# Patient Record
Sex: Female | Born: 1970 | Race: Black or African American | Hispanic: No | Marital: Married | Smoking: Never smoker
Health system: Southern US, Community
[De-identification: ages and names within clinical notes are randomized; demographics above are authoritative.]

## PROBLEM LIST (undated history)

## (undated) DIAGNOSIS — I10 Essential (primary) hypertension: Secondary | ICD-10-CM

## (undated) DIAGNOSIS — T884XXA Failed or difficult intubation, initial encounter: Secondary | ICD-10-CM

## (undated) DIAGNOSIS — F319 Bipolar disorder, unspecified: Secondary | ICD-10-CM

## (undated) DIAGNOSIS — G971 Other reaction to spinal and lumbar puncture: Secondary | ICD-10-CM

## (undated) DIAGNOSIS — F209 Schizophrenia, unspecified: Secondary | ICD-10-CM

## (undated) DIAGNOSIS — R112 Nausea with vomiting, unspecified: Secondary | ICD-10-CM

## (undated) DIAGNOSIS — Z9889 Other specified postprocedural states: Secondary | ICD-10-CM

## (undated) HISTORY — PX: HAND SURGERY: SHX662

## (undated) HISTORY — PX: ANKLE ARTHROSCOPY: SUR85

---

## 1997-11-27 ENCOUNTER — Emergency Department (HOSPITAL_COMMUNITY): Admission: EM | Admit: 1997-11-27 | Discharge: 1997-11-27 | Payer: Self-pay | Admitting: Emergency Medicine

## 1997-12-19 ENCOUNTER — Emergency Department (HOSPITAL_COMMUNITY): Admission: EM | Admit: 1997-12-19 | Discharge: 1997-12-19 | Payer: Self-pay

## 1998-10-11 ENCOUNTER — Emergency Department (HOSPITAL_COMMUNITY): Admission: EM | Admit: 1998-10-11 | Discharge: 1998-10-11 | Payer: Self-pay | Admitting: Emergency Medicine

## 1998-10-25 ENCOUNTER — Emergency Department (HOSPITAL_COMMUNITY): Admission: EM | Admit: 1998-10-25 | Discharge: 1998-10-25 | Payer: Self-pay | Admitting: Emergency Medicine

## 1998-11-09 ENCOUNTER — Encounter: Payer: Self-pay | Admitting: Orthopedic Surgery

## 1998-11-09 ENCOUNTER — Ambulatory Visit (HOSPITAL_COMMUNITY): Admission: RE | Admit: 1998-11-09 | Discharge: 1998-11-09 | Payer: Self-pay | Admitting: Orthopedic Surgery

## 1998-11-18 ENCOUNTER — Other Ambulatory Visit: Admission: RE | Admit: 1998-11-18 | Discharge: 1998-11-18 | Payer: Self-pay | Admitting: Obstetrics and Gynecology

## 1999-01-14 ENCOUNTER — Encounter: Admission: RE | Admit: 1999-01-14 | Discharge: 1999-02-06 | Payer: Self-pay | Admitting: Anesthesiology

## 1999-07-02 ENCOUNTER — Ambulatory Visit (HOSPITAL_COMMUNITY): Admission: RE | Admit: 1999-07-02 | Discharge: 1999-07-02 | Payer: Self-pay | Admitting: Physician Assistant

## 1999-07-02 ENCOUNTER — Encounter: Payer: Self-pay | Admitting: Obstetrics and Gynecology

## 1999-08-04 ENCOUNTER — Inpatient Hospital Stay (HOSPITAL_COMMUNITY): Admission: EM | Admit: 1999-08-04 | Discharge: 1999-08-04 | Payer: Self-pay | Admitting: Obstetrics and Gynecology

## 1999-08-26 ENCOUNTER — Inpatient Hospital Stay (HOSPITAL_COMMUNITY): Admission: EM | Admit: 1999-08-26 | Discharge: 1999-08-31 | Payer: Self-pay | Admitting: Psychiatry

## 1999-09-30 ENCOUNTER — Other Ambulatory Visit: Admission: RE | Admit: 1999-09-30 | Discharge: 1999-10-09 | Payer: Self-pay | Admitting: Psychiatry

## 1999-10-19 ENCOUNTER — Observation Stay (HOSPITAL_COMMUNITY): Admission: RE | Admit: 1999-10-19 | Discharge: 1999-10-20 | Payer: Self-pay | Admitting: Obstetrics and Gynecology

## 2001-02-21 ENCOUNTER — Inpatient Hospital Stay (HOSPITAL_COMMUNITY): Admission: EM | Admit: 2001-02-21 | Discharge: 2001-02-24 | Payer: Self-pay | Admitting: Psychiatry

## 2001-03-08 ENCOUNTER — Encounter (INDEPENDENT_AMBULATORY_CARE_PROVIDER_SITE_OTHER): Payer: Self-pay | Admitting: Specialist

## 2001-03-08 ENCOUNTER — Observation Stay (HOSPITAL_COMMUNITY): Admission: RE | Admit: 2001-03-08 | Discharge: 2001-03-09 | Payer: Self-pay | Admitting: Obstetrics and Gynecology

## 2002-01-30 ENCOUNTER — Encounter: Admission: RE | Admit: 2002-01-30 | Discharge: 2002-01-30 | Payer: Self-pay | Admitting: *Deleted

## 2002-04-20 ENCOUNTER — Encounter: Payer: Self-pay | Admitting: Emergency Medicine

## 2002-04-20 ENCOUNTER — Emergency Department (HOSPITAL_COMMUNITY): Admission: EM | Admit: 2002-04-20 | Discharge: 2002-04-20 | Payer: Self-pay | Admitting: Emergency Medicine

## 2003-07-30 ENCOUNTER — Emergency Department (HOSPITAL_COMMUNITY): Admission: EM | Admit: 2003-07-30 | Discharge: 2003-07-30 | Payer: Self-pay | Admitting: Emergency Medicine

## 2008-08-02 ENCOUNTER — Ambulatory Visit: Payer: Self-pay | Admitting: Family Medicine

## 2008-08-02 ENCOUNTER — Inpatient Hospital Stay (HOSPITAL_COMMUNITY): Admission: EM | Admit: 2008-08-02 | Discharge: 2008-08-03 | Payer: Self-pay | Admitting: Emergency Medicine

## 2010-05-10 LAB — COMPREHENSIVE METABOLIC PANEL
ALT: 30 U/L (ref 0–35)
Alkaline Phosphatase: 27 U/L — ABNORMAL LOW (ref 39–117)
BUN: 12 mg/dL (ref 6–23)
CO2: 22 mEq/L (ref 19–32)
Chloride: 104 mEq/L (ref 96–112)
GFR calc non Af Amer: >60 mL/min (ref >60)
Glucose, Bld: 148 mg/dL — ABNORMAL HIGH (ref 70–99)
Potassium: 3.3 mEq/L — ABNORMAL LOW (ref 3.5–5.1)
Sodium: 136 mEq/L (ref 135–145)
Total Bilirubin: 1 mg/dL (ref 0.3–1.2)
Total Protein: 7.5 g/dL (ref 6.0–8.3)

## 2010-05-10 LAB — DIFFERENTIAL
Basophils Absolute: 0 10*3/uL (ref 0.0–0.1)
Basophils Relative: 0 % (ref 0–1)
Eosinophils Absolute: 0 10*3/uL (ref 0.0–0.7)
Monocytes Relative: 8 % (ref 3–12)
Neutro Abs: 4.3 10*3/uL (ref 1.7–7.7)
Neutrophils Relative %: 59 % (ref 43–77)

## 2010-05-10 LAB — URINALYSIS, ROUTINE W REFLEX MICROSCOPIC
Bilirubin Urine: NEGATIVE
Nitrite: NEGATIVE
Protein, ur: 30 mg/dL — AB
Urobilinogen, UA: 0.2 mg/dL (ref 0.0–1.0)

## 2010-05-10 LAB — URINE MICROSCOPIC-ADD ON

## 2010-05-10 LAB — POCT CARDIAC MARKERS
CKMB, poc: 1.2 ng/mL (ref 1.0–8.0)
Myoglobin, poc: 71.3 ng/mL (ref 12–200)
Troponin i, poc: <0.05 ng/mL (ref 0.00–0.09)

## 2010-05-10 LAB — CBC
HCT: 39.3 % (ref 36.0–46.0)
Hemoglobin: 13.8 g/dL (ref 12.0–15.0)
Platelets: 261 10*3/uL (ref 150–400)
RBC: 4.45 MIL/uL (ref 3.87–5.11)
RDW: 13.2 % (ref 11.5–15.5)
WBC: 7.2 10*3/uL (ref 4.0–10.5)
WBC: 7.6 10*3/uL (ref 4.0–10.5)

## 2010-05-10 LAB — BASIC METABOLIC PANEL
BUN: 10 mg/dL (ref 6–23)
Calcium: 9.2 mg/dL (ref 8.4–10.5)
GFR calc non Af Amer: >60 mL/min (ref >60)
Glucose, Bld: 107 mg/dL — ABNORMAL HIGH (ref 70–99)

## 2010-05-10 LAB — PREGNANCY, URINE: Preg Test, Ur: NEGATIVE

## 2010-06-16 NOTE — Consult Note (Signed)
NAMESHARNIECE, GIBBON              ACCOUNT NO.:  [id number]   MEDICAL RECORD NO.:  [id number]          PATIENT TYPE:  INP   LOCATION:  3034                         FACILITY:  [facility]   PHYSICIAN:  Pramod P. Pearlean Brownie, MD    DATE OF BIRTH:  Jun 27, 1970   DATE OF CONSULTATION:  08/02/2008  DATE OF DISCHARGE:                                 CONSULTATION   REFERRING PHYSICIAN:  Orlene Och, MD   CLINICAL HISTORY:  A 40 year old lady who developed sudden onset of leg  swelling, weak, and numb at 12:30 early this morning.  She tried to see  if the feeling would go away, would eventually she fell asleep.  She  woke later this morning with weakness and tremulousness more on the  right than the left with some perioral numbness as well as pain in the  back of her neck.  The symptoms have persisted since then.  She denies  any slurred speech, dysarthria, diplopia, headache, or vision problems.  She has no prior history of stroke, TIA, or seizures.  She has no  significant vascular risk factors.   She does have a past history of depression with suicidal attempts in  2003 and also history of ovarian fibroids and cyst.   PAST SURGICAL HISTORY:  Ovarian cyst.   REVIEW OF SYSTEMS:  Negative for recent fever, cough, chest pain,  diarrhea, or other illness.   FAMILY HISTORY:  Noncontributory.   PHYSICAL EXAMINATION:  GENERAL:  Anxious young obese African American  lady currently not in distress.  VITAL SIGNS:  Pulse rate 78 per minute and regular, respiratory rate 16  per minute, and distal pulses well felt.  HEAD:  Nontraumatic.  NECK:  Supple without bruit.  ENT:  Unremarkable.  CARDIAC:  Regular heart sounds.  No murmur or gallop.  LUNGS:  Clear to auscultation.  NEUROLOGIC:  She is pleasant, awake, alert, cooperative.  She appears  anxious.  She follows commands well.  Speech is normal and is not  pressured.  There is no aphasia or slurred speech.  Eye movements are  full range without  nystagmus.  Face is  symmetric.  Tongue is midline.  Motor system exam reveal no upper extremity drift; however, her effort  is extremely variable and there is mild subjective weakness on the  right, but this is not very consistent.  Fine finger movements are  symmetric.  She has mild finger-to-nose and knee-to-heel coordination  impairment on the right, but I doubt her effort.  She has mild action  tremor, which is very fine of both upper extremities, right more than  left as well as to a lesser extent the lower extremities.  Her gait was  not tested.   DATA REVIEW:  CT scan of the head done today reveals no acute  abnormalities.  CT angiogram of cranial neck reveal no evidence of  dissection or large vessel stenosis.   IMPRESSION:  A 40 year old lady with subjective weakness and tremors  more on the right than the left.  Of unclear etiology, I doubt this  presents a stroke and we will  check MRI scan to brain and cervical spine  to rule out  any brain lesions or compressive spinal cord lesion.  If this is ruled  out, the patient will be discharged after that.  I suspect she has  underlying psychosocial issues, which may be playing a role.  I  discussed with the patient and the boyfriend and answered questions.  The patient has been admitted to the medical service for the above  workup.           ______________________________  Sunny Schlein. Pearlean Brownie, MD     PPS/MEDQ  D:  08/02/2008  T:  08/03/2008  Job:  [id number]

## 2010-06-16 NOTE — H&P (Signed)
NAMEACASIA, SKILTON NO.:  [id number]   MEDICAL RECORD NO.:  [id number]          PATIENT TYPE:  INP   LOCATION:  3034                         FACILITY:  [facility]   PHYSICIAN:  Nestor Ramp, MD        DATE OF BIRTH:  Oct 23, 1970   DATE OF ADMISSION:  08/02/2008  DATE OF DISCHARGE:                              HISTORY & PHYSICAL   PRIMARY CARE PHYSICIAN:  Unassigned, the patient sees Dr. Charolette Forward  in [place], [place].   NEUROLOGIST:  Dr. Darlyne Russian in [place].   CHIEF COMPLAINT:  Right-sided weakness.   HISTORY OF PRESENT ILLNESS:  This is a 40 year old female presenting  with right-sided weakness.  She awoke from her sleep at 12:45 a.m. this  morning and could not turn over in bed because she felt weak in her arms  and legs.  She could move her arms at that time, but both arms felt  heavy.  She went back to sleep at that time but awoke to her alarm at  6:30 a.m. and could not get up out of bed because she could not move her  right arm and leg.  She also noted pins and needles feeling in her  right arm and leg.  She was able to move her left extremities, though  there were subjectively weak at that time.  She also reports periorbital  tingling, and head and neck trembling with neck pain at that time.  She  denies dysarthria.  She feels better now but still endorses some  weakness.  She reports no previous similar episodes.   REVIEW OF SYSTEMS:  Per HPI and negative for nausea, vomiting, diarrhea,  chest pain, shortness of breath, headache, fever, chills, dysuria,  urinary frequency, abdominal pain.  The patient is menstruating at this  time.   ALLERGIES:  No known drug allergies.   MEDICATIONS:  1. Triamterene and hydrochlorothiazide (75/50) 1 tablet p.o. daily.  2. Potassium chloride 10 mEq p.o. t.i.d.  3. Prozac 40 mg p.o. daily.  4. Temazepam 30 mg p.o. at bedtime.  5. Toprol-XL 100 mg p.o. daily.   PAST MEDICAL  HISTORY:  1. Concussion with seizure in 2004.  No subsequent seizures.  2. Headache, post concussive.  Her headaches occur approximately every      4 months.  3. Hypertension.  4. Depression.   FAMILY HISTORY:  Mother is alive with hypertension, diabetes and asthma.  Father is alive with diabetes, prostate cancer and coronary artery  disease with his first MI at age 58.  Siblings are alive and well.  There are no children.   SOCIAL HISTORY:  The patient lives alone in [place], [place],  is a Consulting civil engineer at [employer].  She uses alcohol  occasionally but denies any history of tobacco or illicit drug use.   PHYSICAL EXAMINATION:  VITAL SIGNS:  Temperature is 99.0, heart rate is  101-112, respirations 18-20, blood pressure 122-149/76-95, oxygen  saturation 97-98% on room air.  GENERAL:  She is alert and oriented and in no acute distress.  HEENT:  Extraocular muscles are intact and pupils are equally round and  reactive to light with moist mucous membranes.  CARDIO:  Regular rate and rhythm with no murmurs, rubs or gallops.  PULMONARY:  Clear to auscultation bilaterally with normal work of  breathing.  ABDOMEN:  She is obese but it is soft, nontender with normoactive bowel  sounds.  EXTREMITIES:  Nontender without edema.  NEUROLOGIC:  Cranial nerves II-XII are intact.  A 5/5 strength in all  four extremities decreased, but equal deep tendon reflexes in all four  extremities.   LABORATORY DATA AND RADIOLOGY:  CT of the head shows slight asymmetry in  size of cavernous and supraclinoid right internal artery without  evidence for focal stenosis or dissection, no acute intracranial  abnormality.  CTA of the neck shows a motion artifact at proximal right  internal carotid artery, focal stenosis may be present but it is  considered unlikely.  Correlation with carotid Doppler study maybe of  use for further evaluation.  Alternatively, conventional arteriogram   could be performed.  Artifactual filling defects in the left vertebral  artery and no vertebral lesions are identified.  Normal appearance of  the left carotid artery.   Sodium 136, potassium 3.3, chloride 104, bicarb 22, BUN 12, creatinine  0.84, glucose 148, calcium 9.2.  White blood count 7.2, hemoglobin 13.8,  hematocrit 39.3, platelets 264.  Total bilirubin 1.0, alkaline  phosphatase 27, AST 26, ALT 30, protein 7.5, albumin 4.1.  Point-of-care  cardiac enzymes are negative x1.  Urine pregnancy test is negative.  Urinalysis is significant for a specific gravity greater than 1.046,  ketones of 15, large blood and 30 protein, it is otherwise within normal  limits.   ASSESSMENT AND PLAN:  This is a 40 year old female presenting with right-  sided weakness.  1. Right-sided weakness.  No focal deficits on exam.  Normal CT of the      head and left carotid, right carotid with motion artifact.  MRI/MRA      is pending at this time.  Consider TIA versus localized seizure.      Neuro has been consulted and the plan will be per their      recommendations.  2. Hypokalemia.  Likely secondary to hydrochlorothiazide, as she is on      a very large dose at home.  She is on triamterene and potassium      supplement at home.  Replete potassium now and as necessary.  Check      daily electrolytes.  3. Depression.  Continue home dose of Prozac and Restoril.  4. Hypertension.  Blood pressure is elevated, continue home dose of      hydrochlorothiazide and triamterene as well as Toprol-XL.  5. Fluids, electrolytes, nutrition/gastrointestinal.  Heart healthy,      low-sodium diet.  Hep-Lock the IV fluids.  Urinalysis is consistent      with dehydration (ketones and high specific gravity), but the      patient appears well hydrated on clinical exam.  She did receive IV      fluids in the emergency department.  6. Hematuria likely spurious as the patient is menstruating.  7. Prophylaxis.  Heparin  8.  Disposition.  Pending workup of weakness.      Romero Belling, MD  Electronically Signed      Nestor Ramp, MD  Electronically Signed    MO/MEDQ  D:  08/03/2008  T:  08/03/2008  Job:  [id number]

## 2010-06-16 NOTE — Discharge Summary (Signed)
[name], Jenna Hill NO.:  [id number]   MEDICAL RECORD NO.:  [id number]          PATIENT TYPE:  INP   LOCATION:  3034                         FACILITY:  [facility]   PHYSICIAN:  Nestor Ramp, MD        DATE OF BIRTH:  1970-08-31   DATE OF ADMISSION:  08/02/2008  DATE OF DISCHARGE:  08/03/2008                               DISCHARGE SUMMARY   DISCHARGE DIAGNOSES:  1. Right-sided weakness.  2. Hypokalemia.  3. Depression.  4. Hypertension.   DISCHARGE MEDICATIONS:  1. Maxzide (75/50) 1 tablet p.o. daily.  2. Potassium chloride 10 mEq p.o. t.i.d.  3. Prozac 40 mg p.o. daily.  4. Restoril 30 mg p.o. at bedtime.  5. Toprol-XL 100 mg p.o. daily.   Note, there are no changes to the patient's medication list during this  hospitalization.   CONSULTS:  Neurology.   PROCEDURES:  A CT angiogram of the head and neck showed no acute  intracranial abnormality, but there was some motion artifact of this  study.   An MRA of the head and neck with and without contrast; and brain, with  and without contrast showed a motion degraded exam, but no acute infarct  and no abnormal intracranial enhancing lesions.  It did demonstrate flow  within portions of the internal carotid arteries and vertebral arteries.   SIGNIFICANT LABORATORIES:  Point of care cardiac enzymes are negative.  Urine pregnancy test is negative.  Urinalysis is significant for a  specific gravity greater than 1.046, ketones of 15, large blood and 30  protein.  Notably, the patient is menstruating at this time, which may  explain the blood in the urine.  She has no abdominal symptoms.   On day of discharge, the patient's CBC is within normal limits with a  white blood count of 7.6, a hemoglobin of 13.2, and a platelet count of  261.  Her basic metabolic panel likewise is within normal limits with  exception of a glucose of 107.  Her creatinine is normal at 0.86.   BRIEF HOSPITAL COURSE:  This is a 40 year old  female who is admitted  with right-sided weakness less than a day in duration.  She was  evaluated by the neurology team with studies as above.  At this time,  there is no clear cause for her symptoms.  They are attributed to just  transient symptoms that are possibly nonorganic and we are advised by  neuro to discharge the patient to home with no further workup.   1. Hypokalemia.  The patient was repleted with potassium for a initial      value of 3.3, and is normokalemic to 3.5 on discharge.  She is to      resume her home potassium supplementation and her triamterene.  2. Depression.  We continued the home dose of Prozac and Restoril.  3. Hypertension.  It was normal to moderately elevated during this      admission and the patient is continued on her home dose of      hydrochlorothiazide and triamterene with Toprol-XL.   DISCHARGE CONDITION:  The patient is discharged to home in stable  medical condition.  She is advised to follow up with her primary  neurologist, Dr. Everardo Beals in [place], [place] and her  primary care physician Dr. Manson Passey, [place].  The follow up should be within a week.      Romero Belling, MD  Electronically Signed      Nestor Ramp, MD  Electronically Signed    MO/MEDQ  D:  08/03/2008  T:  08/04/2008  Job:  [id number]

## 2010-06-19 NOTE — Discharge Summary (Signed)
[facility]  Patient:    Jenna Hill                       MRN: [id number] Adm. Date:  [id number] Disc. Date: [id number] Attending:  Marlyn Corporal Fabmy                           Discharge Summary  ADMISSION DIAGNOSES: Axis I:    1. Major depression, recurrent with suicidal ideas.            2. Post-traumatic stress disorder with some psychotic symptoms. Axis II:   Deferred. Axis III:  Hypertension. Axis IV:   Severe, social environment. Axis V:    Global assessment of functioning of 35 on admission and highest in            the past year being 70.  DISCHARGE DIAGNOSES: Axis I:    1. Major depression, recurrent with suicidal preoccupation.            2. Post-traumatic stress disorder            3. Psychotic disorder, not otherwise specified. Axis II:   Deferred. Axis III:  Hypertension. Axis IV:   Severe, to her social environment. Axis V:    Global assessment of functioning of 35 on admission and 60            on discharge.  BRIEF HISTORY:  This was the second admission for this 40 year old separated black female who has been feeling depressed for two weeks.  The patient came under psychiatric care at age 89 when she was hospitalized for three weeks. It was in [place].  Cannot remember what medication was prescribed at the time.  Precipitating stress, feeling bad, hospitalization with an argument she was having with her mother, following which she had attempted suicide by overdosing.  She has been stable emotionally since then until two weeks ago when she felt depressed.  Present depression precipitated by her mother leaving after a brief visit . The patient became melancholy and her depression escalated.  On the day prior to admission she had been seriously contemplating suicide.  At the time of her admission, she denied any psychotic symptoms.  She states she is afraid to drive or to talk to anybody.  She did become mildly paranoid in  that she would live by herself but started feeling the presence of others in the house, including her brother who died in an accident in 20.  PERTINENT PHYSICAL AND LABORATORY DATA:  The patients family physician is Dr. Heather Roberts.  Patient is hypertensive and her blood pressure on admission was 134/84.  She was maintained on Maxzide 1 daily throughout her hospitalization and her vital signs were acceptable and within normal range throughout.  Laboratory data included a urine drug screen which was positive for benzodiazepines.  Urinalysis was unremarkable.  Urine was turbid but was negative for leukocyte esterase.  Otherwise, the patient had been cleared physically by Dr. Orson Aloe prior to having been referred for psychiatric care.  HOSPITAL COURSE:  The patient was placed on a combination of Prozac 20 mg once daily.  Zyprexa 10 mg h.s. and lorazepam 1 mg 1/2 to 1 t.i.d. p.r.n.  In response the medication she did better, but continued to be emotionally withdrawn and avoid groups because she felt uncomfortable.  She talked about seeing shadows.  The patient tolerated her medication well and with some  benefit.  CONDITION ON DISCHARGE:  The patient slept well the night before discharge She was less paranoid but was still anxious.  She did not participate in groups because she claimed there were too many people in the groups.  However, she was not homicidal or suicidal and since she was not at risk, I felt she could be discharged and followed as an outpatient at partial hospitalization.  MEDICATION AND FOLLOW-UP:  The patient is discharge with prescriptions for Prozac 20 mg daily.  Zyprexa 10 mg h.s. and lorazepam 1 mg 1/2 to 1 t.i.d. She was advised not to drink any alcoholic beverages.  She was advised to call if her depression escalated or if she becomes psychotic. DD:  09/30/99 TD:  09/30/99 Job: [id number]

## 2010-06-19 NOTE — Op Note (Signed)
[facility]  Patient:    Jenna Hill, Jenna Hill Visit Number: [id number] MRN: [id number]          Service Type: DSU Location: [id number] Attending Physician:  Soledad Gerlach Dictated by:   Guy Sandifer Arleta Creek, M.D. Proc. Date: 03/08/01 Admit Date:  03/08/2001 Discharge Date: 03/09/2001                             Operative Report  PREOPERATIVE DIAGNOSES:       1. Ovarian cyst.                               2. Pelvic pain.                               3. Abnormal uterine bleeding.  POSTOPERATIVE DIAGNOSES:      1. Endometrial polyp.                               2. Pelvic adhesions.                               3. Uterine leiomyomata.  PROCEDURE:                    Laparoscopy with lysis of adhesions and ablation of uterine leiomyomata, hysteroscopy, dilatation and curettage with removal of endometrial polyp.  SURGEON:                      Guy Sandifer. Arleta Creek, M.D.  ANESTHESIA:                   General with endotracheal intubation.  ESTIMATED BLOOD LOSS:         Less than 50 cc.  INPUT AND OUTPUT:             Sorbitol distending media 20 cc deficit.  INDICATIONS AND CONSENT:      This patient is a 40 year old married black female with a persistent right ovarian cyst and abnormal uterine bleeding and pelvic pain as detailed in the history and physical.  Laparoscopy and hysteroscopy is discussed with the patient.  Potential risks and complications have been discussed including, but not limited to, infection, uterine perforation, bowel, bladder, ureteral damage, bleeding requiring transfusion of blood products with possible transfusion reaction, HIV and hepatitis acquisition, DVT, PE, pneumonia, hysterectomy, and laparotomy.  All questions have been answered and consent is signed on the chart.  FINDINGS:                     The endometrial cavity contains a 1-2 cm polypoid structure arising from the left posterior endometrial canal.   The right fallopian tube ostia is normal.  The left fallopian tube ostia is identified, but contains some small ciliary structures.  The remainder of the cavity is without abnormal vessels or structures.  Abdominally the upper abdomen is normal.  The appendix is normal.  The uterus is mildly enlarged. There is a 5 mm subserosal leiomyoma on the right anterior uterine fundus and a 1 cm subserosal fibroid on the left posterior lower uterine segments.  The right ovary is normal in appearance.  There is a prominent venous plexus below the ovary at the point where it joins the mesosalpinx.  The ovary itself is without apparent lesion.  The right fallopian tube fimbria are delicate and regular.  The left ovary is normal.  The left fimbria are somewhat blunted. There is no obvious evidence of endometriosis in the posterior cul-de-sac, pelvic side walls, or anterior cul-de-sac.  PROCEDURE:                    Patient is taken to the operating room, placed in the dorsal supine position where general anesthesia is induced via endotracheal intubation.  She was then placed in the dorsal lithotomy position where she is prepped abdominally and vaginally, bladder straight catheterized, and she is draped in a sterile fashion.  Bivalve speculum was placed in the vagina and anterior cervical lip is injected with 1% Xylocaine and grasped with a single tooth tenaculum.  A paracervical block is then placed with 1% Xylocaine at the 2, 4, 5, 7, 8, and 10 oclock positions with 1% Xylocaine. Cervix is gently progressively dilated to a 21 Pratt dilator.  Diagnostic hysteroscope is placed in the cervical canal and advanced under direct visualization using Sorbitol distending media.  The above findings are noted. The hysteroscope is withdrawn.  Randall Stone forceps are then placed in the endometrial cavity and the polyp is easily withdrawn.  Sharp curettage is carried out.  Reinspection with the hysteroscope reveals  that the polyp has been removed in its entirety and no other abnormal structures are again noted. The single tooth tenaculum is replaced with the Hulka tenaculum and attention is turned to the abdomen.  A small infraumbilical incision is made and the 10-11 disposable trocar sleeve was placed on the first attempt without difficulty.  Placement is verified with the laparoscope and no damage to the surrounding structures is noted.  Pneumoperitoneum is induced and a small suprapubic incision is made and a 5 mm nondisposable trocar sleeve is placed under direct visualization without difficulty.  The above findings are noted. It should also be noted there was a band of adhesion from the epiploicae of the bowel wrapping around the left utero-ovarian ligament to the posterior cul-de-sac.  This came down bluntly without difficulty.  Good hemostasis was noted.  The subserosal leiomyoma are cauterized with bipolar cautery.  Some filmy changes along the left uterosacral ligament which are not clearly consistent with endometriosis are superficially cauterized.  This is well away from the course of the left ureter.  The suprapubic trocar sleeve is then removed.  Pneumoperitoneum is reduced and no bleeding is noted from any site. Pneumoperitoneum is completely reduced.  A 2-0 Vicryl suture is used to close the subcutaneous layers of the umbilical incision with a single stitch.  Great care is taken not to pick up any underlying structures.  The incisions are injected with 0.5% plain Marcaine.  The skin incisions are then closed with Dermabond.  All counts are correct.  Hulka tenaculum is removed and no bleeding is noted.  The patient is taken to the recovery room in stable condition. Dictated by:   Guy Sandifer Arleta Creek, M.D. Attending Physician:  Soledad Gerlach DD:  03/08/01 TD:  03/09/01 Job: [id number]

## 2010-06-19 NOTE — H&P (Signed)
[facility]  Patient:    Jenna Hill, Jenna Hill Visit Number: [id number] MRN: [id number]          Service Type: Attending:  Guy Sandifer. Arleta Creek, M.D. Dictated by:   Guy Sandifer Arleta Creek, M.D. Adm. Date:  03/08/01                           History and Physical  CHIEF COMPLAINT:              Right ovarian cyst and abnormal uterine bleeding.  HISTORY OF PRESENT ILLNESS:   This patient is a 40 year old married black female, G4, P0, with a history of pelvic pain and pelvic abdominal adhesions and a questionable history of endometriosis, who underwent ultrasound on December 05, 2000, for pelvic pain.  At that time the uterus measured 7.5 x 3.85 x 4.74 cm with one intramural fibroid noted measuring 1.7 cm in largest dimension.  She was also found to have a 3.2-cm cystic structure consistent with possible endometrioma versus hemorrhagic cyst.  The cyst remained present on two successive ultrasounds, the last one being on March 01, 2001, where it again was noted.  She continues to have some pelvic pain.  She reportedly had endometriosis on laparoscopies in the past.  On laparoscopy in September 2001, she was noted to have pelvic adhesions. Finally, she has reportedly failed to have withdrawal bleeding from Provera in the recent months.  Thyroid studies and prolactin level were normal in October of 2001.  Pregnancy tests have been repeatedly negative.  She is without vasomotor symptoms.  She is being admitted for hysteroscopy, D&C and laparoscopy with possible ovarian cystectomy and possible unilateral salpingo-oophorectomy.  PAST MEDICAL HISTORY: 1. Depression with recent admission to the [facility].  Her    psychiatrist, Dr. Jamas Lav, fells that she is suitable for surgery    from her standpoint and is capable of giving informed consent. 2. Endometriosis as above. 3. History of hypertension. 4. Blood transfusion in 1995.  PAST SURGICAL  HISTORY:        Laparoscopy x3.  OBSTETRIC HISTORY:            Pregnancy termination x2.  Spontaneous miscarriage x2.  FAMILY HISTORY:               Multiple births in maternal grandmother and brothers, diabetes in father, stomach cancer in maternal grandmother, heart disease in father.  SOCIAL HISTORY:               The patient denies tobacco, alcohol or drug abuse.  MEDICATIONS: 1. Maxzide 75/50. 2. Seroquel 200 mg. 3. Ambien 10 mg. 4. Paxil CR 12.5 mg. 5. Risperdal 3 mg.  ALLERGIES:                    No known drug allergies.  REVIEW OF SYSTEMS:            Negative except as above.  PHYSICAL EXAMINATION:  VITAL SIGNS:                  Height 5 feet 4 inches, blood pressure 124/90.  HEENT:                        Without thyromegaly.  LUNGS:                        Clear to auscultation.  HEART:                        Regular rate and rhythm.  BACK:                         Without CVA tenderness.  BREASTS:                      With fibrocystic change without dominant mass, retraction or discharge.  ABDOMEN:                      Soft, nontender, without masses.  PELVIC EXAM:                  Vulva, vagina and cervix without lesion.  Uterus is normal size.  Right adnexa tender without palpable masses, left adnexa nontender without masses.  EXTREMITIES:                  Grossly within normal limits.  NEUROLOGICAL EXAM:            Grossly within normal limits.  ASSESSMENT: 1. Abnormal uterine bleeding. 2. Pelvic pain. 3. Right ovarian cyst.  PLAN:                         Laparoscopy, possible ovarian cystectomy, possible unilateral salpingo-oophorectomy, hysteroscopy, D&C. Dictated by:   Guy Sandifer Arleta Creek, M.D. Attending:  Guy Sandifer Arleta Creek, M.D. DD:  03/08/01 TD:  03/08/01 Job: [id number]

## 2010-06-19 NOTE — Op Note (Signed)
[facility]  Patient:    Jenna Hill, Jenna Hill                      MRN: [id number] Proc. Date: 10/19/99 Adm. Date:  [id number] Attending:  Cordelia Pen Ii                           Operative Report  PREOPERATIVE DIAGNOSIS:       Pelvic pain.  POSTOPERATIVE DIAGNOSIS:      Pelvic/abdominal adhesions.  PROCEDURE:                    Laparoscopy with lysis of adhesions and                               chromopertubation of fallopian tubes.  SURGEON:                      Guy Sandifer. Arleta Creek, M.D.  ANESTHESIA:                   General with endotracheal intubation.  ESTIMATED BLOOD LOSS:         Drops.  INDICATIONS/CONSENT:          This patient is a 40 year old black female with pelvic pain.  Details are dictated in the history and physical.  A laparoscopy with chromopertubation is discussed with the patient.  The possible risks and complications have been discussed including, but not limited to, infection, bowel, bladder, or ureteral damage, bleeding requiring transfusion of blood products with possible transfusion reaction, HIV, and hepatitis acquisition, DVT, and PE, and pneumonia.  All questions are answered and consent is signed and on the chart.  FINDINGS:                     There are two omental adhesions to the anterior abdominal wall above the level of the umbilicus, below the level of the liver. This forms a closed loop.  There is an adhesion from the left vesicouterine fold around the fallopian tube and utero-ovarian ligament.  The ovaries have a smooth capsule.  The fallopian tubes appear normal bilaterally.  The anterior and posterior cul-de-sacs are normal.  DESCRIPTION OF PROCEDURE:     The patient is taken to the operating room and placed in the dorsal supine position, where general anesthesia is induced via endotracheal intubation.  She was then placed in the dorsal lithotomy position where she is prepped abdominally and  vaginally.  A Foley catheter is used to drain the bladder.  The cervix is grasped with a single-tooth tenaculum.  An acorn tenaculum is then placed in the cervix for chromopertubation.  She is draped in a sterile fashion.  A small infraumbilical incision is made, and a 1011 disposable trocar sleeve is placed, and after elevating the anterior abdominal wall with towel clips on either side of the umbilicus.  Proper placement is verified and no damage to the surrounding structures is noted. Towel clips are removed.  The pneumoperitoneum is induced.  A small suprapubic incision is made and a 5 mm nondisposable trocar sleeve is placed under direct visualization without difficulty.  The above findings are noted.  The adhesions in the pelvis are first cauterized with bipolar cautery close to the insertion, and then taken down sharply.  Then the omental adhesions to the  anterior abdominal wall are again cauterized with bipolar cautery at their insertion onto the anterior abdominal wall and taken down sharply.  Good hemostasis is maintained.  During this process, there is a crack that develops in the umbilical disposable trocar sleeve.  The isufflator is then moved to the suprapubic trocar sleeve, and umbilical trocar sleeve is removed.  A new 1011 disposable trocar sleeve is placed bluntly without cocking the sleeve. Placement is again verified and then insufflation is resume through the umbilical trocar sleeve.  Irrigation is carried out.  Chromopertubation of the fallopian tubes is then done.  The right fallopian tube has prompt fill and spill.  There is no apparent fill on the left side.  Excess fluid is removed. The inferior trocar sleeve is removed.  The pneumoperitoneum is reduced and no bleeding is noted from any site.  The pneumoperitoneum is then completely reduced and the umbilical trocar sleeve is removed.  The umbilical incision is first closed with a #0 Vicryl suture in the deeper  underlying layers, with care being taken not to pick up any underlying structure.  The skin incisions are closed with subcuticular #3-0 Vicryl suture.  The incisions are injected with 0.5% plain Marcaine.  Dressings are applied.  The single-tooth and acorn tenaculums are removed, and no bleeding is noted.  All counts are correct.  The patient is awakened and taken to the recovery room in stable condition. DD:  10/19/99 TD:  10/20/99 Job: [id number]

## 2010-06-19 NOTE — Discharge Summary (Signed)
[facility]  Patient:    Jenna Hill, Jenna Hill Visit Number: [id number] MRN: [id number]          Service Type: DSU Location: [id number] Attending Physician:  Soledad Gerlach Dictated by:   Guy Sandifer Arleta Creek, M.D. Admit Date:  03/08/2001 Discharge Date: 03/09/2001                             Discharge Summary  ADMISSION DIAGNOSES:          1. Ovarian cyst.                               2. Pelvic pain.                               3. Abnormal uterine bleeding.  DISCHARGE DIAGNOSES:          1. Endometrial polyp.                               2. Pelvic adhesions.                               3. Uterine leiomyomata.  PROCEDURES:                   On March 08, 2001, laparoscopy with lysis of adhesions, ablation of uterine leiomyomata, hysterectomy, and dilatation and curettage with removal of endometrial polyp.  REASON FOR ADMISSION:         This patient is a 40 year old, married, black female, G4, P0, with pelvic pain and ovarian cyst detailed in the history and physical.  She was admitted for the above procedure.  HOSPITAL COURSE:              She underwent the above procedure without complication.  She was kept in an observation bed secondary to not having a ride home or anyone to help in the first hours after surgery.  On the evening of surgery, she had good pain control and tolerating a regular diet.  The vital signs were stable and she was afebrile.  On the day of discharge, she again was afebrile with stable vital signs, tolerating a regular diet, and ambulating well with good pain control.  CONDITION ON DISCHARGE:       Good.  DIET:                         Regular as tolerated.  ACTIVITY:                     No lifting.  No vaginal entry.  FOLLOW-UP:                    In the office in two weeks.  DISCHARGE MEDICATIONS:        1. Vicodin, #10, one to two p.o. q.6h. p.r.n.                               2. Ibuprofen 800 mg  p.o. q.8h. p.r.n. Dictated by:   Guy Sandifer Arleta Creek, M.D. Attending Physician:  Cordelia Pen  Ii DD:  03/09/01 TD:  03/10/01 Job: [id number]

## 2012-03-18 ENCOUNTER — Emergency Department (HOSPITAL_COMMUNITY): Payer: Self-pay

## 2012-03-18 ENCOUNTER — Encounter (HOSPITAL_COMMUNITY): Payer: Self-pay | Admitting: Emergency Medicine

## 2012-03-18 ENCOUNTER — Emergency Department (HOSPITAL_COMMUNITY)
Admission: EM | Admit: 2012-03-18 | Discharge: 2012-03-18 | Disposition: A | Discharge status: Home / Self Care | Payer: Self-pay | Attending: Emergency Medicine | Admitting: Emergency Medicine

## 2012-03-18 DIAGNOSIS — Y9389 Activity, other specified: Secondary | ICD-10-CM | POA: Insufficient documentation

## 2012-03-18 DIAGNOSIS — Y9289 Other specified places as the place of occurrence of the external cause: Secondary | ICD-10-CM | POA: Insufficient documentation

## 2012-03-18 DIAGNOSIS — W19XXXA Unspecified fall, initial encounter: Secondary | ICD-10-CM

## 2012-03-18 DIAGNOSIS — Z7982 Long term (current) use of aspirin: Secondary | ICD-10-CM | POA: Insufficient documentation

## 2012-03-18 DIAGNOSIS — I10 Essential (primary) hypertension: Secondary | ICD-10-CM | POA: Insufficient documentation

## 2012-03-18 DIAGNOSIS — S79929A Unspecified injury of unspecified thigh, initial encounter: Secondary | ICD-10-CM | POA: Insufficient documentation

## 2012-03-18 DIAGNOSIS — W010XXA Fall on same level from slipping, tripping and stumbling without subsequent striking against object, initial encounter: Secondary | ICD-10-CM | POA: Insufficient documentation

## 2012-03-18 DIAGNOSIS — S0990XA Unspecified injury of head, initial encounter: Secondary | ICD-10-CM

## 2012-03-18 DIAGNOSIS — Z79899 Other long term (current) drug therapy: Secondary | ICD-10-CM | POA: Insufficient documentation

## 2012-03-18 DIAGNOSIS — W1809XA Striking against other object with subsequent fall, initial encounter: Secondary | ICD-10-CM | POA: Insufficient documentation

## 2012-03-18 DIAGNOSIS — M25551 Pain in right hip: Secondary | ICD-10-CM

## 2012-03-18 DIAGNOSIS — S79919A Unspecified injury of unspecified hip, initial encounter: Secondary | ICD-10-CM | POA: Insufficient documentation

## 2012-03-18 HISTORY — DX: Essential (primary) hypertension: I10

## 2012-03-18 MED ORDER — NAPROXEN 500 MG PO TABS: 500.0000 mg | ORAL_TABLET | Freq: Two times a day (BID) | ORAL | Status: DC

## 2012-03-18 MED ORDER — CYCLOBENZAPRINE HCL 10 MG PO TABS: 10.0000 mg | ORAL_TABLET | Freq: Two times a day (BID) | ORAL | Status: DC | PRN

## 2012-03-18 NOTE — ED Provider Notes (Signed)
History     CSN: [id number]  Arrival date & time 03/18/12  1744   First MD Initiated Contact with Patient 03/18/12 1812      Chief Complaint  Patient presents with  . .right sided pain, hit head\     (Consider location/radiation/quality/duration/timing/severity/associated sxs/prior treatment) HPI.... accidental fall last night on ice. Complains of pain in right hip and right inguinal area along with frontal forehead.  No loss of consciousness, neuro deficits, neck pain. Patient is ambulatory. Severity is mild to moderate. Palpation makes pain worse  Past Medical History  Diagnosis Date  . Hypertension     History reviewed. No pertinent past surgical history.  History reviewed. No pertinent family history.  History  Substance Use Topics  . Smoking status: Never Smoker   . Smokeless tobacco: Not on file  . Alcohol Use: Yes    OB History   Grav Para Term Preterm Abortions TAB SAB Ect Mult Living                  Review of Systems  All other systems reviewed and are negative.    Allergies  Codeine; Other; and Latex  Home Medications   Current Outpatient Rx  Name  Route  Sig  Dispense  Refill  . aspirin EC 325 MG tablet   Oral   Take 650 mg by mouth once.         . metoprolol succinate (TOPROL-XL) 100 MG 24 hr tablet   Oral   Take 100 mg by mouth every morning. Take with or immediately following a meal.         . potassium chloride (K-DUR) 10 MEQ tablet   Oral   Take 10 mEq by mouth 3 (three) times daily.         Marland Kitchen triamterene-hydrochlorothiazide (MAXZIDE) 75-50 MG per tablet   Oral   Take 1 tablet by mouth every morning.         . cyclobenzaprine (FLEXERIL) 10 MG tablet   Oral   Take 1 tablet (10 mg total) by mouth 2 (two) times daily as needed for muscle spasms.   20 tablet   0   . naproxen (NAPROSYN) 500 MG tablet   Oral   Take 1 tablet (500 mg total) by mouth 2 (two) times daily.   30 tablet   0     BP 144/99  Pulse 90   Temp(Src) 98.3 F (36.8 C) (Oral)  Resp 20  Ht 5\' 3"  (1.6 m)  Wt 180 lb (81.647 kg)  BMI 31.89 kg/m2  SpO2 100%  LMP 03/11/2012  Physical Exam  Nursing note and vitals reviewed. Constitutional: She is oriented to person, place, and time. She appears well-developed and well-nourished.  HENT:  Head: Normocephalic.  1 x 1 cm abrasion mid forehead  Eyes: Conjunctivae and EOM are normal. Pupils are equal, round, and reactive to light.  Neck: Normal range of motion. Neck supple.  Cardiovascular: Normal rate, regular rhythm and normal heart sounds.   Pulmonary/Chest: Effort normal and breath sounds normal.  Abdominal: Soft. Bowel sounds are normal.  Musculoskeletal: Normal range of motion.  Minimal tenderness right posterior pelvis and right inguinal area  Neurological: She is alert and oriented to person, place, and time.  Skin: Skin is warm and dry.  Psychiatric: She has a normal mood and affect.    ED Course  Procedures (including critical care time)  Labs Reviewed - No data to display Dg Pelvis 1-2 Views  03/18/2012  *  RADIOLOGY REPORT*  Clinical Data: Fall, pain  PELVIS - 1-2 VIEW  Comparison:  None.  Findings:  There is no evidence of pelvic fracture or diastasis. No other pelvic bone lesions are seen.  IMPRESSION: Negative.   Original Report Authenticated By: Davonna Belling, M.D.    Ct Head Wo Contrast  03/18/2012  *RADIOLOGY REPORT*  Clinical Data: The patient slipped on ice and hit forehead.  No loss of consciousness.  CT HEAD WITHOUT CONTRAST  Technique:  Contiguous axial images were obtained from the base of the skull through the vertex without contrast.  Comparison: MRI brain 08/02/2008.  Findings: There is no evidence for acute infarction, intracranial hemorrhage, mass lesion, hydrocephalus, or extra-axial fluid. There is no atrophy or white matter disease.  There is no significant scalp hematoma but there does appear to be a small subcutaneous 3 mm radiopaque density over the  right forehead in a paramedian location (image 17 series 2.  Correlate clinically.  It is unclear if this corresponds to the recent trauma or is preexisting. No susceptibility abnormality was seen on prior MR from 2010.  Calvarium is intact.  The sinuses and mastoids are clear. 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  IMPRESSION: No skull fracture or intracranial hemorrhage.  Tiny radiopaque density subcutaneously within the right forehead. It is unclear if this is preexisting or represents an acute foreign body.   Original Report Authenticated By: Davonna Belling, M.D.      1. Fall   2. Minor head injury   3. Right hip pain       MDM  Plain films of pelvis and CT head negative for fracture.  There is a possible radiopaque density in the right forehead.  Uncertain clinical correlation with fall last night.  This was discussed with the patient. She understands that we would not retrieve this tonight.        Donnetta Hutching, MD 03/18/12 [phone]

## 2012-03-18 NOTE — ED Notes (Signed)
Patient slipped on ice yesterday- hit forehead (negative LOC), right arm, right rib cage, right hand, and right hip.  Patient states she feels lethargic.  Patient is A/O x 4.

## 2012-03-20 ENCOUNTER — Emergency Department (HOSPITAL_COMMUNITY)
Admission: EM | Admit: 2012-03-20 | Discharge: 2012-03-20 | Disposition: A | Discharge status: Home / Self Care | Payer: Self-pay | Attending: Emergency Medicine | Admitting: Emergency Medicine

## 2012-03-20 ENCOUNTER — Emergency Department (HOSPITAL_COMMUNITY): Payer: Self-pay

## 2012-03-20 DIAGNOSIS — IMO0002 Reserved for concepts with insufficient information to code with codable children: Secondary | ICD-10-CM | POA: Insufficient documentation

## 2012-03-20 DIAGNOSIS — S0990XA Unspecified injury of head, initial encounter: Secondary | ICD-10-CM | POA: Insufficient documentation

## 2012-03-20 DIAGNOSIS — W010XXA Fall on same level from slipping, tripping and stumbling without subsequent striking against object, initial encounter: Secondary | ICD-10-CM | POA: Insufficient documentation

## 2012-03-20 DIAGNOSIS — Y929 Unspecified place or not applicable: Secondary | ICD-10-CM | POA: Insufficient documentation

## 2012-03-20 DIAGNOSIS — I10 Essential (primary) hypertension: Secondary | ICD-10-CM | POA: Insufficient documentation

## 2012-03-20 DIAGNOSIS — R209 Unspecified disturbances of skin sensation: Secondary | ICD-10-CM | POA: Insufficient documentation

## 2012-03-20 DIAGNOSIS — S060X9A Concussion with loss of consciousness of unspecified duration, initial encounter: Secondary | ICD-10-CM

## 2012-03-20 DIAGNOSIS — Z79899 Other long term (current) drug therapy: Secondary | ICD-10-CM | POA: Insufficient documentation

## 2012-03-20 DIAGNOSIS — R32 Unspecified urinary incontinence: Secondary | ICD-10-CM | POA: Insufficient documentation

## 2012-03-20 DIAGNOSIS — Y939 Activity, unspecified: Secondary | ICD-10-CM | POA: Insufficient documentation

## 2012-03-20 DIAGNOSIS — S060XAA Concussion with loss of consciousness status unknown, initial encounter: Secondary | ICD-10-CM | POA: Insufficient documentation

## 2012-03-20 LAB — CBC WITH DIFFERENTIAL/PLATELET
Basophils Absolute: 0 10*3/uL (ref 0.0–0.1)
Basophils Relative: 1 % (ref 0–1)
Eosinophils Absolute: 0.1 10*3/uL (ref 0.0–0.7)
Eosinophils Relative: 1 % (ref 0–5)
HCT: 37.8 % (ref 36.0–46.0)
Hemoglobin: 12.8 g/dL (ref 12.0–15.0)
Lymphocytes Relative: 40 % (ref 12–46)
Lymphs Abs: 2.6 10*3/uL (ref 0.7–4.0)
MCH: 29.1 pg (ref 26.0–34.0)
MCHC: 33.9 g/dL (ref 30.0–36.0)
MCV: 85.9 fL (ref 78.0–100.0)
Monocytes Absolute: 0.4 10*3/uL (ref 0.1–1.0)
Monocytes Relative: 6 % (ref 3–12)
Neutro Abs: 3.5 10*3/uL (ref 1.7–7.7)
Neutrophils Relative %: 53 % (ref 43–77)
Platelets: 376 10*3/uL (ref 150–400)
RBC: 4.4 MIL/uL (ref 3.87–5.11)
RDW: 13.1 % (ref 11.5–15.5)
WBC: 6.6 10*3/uL (ref 4.0–10.5)

## 2012-03-20 LAB — BASIC METABOLIC PANEL
BUN: 11 mg/dL (ref 6–23)
CO2: 25 mEq/L (ref 19–32)
Calcium: 8.8 mg/dL (ref 8.4–10.5)
Chloride: 102 mEq/L (ref 96–112)
Creatinine, Ser: 0.79 mg/dL (ref 0.50–1.10)
GFR calc Af Amer: >90 mL/min (ref >90)
GFR calc non Af Amer: >90 mL/min (ref >90)
Glucose, Bld: 105 mg/dL — ABNORMAL HIGH (ref 70–99)
Potassium: 3.1 mEq/L — ABNORMAL LOW (ref 3.5–5.1)
Sodium: 138 mEq/L (ref 135–145)

## 2012-03-20 MED ORDER — HYDROMORPHONE HCL PF 1 MG/ML IJ SOLN
1.0000 mg | Freq: Once | INTRAMUSCULAR | Status: AC
  Administered 2012-03-20: 1 mg via INTRAVENOUS
  Filled 2012-03-20: qty 1

## 2012-03-20 MED ORDER — DIAZEPAM 5 MG/ML IJ SOLN
5.0000 mg | Freq: Once | INTRAMUSCULAR | Status: AC
  Administered 2012-03-20: 5 mg via INTRAVENOUS
  Filled 2012-03-20: qty 2

## 2012-03-20 NOTE — ED Provider Notes (Signed)
History     CSN: [id number]  Arrival date & time 03/20/12  1439   First MD Initiated Contact with Patient 03/20/12 1556      Chief Complaint  Patient presents with  . Extremity Weakness    (Consider location/radiation/quality/duration/timing/severity/associated sxs/prior treatment) HPI Comments: 42 year old female who presents 48 hours after falling on the ice and striking her forehead on the ground. There were 2 successive falls over a very short time frame in a matter of seconds, initially she had a headache and was hurting in her pelvis. She was seen and had negative CT scanning of her head and pelvis at that time without intracranial hemorrhage or fractures. Since that time she has had 2 episodes of urinary incontinence which is very abnormal for her, she has had weakness of her right upper and right lower extremities which is mild, she is able to ambulate but notices decreased strength in those extremities. The symptoms are persistent, nothing seems to make it better or worse, not associated with difficulty breathing, no change in vision, no difficulty swallowing or speaking.  The history is provided by the patient and medical records.    Past Medical History  Diagnosis Date  . Hypertension     No past surgical history on file.  No family history on file.  History  Substance Use Topics  . Smoking status: Never Smoker   . Smokeless tobacco: Not on file  . Alcohol Use: Yes    OB History   Grav Para Term Preterm Abortions TAB SAB Ect Mult Living                  Review of Systems  All other systems reviewed and are negative.    Allergies  Codeine; Other; and Latex  Home Medications   Current Outpatient Rx  Name  Route  Sig  Dispense  Refill  . cyclobenzaprine (FLEXERIL) 10 MG tablet   Oral   Take 1 tablet (10 mg total) by mouth 2 (two) times daily as needed for muscle spasms.   20 tablet   0   . metoprolol succinate (TOPROL-XL) 100 MG 24 hr tablet    Oral   Take 100 mg by mouth every morning. Take with or immediately following a meal.         . naproxen (NAPROSYN) 500 MG tablet   Oral   Take 1 tablet (500 mg total) by mouth 2 (two) times daily.   30 tablet   0   . potassium chloride (K-DUR) 10 MEQ tablet   Oral   Take 30 mEq by mouth daily.          Marland Kitchen triamterene-hydrochlorothiazide (MAXZIDE) 75-50 MG per tablet   Oral   Take 1 tablet by mouth every morning.           BP 183/116  Pulse 88  Temp(Src) 97.7 F (36.5 C) (Oral)  Resp 24  SpO2 98%  LMP 03/11/2012  Physical Exam  Nursing note and vitals reviewed. Constitutional: She appears well-developed and well-nourished. No distress.  HENT:  Head: Normocephalic.  Mouth/Throat: Oropharynx is clear and moist. No oropharyngeal exudate.  Small abrasion to her central forehead, no masses, no swelling, no hematoma, no laceration  Eyes: Conjunctivae and EOM are normal. Pupils are equal, round, and reactive to light. Right eye exhibits no discharge. Left eye exhibits no discharge. No scleral icterus.  no facial tenderness, deformity, malocclusion or hemotympanum.  no battle's sign or racoon eyes.   Neck: Normal range  of motion. Neck supple. No JVD present. No thyromegaly present.  Cardiovascular: Normal rate, regular rhythm, normal heart sounds and intact distal pulses.  Exam reveals no gallop and no friction rub.   No murmur heard. Pulmonary/Chest: Effort normal and breath sounds normal. No respiratory distress. She has no wheezes. She has no rales.  Abdominal: Soft. Bowel sounds are normal. She exhibits no distension and no mass. There is no tenderness.  Musculoskeletal: Normal range of motion. She exhibits no edema and no tenderness.  No deformities or tenderness to any of the 4 extremities to palpation or range of motion of the joints.  Lymphadenopathy:    She has no cervical adenopathy.  Neurological: She is alert. Coordination normal.  4+ out of 5 strength in the  upper and lower right upper and right lower extremities. Left upper and lower extremities with normal strength. Decreased sensation to light touch and pinprick of the right upper and lower extremities diffusely. Neutral toes on Babinski, decreased reflexes at the bilateral knees, normal reflexes at the bilateral brachial radialis, speech is clear, extraocular movements and cranial nerves III through XII remain intact, no limb ataxia on finger-nose-finger testing  Skin: Skin is warm and dry. No rash noted. No erythema.  Psychiatric: She has a normal mood and affect. Her behavior is normal.    ED Course  Procedures (including critical care time)  Labs Reviewed  BASIC METABOLIC PANEL - Abnormal; Notable for the following:    Potassium 3.1 (*)    Glucose, Bld 105 (*)    All other components within normal limits  CBC WITH DIFFERENTIAL   Mr Cervical Spine Wo Contrast  03/20/2012  *RADIOLOGY REPORT*  Clinical Data: 42 year old female status post fall.  Right upper extremity weakness numbness and neck pain.  MRI CERVICAL SPINE WITHOUT CONTRAST  Technique:  Multiplanar and multiecho pulse sequences of the cervical spine, to include the craniocervical junction and cervicothoracic junction, were obtained according to standard protocol without intravenous contrast.  Comparison: Neck CTA 08/02/2008.  Findings: Straightening of cervical lordosis similar to the comparison. No marrow edema or evidence of acute osseous abnormality.  No abnormal cervical ligamentous signal.  Cervicomedullary junction is within normal limits.  Spinal cord signal is within normal limits at all visualized levels.  No evidence of cervical nerve root avulsion.  There is evidence of a left posterior thyroid nodule (series 6 image 22).  This was probably present in 2010 and appears similar. Other visible paraspinal soft tissues are within normal limits.  C2-C3:  Negative.  C3-C4:  Mild right uncovertebral hypertrophy.  No stenosis.  C4-C5:   Left greater than right uncovertebral hypertrophy.  Mild left C5 foraminal stenosis.  C5-C6:  Mild uncovertebral hypertrophy.  No stenosis.  C6-C7:  Mild circumferential disc bulge.  No stenosis.  C7-T1:  Mild disc bulge.  Mild facet hypertrophy.  No stenosis.  No upper thoracic spinal stenosis.  IMPRESSION: 1.  No acute injury identified.  No cervical spinal stenosis or neural impingement identified. 2.  Partially visible left thyroid nodule is grossly stable since 2010.  Consider nonemergent follow-up thyroid ultrasound.   Original Report Authenticated By: Erskine Speed, M.D.      1. Head injury   2. Concussion       MDM  The patient has focal neurologic deficits, right greater than left, has vital signs significant for hypertension which is likely related to her anxiety and pain. She has had some muscle cramping on the exam and has a history  of hypokalemia, check labs, MRI of the cervical spine, immobilized with a cervical collar.   MRI is negative for any cord injury or spinal fractures. The patient has been reassured, she was ambulatory into the emergency department, tolerating oral fluids and can be discharged safely with concussion discharge instructions.  She has severe anaphylaxis to codeine, she will not be given opiate medications to go home though she states she can take hydromorphone safely she can go home with anti-inflammatories and followup, resource list given.    Vida Roller, MD 03/20/12 2005

## 2012-03-20 NOTE — ED Notes (Signed)
Pt stated that was not feeling better fell on ice 03/17/12 hit head no she is c/o extremity shaking and h/a c/o weakness on rt side neg for code stroke

## 2012-03-20 NOTE — ED Notes (Signed)
Patient transported to MRI 

## 2012-03-20 NOTE — ED Notes (Signed)
Pt able to tolerate food.

## 2012-03-20 NOTE — ED Notes (Signed)
MD wants to know if pt can walk and eat before sending pt home.  Pt is currently eating peanut butter and crackers.

## 2012-08-04 ENCOUNTER — Encounter (HOSPITAL_COMMUNITY): Payer: Self-pay | Admitting: *Deleted

## 2012-08-04 ENCOUNTER — Emergency Department (HOSPITAL_COMMUNITY)
Admission: EM | Admit: 2012-08-04 | Discharge: 2012-08-04 | Disposition: A | Discharge status: Home / Self Care | Payer: Self-pay | Attending: Emergency Medicine | Admitting: Emergency Medicine

## 2012-08-04 DIAGNOSIS — R1310 Dysphagia, unspecified: Secondary | ICD-10-CM | POA: Insufficient documentation

## 2012-08-04 DIAGNOSIS — T7840XA Allergy, unspecified, initial encounter: Secondary | ICD-10-CM

## 2012-08-04 DIAGNOSIS — S30860A Insect bite (nonvenomous) of lower back and pelvis, initial encounter: Secondary | ICD-10-CM | POA: Insufficient documentation

## 2012-08-04 DIAGNOSIS — R0789 Other chest pain: Secondary | ICD-10-CM | POA: Insufficient documentation

## 2012-08-04 DIAGNOSIS — I10 Essential (primary) hypertension: Secondary | ICD-10-CM | POA: Insufficient documentation

## 2012-08-04 DIAGNOSIS — R000 Tachycardia, unspecified: Secondary | ICD-10-CM | POA: Insufficient documentation

## 2012-08-04 DIAGNOSIS — Z91040 Latex allergy status: Secondary | ICD-10-CM | POA: Insufficient documentation

## 2012-08-04 DIAGNOSIS — K122 Cellulitis and abscess of mouth: Secondary | ICD-10-CM | POA: Insufficient documentation

## 2012-08-04 DIAGNOSIS — Y929 Unspecified place or not applicable: Secondary | ICD-10-CM | POA: Insufficient documentation

## 2012-08-04 DIAGNOSIS — R21 Rash and other nonspecific skin eruption: Secondary | ICD-10-CM | POA: Insufficient documentation

## 2012-08-04 DIAGNOSIS — W57XXXA Bitten or stung by nonvenomous insect and other nonvenomous arthropods, initial encounter: Secondary | ICD-10-CM | POA: Insufficient documentation

## 2012-08-04 DIAGNOSIS — Y9389 Activity, other specified: Secondary | ICD-10-CM | POA: Insufficient documentation

## 2012-08-04 DIAGNOSIS — J029 Acute pharyngitis, unspecified: Secondary | ICD-10-CM | POA: Insufficient documentation

## 2012-08-04 MED ORDER — METOPROLOL TARTRATE 25 MG PO TABS
50.0000 mg | ORAL_TABLET | Freq: Once | ORAL | Status: AC
  Administered 2012-08-04: 50 mg via ORAL
  Filled 2012-08-04: qty 2

## 2012-08-04 MED ORDER — HYDROXYZINE HCL 50 MG/ML IM SOLN: 50.0000 mg | Freq: Four times a day (QID) | INTRAMUSCULAR | Status: DC | PRN

## 2012-08-04 MED ORDER — DIPHENHYDRAMINE HCL 50 MG/ML IJ SOLN
25.0000 mg | Freq: Once | INTRAMUSCULAR | Status: AC
  Administered 2012-08-04: 25 mg via INTRAVENOUS
  Filled 2012-08-04: qty 1

## 2012-08-04 MED ORDER — DEXAMETHASONE SODIUM PHOSPHATE 10 MG/ML IJ SOLN
10.0000 mg | Freq: Once | INTRAMUSCULAR | Status: AC
  Administered 2012-08-04: 10 mg via INTRAMUSCULAR
  Filled 2012-08-04: qty 1

## 2012-08-04 MED ORDER — PREDNISONE 20 MG PO TABS: ORAL_TABLET | ORAL | Status: DC

## 2012-08-04 MED ORDER — DIPHENHYDRAMINE HCL 50 MG/ML IJ SOLN
25.0000 mg | Freq: Once | INTRAMUSCULAR | Status: AC
  Administered 2012-08-04: 25 mg via INTRAMUSCULAR
  Filled 2012-08-04: qty 1

## 2012-08-04 MED ORDER — FAMOTIDINE IN NACL 20-0.9 MG/50ML-% IV SOLN
20.0000 mg | Freq: Once | INTRAVENOUS | Status: DC
  Filled 2012-08-04: qty 50

## 2012-08-04 MED ORDER — DIPHENHYDRAMINE HCL 25 MG PO TABS: 25.0000 mg | ORAL_TABLET | Freq: Four times a day (QID) | ORAL | Status: DC

## 2012-08-04 MED ORDER — FAMOTIDINE 20 MG PO TABS
20.0000 mg | ORAL_TABLET | Freq: Once | ORAL | Status: AC
  Administered 2012-08-04: 20 mg via ORAL
  Filled 2012-08-04: qty 1

## 2012-08-04 NOTE — ED Provider Notes (Signed)
History    CSN: [id number] Arrival date & time 08/04/12  1722  First MD Initiated Contact with Patient 08/04/12 1731     Chief Complaint  Patient presents with  . Insect Bite   (Consider location/radiation/quality/duration/timing/severity/associated sxs/prior Treatment) HPI Comments: 42 y/o female with a PMHx of HTN presents to the ED complaining of sore throat and difficulty swallowing after being bit by a bug yesterday. Patient states she was bit on the right side of her upper back, did not see the bug, and directly after became sore in the area and developed a pink rash on her arms and legs that was itchy, rash subsided overnight after taking benadryl. Shortly after the bite she also developed a sore throat with difficulty swallowing which did not subside with benadryl. Admits to chest tightness. `Denies new foods or any other exposures.  The history is provided by the patient.   Past Medical History  Diagnosis Date  . Hypertension    History reviewed. No pertinent past surgical history. No family history on file. History  Substance Use Topics  . Smoking status: Never Smoker   . Smokeless tobacco: Not on file  . Alcohol Use: Yes   OB History   Grav Para Term Preterm Abortions TAB SAB Ect Mult Living                 Review of Systems  HENT: Positive for sore throat and trouble swallowing.   Respiratory: Positive for chest tightness. Negative for shortness of breath, wheezing and stridor.   Skin: Positive for rash.  All other systems reviewed and are negative.    Allergies  Codeine; Other; and Latex  Home Medications   Current Outpatient Rx  Name  Route  Sig  Dispense  Refill  . metoprolol succinate (TOPROL-XL) 100 MG 24 hr tablet   Oral   Take 100 mg by mouth every morning. Take with or immediately following a meal.         . triamterene-hydrochlorothiazide (MAXZIDE) 75-50 MG per tablet   Oral   Take 1 tablet by mouth every morning.          BP 161/112   Pulse 112  Temp(Src) 98.3 F (36.8 C) (Oral)  SpO2 97% Physical Exam  Nursing note and vitals reviewed. Constitutional: She is oriented to person, place, and time. She appears well-developed and well-nourished. No distress.  HENT:  Head: Normocephalic and atraumatic.  Mouth/Throat: Uvula is midline and mucous membranes are normal. Edematous present. Posterior oropharyngeal edema present. No oropharyngeal exudate or posterior oropharyngeal erythema.  Airway patent.  Eyes: Conjunctivae are normal.  Neck: Normal range of motion. Neck supple. No tracheal deviation present.  Cardiovascular: Regular rhythm and normal heart sounds.  Tachycardia present.   Pulmonary/Chest: Effort normal and breath sounds normal. No stridor. No respiratory distress. She has no decreased breath sounds. She has no wheezes.  Musculoskeletal: Normal range of motion. She exhibits no edema.  Neurological: She is alert and oriented to person, place, and time.  Skin: Skin is warm and dry. No rash noted. She is not diaphoretic.  Psychiatric: She has a normal mood and affect. Her behavior is normal.    ED Course  Procedures (including critical care time) Labs Reviewed - No data to display No results found. 1. Allergic reaction, initial encounter   2. Uvulitis     MDM  Uvula swelling s/p bug bite yesterday. Airway patent, NAD. No rash present. IM decadron and benadryl, will re-assess. 7:10 PM Chest  heaviness improving. Still with throat pain, difficulty swallowing. Will give 50mg  IM vistaril, 20mg  pepcid. Patient discussed with Dr. Freida Busman who agrees with plan of care. 7:35 PM Vistaril on national backorder. Nurse had started IV, will give 25 mg benadryl IV. 8:10 PM Patient reports marked improvement of symptoms. Throat less sore, easier to swallow. She is able to swallow pepcid pill without difficulty. Uvula swelling decreasing. She is in NAD. Stable for discharge. Rx for prednisone, benadryl. Close return  precautions discussed. Patient states understanding of plan and is agreeable.   Trevor Mace, PA-C 08/04/12 2011

## 2012-08-04 NOTE — ED Notes (Signed)
Discharge instructions reviewed. Pt verbalized understanding.  

## 2012-08-04 NOTE — ED Notes (Signed)
Pt states she was bit by an insect on her back yesterday and began to have some cp, swelling, difficulty swallowing, and hives. Pt states today she still has some difficulty swallowing, and breathing. She states "feels like an elephant is sitting on my chest".

## 2012-08-04 NOTE — ED Notes (Addendum)
Pt c/o swelling and tightness in her throat since getting an insect bite yesterday. States had hives and SOB yesterday. Took some Benadryl without improvement. Today has worsening of throat tightness and c/o chest pain. Lungs clear. Pt transferred to POD A-8.

## 2012-08-04 NOTE — ED Notes (Signed)
The pt was stung or bitten by some type bug or insect yesterday on her anterior throat.  She had a rash yesterday and she has throat pain with difficulty swallowing.  Today the rash is gone but her throat hurts and she has difficulty swallwing

## 2012-08-06 NOTE — ED Provider Notes (Signed)
Medical screening examination/treatment/procedure(s) were performed by non-physician practitioner and as supervising physician I was immediately available for consultation/collaboration.  Jeffree Cazeau T Albertina Leise, MD 08/06/12 2038 

## 2012-10-05 ENCOUNTER — Encounter (HOSPITAL_COMMUNITY): Payer: Self-pay | Admitting: Anesthesiology

## 2012-10-05 ENCOUNTER — Emergency Department (HOSPITAL_COMMUNITY): Payer: Self-pay | Admitting: Anesthesiology

## 2012-10-05 ENCOUNTER — Emergency Department (HOSPITAL_COMMUNITY): Payer: Self-pay

## 2012-10-05 ENCOUNTER — Inpatient Hospital Stay (HOSPITAL_COMMUNITY)
Admission: EM | Admit: 2012-10-05 | Discharge: 2012-10-07 | DRG: 915 | Disposition: A | Discharge status: Home / Self Care | Payer: Self-pay | Attending: Pulmonary Disease | Admitting: Pulmonary Disease

## 2012-10-05 ENCOUNTER — Encounter (HOSPITAL_COMMUNITY): Payer: Self-pay | Admitting: Emergency Medicine

## 2012-10-05 DIAGNOSIS — R220 Localized swelling, mass and lump, head: Secondary | ICD-10-CM | POA: Diagnosis present

## 2012-10-05 DIAGNOSIS — J9600 Acute respiratory failure, unspecified whether with hypoxia or hypercapnia: Secondary | ICD-10-CM | POA: Diagnosis present

## 2012-10-05 DIAGNOSIS — T6391XA Toxic effect of contact with unspecified venomous animal, accidental (unintentional), initial encounter: Secondary | ICD-10-CM | POA: Diagnosis present

## 2012-10-05 DIAGNOSIS — T380X5A Adverse effect of glucocorticoids and synthetic analogues, initial encounter: Secondary | ICD-10-CM | POA: Diagnosis present

## 2012-10-05 DIAGNOSIS — T783XXD Angioneurotic edema, subsequent encounter: Secondary | ICD-10-CM

## 2012-10-05 DIAGNOSIS — T783XXA Angioneurotic edema, initial encounter: Secondary | ICD-10-CM | POA: Diagnosis present

## 2012-10-05 DIAGNOSIS — T782XXA Anaphylactic shock, unspecified, initial encounter: Principal | ICD-10-CM | POA: Diagnosis present

## 2012-10-05 DIAGNOSIS — I517 Cardiomegaly: Secondary | ICD-10-CM

## 2012-10-05 DIAGNOSIS — I10 Essential (primary) hypertension: Secondary | ICD-10-CM | POA: Diagnosis present

## 2012-10-05 DIAGNOSIS — T782XXD Anaphylactic shock, unspecified, subsequent encounter: Secondary | ICD-10-CM

## 2012-10-05 DIAGNOSIS — IMO0001 Reserved for inherently not codable concepts without codable children: Secondary | ICD-10-CM | POA: Diagnosis present

## 2012-10-05 DIAGNOSIS — Y92009 Unspecified place in unspecified non-institutional (private) residence as the place of occurrence of the external cause: Secondary | ICD-10-CM

## 2012-10-05 DIAGNOSIS — R7309 Other abnormal glucose: Secondary | ICD-10-CM | POA: Diagnosis present

## 2012-10-05 LAB — BLOOD GAS, ARTERIAL
Bicarbonate: 22.1 mEq/L (ref 20.0–24.0)
Drawn by: 331471
PEEP: 5 cmH2O
Patient temperature: 98.6
pH, Arterial: 7.344 — ABNORMAL LOW (ref 7.350–7.450)
pO2, Arterial: 127 mmHg — ABNORMAL HIGH (ref 80.0–100.0)

## 2012-10-05 LAB — PROTIME-INR: Prothrombin Time: 12.8 seconds (ref 11.6–15.2)

## 2012-10-05 LAB — BASIC METABOLIC PANEL
BUN: 11 mg/dL (ref 6–23)
Calcium: 8.5 mg/dL (ref 8.4–10.5)
Creatinine, Ser: 0.79 mg/dL (ref 0.50–1.10)
GFR calc Af Amer: >90 mL/min (ref >90)

## 2012-10-05 LAB — CBC WITH DIFFERENTIAL/PLATELET
Basophils Absolute: 0 10*3/uL (ref 0.0–0.1)
Basophils Relative: 0 % (ref 0–1)
Eosinophils Absolute: 0.1 10*3/uL (ref 0.0–0.7)
Eosinophils Relative: 1 % (ref 0–5)
HCT: 36.2 % (ref 36.0–46.0)
MCH: 29.2 pg (ref 26.0–34.0)
MCHC: 33.7 g/dL (ref 30.0–36.0)
MCV: 86.6 fL (ref 78.0–100.0)
Monocytes Absolute: 0.6 10*3/uL (ref 0.1–1.0)
Monocytes Relative: 7 % (ref 3–12)
RDW: 13.3 % (ref 11.5–15.5)

## 2012-10-05 LAB — HEPATIC FUNCTION PANEL
ALT: 20 U/L (ref 0–35)
Total Protein: 7 g/dL (ref 6.0–8.3)

## 2012-10-05 LAB — PRO B NATRIURETIC PEPTIDE: Pro B Natriuretic peptide (BNP): 15.2 pg/mL (ref 0–125)

## 2012-10-05 LAB — GLUCOSE, CAPILLARY: Glucose-Capillary: 116 mg/dL — ABNORMAL HIGH (ref 70–99)

## 2012-10-05 LAB — RAPID URINE DRUG SCREEN, HOSP PERFORMED: Benzodiazepines: POSITIVE — AB

## 2012-10-05 LAB — PROCALCITONIN: Procalcitonin: <0.1 ng/mL

## 2012-10-05 LAB — TROPONIN I
Troponin I: <0.3 ng/mL (ref <0.30)
Troponin I: <0.3 ng/mL (ref <0.30)
Troponin I: <0.3 ng/mL (ref <0.30)

## 2012-10-05 MED ORDER — CEFTRIAXONE SODIUM 2 G IJ SOLR
2.0000 g | INTRAMUSCULAR | Status: DC
  Administered 2012-10-05: 2 g via INTRAVENOUS
  Filled 2012-10-05 (×2): qty 2

## 2012-10-05 MED ORDER — LIDOCAINE HCL (CARDIAC) 20 MG/ML IV SOLN
INTRAVENOUS | Status: AC
  Filled 2012-10-05: qty 5

## 2012-10-05 MED ORDER — MIDAZOLAM HCL 2 MG/2ML IJ SOLN: 5.0000 mg | Freq: Once | INTRAMUSCULAR | Status: AC

## 2012-10-05 MED ORDER — SODIUM CHLORIDE 0.9 % IV SOLN: INTRAVENOUS | Status: DC

## 2012-10-05 MED ORDER — INSULIN ASPART 100 UNIT/ML ~~LOC~~ SOLN
0.0000 [IU] | SUBCUTANEOUS | Status: DC
  Administered 2012-10-05: 4 [IU] via SUBCUTANEOUS

## 2012-10-05 MED ORDER — FENTANYL BOLUS VIA INFUSION
25.0000 ug | Freq: Four times a day (QID) | INTRAVENOUS | Status: DC | PRN
  Filled 2012-10-05: qty 100

## 2012-10-05 MED ORDER — FAMOTIDINE IN NACL 20-0.9 MG/50ML-% IV SOLN
20.0000 mg | Freq: Once | INTRAVENOUS | Status: AC
  Administered 2012-10-05: 20 mg via INTRAVENOUS
  Filled 2012-10-05: qty 50

## 2012-10-05 MED ORDER — METHYLPREDNISOLONE SODIUM SUCC 125 MG IJ SOLR
60.0000 mg | Freq: Four times a day (QID) | INTRAMUSCULAR | Status: DC
  Administered 2012-10-05 – 2012-10-07 (×8): 60 mg via INTRAVENOUS
  Filled 2012-10-05 (×3): qty 0.96
  Filled 2012-10-05 (×2): qty 2
  Filled 2012-10-05 (×4): qty 0.96
  Filled 2012-10-05: qty 2
  Filled 2012-10-05: qty 0.96
  Filled 2012-10-05: qty 2
  Filled 2012-10-05 (×3): qty 0.96

## 2012-10-05 MED ORDER — ALBUTEROL SULFATE (5 MG/ML) 0.5% IN NEBU: 2.5000 mg | INHALATION_SOLUTION | RESPIRATORY_TRACT | Status: DC | PRN

## 2012-10-05 MED ORDER — SODIUM CHLORIDE 0.9 % IV SOLN
Freq: Once | INTRAVENOUS | Status: AC
  Administered 2012-10-05: 08:00:00 via INTRAVENOUS

## 2012-10-05 MED ORDER — PROPOFOL 10 MG/ML IV BOLUS
INTRAVENOUS | Status: AC
  Filled 2012-10-05: qty 2

## 2012-10-05 MED ORDER — CHLORHEXIDINE GLUCONATE 0.12 % MT SOLN
15.0000 mL | Freq: Two times a day (BID) | OROMUCOSAL | Status: DC
  Administered 2012-10-05 – 2012-10-06 (×3): 15 mL via OROMUCOSAL
  Filled 2012-10-05 (×7): qty 15

## 2012-10-05 MED ORDER — BIOTENE DRY MOUTH MT LIQD
15.0000 mL | Freq: Four times a day (QID) | OROMUCOSAL | Status: DC
  Administered 2012-10-05 – 2012-10-07 (×7): 15 mL via OROMUCOSAL

## 2012-10-05 MED ORDER — SODIUM CHLORIDE 0.9 % IV SOLN
25.0000 ug/h | INTRAVENOUS | Status: DC
  Administered 2012-10-05: 200 ug/h via INTRAVENOUS
  Administered 2012-10-05: 300 ug/h via INTRAVENOUS
  Administered 2012-10-05: 250 ug/h via INTRAVENOUS
  Administered 2012-10-05: 100 ug/h via INTRAVENOUS
  Administered 2012-10-05: 200 ug/h via INTRAVENOUS
  Administered 2012-10-05: 50 ug/h via INTRAVENOUS
  Filled 2012-10-05 (×2): qty 50

## 2012-10-05 MED ORDER — ONDANSETRON HCL 4 MG/2ML IJ SOLN
4.0000 mg | Freq: Once | INTRAMUSCULAR | Status: AC
  Administered 2012-10-05: 4 mg via INTRAVENOUS

## 2012-10-05 MED ORDER — FAMOTIDINE IN NACL 20-0.9 MG/50ML-% IV SOLN
20.0000 mg | Freq: Two times a day (BID) | INTRAVENOUS | Status: DC
  Administered 2012-10-05 – 2012-10-06 (×4): 20 mg via INTRAVENOUS
  Filled 2012-10-05 (×5): qty 50

## 2012-10-05 MED ORDER — LABETALOL HCL 5 MG/ML IV SOLN: 10.0000 mg | INTRAVENOUS | Status: DC | PRN

## 2012-10-05 MED ORDER — HEPARIN SODIUM (PORCINE) 5000 UNIT/ML IJ SOLN
5000.0000 [IU] | Freq: Three times a day (TID) | INTRAMUSCULAR | Status: DC
  Filled 2012-10-05 (×3): qty 1

## 2012-10-05 MED ORDER — SODIUM CHLORIDE 0.9 % IV BOLUS (SEPSIS)
500.0000 mL | Freq: Once | INTRAVENOUS | Status: AC
  Administered 2012-10-05: 500 mL via INTRAVENOUS

## 2012-10-05 MED ORDER — ONDANSETRON HCL 4 MG/2ML IJ SOLN
INTRAMUSCULAR | Status: AC
  Filled 2012-10-05: qty 2

## 2012-10-05 MED ORDER — DEXTROSE 5 % IV SOLN
500.0000 mg | INTRAVENOUS | Status: DC
  Administered 2012-10-05: 500 mg via INTRAVENOUS
  Filled 2012-10-05 (×2): qty 500

## 2012-10-05 MED ORDER — SUCCINYLCHOLINE CHLORIDE 20 MG/ML IJ SOLN
INTRAMUSCULAR | Status: AC
  Administered 2012-10-05: 100 mg
  Filled 2012-10-05: qty 5

## 2012-10-05 MED ORDER — PROPOFOL 10 MG/ML IV EMUL
INTRAVENOUS | Status: AC
  Filled 2012-10-05: qty 100

## 2012-10-05 MED ORDER — DEXAMETHASONE SODIUM PHOSPHATE 10 MG/ML IJ SOLN
10.0000 mg | Freq: Once | INTRAMUSCULAR | Status: AC
  Administered 2012-10-05: 10 mg via INTRAVENOUS
  Filled 2012-10-05: qty 1

## 2012-10-05 MED ORDER — PROPOFOL 10 MG/ML IV EMUL
5.0000 ug/kg/min | INTRAVENOUS | Status: DC
  Administered 2012-10-05 (×3): 50 ug/kg/min via INTRAVENOUS
  Administered 2012-10-05: 70 ug/kg/min via INTRAVENOUS
  Administered 2012-10-05: 5 ug/kg/min via INTRAVENOUS
  Administered 2012-10-06: 20 ug/kg/min via INTRAVENOUS
  Filled 2012-10-05 (×3): qty 100

## 2012-10-05 MED ORDER — HEPARIN SODIUM (PORCINE) 5000 UNIT/ML IJ SOLN
5000.0000 [IU] | Freq: Three times a day (TID) | INTRAMUSCULAR | Status: DC
  Administered 2012-10-05 – 2012-10-07 (×6): 5000 [IU] via SUBCUTANEOUS
  Filled 2012-10-05 (×9): qty 1

## 2012-10-05 MED ORDER — MIDAZOLAM HCL 2 MG/2ML IJ SOLN
INTRAMUSCULAR | Status: AC
  Administered 2012-10-05: 5 mg via INTRAVENOUS
  Filled 2012-10-05: qty 6

## 2012-10-05 MED ORDER — SODIUM CHLORIDE 0.9 % IV SOLN
INTRAVENOUS | Status: DC
  Administered 2012-10-05: 10 mL via INTRAVENOUS

## 2012-10-05 MED ORDER — ETOMIDATE 2 MG/ML IV SOLN
INTRAVENOUS | Status: AC
  Administered 2012-10-05: 20 mg
  Filled 2012-10-05: qty 20

## 2012-10-05 MED ORDER — DIPHENHYDRAMINE HCL 50 MG/ML IJ SOLN
50.0000 mg | Freq: Four times a day (QID) | INTRAMUSCULAR | Status: DC
  Administered 2012-10-05 – 2012-10-07 (×8): 50 mg via INTRAVENOUS
  Filled 2012-10-05 (×17): qty 1

## 2012-10-05 MED ORDER — DIPHENHYDRAMINE HCL 50 MG/ML IJ SOLN
25.0000 mg | Freq: Once | INTRAMUSCULAR | Status: AC
  Administered 2012-10-05: 25 mg via INTRAVENOUS
  Filled 2012-10-05: qty 1

## 2012-10-05 MED ORDER — FUROSEMIDE 10 MG/ML IJ SOLN
40.0000 mg | Freq: Once | INTRAMUSCULAR | Status: AC
  Administered 2012-10-05: 40 mg via INTRAVENOUS
  Filled 2012-10-05: qty 4

## 2012-10-05 MED ORDER — ROCURONIUM BROMIDE 50 MG/5ML IV SOLN
INTRAVENOUS | Status: AC
  Filled 2012-10-05: qty 2

## 2012-10-05 NOTE — ED Notes (Addendum)
Pt states that she was bit by a mosquito last night. Has hx of anaphylactic reaction to bee sting. Pt gave self po zyrtec last night. Woke up with sob and a feeling that her throat was swollen. Gave self epi pen last night. Pt speaking in full sentences. Pt states itching and rash have decreased. Pt still feels throat is swollen. Coarse wheezing noted in lower L lung. Pt bit on L leg

## 2012-10-05 NOTE — ED Provider Notes (Addendum)
CSN: [id number]     Arrival date & time 10/05/12  0711 History   First MD Initiated Contact with Patient 10/05/12 [phone]     Chief Complaint  Patient presents with  . Shortness of Breath  . Allergic Reaction   (Consider location/radiation/quality/duration/timing/severity/associated sxs/prior Treatment) HPI Patient presents with dyspnea and sensation of throat fullness. Symptoms began last night, approximately 10 hours prior to ED evaluation.  Since onset patient has had minimal relief with EpiPen. She denies syncope, near syncope.  She states that symptoms are progressive. There is no associated chest pain. Patient recalls being stung by at least one bee, multiple other insects within the past 24 hours. Patient has a history of allergic reactions in the past, as well as hypertension, but is otherwise healthy.   Past Medical History  Diagnosis Date  . Hypertension    History reviewed. No pertinent past surgical history. History reviewed. No pertinent family history. History  Substance Use Topics  . Smoking status: Never Smoker   . Smokeless tobacco: Not on file  . Alcohol Use: Yes   OB History   Grav Para Term Preterm Abortions TAB SAB Ect Mult Living                 Review of Systems  Constitutional:       Per HPI, otherwise negative  HENT:       Per HPI, otherwise negative  Respiratory:       Per HPI, otherwise negative  Cardiovascular:       Per HPI, otherwise negative  Gastrointestinal: Negative for nausea and vomiting.  Endocrine:       Negative aside from HPI  Genitourinary:       Neg aside from HPI   Musculoskeletal:       Per HPI, otherwise negative  Skin: Negative.   Allergic/Immunologic: Positive for environmental allergies. Negative for immunocompromised state.  Neurological: Negative for syncope and weakness.    Allergies  Bee venom; Codeine; Other; and Latex  Home Medications   Current Outpatient Rx  Name  Route  Sig  Dispense  Refill  .  EPINEPHrine (EPI-PEN) 0.3 mg/0.3 mL SOAJ injection   Intramuscular   Inject 0.3 mg into the muscle once.         . metoprolol succinate (TOPROL-XL) 100 MG 24 hr tablet   Oral   Take 100 mg by mouth every morning. Take with or immediately following a meal.         . triamterene-hydrochlorothiazide (MAXZIDE) 75-50 MG per tablet   Oral   Take 1 tablet by mouth every morning.          BP 163/107  Pulse 109  Temp(Src) 98.2 F (36.8 C) (Oral)  Resp 20  SpO2 100% Physical Exam  Nursing note and vitals reviewed. Constitutional: She is oriented to person, place, and time. She appears well-developed and well-nourished. No distress.  HENT:  Head: Normocephalic and atraumatic.  Mouth/Throat: Uvula is midline and mucous membranes are normal. Posterior oropharyngeal edema present. No posterior oropharyngeal erythema.    Eyes: Conjunctivae and EOM are normal.  Neck:  Mild tenderness to palpation throughout the anterior neck, without deformity  Cardiovascular: Regular rhythm.  Tachycardia present.   Pulmonary/Chest: Effort normal and breath sounds normal. No stridor. No respiratory distress.  Abdominal: She exhibits no distension.  Musculoskeletal: She exhibits no edema.  Neurological: She is alert and oriented to person, place, and time. No cranial nerve deficit.  Skin: Skin is warm and dry.  Psychiatric: She has a normal mood and affect.    ED Course  Procedural sedation Date/Time: 10/05/2012 9:10 AM Performed by: Gerhard Munch Authorized by: Gerhard Munch Consent: written consent obtained. Risks and benefits: risks, benefits and alternatives were discussed Consent given by: patient Patient understanding: patient states understanding of the procedure being performed Patient consent: the patient's understanding of the procedure matches consent given Procedure consent: procedure consent matches procedure scheduled Relevant documents: relevant documents present and  verified Test results: test results available and properly labeled Site marked: the operative site was marked Imaging studies: imaging studies available Required items: required blood products, implants, devices, and special equipment available Patient identity confirmed: verbally with patient and hospital-assigned identification number Time out: Immediately prior to procedure a "time out" was called to verify the correct patient, procedure, equipment, support staff and site/side marked as required. Preparation: Patient was prepped and draped in the usual sterile fashion. Local anesthesia used: no Patient sedated: yes Sedation type: moderate (conscious) sedation Sedatives: etomidate and see MAR for details (with succinylcholine) Sedation start date/time: 10/05/2012 9:10 AM Sedation end date/time: 10/05/2012 9:27 AM Vitals: Vital signs were monitored during sedation. Patient tolerance: Patient tolerated the procedure well with no immediate complications. Comments: Patient sedated to facilitate intubation. Intubation performed by the anesthesia service. Sedation well tolerated.  Subsequent, the patient required propofol for further sedation.   (including critical care time) Labs Review Labs Reviewed - No data to display Imaging Review No results found. Pulse oximetry 97% room air adequate Cardiac 105 sinus tachycardia abnormal  After the initial evaluation the patient received fluid resuscitation with Benadryl, Pepcid, Decadron.  8:27 AM  oxygen saturation is 100% on room air, but the patient states that she is feeling progressively worse.  Repeat exam demonstrates that the uvula is now protruding anteriorly, is edematous still.  Update: I discussed the patient's case with our anesthesia colleagues, ENT colleague.  Anesthesia will assist with intubation given the progressive oropharyngeal edema, concern for occlusion.    Update: Airway secured.  Patient hypertensive, but tolerating  mechanical ventilation.  Update: I discussed the case with our CCM team.  9:23 AM Now on propofol , with small spontaneous motion.  O2- 99% via mech vent HR 120's tregular - abnormal   9:46 AM Propofol , still moving,  BP good. Patient will receive additional versed. CCM at bedside to assist with disposition.   EKG has a sinus rhythm, rate 97, borderline T wave changes, otherwise unremarkable.   I discussed the patient's case with her boyfriend, who will arrive here later. MDM  No diagnosis found. Patient presents with oral pharyngeal edema presumably from recent bee sting.  Given the patient's progression of symptoms for one home EpiPen use, and her progression in spite of resuscitation here with fluids, Decadron, antihistamines, she required intubation for airway protection.  The patient tolerated the intubation well, but remained hypertensive.  She was admitted to the ICU for further E/M of her airway occlusion.   CRITICAL CARE Performed by: Gerhard Munch Total critical care time: 50 Critical care time was exclusive of separately billable procedures and treating other patients. Critical care was necessary to treat or prevent imminent or life-threatening deterioration. Critical care was time spent personally by me on the following activities: development of treatment plan with patient and/or surrogate as well as nursing, discussions with consultants, evaluation of patient's response to treatment, examination of patient, obtaining history from patient or surrogate, ordering and performing treatments and interventions, ordering and review of laboratory  studies, ordering and review of radiographic studies, pulse oximetry and re-evaluation of patient's condition.     Gerhard Munch, MD 10/05/12 1610  Gerhard Munch, MD 10/05/12 1058

## 2012-10-05 NOTE — Progress Notes (Signed)
Pt listed without insurance coverage and pcp No family present at bedside and pt intubated  ED CM unable to determined pcp or coverage at this time

## 2012-10-05 NOTE — ED Notes (Signed)
Pt o2 sat 100% on RA. Pt requests o2 by [place] per comfort

## 2012-10-05 NOTE — Progress Notes (Signed)
CARE MANAGEMENT NOTE 10/05/2012  Patient:  Jenna Hill, Jenna Hill   Account Number:  [id number]  Date Initiated:  10/05/2012  Documentation initiated by:  DAVIS,RHONDA  Subjective/Objective Assessment:   anaphylatic response to insect bite, failed op treatment with epi pen, requiring  intubation     Action/Plan:   home when stable  pcp robert lockwood,md   Anticipated DC Date:  10/08/2012   Anticipated DC Plan:  HOME/SELF CARE  In-house referral  NA      DC Planning Services  NA      [facility] Choice  NA   Choice offered to / List presented to:  NA   DME arranged  NA      DME agency  NA     HH arranged  NA      HH agency  NA   Status of service:  In process, will continue to follow Medicare Important Message given?  NA - LOS <3 / Initial given by admissions (If response is "NO", the following Medicare IM given date fields will be blank) Date Medicare IM given:   Date Additional Medicare IM given:    Discharge Disposition:    Per UR Regulation:  Reviewed for med. necessity/level of care/duration of stay  If discussed at Long Length of Stay Meetings, dates discussed:    Comments:  [id number] Stark Jock, BSN, [place] [phone] Chart Reviewed for discharge and hospital needs. Discharge needs at time of review:  None Review of patient progress due on [id number].

## 2012-10-05 NOTE — Progress Notes (Signed)
UR completed 

## 2012-10-05 NOTE — Progress Notes (Signed)
Echo Lab  2D Echocardiogram completed.  Nadie Fiumara L Ilyssa Grennan, RDCS 10/05/2012 12:54 PM

## 2012-10-05 NOTE — ED Notes (Signed)
Pt titrated up to 28mcg/kg/min, with MD approval. Pt still moving in bed. 5mg  versed ordered.

## 2012-10-05 NOTE — H&P (Signed)
PULMONARY  / CRITICAL CARE MEDICINE  Name: Jenna Hill MRN: [id number] DOB: May 31, 1970    ADMISSION DATE:  10/05/2012  REFERRING MD :  Gerhard Munch  CHIEF COMPLAINT:  Can't breath  BRIEF PATIENT DESCRIPTION:  42 yo female reports insect bite on 9/3.  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  Had progressive dyspnea and throat swelling >> no improvement after home Epi-pen.  Required intubation in ED, and PCCM asked to admit.  SIGNIFICANT EVENTS: 9/04 Admit ICU  STUDIES:  9/04 Echo >>   LINES / TUBES: ETT 9/04 >>  CULTURES: Sputum 9/04 >> Blood 9/04 >>  ANTIBIOTICS: Rocephin 9/04 >> Zithromax 9/04 >>   HISTORY OF PRESENT ILLNESS:   Hx obtained from medical record and d/w ED staff.  Pt reported bug bite last night.  She noticed itching and took zyrtec.  She woke feeling like her throat was swelling, and she could not breath.  She tried home Epi pen.  She has hx of allergies to bee stings.  She did not have improvement, and came to ED.  She was noted to have difficulty breathing.  She required intubation by anesthesia >> noted to have edema of posterior pharynx with narrow airway opening during intubation.  PAST MEDICAL HISTORY :  Past Medical History  Diagnosis Date  . Hypertension    History reviewed. No pertinent past surgical history.  Prior to Admission medications   Medication Sig Start Date End Date Taking? Authorizing Provider  EPINEPHrine (EPI-PEN) 0.3 mg/0.3 mL SOAJ injection Inject 0.3 mg into the muscle once.   Yes Historical Provider, MD  metoprolol succinate (TOPROL-XL) 100 MG 24 hr tablet Take 100 mg by mouth every morning. Take with or immediately following a meal.   Yes Historical Provider, MD  triamterene-hydrochlorothiazide (MAXZIDE) 75-50 MG per tablet Take 1 tablet by mouth every morning.   Yes Historical Provider, MD   Allergies  Allergen Reactions  . Bee Venom Anaphylaxis  . Codeine Anaphylaxis  . Other Anaphylaxis    bertrillium or bertillium     . Latex Rash    FAMILY HISTORY:  History reviewed. No pertinent family history.  SOCIAL HISTORY:  reports that she has never smoked. She does not have any smokeless tobacco history on file. She reports that  drinks alcohol. She reports that she does not use illicit drugs.  REVIEW OF SYSTEMS:  Unable to obtain  SUBJECTIVE:   VITAL SIGNS: Temp:  [98.2 F (36.8 C)] 98.2 F (36.8 C) (09/04 0715) Pulse Rate:  [96-118] 96 (09/04 0955) Resp:  [0-24] 0 (09/04 0955) BP: (160-187)/(97-166) 160/97 mmHg (09/04 0955) SpO2:  [91 %-100 %] 100 % (09/04 0955) FiO2 (%):  [100 %] 100 % (09/04 0921) HEMODYNAMICS:   VENTILATOR SETTINGS: Vent Mode:  [-] PRVC FiO2 (%):  [100 %] 100 % Vt Set:  [420 mL] 420 mL PEEP:  [5 cmH20] 5 cmH20 Plateau Pressure:  [18 cmH20] 18 cmH20  INTAKE / OUTPUT: Intake/Output   None     PHYSICAL EXAMINATION: General: No distress Neuro:  Sedated HEENT:  ETT in place, no lip/tongue swelling, no LAN, no thyromegaly Cardiovascular:  Regular, no murmur Lungs:  B/l rhonchi, no wheeze Abdomen:  Soft, approximately 2 inch copper pipe lodged in navel and 8 inch copper pipe under pt's pannus, non tender, decreased bowel sounds, surgical scars RUQ, no masses Musculoskeletal:  No edema Skin:  No rashes  LABS:  CBC Recent Labs     10/05/12  0902  WBC  8.4  HGB  12.2  HCT  36.2  PLT  350   Coag's Recent Labs     10/05/12  0902  INR  0.98   BMET Recent Labs     10/05/12  0902  NA  139  K  3.5  CL  104  CO2  26  BUN  11  CREATININE  0.79  GLUCOSE  126*   Electrolytes Recent Labs     10/05/12  0902  CALCIUM  8.5   Sepsis Markers No results found for this basename: LACTICACIDVEN, PROCALCITON, O2SATVEN,  in the last 72 hours  ABG No results found for this basename: PHART, PCO2ART, PO2ART,  in the last 72 hours  Liver Enzymes No results found for this basename: AST, ALT, ALKPHOS, BILITOT, ALBUMIN,  in the last 72 hours  Cardiac Enzymes No  results found for this basename: TROPONINI, PROBNP,  in the last 72 hours  Glucose No results found for this basename: GLUCAP,  in the last 72 hours  Imaging Dg Chest Port 1 View  10/05/2012   *RADIOLOGY REPORT*  Clinical Data: Shortness of breath  PORTABLE CHEST - 1 VIEW  Comparison: None.  Findings: An endotracheal tube is noted in satisfactory position. Cardiac shadow is stable.  Diffuse bilateral infiltrates are identified.  These are most marked on the right.  No pneumothorax or sizable effusion is seen.  IMPRESSION: Diffuse bilateral infiltrates.   Original Report Authenticated By: Alcide Clever, M.D.    ASSESSMENT / PLAN:  PULMONARY A: Acute respiratory failure 2nd to angioedema with anaphylaxis with angioedema posterior pharynx. Diffuse pulmonary infiltrates. P:   -full vent support -continue solumedrol, pepcid, benadryl -prn albuterol -f/u CXR, ABG -check UDS  CARDIOVASCULAR A: Hx of HTN. P:  -monitor on tele -check ECG, cardiac enzymes, BNP, Echo -labetalol prn for SBP > 170  RENAL A: No issues. P:   -monitor renal fx, urine outpt, electrolytes  GASTROINTESTINAL A: Nutrition. P:   -NPO -place OG tube -add tube feeds if unable to extubate soon  HEMATOLOGIC A:  No issues. P:  -f/u CBC -SQ heparin for DVT prevention  INFECTIOUS A: Diffuse pulmonary infiltrates. P:   -check blood, sputum, pneumococcal Ag -rocephin, zithromax -check procalcitonin  ENDOCRINE A: Steroid induced hyperglycemia.   P:   -SSI while on solumedrol  NEUROLOGIC A:  Sedation. P:   -continuous sedation while on vent  CC time 50 minutes.  Coralyn Helling, MD [facility] Pulmonary/Critical Care 10/05/2012, 10:10 AM Pager:  [phone] After 3pm call: [phone]

## 2012-10-05 NOTE — Progress Notes (Signed)
Informed K. Whiteheart NP of low BP readings; NS bolus 500 cc initiated.Daren Yeagle, Georga Hacking, RN

## 2012-10-05 NOTE — ED Notes (Addendum)
Found 8" copper pipe in crease of abdomen with 1" segment in umbilicus. Pt unresponsive, no longer moving extremities

## 2012-10-05 NOTE — ED Notes (Signed)
Propofol drip started. Pt moving arms, not oriented

## 2012-10-05 NOTE — ED Notes (Signed)
Pt intubated. Color change verified. Bilateral breath sounds

## 2012-10-05 NOTE — ED Notes (Signed)
Pt has medication belonging to another person in her purse.

## 2012-10-05 NOTE — ED Notes (Addendum)
Pt being intubated, 100% on o2, 122 HR, 185/166. Tube taken out then reinserted

## 2012-10-06 ENCOUNTER — Inpatient Hospital Stay (HOSPITAL_COMMUNITY): Payer: Self-pay

## 2012-10-06 DIAGNOSIS — Z5189 Encounter for other specified aftercare: Secondary | ICD-10-CM

## 2012-10-06 LAB — COMPREHENSIVE METABOLIC PANEL
AST: 18 U/L (ref 0–37)
Albumin: 3.1 g/dL — ABNORMAL LOW (ref 3.5–5.2)
Chloride: 104 mEq/L (ref 96–112)
Creatinine, Ser: 0.9 mg/dL (ref 0.50–1.10)
Potassium: 3.7 mEq/L (ref 3.5–5.1)
Total Bilirubin: 0.2 mg/dL — ABNORMAL LOW (ref 0.3–1.2)
Total Protein: 6.7 g/dL (ref 6.0–8.3)

## 2012-10-06 LAB — GLUCOSE, CAPILLARY
Glucose-Capillary: 118 mg/dL — ABNORMAL HIGH (ref 70–99)
Glucose-Capillary: 114 mg/dL — ABNORMAL HIGH (ref 70–99)
Glucose-Capillary: 148 mg/dL — ABNORMAL HIGH (ref 70–99)

## 2012-10-06 LAB — CBC
HCT: 36.5 % (ref 36.0–46.0)
MCH: 29.1 pg (ref 26.0–34.0)
MCV: 87.1 fL (ref 78.0–100.0)
Platelets: 347 10*3/uL (ref 150–400)
RBC: 4.19 MIL/uL (ref 3.87–5.11)
WBC: 15.1 10*3/uL — ABNORMAL HIGH (ref 4.0–10.5)

## 2012-10-06 MED ORDER — FLUOXETINE HCL 20 MG PO CAPS
40.0000 mg | ORAL_CAPSULE | Freq: Every day | ORAL | Status: DC
  Administered 2012-10-06 – 2012-10-07 (×2): 40 mg via ORAL
  Filled 2012-10-06 (×2): qty 2

## 2012-10-06 MED ORDER — TRAZODONE HCL 150 MG PO TABS
300.0000 mg | ORAL_TABLET | Freq: Every day | ORAL | Status: DC
  Administered 2012-10-06: 300 mg via ORAL
  Filled 2012-10-06 (×2): qty 2

## 2012-10-06 MED ORDER — DULOXETINE HCL 20 MG PO CPEP
40.0000 mg | ORAL_CAPSULE | Freq: Two times a day (BID) | ORAL | Status: DC
  Administered 2012-10-06 – 2012-10-07 (×2): 40 mg via ORAL
  Filled 2012-10-06 (×3): qty 2

## 2012-10-06 MED ORDER — INSULIN ASPART 100 UNIT/ML ~~LOC~~ SOLN
0.0000 [IU] | Freq: Three times a day (TID) | SUBCUTANEOUS | Status: DC
  Administered 2012-10-06: 3 [IU] via SUBCUTANEOUS

## 2012-10-06 MED ORDER — FUROSEMIDE 10 MG/ML IJ SOLN
40.0000 mg | Freq: Once | INTRAMUSCULAR | Status: AC
  Administered 2012-10-06: 40 mg via INTRAVENOUS
  Filled 2012-10-06: qty 4

## 2012-10-06 MED ORDER — QUETIAPINE FUMARATE 400 MG PO TABS
400.0000 mg | ORAL_TABLET | Freq: Every day | ORAL | Status: DC
  Administered 2012-10-06: 400 mg via ORAL
  Filled 2012-10-06 (×2): qty 1

## 2012-10-06 MED ORDER — POTASSIUM CHLORIDE CRYS ER 20 MEQ PO TBCR
40.0000 meq | EXTENDED_RELEASE_TABLET | Freq: Once | ORAL | Status: AC
  Administered 2012-10-06: 40 meq via ORAL
  Filled 2012-10-06: qty 2

## 2012-10-06 NOTE — Plan of Care (Signed)
Problem: Phase I Progression Outcomes Goal: Sedation Protocol initiated if indicated Outcome: Completed/Met Date Met:  10/05/12 Pt on continuous sedation of versed and fentanyl

## 2012-10-06 NOTE — Progress Notes (Signed)
PULMONARY  / CRITICAL CARE MEDICINE  Name: Jenna Hill MRN: [id number] DOB: [birth year]    ADMISSION DATE:  10/05/2012  REFERRING MD :  Gerhard Munch  CHIEF COMPLAINT:  Can't breath  BRIEF PATIENT DESCRIPTION:  42 y/o female reports insect bite on 9/3.  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  Had progressive dyspnea and throat swelling >> no improvement after home Epi-pen.  Required intubation in ED, and PCCM asked to admit.  SIGNIFICANT EVENTS: 9/04 - Admit ICU 9/05 - Extubation, no distress  STUDIES:  9/04 Echo >>EF 55-60%, nml wall motion, mild LVH, grade I diastolic dysfunction, PA Peak 27 9/04 UDS >> THC  LINES / TUBES: ETT 9/04 >>9/5  CULTURES: Sputum 9/04 >> Few GPC, Rare GNR Blood 9/04 >>  ANTIBIOTICS: Rocephin 9/04 >> 9/05 Zithromax 9/04 >> 9/05   SUBJECTIVE: RN reports no distress, weaning on 5/5  VITAL SIGNS: Temp:  [97.5 F (36.4 C)-98.6 F (37 C)] 98 F (36.7 C) (09/05 0800) Pulse Rate:  [58-97] 91 (09/05 0900) Resp:  [9-30] 13 (09/05 0900) BP: (81-164)/(49-119) 142/84 mmHg (09/05 0900) SpO2:  [95 %-100 %] 100 % (09/05 0900) FiO2 (%):  [30 %-50 %] 30 % (09/05 0900) Weight:  [180 lb (81.647 kg)-241 lb 2.9 oz (109.4 kg)] 241 lb 2.9 oz (109.4 kg) (09/05 0455)  VENTILATOR SETTINGS: Vent Mode:  [-] PRVC FiO2 (%):  [30 %-50 %] 30 % Set Rate:  [14 bmp] 14 bmp Vt Set:  [420 mL] 420 mL PEEP:  [5 cmH20] 5 cmH20 Plateau Pressure:  [12 cmH20-18 cmH20] 18 cmH20  INTAKE / OUTPUT: Intake/Output     09/04 0701 - 09/05 0700 09/05 0701 - 09/06 0700   I.V. (mL/kg) 1450.2 (13.3)    Other 20    IV Piggyback 900    Total Intake(mL/kg) 2370.2 (21.7)    Urine (mL/kg/hr) 1360    Total Output 1360     Net +1010.2          Urine Occurrence 1 x      PHYSICAL EXAMINATION: General: No distress Neuro:  Awake, alert, follows commands HEENT: no lip/tongue swelling, no LAN, no thyromegaly Cardiovascular:  Regular, no murmur Lungs:  B/l rhonchi, no  wheeze Abdomen:  Soft, non tender, decreased bowel sounds, surgical scars RUQ, no masses Musculoskeletal:  No edema Skin:  No rashes  LABS:  CBC Recent Labs     10/05/12  0902  10/06/12  0330  WBC  8.4  15.1*  HGB  12.2  12.2  HCT  36.2  36.5  PLT  350  347   Coag's Recent Labs     10/05/12  0902  INR  0.98   BMET Recent Labs     10/05/12  0902  10/06/12  0330  NA  139  137  K  3.5  3.7  CL  104  104  CO2  26  24  BUN  11  14  CREATININE  0.79  0.90  GLUCOSE  126*  138*   Electrolytes Recent Labs     10/05/12  0902  10/06/12  0330  CALCIUM  8.5  8.3*   Sepsis Markers Recent Labs     10/05/12  0902  10/06/12  0330  PROCALCITON  <0.10  <0.10    ABG Recent Labs     10/05/12  1040  PHART  7.344*  PCO2ART  41.8  PO2ART  127.0*    Liver Enzymes Recent Labs     10/05/12  0902  10/06/12  0330  AST  21  18  ALT  20  22  ALKPHOS  43  41  BILITOT  0.2*  0.2*  ALBUMIN  3.5  3.1*    Cardiac Enzymes Recent Labs     10/05/12  0902  10/05/12  1558  10/05/12  2148  TROPONINI  <0.30  <0.30  <0.30  PROBNP  15.2   --    --     Glucose Recent Labs     10/05/12  1137  10/05/12  1555  10/06/12  0127  10/06/12  0803  GLUCAP  116*  176*  118*  114*    Imaging Dg Chest Port 1 View  10/06/2012   *RADIOLOGY REPORT*  Clinical Data: Follow-up atelectasis.  PORTABLE CHEST - 1 VIEW  Comparison: Chest x-ray from yesterday.  Findings: Endotracheal tube ends 3 cm above the carina.  Gastric suction tube is new, coiled in the stomach.  Improved aeration diffusely.  No edema, effusion, pneumothorax. Mild cardiomegaly not changed.  IMPRESSION:  1.  Diffuse improvement in lung aeration. 2.  ETT and new nasogastric tube in good position.   Original Report Authenticated By: Tiburcio Pea   Dg Chest Port 1 View  10/05/2012   *RADIOLOGY REPORT*  Clinical Data: Shortness of breath  PORTABLE CHEST - 1 VIEW  Comparison: None.  Findings: An endotracheal tube is noted  in satisfactory position. Cardiac shadow is stable.  Diffuse bilateral infiltrates are identified.  These are most marked on the right.  No pneumothorax or sizable effusion is seen.  IMPRESSION: Diffuse bilateral infiltrates.   Original Report Authenticated By: Alcide Clever, M.D.    ASSESSMENT / PLAN:  PULMONARY A: Acute respiratory failure 2nd to angioedema with anaphylaxis with angioedema posterior pharynx. Diffuse pulmonary infiltrates.  UDS positive for benzos (likely from intubation) & THC. P:   -meets extubation criteria -continue solumedrol, pepcid, benadryl -prn albuterol -repeat lasix 9/6 x1   CARDIOVASCULAR A: Hx of HTN. Hypotension after intubation >> likely from effects of sedation >> resolved 9/05. P:  -monitor on tele -labetalol prn for SBP > 170  RENAL A: No issues. P:   -monitor renal fx, urine outpt, electrolytes  GASTROINTESTINAL A: Nutrition. P:   -NPO x 4 hours, then advance diet as tolerated  HEMATOLOGIC A: Leukocytosis >> likely from steroids. P:  -f/u CBC -SQ heparin for DVT prevention  INFECTIOUS A: Diffuse pulmonary infiltrates >> likely from negative pressure pulmonary edema in setting of upper airway edema rather than infection. P:   -d/c Abx 9/05 and monitor clinically  ENDOCRINE A: Steroid induced hyperglycemia.   P:   -SSI while on solumedrol  NEUROLOGIC A:  Sedation while on vent. P:   -d/c sedation protocol after extubation  CC time 35 minutes.  Canary Brim, NP-C [facility] Pgr: [phone] or [phone]  Reviewed above, examined pt, and agree with assessment/plan.  She has improved upper airway edema, and CXR appearance.  Tolerated extubation.  Clinical suspicion for infection very low >> will d/c Abx.  She reports that she keeps copper pipe in navel and under her pannus to ward off evil spirits.  Coralyn Helling, MD [facility] 10/06/2012, 10:15 AM Pager:  [phone] After 3pm call:  [phone]

## 2012-10-06 NOTE — Progress Notes (Signed)
eLink Physician-Brief Progress Note Patient Name: Jenna Hill DOB: [birth year] MRN: [id number]  Date of Service  10/06/2012   HPI/Events of Note   Patient wnts her home psych meds restarted  eICU Interventions  done   Intervention Category Minor Interventions: Routine modifications to care plan (e.g. PRN medications for pain, fever)  Brighid Koch 10/06/2012, 5:57 PM

## 2012-10-06 NOTE — Plan of Care (Signed)
Problem: Phase II Progression Outcomes Goal: Time pt extubated/weaned off vent Outcome: Completed/Met Date Met:  10/06/12 0950 am Goal: Code status re-addressed Outcome: Not Applicable Date Met:  10/06/12 Full code.  Stable. Progressing.

## 2012-10-06 NOTE — Progress Notes (Deleted)
Hypoglycemic Event  CBG: 57  Treatment: Apple juice Second Treatment: Coke cola  Symptoms: None   Follow-up CBG: Time:  0822 CBG Result:  41 Follow-up CBG: Time:  0842  CBG Result: 73  Possible Reasons for Event:   Sliding scale discontinued.  No insulin received this am.  Has not been receiving Lantus or other longer lasting insulin.  Comments/MD notified:  Dr. Arnetha Courser, Berlinda Last  Remember to initiate Hypoglycemia Order Set & complete

## 2012-10-06 NOTE — Procedures (Signed)
Extubation Procedure Note  Patient Details:   Name: Jenna Hill DOB: Aug 26, 1970 MRN: [id number]   Airway Documentation:     Evaluation  O2 sats: 100 Complications: none Patient tolerated procedure well. Bilateral Breath Sounds: Diminished Suctioning: Airway Pt able to speak  Per CCM order extubated pt.  Pt tolerated well, stable throughout the procedure. Placed on 3L nasal cannula.  Revonda Humphrey 10/06/2012, 10:09 AM

## 2012-10-06 NOTE — Plan of Care (Signed)
Problem: Phase I Progression Outcomes Goal: GIProphysixis Outcome: Completed/Met Date Met:  10/06/12 IV pepcid.

## 2012-10-07 ENCOUNTER — Inpatient Hospital Stay (HOSPITAL_COMMUNITY): Payer: MEDICAID

## 2012-10-07 LAB — BASIC METABOLIC PANEL
Calcium: 8.3 mg/dL — ABNORMAL LOW (ref 8.4–10.5)
Chloride: 105 mEq/L (ref 96–112)
Creatinine, Ser: 0.84 mg/dL (ref 0.50–1.10)
GFR calc Af Amer: >90 mL/min (ref >90)
Sodium: 139 mEq/L (ref 135–145)

## 2012-10-07 LAB — CBC
HCT: 33.7 % — ABNORMAL LOW (ref 36.0–46.0)
Platelets: 351 10*3/uL (ref 150–400)
RBC: 3.9 MIL/uL (ref 3.87–5.11)
RDW: 13.4 % (ref 11.5–15.5)
WBC: 19.8 10*3/uL — ABNORMAL HIGH (ref 4.0–10.5)

## 2012-10-07 LAB — GLUCOSE, CAPILLARY
Glucose-Capillary: 120 mg/dL — ABNORMAL HIGH (ref 70–99)
Glucose-Capillary: 106 mg/dL — ABNORMAL HIGH (ref 70–99)

## 2012-10-07 MED ORDER — PREDNISONE 10 MG PO TABS: ORAL_TABLET | ORAL | Status: DC

## 2012-10-07 MED ORDER — FAMOTIDINE 20 MG PO TABS: 20.0000 mg | ORAL_TABLET | Freq: Two times a day (BID) | ORAL | Status: DC

## 2012-10-07 MED ORDER — PREDNISONE 20 MG PO TABS
40.0000 mg | ORAL_TABLET | Freq: Once | ORAL | Status: AC
  Administered 2012-10-07: 40 mg via ORAL
  Filled 2012-10-07: qty 2

## 2012-10-07 MED ORDER — PREDNISONE 10 MG PO TABS: 10.0000 mg | ORAL_TABLET | Freq: Every day | ORAL | Status: DC

## 2012-10-07 MED ORDER — FAMOTIDINE 20 MG PO TABS
20.0000 mg | ORAL_TABLET | Freq: Once | ORAL | Status: AC
  Administered 2012-10-07: 20 mg via ORAL
  Filled 2012-10-07: qty 1

## 2012-10-07 MED ORDER — DIPHENHYDRAMINE HCL 25 MG PO CAPS: 25.0000 mg | ORAL_CAPSULE | ORAL | Status: DC | PRN

## 2012-10-07 MED ORDER — DIPHENHYDRAMINE HCL 25 MG PO CAPS
25.0000 mg | ORAL_CAPSULE | Freq: Once | ORAL | Status: AC
  Administered 2012-10-07: 25 mg via ORAL
  Filled 2012-10-07: qty 1

## 2012-10-07 NOTE — Discharge Summary (Signed)
Physician Discharge Summary     Patient ID: Jenna Hill MRN: [id number] DOB/AGE: 1970/05/13 42 y.o.  Admit date: 10/05/2012 Discharge date: 10/07/2012  Discharge Diagnoses:  Principal Problem:   Allergic angioedema Active Problems:   Acute respiratory failure   Anaphylaxis   Hypertension   Detailed Hospital Course:  42 y/o female reports insect bite on 9/3. 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 Had progressive dyspnea and throat swelling >> no improvement after home Epi-pen. Required intubation in ED, and PCCM asked to admit.   The pt found to have angioedema and hives. Prior hx of allergy to insects. Pt improved rapidly with H2 blocker, steroids, antihistamine.  Pt ready for d/c AM 10/07/12. Note elevated WBC is d/t steroids  SIGNIFICANT EVENTS:  9/04 - Admit ICU  9/05 - Extubation, no distress  STUDIES:  9/04 Echo >>EF 55-60%, nml wall motion, mild LVH, grade I diastolic dysfunction, PA Peak 27  9/04 UDS >> THC  LINES / TUBES:  ETT 9/04 >>9/5  CULTURES:  Sputum 9/04 >> Few GPC, Rare GNR  Blood 9/04 >>  ANTIBIOTICS:  Rocephin 9/04 >> 9/05  Zithromax 9/04 >> 9/05    Discharge Plan by diagnoses   Discharge Plan: Plan to f/u in 3 days at [facility] office with NP Tammy Parrett.   D/c on pepcid/steroid/benadryl    Discharge Exam: BP 130/88  Pulse 81  Temp(Src) 97.9 F (36.6 C) (Oral)  Resp 19  Ht 5\' 5"  (1.651 m)  Wt 107.5 kg (236 lb 15.9 oz)  BMI 39.44 kg/m2  SpO2 98%  Filed Vitals:   10/07/12 0200 10/07/12 0400 10/07/12 0500 10/07/12 0600  BP: 116/77 133/87  130/88  Pulse: 86 86  81  Temp:  97.9 F (36.6 C)    TempSrc:  Oral    Resp: 22 18  19   Height:      Weight:   107.5 kg (236 lb 15.9 oz)   SpO2: 97% 97%  98%    Gen: Pleasant, well-nourished, in no distress,  normal affect  ENT: No lesions,  mouth clear,  oropharynx clear, no postnasal drip Tongue now normal.  No edema  Neck: No JVD, no TMG, no carotid bruits  Lungs: No use of accessory  muscles, no dullness to percussion, clear without rales or rhonchi  Cardiovascular: RRR, heart sounds normal, no murmur or gallops, no peripheral edema  Abdomen: soft and NT, no HSM,  BS normal  Musculoskeletal: No deformities, no cyanosis or clubbing  Neuro: alert, non focal  Skin: Warm, no lesions or rashes, hives resolved    Labs at discharge Lab Results  Component Value Date   CREATININE 0.84 10/07/2012   BUN 18 10/07/2012   NA 139 10/07/2012   K 3.6 10/07/2012   CL 105 10/07/2012   CO2 26 10/07/2012   Lab Results  Component Value Date   WBC 19.8* 10/07/2012   HGB 11.5* 10/07/2012   HCT 33.7* 10/07/2012   MCV 86.4 10/07/2012   PLT 351 10/07/2012   Lab Results  Component Value Date   ALT 22 10/06/2012   AST 18 10/06/2012   ALKPHOS 41 10/06/2012   BILITOT 0.2* 10/06/2012   Lab Results  Component Value Date   INR 0.98 10/05/2012    Current radiology studies Dg Chest Port 1 View  10/06/2012   *RADIOLOGY REPORT*  Clinical Data: Follow-up atelectasis.  PORTABLE CHEST - 1 VIEW  Comparison: Chest x-ray from yesterday.  Findings: Endotracheal tube ends 3 cm above the carina.  Gastric suction tube is new, coiled in the stomach.  Improved aeration diffusely.  No edema, effusion, pneumothorax. Mild cardiomegaly not changed.  IMPRESSION:  1.  Diffuse improvement in lung aeration. 2.  ETT and new nasogastric tube in good position.   Original Report Authenticated By: Tiburcio Pea   Dg Chest Port 1 View  10/05/2012   *RADIOLOGY REPORT*  Clinical Data: Shortness of breath  PORTABLE CHEST - 1 VIEW  Comparison: None.  Findings: An endotracheal tube is noted in satisfactory position. Cardiac shadow is stable.  Diffuse bilateral infiltrates are identified.  These are most marked on the right.  No pneumothorax or sizable effusion is seen.  IMPRESSION: Diffuse bilateral infiltrates.   Original Report Authenticated By: Alcide Clever, M.D.    Disposition:  01-Home or Self Care  Discharge Orders   Future Orders  Complete By Expires   Discharge patient  As directed    Discontinue IV  As directed        Medication List         diphenhydrAMINE 25 mg capsule  Commonly known as:  BENADRYL  Take 1 capsule (25 mg total) by mouth every 4 (four) hours as needed for itching.     DULoxetine 20 MG capsule  Commonly known as:  CYMBALTA  Take 40 mg by mouth 2 (two) times daily.     EPINEPHrine 0.3 mg/0.3 mL Soaj injection  Commonly known as:  EPI-PEN  Inject 0.3 mg into the muscle once.     famotidine 20 MG tablet  Commonly known as:  PEPCID  Take 1 tablet (20 mg total) by mouth 2 (two) times daily. Till gone     FLUoxetine 40 MG capsule  Commonly known as:  PROZAC  Take 40 mg by mouth daily.     predniSONE 10 MG tablet  Commonly known as:  DELTASONE  Take 4 for three days 3 for three days 2 for three days 1 for three days and stop     QUEtiapine 400 MG tablet  Commonly known as:  SEROQUEL  Take 400 mg by mouth at bedtime.     trazodone 300 MG tablet  Commonly known as:  DESYREL  Take 300 mg by mouth at bedtime.           Follow-up Information   Follow up with PARRETT,TAMMY, NP. Schedule an appointment as soon as possible for a visit in 3 days.   Specialty:  Nurse Practitioner   Contact information:   [address] [place] [address] [phone]       Discharged Condition: good  Physician Statement:   The Patient was personally examined.  > 30 minutes of time have been dedicated to discharge assessment, planning and discharge instructions.   Signed: Dorcas Carrow Beeper  [phone]  Cell  [phone]  If no response or cell goes to voicemail, call beeper [phone]  10/07/2012, 8:17 AM

## 2012-10-09 ENCOUNTER — Telehealth: Payer: Self-pay | Admitting: Adult Health

## 2012-10-09 LAB — CULTURE, RESPIRATORY W GRAM STAIN

## 2012-10-09 NOTE — Telephone Encounter (Signed)
Please make a post hospital follow up with me in office this week

## 2012-10-09 NOTE — Telephone Encounter (Signed)
Called spoke with patient HFU scheduled with TP 9.11.14 @ 4pm Pt okay with this date and time Nothing further needed; will sign off.

## 2012-10-11 LAB — CULTURE, BLOOD (ROUTINE X 2): Culture: NO GROWTH

## 2012-10-12 ENCOUNTER — Inpatient Hospital Stay: Payer: Self-pay | Admitting: Adult Health

## 2012-10-23 ENCOUNTER — Inpatient Hospital Stay: Payer: Self-pay | Admitting: Adult Health

## 2012-11-01 ENCOUNTER — Inpatient Hospital Stay: Payer: Self-pay | Admitting: Adult Health

## 2015-04-05 ENCOUNTER — Emergency Department (HOSPITAL_COMMUNITY)
Admission: EM | Admit: 2015-04-05 | Discharge: 2015-04-05 | Disposition: A | Discharge status: Home / Self Care | Payer: Medicaid Other | Attending: Emergency Medicine | Admitting: Emergency Medicine

## 2015-04-05 ENCOUNTER — Encounter (HOSPITAL_COMMUNITY): Payer: Self-pay | Admitting: Emergency Medicine

## 2015-04-05 ENCOUNTER — Emergency Department (HOSPITAL_COMMUNITY): Admission: EM | Admit: 2015-04-05 | Discharge: 2015-04-05 | Discharge status: Absent without leave | Payer: Self-pay

## 2015-04-05 ENCOUNTER — Emergency Department (HOSPITAL_COMMUNITY): Payer: Medicaid Other

## 2015-04-05 DIAGNOSIS — S0990XA Unspecified injury of head, initial encounter: Secondary | ICD-10-CM | POA: Insufficient documentation

## 2015-04-05 DIAGNOSIS — S93402A Sprain of unspecified ligament of left ankle, initial encounter: Secondary | ICD-10-CM | POA: Insufficient documentation

## 2015-04-05 DIAGNOSIS — Z91040 Latex allergy status: Secondary | ICD-10-CM | POA: Diagnosis not present

## 2015-04-05 DIAGNOSIS — S93401A Sprain of unspecified ligament of right ankle, initial encounter: Secondary | ICD-10-CM | POA: Diagnosis not present

## 2015-04-05 DIAGNOSIS — S93601A Unspecified sprain of right foot, initial encounter: Secondary | ICD-10-CM | POA: Insufficient documentation

## 2015-04-05 DIAGNOSIS — T402X4A Poisoning by other opioids, undetermined, initial encounter: Secondary | ICD-10-CM | POA: Diagnosis not present

## 2015-04-05 DIAGNOSIS — Z7952 Long term (current) use of systemic steroids: Secondary | ICD-10-CM | POA: Insufficient documentation

## 2015-04-05 DIAGNOSIS — W19XXXA Unspecified fall, initial encounter: Secondary | ICD-10-CM

## 2015-04-05 DIAGNOSIS — Z8709 Personal history of other diseases of the respiratory system: Secondary | ICD-10-CM | POA: Diagnosis not present

## 2015-04-05 DIAGNOSIS — X509XXA Other and unspecified overexertion or strenuous movements or postures, initial encounter: Secondary | ICD-10-CM | POA: Diagnosis not present

## 2015-04-05 DIAGNOSIS — S93602A Unspecified sprain of left foot, initial encounter: Secondary | ICD-10-CM

## 2015-04-05 DIAGNOSIS — T391X4A Poisoning by 4-Aminophenol derivatives, undetermined, initial encounter: Secondary | ICD-10-CM | POA: Insufficient documentation

## 2015-04-05 DIAGNOSIS — Y999 Unspecified external cause status: Secondary | ICD-10-CM | POA: Insufficient documentation

## 2015-04-05 DIAGNOSIS — I10 Essential (primary) hypertension: Secondary | ICD-10-CM | POA: Insufficient documentation

## 2015-04-05 DIAGNOSIS — Z79899 Other long term (current) drug therapy: Secondary | ICD-10-CM | POA: Insufficient documentation

## 2015-04-05 DIAGNOSIS — S8392XA Sprain of unspecified site of left knee, initial encounter: Secondary | ICD-10-CM | POA: Insufficient documentation

## 2015-04-05 DIAGNOSIS — R52 Pain, unspecified: Secondary | ICD-10-CM

## 2015-04-05 DIAGNOSIS — S299XXA Unspecified injury of thorax, initial encounter: Secondary | ICD-10-CM | POA: Diagnosis not present

## 2015-04-05 DIAGNOSIS — S199XXA Unspecified injury of neck, initial encounter: Secondary | ICD-10-CM | POA: Diagnosis not present

## 2015-04-05 DIAGNOSIS — Y929 Unspecified place or not applicable: Secondary | ICD-10-CM | POA: Diagnosis not present

## 2015-04-05 DIAGNOSIS — Y9301 Activity, walking, marching and hiking: Secondary | ICD-10-CM | POA: Insufficient documentation

## 2015-04-05 DIAGNOSIS — S99922A Unspecified injury of left foot, initial encounter: Secondary | ICD-10-CM | POA: Diagnosis present

## 2015-04-05 MED ORDER — ACETAMINOPHEN 325 MG PO TABS
650.0000 mg | ORAL_TABLET | Freq: Once | ORAL | Status: AC
  Administered 2015-04-05: 650 mg via ORAL
  Filled 2015-04-05: qty 2

## 2015-04-05 NOTE — ED Notes (Addendum)
Pt fell around 9pm, took the codeine, two advil and 3 benadryl around 9:30, administered the epi-pen at 15 minutes later(hives).  Pt also seen responding to internal stimuli, when asked she replyed that "I'm schizophrenic.Marland KitchenMarland KitchenI take medication"

## 2015-04-05 NOTE — ED Provider Notes (Signed)
CSN: [id number]     Arrival date & time 04/05/15  0311 History  By signing my name below, I, Jenna Hill, attest that this documentation has been prepared under the direction and in the presence of Ripley Fraise, MD. Electronically Signed: Julien Nordmann, ED Scribe. 04/05/2015. 3:50 AM.     Chief Complaint  Patient presents with  . Allergic Reaction  . Fall      Patient is a 45 y.o. female presenting with allergic reaction and fall. The history is provided by the patient. No language interpreter was used.  Allergic Reaction Presenting symptoms: rash   Presenting symptoms: no difficulty swallowing   Severity:  Mild Prior allergic episodes:  Allergies to medications Context: medications   Relieved by:  Antihistamines Worsened by:  Nothing tried Fall Associated symptoms include chest pain.   HPI Comments: Jenna Hill is a 45 y.o. female who has a hx of HTN presents to the Emergency Department complaining of a constant, gradual worsening allergic reaction and fall that occurred this evening. She endorses mild neck pain, back pain, chest tightness, and headache. Pt states she was walking up some steps when she fell. She twisted both ankles and injured her left knee. She says she took some tylenol with codeine to alleviate her pain from the fall when she began to break out immediately in hives. Pt says she used her epi-pen to alleviate her symptoms with relief. She notes she was placed in respiratory failure 4 years ago for a similar allergic reaction when she did not use her epi-pen. She denies vomiting and difficulty swallowing.  Past Medical History  Diagnosis Date  . Hypertension    History reviewed. No pertinent past surgical history. No family history on file. Social History  Substance Use Topics  . Smoking status: Never Smoker   . Smokeless tobacco: Never Used  . Alcohol Use: Yes   OB History    No data available     Review of Systems  HENT: Negative for  trouble swallowing.   Cardiovascular: Positive for chest pain.  Gastrointestinal: Negative for vomiting.  Musculoskeletal: Positive for back pain and neck pain.  Skin: Positive for rash.  All other systems reviewed and are negative.     Allergies  Bee venom; Codeine; Other; and Latex  Home Medications   Prior to Admission medications   Medication Sig Start Date End Date Taking? Authorizing Provider  diphenhydrAMINE (BENADRYL) 25 mg capsule Take 1 capsule (25 mg total) by mouth every 4 (four) hours as needed for itching. 10/07/12   Elsie Stain, MD  DULoxetine (CYMBALTA) 20 MG capsule Take 40 mg by mouth 2 (two) times daily.    Historical Provider, MD  EPINEPHrine (EPI-PEN) 0.3 mg/0.3 mL SOAJ injection Inject 0.3 mg into the muscle once.    Historical Provider, MD  famotidine (PEPCID) 20 MG tablet Take 1 tablet (20 mg total) by mouth 2 (two) times daily. Till gone 10/07/12   Elsie Stain, MD  FLUoxetine (PROZAC) 40 MG capsule Take 40 mg by mouth daily.    Historical Provider, MD  predniSONE (DELTASONE) 10 MG tablet Take 4 for three days 3 for three days 2 for three days 1 for three days and stop 10/07/12   Elsie Stain, MD  QUEtiapine (SEROQUEL) 400 MG tablet Take 400 mg by mouth at bedtime.    Historical Provider, MD  trazodone (DESYREL) 300 MG tablet Take 300 mg by mouth at bedtime.    Historical Provider, MD  Triage vitals: BP 183/120 mmHg  Pulse 121  Temp(Src) 98.1 F (36.7 C)  Resp 20  Ht 5' (1.524 m)  Wt 231 lb 3 oz (104.866 kg)  BMI 45.15 kg/m2  SpO2 99% Physical Exam CONSTITUTIONAL: Well developed/well nourished HEAD: Normocephalic/atraumatic EYES: EOMI/PERRL ENMT: Mucous membranes moist, no angioedema NECK: supple no meningeal signs SPINE/BACK:entire spine nontender CV: S1/S2 noted, no murmurs/rubs/gallops noted LUNGS: Lungs are clear to auscultation bilaterally, no apparent distress ABDOMEN: soft, nontender, no rebound or guarding, bowel sounds noted  throughout abdomen GU:no cva tenderness NEURO: Pt is awake/alert/appropriate, moves all extremitiesx4.  No facial droop.   EXTREMITIES: pulses normal/equal, full ROM, no deformities noted, tenderness to right foot, right ankle, left ankle, left knee and left foot. Full ROM of both hips SKIN: warm, color normal, no rash PSYCH: flat affect  ED Course  Procedures  DIAGNOSTIC STUDIES: Oxygen Saturation is 99% on RA, normal by my interpretation.  COORDINATION OF CARE:  3:48 AM Discussed treatment plan with pt at bedside and pt agreed to plan.  6:36 AM Imaging negative Pt can ambulate No signs of allergic reaction here She is in no distress  BP 156/104 mmHg  Pulse 85  Temp(Src) 98.2 F (36.8 C) (Oral)  Resp 16  Ht 5' (1.524 m)  Wt 104.866 kg  BMI 45.15 kg/m2  SpO2 98%  LMP 03/27/2015 Stable for d/c home  Imaging Review Dg Ankle Complete Left  04/05/2015  CLINICAL DATA:  Status post fall while going up stairs, with left ankle pain. Initial encounter. EXAM: LEFT ANKLE COMPLETE - 3+ VIEW COMPARISON:  None. FINDINGS: There is no evidence of fracture or dislocation. The ankle mortise is intact; the interosseous space is within normal limits. No talar tilt or subluxation is seen. A small posterior calcaneal spur is noted. The joint spaces are preserved. Lateral soft tissue swelling is noted at the ankle. IMPRESSION: No evidence of fracture or dislocation. Electronically Signed   By: Garald Balding M.D.   On: 04/05/2015 04:52   Dg Ankle Complete Right  04/05/2015  CLINICAL DATA:  Status post fall while going up stairs, with right ankle pain. Initial encounter. EXAM: RIGHT ANKLE - COMPLETE 3+ VIEW COMPARISON:  None. FINDINGS: There is no evidence of fracture or dislocation. The ankle mortise is intact; degenerative change is noted about the interosseous space, likely reflecting remote injury and mild heterotopic bone formation. No talar tilt or subluxation is seen. The joint spaces are preserved.  Lateral soft tissue swelling is noted at the ankle. IMPRESSION: No evidence of fracture or dislocation. Electronically Signed   By: Garald Balding M.D.   On: 04/05/2015 04:49   Dg Knee Complete 4 Views Left  04/05/2015  CLINICAL DATA:  Status post fall while going up stairs, with left knee pain. Initial encounter. EXAM: LEFT KNEE - COMPLETE 4+ VIEW COMPARISON:  None. FINDINGS: There is no evidence of fracture or dislocation. The joint spaces are preserved. Mild medial marginal osteophytes are noted. A small knee joint effusion is noted. The visualized soft tissues are normal in appearance. A fabella is seen. IMPRESSION: 1. No evidence of fracture or dislocation. 2. Small knee joint effusion noted. 3. Minimal degenerative change at the medial compartment. Electronically Signed   By: Garald Balding M.D.   On: 04/05/2015 04:50   Dg Foot Complete Left  04/05/2015  CLINICAL DATA:  Status post fall while going up stairs, with left foot pain. Initial encounter. EXAM: LEFT FOOT - COMPLETE 3+ VIEW COMPARISON:  None. FINDINGS:  There is no evidence of fracture or dislocation. The joint spaces are preserved. There is no evidence of talar subluxation; the subtalar joint is unremarkable in appearance. Mild soft tissue swelling is suggested about the forefoot. IMPRESSION: No evidence of fracture or dislocation. Electronically Signed   By: Garald Balding M.D.   On: 04/05/2015 04:51   Dg Foot Complete Right  04/05/2015  CLINICAL DATA:  Status post fall 1 day ago while going up stairs, with right foot pain. Initial encounter. EXAM: RIGHT FOOT COMPLETE - 3+ VIEW COMPARISON:  None. FINDINGS: There is no evidence of fracture or dislocation. The joint spaces are preserved. There is no evidence of talar subluxation; the subtalar joint is unremarkable in appearance. Mild soft tissue swelling is suggested at the forefoot. IMPRESSION: No evidence of fracture or dislocation. Electronically Signed   By: Garald Balding M.D.   On:  04/05/2015 04:48   I have personally reviewed and evaluated these images results as part of my medical decision-making.   MDM   Final diagnoses:  Pain  Fall, initial encounter  Sprain of left foot, initial encounter  Sprain of left ankle, initial encounter  Sprain of right foot, initial encounter  Sprain of right ankle, initial encounter  Sprain of left knee, initial encounter    Nursing notes including past medical history and social history reviewed and considered in documentation xrays/imaging reviewed by myself and considered during evaluation   I personally performed the services described in this documentation, which was scribed in my presence. The recorded information has been reviewed and is accurate.      Ripley Fraise, MD 04/05/15 [phone]

## 2015-04-05 NOTE — Discharge Instructions (Signed)
Ankle Sprain °An ankle sprain is an injury to the strong, fibrous tissues (ligaments) that hold your ankle bones together.  °HOME CARE  °· Put ice on your ankle for 1-2 days or as told by your doctor. °¨ Put ice in a plastic bag. °¨ Place a towel between your skin and the bag. °¨ Leave the ice on for 15-20 minutes at a time, every 2 hours while you are awake. °· Only take medicine as told by your doctor. °· Raise (elevate) your injured ankle above the level of your heart as much as possible for 2-3 days. °· Use crutches if your doctor tells you to. Slowly put your own weight on the affected ankle. Use the crutches until you can walk without pain. °· If you have a plaster splint: °¨ Do not rest it on anything harder than a pillow for 24 hours. °¨ Do not put weight on it. °¨ Do not get it wet. °¨ Take it off to shower or bathe. °· If given, use an elastic wrap or support stocking for support. Take the wrap off if your toes lose feeling (numb), tingle, or turn cold or blue. °· If you have an air splint: °¨ Add or let out air to make it comfortable. °¨ Take it off at night and to shower and bathe. °¨ Wiggle your toes and move your ankle up and down often while you are wearing it. °GET HELP IF: °· You have rapidly increasing bruising or puffiness (swelling). °· Your toes feel very cold. °· You lose feeling in your foot. °· Your medicine does not help your pain. °GET HELP RIGHT AWAY IF:  °· Your toes lose feeling (numb) or turn blue. °· You have severe pain that is increasing. °MAKE SURE YOU:  °· Understand these instructions. °· Will watch your condition. °· Will get help right away if you are not doing well or get worse. °  °This information is not intended to replace advice given to you by your health care provider. Make sure you discuss any questions you have with your health care provider. °  °Document Released: 07/07/2007 Document Revised: 02/08/2014 Document Reviewed: 08/02/2011 °Elsevier Interactive Patient  Education ©2016 Elsevier Inc. ° °

## 2015-04-05 NOTE — ED Notes (Signed)
MD at bedside. 

## 2015-04-05 NOTE — ED Notes (Signed)
Pt transported to XRAY °

## 2015-04-05 NOTE — ED Notes (Signed)
Pt ambulated self to the bathroom w/o assistance.

## 2015-04-05 NOTE — ED Notes (Signed)
Pt reports she fell going up stairs earlier in the day on Friday.  C/o pain to bilateral ankles and L knee.  Took Tylenol with Codeine tonight for pain and reports hives all over.  Pt has known allergy to Codeine but states she didn't have anything else to take for pain.  Pt used Epi-Pen just prior to arrival and states she now feels jittery.

## 2015-04-11 ENCOUNTER — Emergency Department (HOSPITAL_COMMUNITY)
Admission: EM | Admit: 2015-04-11 | Discharge: 2015-04-11 | Discharge status: Against Medical Advice | Payer: Medicaid Other | Attending: Emergency Medicine | Admitting: Emergency Medicine

## 2015-04-11 ENCOUNTER — Encounter (HOSPITAL_COMMUNITY): Payer: Self-pay | Admitting: Emergency Medicine

## 2015-04-11 ENCOUNTER — Emergency Department (HOSPITAL_BASED_OUTPATIENT_CLINIC_OR_DEPARTMENT_OTHER): Payer: Medicaid Other

## 2015-04-11 DIAGNOSIS — I10 Essential (primary) hypertension: Secondary | ICD-10-CM | POA: Diagnosis not present

## 2015-04-11 DIAGNOSIS — Y998 Other external cause status: Secondary | ICD-10-CM | POA: Diagnosis not present

## 2015-04-11 DIAGNOSIS — M79605 Pain in left leg: Secondary | ICD-10-CM

## 2015-04-11 DIAGNOSIS — S8992XA Unspecified injury of left lower leg, initial encounter: Secondary | ICD-10-CM | POA: Diagnosis present

## 2015-04-11 DIAGNOSIS — Y9289 Other specified places as the place of occurrence of the external cause: Secondary | ICD-10-CM | POA: Diagnosis not present

## 2015-04-11 DIAGNOSIS — M25562 Pain in left knee: Secondary | ICD-10-CM

## 2015-04-11 DIAGNOSIS — Y9301 Activity, walking, marching and hiking: Secondary | ICD-10-CM | POA: Diagnosis not present

## 2015-04-11 DIAGNOSIS — M79609 Pain in unspecified limb: Secondary | ICD-10-CM

## 2015-04-11 DIAGNOSIS — W108XXA Fall (on) (from) other stairs and steps, initial encounter: Secondary | ICD-10-CM | POA: Insufficient documentation

## 2015-04-11 LAB — I-STAT TROPONIN, ED: Troponin i, poc: 0 ng/mL (ref 0.00–0.08)

## 2015-04-11 MED ORDER — KETOROLAC TROMETHAMINE 30 MG/ML IJ SOLN
30.0000 mg | Freq: Once | INTRAMUSCULAR | Status: AC
  Administered 2015-04-11: 30 mg via INTRAMUSCULAR
  Filled 2015-04-11: qty 1

## 2015-04-11 MED ORDER — EPINEPHRINE 0.3 MG/0.3ML IJ SOAJ
0.3000 mg | Freq: Once | INTRAMUSCULAR | Status: AC
  Administered 2015-04-11: 0.3 mg via INTRAMUSCULAR
  Filled 2015-04-11: qty 0.3

## 2015-04-11 NOTE — ED Notes (Addendum)
Pt c/o left knee pain. States it is "red and hot". States it has "buckled" several times.Pt to get undressed. Was here 04/05/15 for a fall. No new falls.

## 2015-04-11 NOTE — ED Provider Notes (Signed)
Patient care assumed from [facility], PA-C at shift change pending DVT ultrasound results. For full history of present illness please see note by initial provider.  In short, patient presenting with left knee pain, warmth and swelling. Venous ultrasound ordered to evaluate for DVT. Preliminary result negative and final read by radiology pending. Patient states that she did not wish to wait for the official radiology report and left the emergency department. Patient left prior to reevaluation and before I was able to discuss the importance of waiting for official read by radiology. Patient's disposition is AMA.  Lahoma Crocker Shiann Kam, PA-C 04/11/15 [place], MD 04/12/15 [phone]

## 2015-04-11 NOTE — ED Notes (Signed)
Informed vascular

## 2015-04-11 NOTE — ED Provider Notes (Signed)
CSN: [id number]     Arrival date & time 04/11/15  1038 History  By signing my name below, I, [name], attest that this documentation has been prepared under the direction and in the presence of Comer Locket, PA-C Electronically Signed: Ladene Artist, ED Scribe 04/11/2015 at 1:03 PM.   Chief Complaint  Patient presents with  . Knee Injury  . Ankle Injury   The history is provided by the patient. No language interpreter was used.   HPI Comments: Jenna Hill is a 45 y.o. female who presents to the Emergency Department complaining of gradually worsening left leg pain s/p a fall that occurred 6 days ago and again yesterday. Pt was seen in the ED on 04/05/15 following fall; negative XRs. She states that she was walking down stairs yesterday when her left leg buckled and she heard a "pop", followed by increased left knee pain. She reports pain that begins in her left thigh and radiates into her left foot. Pain is exacerbated with palpation and movement. She further reports gradually improving swelling to the left knee onset yesterday. Pt takes Neurontin daily but denies any relief with medication. Pt denies anticoagulant or antiplatelet use. She also denies SOB, cough, hemoptysis, abdominal pain. No h/o DM.  No h/o DM. No h/o DVT/PE or leg swelling.  Past Medical History  Diagnosis Date  . Hypertension    History reviewed. No pertinent past surgical history. No family history on file. Social History  Substance Use Topics  . Smoking status: Never Smoker   . Smokeless tobacco: Never Used  . Alcohol Use: Yes   OB History    No data available     Review of Systems  Respiratory: Negative for cough and shortness of breath.   Gastrointestinal: Negative for abdominal pain.  Musculoskeletal: Positive for joint swelling and arthralgias.  All other systems reviewed and are negative.  Allergies  Bee venom; Codeine; Other; and Latex  Home Medications   Prior to Admission  medications   Medication Sig Start Date End Date Taking? Authorizing Provider  diphenhydrAMINE (BENADRYL) 25 mg capsule Take 1 capsule (25 mg total) by mouth every 4 (four) hours as needed for itching. 10/07/12   Elsie Stain, MD  EPINEPHrine (EPI-PEN) 0.3 mg/0.3 mL SOAJ injection Inject 0.3 mg into the muscle daily as needed (allergic reaction).     Historical Provider, MD  ibuprofen (ADVIL,MOTRIN) 200 MG tablet Take 600 mg by mouth every 6 (six) hours as needed for moderate pain.    Historical Provider, MD   BP 140/88 mmHg  Pulse 87  Temp(Src) 97.9 F (36.6 C) (Oral)  Resp 16  Wt 231 lb (104.781 kg)  SpO2 98%  LMP 03/27/2015 Physical Exam  Constitutional: She is oriented to person, place, and time. She appears well-developed and well-nourished. No distress.  HENT:  Head: Normocephalic and atraumatic.  Eyes: Conjunctivae and EOM are normal.  Neck: Neck supple. No tracheal deviation present.  Cardiovascular: Normal rate, regular rhythm and normal heart sounds.   Pulmonary/Chest: Effort normal and breath sounds normal. No respiratory distress.  Abdominal: Soft. There is no tenderness.  Musculoskeletal: Normal range of motion.  Tenderness throughout L calf  Neurological: She is alert and oriented to person, place, and time.  Skin: Skin is warm and dry.  Psychiatric: She has a normal mood and affect. Her behavior is normal.  Nursing note and vitals reviewed.  ED Course  Procedures (including critical care time) DIAGNOSTIC STUDIES: Oxygen Saturation is 98% on RA,  normal by my interpretation.    COORDINATION OF CARE: 12:14 PM-Discussed treatment plan which includes I-stat, Toradol injection, epi-pen and US doppler with pt at bedside and pt agreed to plan.   Labs Review Labs Reviewed  I-STAT CHEM 8, ED   Imaging Review No results found. I have personally reviewed and evaluated these images and lab results as part of my medical decision-making.   EKG Interpretation None      12:50pm--patient reports she is having an allergic reaction to Toradol injection. She is sitting comfortably in the chair and sticking her tongue out. Vital signs have remained stable. She was administered epinephrine IM. Her Tongue then spontaneously returned to normal positioning Immediately and patient is talking clearly. Low suspicion for anaphylactic reaction. Patient also admits to taking Motrin earlier today Without any adverse reaction.   MDM  Jenna Hill is a 45 y.o. female who presents today for evaluation of left knee and leg pain. Patient reports subjective swelling and left leg as well as warmth and pain with movement. Patient does not comply fully with physical exam. Plan to obtain venous ultrasound to evaluate for DVT. Pulmonary ultrasound reading is negative. However will sign out to oncoming provider, Tamsen Meek. PA-C, for follow-up on definitive ultrasound reading and subsequent discharge. Final diagnoses:  Left knee pain  Left leg pain    I personally performed the services described in this documentation, which was scribed in my presence. The recorded information has been reviewed and is accurate.    Comer Locket, PA-C 04/12/15 [place], MD 04/13/15 [phone]

## 2015-04-11 NOTE — ED Notes (Signed)
Pt's tongue now back in mouth in normal position. States she has EpiPen because she is allergic to Codeine and Bee Stings. States last used her EpiPen the beginning of the month because she took Codeine.

## 2015-04-11 NOTE — Progress Notes (Signed)
Preliminary report by tech - Left lower ext. Venous duplex completed. Negative for deep and superficial vein thrombosis. Oda Cogan, BS, RDMS, RVT

## 2015-04-11 NOTE — Discharge Instructions (Signed)
There does not appear to be an emergent cause for your leg pain at this time. Your ultrasound was negative for any blood clots. You may follow-up with orthopedics or your doctor for further evaluation and management of your leg pain. Wear your brace as we discussed. Return to ED for new or worsening symptoms as we discussed.  How to Use a Knee Brace A knee brace is a device that you wear to support your knee, especially if the knee is healing after an injury or surgery. There are several types of knee braces. Some are designed to prevent an injury (prophylactic brace). These are often worn during sports. Others support an injured knee (functional brace) or keep it still while it heals (rehabilitative brace). People with severe arthritis of the knee may benefit from a brace that takes some pressure off the knee (unloader brace). Most knee braces are made from a combination of cloth and metal or plastic.  You may need to wear a knee brace to:  Relieve knee pain.  Help your knee support your weight (improve stability).  Help you walk farther (improve mobility).  Prevent injury.  Support your knee while it heals from surgery or from an injury. RISKS AND COMPLICATIONS Generally, knee braces are very safe to wear. However, problems may occur, including:  Skin irritation that may lead to infection.  Making your condition worse if you wear the brace in the wrong way. HOW TO USE A KNEE BRACE Different braces will have different instructions for use. Your health care provider will tell you or show you:  How to put on your brace.  How to adjust the brace.  When and how often to wear the brace.  How to remove the brace.  If you will need any assistive devices in addition to the brace, such as crutches or a cane. In general, your brace should:  Have the hinge of the brace line up with the bend of your knee.  Have straps, hooks, or tapes that fasten snugly around your leg.  Not feel too  tight or too loose. HOW TO CARE FOR A KNEE BRACE  Check your brace often for signs of damage, such as loose connections or attachments. Your knee brace may get damaged or wear out during normal use.  Wash the fabric parts of your brace with soap and water.  Read the insert that comes with your brace for other specific care instructions. SEEK MEDICAL CARE IF:  Your knee brace is too loose or too tight and you cannot adjust it.  Your knee brace causes skin redness, swelling, bruising, or irritation.  Your knee brace is not helping.  Your knee brace is making your knee pain worse.   This information is not intended to replace advice given to you by your health care provider. Make sure you discuss any questions you have with your health care provider.   Document Released: 04/10/2003 Document Revised: 10/09/2014 Document Reviewed: 05/13/2014 Elsevier Interactive Patient Education 2016 [place] [facility] Joint pain, which is also called arthralgia, can be caused by many things. Joint pain often goes away when you follow your health care provider's instructions for relieving pain at home. However, joint pain can also be caused by conditions that require further treatment. Common causes of joint pain include:  Bruising in the area of the joint.  Overuse of the joint.  Wear and tear on the joints that occur with aging (osteoarthritis).  Various other forms of arthritis.  A  buildup of a crystal form of uric acid in the joint (gout).  Infections of the joint (septic arthritis) or of the bone (osteomyelitis). Your health care provider may recommend medicine to help with the pain. If your joint pain continues, additional tests may be needed to diagnose your condition. HOME CARE INSTRUCTIONS Watch your condition for any changes. Follow these instructions as directed to lessen the pain that you are feeling.  Take medicines only as directed by your health care provider.  Rest the  affected area for as long as your health care provider says that you should. If directed to do so, raise the painful joint above the level of your heart while you are sitting or lying down.  Do not do things that cause or worsen pain.  If directed, apply ice to the painful area:  Put ice in a plastic bag.  Place a towel between your skin and the bag.  Leave the ice on for 20 minutes, 2-3 times per day.  Wear an elastic bandage, splint, or sling as directed by your health care provider. Loosen the elastic bandage or splint if your fingers or toes become numb and tingle, or if they turn cold and blue.  Begin exercising or stretching the affected area as directed by your health care provider. Ask your health care provider what types of exercise are safe for you.  Keep all follow-up visits as directed by your health care provider. This is important. SEEK MEDICAL CARE IF:  Your pain increases, and medicine does not help.  Your joint pain does not improve within 3 days.  You have increased bruising or swelling.  You have a fever.  You lose 10 lb (4.5 kg) or more without trying. SEEK IMMEDIATE MEDICAL CARE IF:  You are not able to move the joint.  Your fingers or toes become numb or they turn cold and blue.   This information is not intended to replace advice given to you by your health care provider. Make sure you discuss any questions you have with your health care provider.   Document Released: 01/18/2005 Document Revised: 02/08/2014 Document Reviewed: 10/30/2013 Elsevier Interactive Patient Education [employer].

## 2015-04-11 NOTE — ED Notes (Signed)
Called x 2.   Patient did not answer.

## 2015-04-11 NOTE — ED Notes (Signed)
RN went to retreive patient per Jenna Jacquet PA's request to wait for final [place] report. Patient waited approx 15 min then stated she wanted to leave. Pt. Wheeled out

## 2015-04-11 NOTE — ED Notes (Signed)
Pt had vascular tech to call me in the room,  Pt had tongue sticking out between her teeth. Gave hand gestures that she was having an allergic reaction to the Toradol injection. Reported to Loop. Epi Pen ordered and given.

## 2015-04-11 NOTE — ED Notes (Signed)
Pt c/o pain to left knee and B/L ankle ongoing since fall on the 2nd.

## 2015-04-11 NOTE — ED Notes (Signed)
Pt requested knee brace as apposed to immobilizer. PA informed.

## 2015-04-14 ENCOUNTER — Ambulatory Visit (HOSPITAL_COMMUNITY)
Admission: RE | Admit: 2015-04-14 | Discharge: 2015-04-14 | Disposition: A | Discharge status: Home / Self Care | Payer: Medicaid Other | Attending: Psychiatry | Admitting: Psychiatry

## 2015-04-14 ENCOUNTER — Encounter (HOSPITAL_COMMUNITY): Payer: Self-pay | Admitting: Emergency Medicine

## 2015-04-14 ENCOUNTER — Emergency Department (HOSPITAL_COMMUNITY)
Admission: EM | Admit: 2015-04-14 | Discharge: 2015-04-15 | Disposition: A | Discharge status: Home / Self Care | Payer: Medicaid Other | Attending: Emergency Medicine | Admitting: Emergency Medicine

## 2015-04-14 DIAGNOSIS — R443 Hallucinations, unspecified: Secondary | ICD-10-CM | POA: Insufficient documentation

## 2015-04-14 DIAGNOSIS — Z79899 Other long term (current) drug therapy: Secondary | ICD-10-CM | POA: Insufficient documentation

## 2015-04-14 DIAGNOSIS — F209 Schizophrenia, unspecified: Secondary | ICD-10-CM | POA: Diagnosis not present

## 2015-04-14 DIAGNOSIS — F99 Mental disorder, not otherwise specified: Secondary | ICD-10-CM | POA: Insufficient documentation

## 2015-04-14 DIAGNOSIS — Z008 Encounter for other general examination: Secondary | ICD-10-CM | POA: Diagnosis not present

## 2015-04-14 DIAGNOSIS — Z888 Allergy status to other drugs, medicaments and biological substances status: Secondary | ICD-10-CM | POA: Insufficient documentation

## 2015-04-14 DIAGNOSIS — E669 Obesity, unspecified: Secondary | ICD-10-CM | POA: Insufficient documentation

## 2015-04-14 DIAGNOSIS — I10 Essential (primary) hypertension: Secondary | ICD-10-CM | POA: Insufficient documentation

## 2015-04-14 DIAGNOSIS — F250 Schizoaffective disorder, bipolar type: Secondary | ICD-10-CM | POA: Diagnosis present

## 2015-04-14 DIAGNOSIS — Z91040 Latex allergy status: Secondary | ICD-10-CM | POA: Diagnosis not present

## 2015-04-14 DIAGNOSIS — F251 Schizoaffective disorder, depressive type: Secondary | ICD-10-CM | POA: Diagnosis not present

## 2015-04-14 LAB — RAPID URINE DRUG SCREEN, HOSP PERFORMED
Amphetamines: NOT DETECTED
BARBITURATES: NOT DETECTED
BENZODIAZEPINES: NOT DETECTED
COCAINE: NOT DETECTED
Opiates: NOT DETECTED
TETRAHYDROCANNABINOL: NOT DETECTED

## 2015-04-14 LAB — COMPREHENSIVE METABOLIC PANEL
ALBUMIN: 4.5 g/dL (ref 3.5–5.0)
ALK PHOS: 44 U/L (ref 38–126)
ALT: 19 U/L (ref 14–54)
AST: 21 U/L (ref 15–41)
Anion gap: 9 (ref 5–15)
BILIRUBIN TOTAL: 0.5 mg/dL (ref 0.3–1.2)
BUN: 15 mg/dL (ref 6–20)
CALCIUM: 9.7 mg/dL (ref 8.9–10.3)
CO2: 28 mmol/L (ref 22–32)
CREATININE: 1 mg/dL (ref 0.44–1.00)
Chloride: 104 mmol/L (ref 101–111)
GFR calc non Af Amer: >60 mL/min (ref >60)
GLUCOSE: 113 mg/dL — AB (ref 65–99)
Potassium: 3.4 mmol/L — ABNORMAL LOW (ref 3.5–5.1)
SODIUM: 141 mmol/L (ref 135–145)
TOTAL PROTEIN: 8.4 g/dL — AB (ref 6.5–8.1)

## 2015-04-14 LAB — CBC
HEMATOCRIT: 38.6 % (ref 36.0–46.0)
HEMOGLOBIN: 13.6 g/dL (ref 12.0–15.0)
MCH: 29.6 pg (ref 26.0–34.0)
MCHC: 35.2 g/dL (ref 30.0–36.0)
MCV: 83.9 fL (ref 78.0–100.0)
Platelets: 413 10*3/uL — ABNORMAL HIGH (ref 150–400)
RBC: 4.6 MIL/uL (ref 3.87–5.11)
RDW: 12.8 % (ref 11.5–15.5)
WBC: 7.5 10*3/uL (ref 4.0–10.5)

## 2015-04-14 LAB — ETHANOL: Alcohol, Ethyl (B): <5 mg/dL (ref <5)

## 2015-04-14 LAB — ACETAMINOPHEN LEVEL: Acetaminophen (Tylenol), Serum: <10 ug/mL — ABNORMAL LOW (ref 10–30)

## 2015-04-14 LAB — SALICYLATE LEVEL: Salicylate Lvl: <4 mg/dL (ref 2.8–30.0)

## 2015-04-14 MED ORDER — NICOTINE 21 MG/24HR TD PT24: 21.0000 mg | MEDICATED_PATCH | Freq: Every day | TRANSDERMAL | Status: DC

## 2015-04-14 MED ORDER — LORAZEPAM 1 MG PO TABS: 1.0000 mg | ORAL_TABLET | Freq: Three times a day (TID) | ORAL | Status: DC | PRN

## 2015-04-14 MED ORDER — ALUM & MAG HYDROXIDE-SIMETH 200-200-20 MG/5ML PO SUSP: 30.0000 mL | ORAL | Status: DC | PRN

## 2015-04-14 MED ORDER — ONDANSETRON HCL 4 MG PO TABS: 4.0000 mg | ORAL_TABLET | Freq: Three times a day (TID) | ORAL | Status: DC | PRN

## 2015-04-14 MED ORDER — ZOLPIDEM TARTRATE 5 MG PO TABS
5.0000 mg | ORAL_TABLET | Freq: Every evening | ORAL | Status: DC | PRN
  Administered 2015-04-14: 5 mg via ORAL
  Filled 2015-04-14: qty 1

## 2015-04-14 MED ORDER — ACETAMINOPHEN 325 MG PO TABS: 650.0000 mg | ORAL_TABLET | ORAL | Status: DC | PRN

## 2015-04-14 NOTE — BH Assessment (Addendum)
Assessment Note  Jenna Hill is an 45 y.o. female who presents voluntarily to [facility] as a walk in due to [place]. Pt discloses she has a hx of schizophrenia and bipolar d/o. Pt indicated that she was being followed by the clinic at [facility] in [place] for her psychiatric issues. Pt indicated that she moved back to [place] an undetermined amount of time ago and has not established care anywhere to continue her meds. Pt reported being off of her meds for almost 3 months. Pt indicated that "the voices are becoming more prevalent". Pt additionally disclosed that she was talking to herself more and more and the voices were speaking several different languages. Pt indicated that it has gotten to the point that she "can't take it anymore".  Pt also endorsed VH in the form of "pops of light...energy changing".  Pt denied SI/HI/drug use. Pt appeared to be responding to internal stimuli and was observed, by this Probation officer, to be talking to herself out loud in a different language.   Diagnosis: Schizophrenia  Past Medical History:  Past Medical History  Diagnosis Date  . Hypertension     No past surgical history on file.  Family History: No family history on file.  Social History:  reports that she has never smoked. She has never used smokeless tobacco. She reports that she drinks alcohol. She reports that she does not use illicit drugs.  Additional Social History:  Alcohol / Drug Use Pain Medications: none reported Prescriptions: Metroprolol 50mg ; Norvasc 10mg ; Xanax; Haldol 40mg ; Seroquel 400 mg; Ambien10mg  Over the Counter: none reported History of alcohol / drug use?: No history of alcohol / drug abuse  CIWA:   COWS:    Allergies:  Allergies  Allergen Reactions  . Bee Venom Anaphylaxis  . Codeine Anaphylaxis  . Other Anaphylaxis    bertrillium or bertillium    . Toradol [Ketorolac Tromethamine] Swelling  . Latex Rash    Home Medications:  (Not in a hospital  admission)  OB/GYN Status:  Patient's last menstrual period was 03/27/2015.  General Assessment Data Location of Assessment: [facility] Assessment Services TTS Assessment: In system Is this a Tele or Face-to-Face Assessment?: Face-to-Face Is this an Initial Assessment or a Re-assessment for this encounter?: Initial Assessment Marital status: Divorced Is patient pregnant?: No Pregnancy Status: No Living Arrangements: Alone Can pt return to current living arrangement?: Yes Admission Status: Voluntary Is patient capable of signing voluntary admission?: Yes Referral Source: Self/Family/Friend Insurance type: none  Medical Screening Exam ([place]) Medical Exam completed: Yes  Crisis Care Plan Living Arrangements: Alone Name of Psychiatrist: none Name of Therapist: none  Education Status Is patient currently in school?: No  Risk to self with the past 6 months Suicidal Ideation: No Has patient been a risk to self within the past 6 months prior to admission? : No Suicidal Intent: No Has patient had any suicidal intent within the past 6 months prior to admission? : No Is patient at risk for suicide?: No Suicidal Plan?: No Has patient had any suicidal plan within the past 6 months prior to admission? : No Access to Means: No What has been your use of drugs/alcohol within the last 12 months?: pt denies Previous Attempts/Gestures: Yes How many times?: 1 Other Self Harm Risks: 0 Intentional Self Injurious Behavior: None Family Suicide History: No Recent stressful life event(s):  (hallucinations) Persecutory voices/beliefs?: No Depression: No Depression Symptoms: Insomnia Substance abuse history and/or treatment for substance abuse?: No Suicide prevention information given  to non-admitted patients: Not applicable  Risk to Others within the past 6 months Homicidal Ideation: No Does patient have any lifetime risk of violence toward others beyond the six months prior to  admission? : No Thoughts of Harm to Others: No Current Homicidal Intent: No Current Homicidal Plan: No Access to Homicidal Means: No History of harm to others?: No Assessment of Violence: None Noted Violent Behavior Description: none noted Does patient have access to weapons?: No Criminal Charges Pending?: No Does patient have a court date: No Is patient on probation?: No  Psychosis Hallucinations: Auditory, Visual Delusions: None noted  Mental Status Report Appearance/Hygiene: Unremarkable Eye Contact: Good Motor Activity: Unremarkable Speech: Logical/coherent Level of Consciousness: Alert Mood: Anxious, Preoccupied Affect: Appropriate to circumstance Anxiety Level: Moderate Thought Processes: Coherent, Relevant Judgement: Partial Orientation: Place, Person, Time, Situation, Appropriate for developmental age Obsessive Compulsive Thoughts/Behaviors: None  Cognitive Functioning Concentration: Decreased Memory: Recent Intact, Remote Intact IQ: Average Insight: Fair Impulse Control: Fair Appetite: Good Sleep: Decreased Total Hours of Sleep: 1 Vegetative Symptoms: None  ADLScreening [facility] Assessment Services) Patient's cognitive ability adequate to safely complete daily activities?: Yes Patient able to express need for assistance with ADLs?: Yes Independently performs ADLs?: Yes (appropriate for developmental age)  Prior Inpatient Therapy Prior Inpatient Therapy: Yes Prior Therapy Dates: multiple times over the years Prior Therapy Facilty/Provider(s): [facility]; [facility] Reason for Treatment: schizophrenia; bipolar  Prior Outpatient Therapy Prior Outpatient Therapy: Yes Prior Therapy Dates: 2016 Prior Therapy Facilty/Provider(s): [facility] Reason for Treatment: schizophrenia; bipolar Does patient have an ACCT team?: No Does patient have Intensive In-House Services?  : No Does patient have [facility] services? : No Does patient have P4CC services?: No  ADL Screening (condition at  time of admission) Patient's cognitive ability adequate to safely complete daily activities?: Yes Is the patient deaf or have difficulty hearing?: No Does the patient have difficulty seeing, even when wearing glasses/contacts?: No Does the patient have difficulty concentrating, remembering, or making decisions?: No Patient able to express need for assistance with ADLs?: Yes Does the patient have difficulty dressing or bathing?: No Independently performs ADLs?: Yes (appropriate for developmental age) Does the patient have difficulty walking or climbing stairs?: No Weakness of Legs: None Weakness of Arms/Hands: None  Home Assistive Devices/Equipment Home Assistive Devices/Equipment: None  Therapy Consults (therapy consults require a physician order) PT Evaluation Needed: No OT Evalulation Needed: No SLP Evaluation Needed: No Abuse/Neglect Assessment (Assessment to be complete while patient is alone) Physical Abuse: Denies Verbal Abuse: Denies Sexual Abuse: Denies Exploitation of patient/patient's resources: Denies Self-Neglect: Denies Values / Beliefs Cultural Requests During Hospitalization: None Spiritual Requests During Hospitalization: None Consults Spiritual Care Consult Needed: No Social Work Consult Needed: No Regulatory affairs officer (For Healthcare) Does patient have an advance directive?: No Would patient like information on creating an advanced directive?: No - patient declined information    Additional Information 1:1 In Past 12 Months?: No CIRT Risk: No Elopement Risk: No Does patient have medical clearance?: No     Disposition:  Disposition Initial Assessment Completed for this Encounter: Yes Disposition of Patient: Inpatient treatment program (consulted with Dr. Sabra Heck) Type of inpatient treatment program: Adult (TTS to seek placement)  On Site Evaluation by:   Reviewed with Physician:    Rexene Edison 04/14/2015 6:15 PM

## 2015-04-14 NOTE — ED Provider Notes (Addendum)
CSN: [id number]     Arrival date & time 04/14/15  1746 History   First MD Initiated Contact with Patient 04/14/15 1845     Chief Complaint  Patient presents with  . Medical Clearance  . Hallucinations     (Consider location/radiation/quality/duration/timing/severity/associated sxs/prior Treatment) HPI .Marland KitchenMarland KitchenMarland KitchenLevel V caveat for psychiatric illness. Patient claims to be able to speak multiple languages. However she has no formal training in linguistics.  Patient states that she has recently been admitted to [facility] in [place] and [facility] for psychiatric problems.She states a history of schizophrenia and bipolar disorder, but has been on no meds for 3 months.   No obvious suicidal or homicidal ideation. Past Medical History  Diagnosis Date  . Hypertension    History reviewed. No pertinent past surgical history. No family history on file. Social History  Substance Use Topics  . Smoking status: Never Smoker   . Smokeless tobacco: Never Used  . Alcohol Use: Yes   OB History    No data available     Review of Systems  Reason unable to perform ROS: Psychiatric illness.      Allergies  Bee venom; Codeine; Other; Toradol; and Latex  Home Medications   Prior to Admission medications   Medication Sig Start Date End Date Taking? Authorizing Provider  amLODipine (NORVASC) 10 MG tablet Take 10 mg by mouth daily.   Yes Historical Provider, MD  diphenhydrAMINE (BENADRYL) 25 mg capsule Take 1 capsule (25 mg total) by mouth every 4 (four) hours as needed for itching. 10/07/12  Yes Elsie Stain, MD  EPINEPHrine (EPI-PEN) 0.3 mg/0.3 mL SOAJ injection Inject 0.3 mg into the muscle daily as needed (allergic reaction).    Yes Historical Provider, MD  haloperidol (HALDOL) 20 MG tablet Take 40 mg by mouth daily.   Yes Historical Provider, MD  ibuprofen (ADVIL,MOTRIN) 200 MG tablet Take 600 mg by mouth every 6 (six) hours as needed for moderate pain.   Yes Historical Provider, MD   metoprolol (LOPRESSOR) 50 MG tablet Take 50 mg by mouth daily.   Yes Historical Provider, MD  QUEtiapine (SEROQUEL) 400 MG tablet Take 400 mg by mouth at bedtime.   Yes Historical Provider, MD  traZODone (DESYREL) 50 MG tablet Take 50 mg by mouth at bedtime as needed for sleep.   Yes Historical Provider, MD  zolpidem (AMBIEN) 10 MG tablet Take 10 mg by mouth at bedtime as needed for sleep.   Yes Historical Provider, MD   BP 146/92 mmHg  Pulse 84  Temp(Src) 97.7 F (36.5 C) (Oral)  Resp 15  SpO2 100%  LMP 03/27/2015 Physical Exam  Constitutional: She is oriented to person, place, and time. She appears well-developed and well-nourished.  Obese, pleasant  HENT:  Head: Normocephalic and atraumatic.  Eyes: Conjunctivae and EOM are normal. Pupils are equal, round, and reactive to light.  Neck: Normal range of motion. Neck supple.  Cardiovascular: Normal rate and regular rhythm.   Pulmonary/Chest: Effort normal and breath sounds normal.  Abdominal: Soft. Bowel sounds are normal.  Musculoskeletal: Normal range of motion.  Neurological: She is alert and oriented to person, place, and time.  Skin: Skin is warm and dry.  Psychiatric:  Patient was alternating between normal speech and destroyed flight of ideas  Nursing note and vitals reviewed.   ED Course  Procedures (including critical care time) Labs Review Labs Reviewed  COMPREHENSIVE METABOLIC PANEL - Abnormal; Notable for the following:    Potassium 3.4 (*)  Glucose, Bld 113 (*)    Total Protein 8.4 (*)    All other components within normal limits  ACETAMINOPHEN LEVEL - Abnormal; Notable for the following:    Acetaminophen (Tylenol), Serum <10 (*)    All other components within normal limits  CBC - Abnormal; Notable for the following:    Platelets 413 (*)    All other components within normal limits  ETHANOL  SALICYLATE LEVEL  URINE RAPID DRUG SCREEN, HOSP PERFORMED    Imaging Review No results found. I have  personally reviewed and evaluated these images and lab results as part of my medical decision-making.   EKG Interpretation None      MDM   Final diagnoses:  Hallucinations    We'll obtain behavioral health consult.    Nat Christen, MD 04/14/15 [place], MD 04/14/15 [phone]

## 2015-04-14 NOTE — ED Notes (Signed)
Patient presents for medical clearance and AH. Patient states she doesn't understand their language. Denies SI/HI/VH.

## 2015-04-15 ENCOUNTER — Inpatient Hospital Stay
Admission: EM | Admit: 2015-04-15 | Discharge: 2015-04-24 | DRG: 885 | Disposition: A | Discharge status: Home / Self Care | Payer: Medicaid Other | Source: Intra-hospital | Attending: Psychiatry | Admitting: Psychiatry

## 2015-04-15 DIAGNOSIS — E669 Obesity, unspecified: Secondary | ICD-10-CM | POA: Diagnosis present

## 2015-04-15 DIAGNOSIS — G4733 Obstructive sleep apnea (adult) (pediatric): Secondary | ICD-10-CM

## 2015-04-15 DIAGNOSIS — I10 Essential (primary) hypertension: Secondary | ICD-10-CM | POA: Diagnosis present

## 2015-04-15 DIAGNOSIS — Z888 Allergy status to other drugs, medicaments and biological substances status: Secondary | ICD-10-CM

## 2015-04-15 DIAGNOSIS — G4700 Insomnia, unspecified: Secondary | ICD-10-CM | POA: Diagnosis present

## 2015-04-15 DIAGNOSIS — Z91040 Latex allergy status: Secondary | ICD-10-CM

## 2015-04-15 DIAGNOSIS — F250 Schizoaffective disorder, bipolar type: Secondary | ICD-10-CM | POA: Diagnosis present

## 2015-04-15 DIAGNOSIS — F251 Schizoaffective disorder, depressive type: Principal | ICD-10-CM | POA: Diagnosis present

## 2015-04-15 DIAGNOSIS — E221 Hyperprolactinemia: Secondary | ICD-10-CM | POA: Diagnosis present

## 2015-04-15 DIAGNOSIS — M25569 Pain in unspecified knee: Secondary | ICD-10-CM | POA: Diagnosis present

## 2015-04-15 DIAGNOSIS — R443 Hallucinations, unspecified: Secondary | ICD-10-CM | POA: Diagnosis not present

## 2015-04-15 DIAGNOSIS — Z886 Allergy status to analgesic agent status: Secondary | ICD-10-CM

## 2015-04-15 DIAGNOSIS — D352 Benign neoplasm of pituitary gland: Secondary | ICD-10-CM

## 2015-04-15 DIAGNOSIS — Z6841 Body Mass Index (BMI) 40.0 and over, adult: Secondary | ICD-10-CM

## 2015-04-15 DIAGNOSIS — Z9114 Patient's other noncompliance with medication regimen: Secondary | ICD-10-CM

## 2015-04-15 DIAGNOSIS — Z5181 Encounter for therapeutic drug level monitoring: Secondary | ICD-10-CM | POA: Diagnosis not present

## 2015-04-15 MED ORDER — BENZTROPINE MESYLATE 1 MG PO TABS
1.0000 mg | ORAL_TABLET | Freq: Two times a day (BID) | ORAL | Status: DC
  Administered 2015-04-15: 1 mg via ORAL
  Filled 2015-04-15: qty 1

## 2015-04-15 MED ORDER — HALOPERIDOL 5 MG PO TABS: 40.0000 mg | ORAL_TABLET | Freq: Every day | ORAL | Status: DC

## 2015-04-15 MED ORDER — TRAZODONE HCL 50 MG PO TABS: 150.0000 mg | ORAL_TABLET | Freq: Every day | ORAL | Status: DC

## 2015-04-15 MED ORDER — TRAZODONE HCL 50 MG PO TABS: 50.0000 mg | ORAL_TABLET | Freq: Every evening | ORAL | Status: DC | PRN

## 2015-04-15 MED ORDER — TRAZODONE HCL 100 MG PO TABS
100.0000 mg | ORAL_TABLET | Freq: Once | ORAL | Status: AC
  Administered 2015-04-16: 100 mg via ORAL
  Filled 2015-04-15: qty 1

## 2015-04-15 MED ORDER — HYDROXYZINE HCL 25 MG PO TABS
25.0000 mg | ORAL_TABLET | Freq: Three times a day (TID) | ORAL | Status: DC | PRN
  Administered 2015-04-15 – 2015-04-16 (×2): 25 mg via ORAL
  Filled 2015-04-15 (×2): qty 1

## 2015-04-15 MED ORDER — QUETIAPINE FUMARATE 200 MG PO TABS
200.0000 mg | ORAL_TABLET | Freq: Once | ORAL | Status: AC
  Administered 2015-04-16: 200 mg via ORAL
  Filled 2015-04-15: qty 1

## 2015-04-15 MED ORDER — MAGNESIUM HYDROXIDE 400 MG/5ML PO SUSP: 30.0000 mL | Freq: Every day | ORAL | Status: DC | PRN

## 2015-04-15 MED ORDER — IBUPROFEN 600 MG PO TABS
600.0000 mg | ORAL_TABLET | Freq: Four times a day (QID) | ORAL | Status: DC | PRN
  Administered 2015-04-16 – 2015-04-24 (×10): 600 mg via ORAL
  Filled 2015-04-15 (×10): qty 1

## 2015-04-15 MED ORDER — AMLODIPINE BESYLATE 10 MG PO TABS
10.0000 mg | ORAL_TABLET | Freq: Every day | ORAL | Status: DC
  Administered 2015-04-15: 10 mg via ORAL
  Filled 2015-04-15: qty 1

## 2015-04-15 MED ORDER — EPINEPHRINE 0.3 MG/0.3ML IJ SOAJ: 0.3000 mg | Freq: Every day | INTRAMUSCULAR | Status: DC | PRN

## 2015-04-15 MED ORDER — QUETIAPINE FUMARATE 300 MG PO TABS: 400.0000 mg | ORAL_TABLET | Freq: Every day | ORAL | Status: DC

## 2015-04-15 MED ORDER — ALUM & MAG HYDROXIDE-SIMETH 200-200-20 MG/5ML PO SUSP
30.0000 mL | ORAL | Status: DC | PRN
  Administered 2015-04-19 – 2015-04-23 (×6): 30 mL via ORAL
  Filled 2015-04-15 (×6): qty 30

## 2015-04-15 MED ORDER — METOPROLOL TARTRATE 25 MG PO TABS
50.0000 mg | ORAL_TABLET | Freq: Every day | ORAL | Status: DC
  Administered 2015-04-15: 50 mg via ORAL
  Filled 2015-04-15: qty 2

## 2015-04-15 MED ORDER — METOPROLOL TARTRATE 25 MG PO TABS
50.0000 mg | ORAL_TABLET | Freq: Every day | ORAL | Status: DC
  Administered 2015-04-15 – 2015-04-17 (×3): 50 mg via ORAL
  Filled 2015-04-15 (×3): qty 2

## 2015-04-15 MED ORDER — HALOPERIDOL 5 MG PO TABS
10.0000 mg | ORAL_TABLET | Freq: Two times a day (BID) | ORAL | Status: DC
  Administered 2015-04-15: 10 mg via ORAL
  Filled 2015-04-15: qty 2

## 2015-04-15 MED ORDER — AMLODIPINE BESYLATE 10 MG PO TABS
10.0000 mg | ORAL_TABLET | Freq: Every day | ORAL | Status: DC
  Administered 2015-04-15 – 2015-04-24 (×10): 10 mg via ORAL
  Filled 2015-04-15 (×10): qty 1

## 2015-04-15 MED ORDER — ACETAMINOPHEN 325 MG PO TABS: 650.0000 mg | ORAL_TABLET | Freq: Four times a day (QID) | ORAL | Status: DC | PRN

## 2015-04-15 NOTE — Progress Notes (Signed)
Cm referred to pt by [facility] RN CM spoke with pt who confirms uninsured [facility]enna Hill resident with no pcp.  CM discussed and provided written information for uninsured accepting pcps, discussed Jenna importance of pcp vs EDP services for f/u [place], www.needymeds.org, www.goodrx.com, discounted Hill and other [employer] such as Jenna Hill , [facility]enna [facility], affordable [place] act, [place] assistance, uninsured dental services, [place] med assist, DSS and  health department  Reviewed resources for Jenna Hill uninsured accepting pcps like [facility]enna [facility], family medicine at Jenna Hill, Jenna [facility], Jenna [facility], local urgent Hill centers, [facility], [facility]enna [facility], general medical clinics, family services of [facility] [place], [facility] urgent Hill plus others, medication resources, Jenna Hill and housing Pt voiced understanding and appreciation of resources provided   Provided [facility] contact information Pt agreed to a referral Cm completed referral Pt to be contact by [facility] clinical liason Pt states she lives in [name] but had been commuting to [place] for work and wants to stop commuting

## 2015-04-15 NOTE — Consult Note (Signed)
[facility] Psychiatry Consult   Reason for Consult:  Auditory and Visual hallucination. Referring Physician:  EDP Patient Identification: Jenna Hill MRN:  [id number] Principal Diagnosis: Schizoaffective disorder, depressive type ([place]) Diagnosis:   Patient Active Problem List   Diagnosis Date Noted  . Schizoaffective disorder, depressive type ([place]) [F25.1] 04/15/2015    Priority: High  . Acute respiratory failure ([place]) [J96.00] 10/05/2012  . Anaphylaxis [T78.2XXA] 10/05/2012  . 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 Hypertension [I10] 10/05/2012    Total Time spent with patient: 45 minutes  Subjective:   Jenna Hill is a 45 y.o. female patient admitted with  Auditory/visual halucination  HPI:  AA female, 45 years old was evaluated for auditory/visual hallucination.  Patient admits to a long standing Psychiatric hx for Schizophrenia and Bipolar disorder.  Patient has had several hospitalizations including a recent one 3 weeks ago at [facility] in [place].  Patient states she moves home between [place] and [place] but have decided to stay close to her family.  Patient reports that she is hearing voices speaking in different language she cannot understand.  Patient also sees flashes of light. She denies SI/HI but want to get back on her medications.  Patient has been accepted for admission and we will be seeking placement at any facility with available bed.   Past Psychiatric History:  Schizophrenia Undifferenciated, Bipolar disorder  Risk to Self: Is patient at risk for suicide?: No Risk to Others:   Prior Inpatient Therapy:   Prior Outpatient Therapy:    Past Medical History:  Past Medical History  Diagnosis Date  . Hypertension    History reviewed. No pertinent past surgical history. Family History: No family history on file.   Family Psychiatric  History: Denies Social History:  History  Alcohol Use  . Yes     History  Drug Use No     Social History   Social History  . Marital Status: Married    Spouse Name: N/A  . Number of Children: N/A  . Years of Education: N/A   Social History Main Topics  . Smoking status: Never Smoker   . Smokeless tobacco: Never Used  . Alcohol Use: Yes  . Drug Use: No  . Sexual Activity: Not Currently   Other Topics Concern  . None   Social History Narrative   Additional Social History:    Allergies:   Allergies  Allergen Reactions  . Bee Venom Anaphylaxis  . Codeine Anaphylaxis  . Other Anaphylaxis    bertrillium or bertillium    . Toradol [Ketorolac Tromethamine] Swelling  . Latex Rash    Labs:  Results for orders placed or performed during the hospital encounter of 04/14/15 (from the past 48 hour(s))  Urine rapid drug screen (hosp performed) (Not at [facility])     Status: None   Collection Time: 04/14/15  6:36 PM  Result Value Ref Range   Opiates NONE DETECTED NONE DETECTED   Cocaine NONE DETECTED NONE DETECTED   Benzodiazepines NONE DETECTED NONE DETECTED   Amphetamines NONE DETECTED NONE DETECTED   Tetrahydrocannabinol NONE DETECTED NONE DETECTED   Barbiturates NONE DETECTED NONE DETECTED    Comment:        DRUG SCREEN FOR MEDICAL PURPOSES ONLY.  IF CONFIRMATION IS NEEDED FOR ANY PURPOSE, NOTIFY LAB WITHIN 5 DAYS.        LOWEST DETECTABLE LIMITS FOR URINE DRUG SCREEN Drug Class       Cutoff (ng/mL) Amphetamine      1000 Barbiturate  200 Benzodiazepine   106 Tricyclics       269 Opiates          300 Cocaine          300 THC              50   Comprehensive metabolic panel     Status: Abnormal   Collection Time: 04/14/15  7:33 PM  Result Value Ref Range   Sodium 141 135 - 145 mmol/L   Potassium 3.4 (L) 3.5 - 5.1 mmol/L   Chloride 104 101 - 111 mmol/L   CO2 28 22 - 32 mmol/L   Glucose, Bld 113 (H) 65 - 99 mg/dL   BUN 15 6 - 20 mg/dL   Creatinine, Ser 1.00 0.44 - 1.00 mg/dL   Calcium 9.7 8.9 - 10.3 mg/dL   Total Protein 8.4 (H) 6.5 - 8.1 g/dL    Albumin 4.5 3.5 - 5.0 g/dL   AST 21 15 - 41 U/L   ALT 19 14 - 54 U/L   Alkaline Phosphatase 44 38 - 126 U/L   Total Bilirubin 0.5 0.3 - 1.2 mg/dL   GFR calc non Af Amer >60 >60 mL/min   GFR calc Af Amer >60 >60 mL/min    Comment: (NOTE) The eGFR has been calculated using the CKD EPI equation. This calculation has not been validated in all clinical situations. eGFR's persistently <60 mL/min signify possible Chronic Kidney Disease.    Anion gap 9 5 - 15  Ethanol (ETOH)     Status: None   Collection Time: 04/14/15  7:33 PM  Result Value Ref Range   Alcohol, Ethyl (B) <5 <5 mg/dL    Comment:        LOWEST DETECTABLE LIMIT FOR SERUM ALCOHOL IS 5 mg/dL FOR MEDICAL PURPOSES ONLY   Salicylate level     Status: None   Collection Time: 04/14/15  7:33 PM  Result Value Ref Range   Salicylate Lvl <4.8 2.8 - 30.0 mg/dL  Acetaminophen level     Status: Abnormal   Collection Time: 04/14/15  7:33 PM  Result Value Ref Range   Acetaminophen (Tylenol), Serum <10 (L) 10 - 30 ug/mL    Comment:        THERAPEUTIC CONCENTRATIONS VARY SIGNIFICANTLY. A RANGE OF 10-30 ug/mL MAY BE AN EFFECTIVE CONCENTRATION FOR MANY PATIENTS. HOWEVER, SOME ARE BEST TREATED AT CONCENTRATIONS OUTSIDE THIS RANGE. ACETAMINOPHEN CONCENTRATIONS >150 ug/mL AT 4 HOURS AFTER INGESTION AND >50 ug/mL AT 12 HOURS AFTER INGESTION ARE OFTEN ASSOCIATED WITH TOXIC REACTIONS.   CBC     Status: Abnormal   Collection Time: 04/14/15  7:33 PM  Result Value Ref Range   WBC 7.5 4.0 - 10.5 K/uL   RBC 4.60 3.87 - 5.11 MIL/uL   Hemoglobin 13.6 12.0 - 15.0 g/dL   HCT 38.6 36.0 - 46.0 %   MCV 83.9 78.0 - 100.0 fL   MCH 29.6 26.0 - 34.0 pg   MCHC 35.2 30.0 - 36.0 g/dL   RDW 12.8 11.5 - 15.5 %   Platelets 413 (H) 150 - 400 K/uL    Current Facility-Administered Medications  Medication Dose Route Frequency Provider Last Rate Last Dose  . acetaminophen (TYLENOL) tablet 650 mg  650 mg Oral Q4H PRN Nat Christen, MD      . alum & mag  hydroxide-simeth (MAALOX/MYLANTA) 200-200-20 MG/5ML suspension 30 mL  30 mL Oral PRN Nat Christen, MD      . amLODipine (NORVASC) tablet 10 mg  10 mg Oral Daily Corena Pilgrim, MD   10 mg at 04/15/15 1122  . benztropine (COGENTIN) tablet 1 mg  1 mg Oral BID Corena Pilgrim, MD   1 mg at 04/15/15 1122  . EPINEPHrine (EPI-PEN) injection 0.3 mg  0.3 mg Intramuscular Daily PRN Corena Pilgrim, MD      . haloperidol (HALDOL) tablet 10 mg  10 mg Oral BID Corena Pilgrim, MD   10 mg at 04/15/15 1122  . LORazepam (ATIVAN) tablet 1 mg  1 mg Oral Q8H PRN Nat Christen, MD      . metoprolol tartrate (LOPRESSOR) tablet 50 mg  50 mg Oral Daily Lux Meaders, MD   50 mg at 04/15/15 1122  . ondansetron (ZOFRAN) tablet 4 mg  4 mg Oral Q8H PRN Nat Christen, MD      . QUEtiapine (SEROQUEL) tablet 400 mg  400 mg Oral QHS Rockie Vawter, MD      . traZODone (DESYREL) tablet 150 mg  150 mg Oral QHS Corena Pilgrim, MD       Current Outpatient Prescriptions  Medication Sig Dispense Refill  . amLODipine (NORVASC) 10 MG tablet Take 10 mg by mouth daily.    . diphenhydrAMINE (BENADRYL) 25 mg capsule Take 1 capsule (25 mg total) by mouth every 4 (four) hours as needed for itching. 30 capsule 1  . EPINEPHrine (EPI-PEN) 0.3 mg/0.3 mL SOAJ injection Inject 0.3 mg into the muscle daily as needed (allergic reaction).     . haloperidol (HALDOL) 20 MG tablet Take 40 mg by mouth daily.    Marland Kitchen ibuprofen (ADVIL,MOTRIN) 200 MG tablet Take 600 mg by mouth every 6 (six) hours as needed for moderate pain.    . metoprolol (LOPRESSOR) 50 MG tablet Take 50 mg by mouth daily.    . QUEtiapine (SEROQUEL) 400 MG tablet Take 400 mg by mouth at bedtime.    . traZODone (DESYREL) 50 MG tablet Take 50 mg by mouth at bedtime as needed for sleep.    Marland Kitchen zolpidem (AMBIEN) 10 MG tablet Take 10 mg by mouth at bedtime as needed for sleep.      Musculoskeletal: Strength & Muscle Tone: within normal limits Gait & Station: normal Patient leans:  N/A  Psychiatric Specialty Exam: Review of Systems  Constitutional: Negative.   HENT: Negative.   Eyes: Negative.   Respiratory: Negative.   Cardiovascular: Negative.   Gastrointestinal: Negative.   Genitourinary: Negative.   Musculoskeletal: Negative.   Skin: Negative.   Neurological: Negative.   Endo/Heme/Allergies: Negative.     Blood pressure 130/83, pulse 103, temperature 97.6 F (36.4 C), temperature source Oral, resp. rate 16, last menstrual period 03/27/2015, SpO2 98 %.There is no weight on file to calculate BMI.  General Appearance: Casual and Fairly Groomed  Engineer, water::  Good  Speech:  Clear and Coherent and Normal Rate  Volume:  Normal  Mood:  Anxious  Affect:  Congruent  Thought Process:  Coherent, Goal Directed and Intact  Orientation:  Full (Time, Place, and Person)  Thought Content:  Hallucinations: Auditory Visual  Suicidal Thoughts:  No  Homicidal Thoughts:  No  Memory:  Immediate;   Good Recent;   Good Remote;   Good  Judgement:  Good  Insight:  Good  Psychomotor Activity:  Normal  Concentration:  Good  Recall:  NA  Fund of Knowledge:Good  Language: Good  Akathisia:  No  Handed:  Right  AIMS (if indicated):     Assets:  Desire for Improvement  ADL's:  Intact  Cognition: WNL  Sleep:      Treatment Plan Summary: Daily contact with patient to assess and evaluate symptoms and progress in treatment and Medication management  Disposition:  Accepted for admission and we will be seeking placement at any facility with available bed.   We have resumed all of her medications.   Delfin Gant, NP    PMHNP-BC 04/15/2015 12:29 PM Patient seen face-to-face for psychiatric evaluation, chart reviewed and case discussed with the physician extender and developed treatment plan. Reviewed the information documented and agree with the treatment plan. Corena Pilgrim, MD

## 2015-04-15 NOTE — ED Notes (Signed)
Patient has been in her room most of the day.  She is very quiet and is obviously responding to internal stimuli.  She can frequently be seen speaking very softly in a language that I cannot understand.  She states that she hears voices and cannot understand what they say because they speak to her in a foreign language.  She has been cooperative and will be transferred to [facility] later in the shift.

## 2015-04-15 NOTE — ED Notes (Signed)
Pt is alert and oriented x4. Pt endorses auditory hallucinations, severe anxiety and fear; states "These voice are speaking to me I different languages, I can't understand what they are saying and this is giving me serious anxiety and fear. Pt complained about moderate depression and mild L. knee pain. Pt however denies SI and HI. Will continue to monitor for safety via security cameras and Q 15 minute checks

## 2015-04-15 NOTE — Progress Notes (Signed)
Entered in d/c instructions  Please use the resources provided to you in emergency room by case manager to assist with doctor for follow up A referral for you has been sent to [facility] if you have not received a call in 3 days you may contact them Call Karen Andrianos at [phone] Tuesday-Friday [email or web address] These [place] uninsured resources provide possible primary care providers, resources for discounted medications, housing, dental resources, affordable care act information, plus other resources for [place]   

## 2015-04-15 NOTE — Progress Notes (Signed)
Patient with flat affect, cooperative behavior with skin check and vital signs. Dinner tray ordered and MD notified for orders. Briefly oriented to unit and dayroom at this time. Denies SI/HI at this time. Safety maintained.

## 2015-04-15 NOTE — ED Notes (Signed)
Patient transferred to [facility].  Left the unit ambulatory with [employer].  All belongings given to the driver.

## 2015-04-15 NOTE — BH Assessment (Signed)
[facility] Assessment Progress Note  Per Corena Pilgrim, MD, this pt requires psychiatric hospitalization at this time. Letitia Libra, RN, [facility] calls to report that pt has been accepted to [facility], Rm 309, by Dr Jerilee Hoh. Charmaine Downs, NP, concurs with this decision. Pt has signed Voluntary Admission and Consent for Treatment, as well as Consent to Release Information to [facility], her last outpatient provider, and to her mother, and signed forms have been faxed to [facility]. Pt's nurse, Gerrit Friends, has been notified, and agrees to send original paperwork along with pt via B[place]y Pries, and to call report to [phone].    Jalene Mullet, [place]  Triage Specialist  [phone]

## 2015-04-16 DIAGNOSIS — G4733 Obstructive sleep apnea (adult) (pediatric): Secondary | ICD-10-CM

## 2015-04-16 DIAGNOSIS — E669 Obesity, unspecified: Secondary | ICD-10-CM

## 2015-04-16 DIAGNOSIS — F251 Schizoaffective disorder, depressive type: Principal | ICD-10-CM

## 2015-04-16 MED ORDER — QUETIAPINE FUMARATE 100 MG PO TABS
100.0000 mg | ORAL_TABLET | Freq: Every day | ORAL | Status: DC
Start: 2015-04-16 — End: 2015-04-17
  Administered 2015-04-16: 100 mg via ORAL
  Filled 2015-04-16: qty 1

## 2015-04-16 MED ORDER — TRAZODONE HCL 50 MG PO TABS
ORAL_TABLET | ORAL | Status: AC
  Administered 2015-04-16: 100 mg
  Filled 2015-04-16: qty 2

## 2015-04-16 MED ORDER — PALIPERIDONE ER 3 MG PO TB24
9.0000 mg | ORAL_TABLET | Freq: Every day | ORAL | Status: DC
Start: 2015-04-16 — End: 2015-04-18
  Administered 2015-04-16 – 2015-04-17 (×2): 9 mg via ORAL
  Filled 2015-04-16 (×2): qty 3

## 2015-04-16 MED ORDER — TRAZODONE HCL 100 MG PO TABS
100.0000 mg | ORAL_TABLET | Freq: Every day | ORAL | Status: DC
Start: 2015-04-16 — End: 2015-04-21
  Administered 2015-04-16 – 2015-04-20 (×5): 100 mg via ORAL
  Filled 2015-04-16 (×5): qty 1

## 2015-04-16 NOTE — Progress Notes (Signed)
Admission Note:  45 yr female who presents voluntary commitment for AH, Depression and SI.  Pt is in no acute distress. Pt appears flat and depressed. Pt was calm and cooperative with admission process. Pt denies SI and contracts for safety. Pt denies AH/SI/HI  presently. Pt has Past medical Hx of  Depression, schizoaffective disorder and HTN. POC and unit policies explained and understanding verbalized. Consents obtained. Food and fluids offered, and fluids accepted. Pt had no additional questions or concerns. Patient was receptive to care on the unit, patient complaint with medication, interacting with peers and staff appropriately. 15 minutes checks maintained will continue to monitor.

## 2015-04-16 NOTE — BHH Group Notes (Signed)
[facility] Group Notes:  (Nursing/MHT/Case Management/Adjunct)  Date:  04/16/2015  Time:  10:29 PM  Type of Therapy:  Group Therapy  Participation Level:  Did Not Attend    Summary of Progress/Problems:  Jenna [facility] 04/16/2015, 10:29 PM

## 2015-04-16 NOTE — Plan of Care (Signed)
Problem: Ineffective individual coping Goal: STG: Patient will remain free from self harm Outcome: Progressing Jalise has not harmed herself.

## 2015-04-16 NOTE — BHH Group Notes (Signed)
[facility] LCSW Group Therapy   04/16/2015 1pm  Type of Therapy: Group Therapy   Participation Level: Did Not Attend. Patient invited to participate but declined.    [name] F. Bow Buntyn, MSW, LCSWA, LCAS   

## 2015-04-16 NOTE — Progress Notes (Signed)
Recreation Therapy Notes  INPATIENT RECREATION THERAPY ASSESSMENT  Patient Details Name: Jenna Hill MRN: [id number] DOB: March 11, 1970 Today's Date: 05-02-15  Patient Stressors: Death (Brother died last 2022-05-24)  Coping Skills:   Isolate, Avoidance, Exercise, Art/Dance, Talking, Music, Sports, Other (Comment) (Laugh, smile)  Personal Challenges: Trusting Others  Leisure Interests (2+):  Individual - Other (Comment) (Ride motorcycle, roller skate)  Awareness of Community Resources:  Yes  Community Resources:  Other (Comment) (Roller Rink, Drag strip)  Current Use: Yes  If no, Barriers?:    Patient Strengths:  "Everything about me"  Patient Identified Areas of Improvement:  Overall everything in general  Current Recreation Participation:  Play cards  Patient Goal for Hospitalization:  To establish some things in [place] and return to [place] of Residence:  [place] of Residence:  [place]   Current [place] (including self-harm):  No  Current HI:  No  Consent to Intern Participation: N/A  At this time, patient does not require one-to-one treatment with LRT. If patient's status changes, LRT will develop a Recreational Therapy Care Plan.  Leonette Monarch, LRT/CTRS 05-02-15, 5:02 PM

## 2015-04-16 NOTE — Progress Notes (Signed)
Recreation Therapy Notes  Date: 03.15.17 Time: 3:00 pm Location: Craft Room  Group Topic: Self-esteem  Goal Area(s) Addresses:  Patient will write at least one positive trait. Patient will verbalize benefit of having a healthy self-esteem.  Behavioral Response: Did not attend  Intervention: I Am  Activity: Patients were given a worksheet with the letter I on it and instructed to write as many positive traits inside the letter.  Education: LRT educated patients on ways they can increase their self-esteem.  Education Outcome: Patient did not attend group.   Clinical Observations/Feedback: Patient did not attend group.  Leonette Monarch, LRT/CTRS 04/16/2015 4:32 PM

## 2015-04-16 NOTE — BHH Group Notes (Signed)
[facility] Group Notes:  (Nursing/MHT/Case Management/Adjunct)  Date:  04/16/2015  Time:  3:11 PM  Type of Therapy:  Psychoeducational Skills  Participation Level:  Did Not Attend   Adela Lank [facility] 04/16/2015, 3:11 PM

## 2015-04-16 NOTE — Progress Notes (Signed)
Jenna Hill, Jenna Hill THIS IS PATIENT NAME IN CARDINAL

## 2015-04-16 NOTE — BHH Suicide Risk Assessment (Signed)
[facility] Admission Suicide Risk Assessment   Nursing information obtained from:    Demographic factors:    Current Mental Status:    Loss Factors:    Historical Factors:    Risk Reduction Factors:     Total Time spent with patient: 1 hour Principal Problem: <principal problem not specified> Diagnosis:   Patient Active Problem List   Diagnosis Date Noted  . OSA (obstructive sleep apnea) [G47.33] 04/16/2015  . Obesity [E66.9] 04/16/2015  . Schizoaffective disorder, depressive type ([place]) [F25.1] 04/15/2015  . Hypertension [I10] 10/05/2012   Subjective Data:   Continued Clinical Symptoms:  Alcohol Use Disorder Identification Test Final Score (AUDIT): 3 The "Alcohol Use Disorders Identification Test", Guidelines for Use in Primary Care, Second Edition.  World Pharmacologist [facility]). Score between 0-7:  no or low risk or alcohol related problems. Score between 8-15:  moderate risk of alcohol related problems. Score between 16-19:  high risk of alcohol related problems. Score 20 or above:  warrants further diagnostic evaluation for alcohol dependence and treatment.   CLINICAL FACTORS:   Schizophrenia:   Paranoid or undifferentiated type Currently Psychotic Previous Psychiatric Diagnoses and Treatments    Psychiatric Specialty Exam: ROS  Blood pressure 108/70, pulse 101, temperature 98.7 F (37.1 C), temperature source Oral, resp. rate 18, height 5' (1.524 m), weight 104.781 kg (231 lb), last menstrual period 03/27/2015, SpO2 97 %.Body mass index is 45.11 kg/(m^2).                                                        COGNITIVE FEATURES THAT CONTRIBUTE TO RISK:  None    SUICIDE RISK:   Mild:  Suicidal ideation of limited frequency, intensity, duration, and specificity.  There are no identifiable plans, no associated intent, mild dysphoria and related symptoms, good self-control (both objective and subjective assessment), few other risk factors, and  identifiable protective factors, including available and accessible social support.  PLAN OF CARE: admit to [facility]  I certify that inpatient services furnished can reasonably be expected to improve the patient's condition.   Hildred Priest, MD 04/16/2015, 11:15 AM

## 2015-04-16 NOTE — BHH Counselor (Addendum)
Adult Comprehensive Assessment  Patient ID: Jenna Hill, female   DOB: 07/05/1970, 45 y.o.   MRN: [id number]  Information Source: Information source: Patient  Current Stressors:     Living/Environment/Situation:  Living Arrangements: Alone Living conditions (as described by patient or guardian): just moved back to [facility] and is planning to move back to [place] How long has patient lived in current situation?: 3 years  Family History:  Marital status: Divorced Divorced, when?: 2004 What types of issues is patient dealing with in the relationship?: denies issues Does patient have children?: No (ex husband had children and moved back to [place])  Childhood History:  By whom was/is the patient raised?: Both parents Description of patient's relationship with caregiver when they were a child: very good relationship growing up Patient's description of current relationship with people who raised him/her: same, great relationship with both parents How were you disciplined when you got in trouble as a child/adolescent?: spankings Does patient have siblings?: Yes Number of Siblings: 8 Description of patient's current relationship with siblings: good relationship, they are supportive Did patient suffer any verbal/emotional/physical/sexual abuse as a child?: No Did patient suffer from severe childhood neglect?: No Has patient ever been sexually abused/assaulted/raped as an adolescent or adult?: No Was the patient ever a victim of a crime or a disaster?: No Witnessed domestic violence?: No Has patient been effected by domestic violence as an adult?: No  Education:  Highest grade of school patient has completed: 6 years at [employer] Currently a student?: No Learning disability?: No  Employment/Work Situation:   Employment situation: Unemployed (waiting on Disability, unemployed since 2009. parents support) Patient's job has been impacted by current illness: Yes Describe  how patient's job has been impacted: not able to work a regular job What is the longest time patient has a held a job?: 2005-2009 Where was the patient employed at that time?: [employer] Has patient ever been in the [employer]?: No Has patient ever served in combat?: No Did You Receive Any Psychiatric Treatment/Services While in Passenger transport manager?: No Are There Guns or Chiropractor in [place]?: No Are These Psychologist, educational?:  (n/a)  Financial Resources:   Museum/gallery curator resources: Support from parents / caregiver (waiting on disability) Does patient have a Programmer, applications or guardian?: No  Alcohol/Substance Abuse:   What has been your use of drugs/alcohol within the last 12 months?: patient denies If attempted suicide, did drugs/alcohol play a role in this?: No Alcohol/Substance Abuse Treatment Hx: Denies past history Has alcohol/substance abuse ever caused legal problems?: No  Social Support System:   Pensions consultant Support System: Good Describe Community Support System: parents, siblings Type of faith/religion: denies How does patient's faith help to cope with current illness?: n/a  Leisure/Recreation:   Leisure and Hobbies: ride my motorcycle and reading  Strengths/Needs:   What things does the patient do well?: family In what areas does patient struggle / problems for patient: family is my weakness as well  Discharge Plan:   Does patient have access to transportation?: Yes 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[place]) Will patient be returning to same living situation after discharge?: Yes (but plans to move to [place] in the next week) Currently receiving community mental health services: Yes (From [facility] in [place]) Does patient have financial barriers related to discharge medications?: No Patient description of barriers related to discharge medications: Patient reports clinic assists her with medications  Summary/Recommendations:   Summary and  Recommendations (to be completed by the  evaluator): Patient is a 45 year old AA female admitted with a diagnosis of Schizoaffective disorder, depressive type. Patient presented to the hopital with [place]. Patient reports primary triggers for admission was not being on her medications. Patient does not have insurance but has applied for disability and has been disabled since 2009. Patient lives in [place] for the past 3 months but has been seen in [place] where she has been living recently but cannot go back to [facility] [phone]) as they no longer take patient's with no insurance. Patient would have to go to [facility] [phone] in [place] or [facility] [phone]) in [place] depending on where she discharges to. Patient will benefit from crisis stabilization, medication evaluation, group therapy and psycho education in addition to  case management for discharge planning. At discharge, it is recommended that patient remain compliant with established discharge plan and continued treatment.   Carmell Austria T., MSW, Latanya Presser  04/16/2015  [phone]

## 2015-04-16 NOTE — BHH Group Notes (Signed)
BHH LCSW Aftercare Discharge Planning Group Note  04/16/2015 9:30 AM  Participation Quality: Did Not Attend. Patient invited to participate but declined.   Firman Petrow F. Vollie Aaron, MSW, LCSWA, LCAS   
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
## 2015-04-16 NOTE — Progress Notes (Signed)
D: Ailen has been calm, polite, and isolative this a.m. She has denied SI and HI, though she has admitted hearing voices in a foreign language. She contracts for safety. She signed a 72-hour request for discharge. MD aware. No other complaints/concerns verbalized. A: Meds given as ordered. Q15 safety checks maintained. Support/encouragement offered. R: Pt remains free from harm and continues with treatment. Will continue to monitor for needs/safety.

## 2015-04-16 NOTE — Tx Team (Signed)
Initial Interdisciplinary Treatment Plan   PATIENT STRESSORS: Financial difficulties Medication change or noncompliance   PATIENT STRENGTHS: Curator fund of knowledge Motivation for treatment/growth   PROBLEM LIST: Problem List/Patient Goals Date to be addressed Date deferred Reason deferred Estimated date of resolution  psychosis 04/15/15           Depression 04/15/15           Suicidal ideation 04/15/15                              DISCHARGE CRITERIA:  Improved stabilization in mood, thinking, and/or behavior Motivation to continue treatment in a less acute level of care Verbal commitment to aftercare and medication compliance  PRELIMINARY DISCHARGE PLAN: Outpatient therapy  PATIENT/FAMIILY INVOLVEMENT: This treatment plan has been presented to and reviewed with the patient, Jenna Hill,   The patient and family have been given the opportunity to ask questions and make suggestions.  Laurenashley Viar Abisola Shantele Reller 04/16/2015, 5:25 AM

## 2015-04-17 LAB — TSH: TSH: 1.358 u[IU]/mL (ref 0.350–4.500)

## 2015-04-17 LAB — LIPID PANEL
CHOLESTEROL: 147 mg/dL (ref 0–200)
HDL: 52 mg/dL (ref >40)
LDL CALC: 81 mg/dL (ref 0–99)
TRIGLYCERIDES: 68 mg/dL (ref <150)
Total CHOL/HDL Ratio: 2.8 RATIO
VLDL: 14 mg/dL (ref 0–40)

## 2015-04-17 LAB — HEMOGLOBIN A1C: Hgb A1c MFr Bld: 6.3 % — ABNORMAL HIGH (ref 4.0–6.0)

## 2015-04-17 MED ORDER — METOPROLOL TARTRATE 25 MG PO TABS
50.0000 mg | ORAL_TABLET | Freq: Two times a day (BID) | ORAL | Status: DC
  Administered 2015-04-17 – 2015-04-22 (×10): 50 mg via ORAL
  Administered 2015-04-22: 25 mg via ORAL
  Administered 2015-04-23 – 2015-04-24 (×3): 50 mg via ORAL
  Filled 2015-04-17 (×15): qty 2

## 2015-04-17 MED ORDER — DICLOFENAC SODIUM 1 % TD GEL
4.0000 g | Freq: Three times a day (TID) | TRANSDERMAL | Status: DC
  Administered 2015-04-17 – 2015-04-24 (×7): 4 g via TOPICAL
  Filled 2015-04-17: qty 100

## 2015-04-17 MED ORDER — CLONAZEPAM 0.5 MG PO TABS
0.5000 mg | ORAL_TABLET | Freq: Three times a day (TID) | ORAL | Status: DC
  Administered 2015-04-17 – 2015-04-24 (×22): 0.5 mg via ORAL
  Filled 2015-04-17 (×22): qty 1

## 2015-04-17 NOTE — Progress Notes (Signed)
Patient is seclusive in room.Her mood is sad & anxious.Stated that she is still hearing voices,tells " You are going to miss the social security appointment on 28th.".Stated that this voice is with her since she was 65.Denies suicidal & homicidal ideations.Minimal interactions with peers.Compliant with medications.

## 2015-04-17 NOTE — BHH Group Notes (Signed)
[facility] Group Notes:  (Nursing/MHT/Case Management/Adjunct)  Date:  04/17/2015  Time:  9:26 AM  Type of Therapy:  Community Meeting   Participation Level:  Did Not Attend  Jenna Hill 04/17/2015, 9:26 AM

## 2015-04-17 NOTE — Plan of Care (Signed)
Problem: Alteration in mood Goal: LTG-Patient reports reduction in suicidal thoughts (Patient reports reduction in suicidal thoughts and is able to verbalize a safety plan for whenever patient is feeling suicidal)  Outcome: Progressing Patient denies SI.      

## 2015-04-17 NOTE — Progress Notes (Signed)
Called by RN to pick up CPAP machine due to pt refusing to wear.

## 2015-04-17 NOTE — Progress Notes (Signed)
Patient suddenly expressed to the nurse she didn't want the CPAP machine because she was having suicidal thoughts about strangling herself with CPAP cords. Machine was subsequently taken away from her room and baby monitor turned off. Will continue to welcome.

## 2015-04-17 NOTE — Progress Notes (Signed)
[facility] MD Progress Note  04/17/2015 12:10 PM Jenna Hill  MRN:  [id number] Subjective: Patient reports feeling scared but is unable to elaborate as to why she is feeling this way. She is requesting something for anxiety. She continues to hear his voices in different languages that communicate messages to her. She denies these messages are distressing to her. Yesterday during assessment patient was clearly interacting to internal stimuli and is speaking in different languages back to the voices.  Patient has been is stating that she is able to speak multiple languages that she has never learned or studied in the past.  She was able to sleep well last night. She denies major problems with her appetite. Denies suicidality, homicidality.  Per nursing: Patient is seclusive in room.Her mood is sad & anxious.Stated that she is still hearing voices,tells " You are going to miss the social security appointment on 28th.".Stated that this voice is with her since she was 56.Denies suicidal & homicidal ideations.Minimal interactions with peers.Compliant with medications.  Principal Problem: Schizoaffective disorder, depressive type ([place]) Diagnosis:   Patient Active Problem List   Diagnosis Date Noted  . OSA (obstructive sleep apnea) [G47.33] 04/16/2015  . Obesity [E66.9] 04/16/2015  . Schizoaffective disorder, depressive type ([place]) [F25.1] 04/15/2015  . Hypertension [I10] 10/05/2012   Total Time spent with patient: 30 minutes   Past Psychiatric History: Patient reports being diagnosed with schizoaffective disorder back in 1990. Patient does report prior hospitalizations. She said the last 2 recent hospitalizations were in February at different facilities in [place]. She denies any history of self-injurious behaviors or suicidal attempts.    Past Medical History: Patient reports suffering from hypertension. She reports having a seizure in 2004 after a concussion. She does not remember where she  lost consciousness. She also had some surgeries on her right hand for assessed and on her right ankle for a fracture.   Family History: Denies any family history of mental illness, substance abuse or suicide  Social History: Patient states she lives by herself in an apartment in [place]. Does not have any income and has been out of work since 2009. She says she receives support from her parents live in [place]. 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 Patient says she has a bachelor's degree.  Patient is currently divorced. She was married for 9 years and has been divorced since 2014. She denies having any children.  Social History:  History  Alcohol Use  . 1.2 oz/week  . 2 Cans of beer per week     History  Drug Use No    Social History   Social History  . Marital Status: Married    Spouse Name: N/A  . Number of Children: N/A  . Years of Education: N/A   Social History Main Topics  . Smoking status: Never Smoker   . Smokeless tobacco: Never Used  . Alcohol Use: 1.2 oz/week    2 Cans of beer per week  . Drug Use: No  . Sexual Activity: Not Currently   Other Topics Concern  . None   Social History Narrative   Additional Social History:    Pain Medications: none reported Prescriptions: Metroprolol 50mg ; Norvasc 10mg ; Xanax; Haldol 40mg ; Seroquel 400 mg; Ambien10mg  Over the Counter: none reported History of alcohol / drug use?: No history of alcohol / drug abuse   Current Medications: Current Facility-Administered  Medications  Medication Dose Route Frequency Provider Last Rate Last Dose  . acetaminophen (TYLENOL) tablet 650 mg  650 mg Oral Q6H PRN Hildred Priest, MD      . alum & mag hydroxide-simeth (MAALOX/MYLANTA) 200-200-20 MG/5ML suspension 30 mL  30 mL Oral Q4H PRN Hildred Priest, MD      . amLODipine (NORVASC) tablet 10 mg  10 mg Oral  Daily Hildred Priest, MD   10 mg at 04/17/15 0809  . clonazePAM (KLONOPIN) tablet 0.5 mg  0.5 mg Oral TID Hildred Priest, MD      . ibuprofen (ADVIL,MOTRIN) tablet 600 mg  600 mg Oral Q6H PRN Hildred Priest, MD   600 mg at 04/16/15 2146  . magnesium hydroxide (MILK OF MAGNESIA) suspension 30 mL  30 mL Oral Daily PRN Hildred Priest, MD      . metoprolol tartrate (LOPRESSOR) tablet 50 mg  50 mg Oral BID Hildred Priest, MD      . paliperidone (INVEGA) 24 hr tablet 9 mg  9 mg Oral QHS Hildred Priest, MD   9 mg at 04/16/15 2146  . traZODone (DESYREL) tablet 100 mg  100 mg Oral QHS Hildred Priest, MD   100 mg at 04/16/15 2146    Lab Results:  Results for orders placed or performed during the hospital encounter of 04/15/15 (from the past 48 hour(s))  Lipid panel     Status: None   Collection Time: 04/17/15  7:40 AM  Result Value Ref Range   Cholesterol 147 0 - 200 mg/dL   Triglycerides 68 <150 mg/dL   HDL 52 >40 mg/dL   Total CHOL/HDL Ratio 2.8 RATIO   VLDL 14 0 - 40 mg/dL   LDL Cholesterol 81 0 - 99 mg/dL    Comment:        Total Cholesterol/HDL:CHD Risk Coronary Heart Disease Risk Table                     Men   Women  1/2 Average Risk   3.4   3.3  Average Risk       5.0   4.4  2 X Average Risk   9.6   7.1  3 X Average Risk  23.4   11.0        Use the calculated Patient Ratio above and the CHD Risk Table to determine the patient's CHD Risk.        ATP III CLASSIFICATION (LDL):  <100     mg/dL   Optimal  100-129  mg/dL   Near or Above                    Optimal  130-159  mg/dL   Borderline  160-189  mg/dL   High  >190     mg/dL   Very High   TSH     Status: None   Collection Time: 04/17/15  7:40 AM  Result Value Ref Range   TSH 1.358 0.350 - 4.500 uIU/mL    Blood Alcohol level:  Lab Results  Component Value Date   ETH <5 04/14/2015    Physical Findings: AIMS: Facial and Oral Movements Muscles  of Facial Expression: None, normal Lips and Perioral Area: None, normal Jaw: None, normal Tongue: None, normal,Extremity Movements Upper (arms, wrists, hands, fingers): None, normal Lower (legs, knees, ankles, toes): None, normal, Trunk Movements Neck, shoulders, hips: None, normal, Overall Severity Severity of abnormal movements (highest score from questions above): None, normal Incapacitation due to  abnormal movements: None, normal Patient's awareness of abnormal movements (rate only patient's report): No Awareness, Dental Status Current problems with teeth and/or dentures?: No Does patient usually wear dentures?: No  CIWA:  CIWA-Ar Total: 0 COWS:  COWS Total Score: 2  Musculoskeletal: Strength & Muscle Tone: within normal limits Gait & Station: normal Patient leans: N/A  Psychiatric Specialty Exam: Review of Systems  Constitutional: Negative.   HENT: Negative.   Eyes: Negative.   Respiratory: Negative.   Cardiovascular: Negative.   Gastrointestinal: Negative.   Genitourinary: Negative.   Musculoskeletal: Positive for joint pain.  Skin: Negative.   Neurological: Negative.   Endo/Heme/Allergies: Negative.   Psychiatric/Behavioral: Positive for hallucinations. The patient is nervous/anxious.     Blood pressure 142/87, pulse 115, temperature 98 F (36.7 C), temperature source Oral, resp. rate 18, height 5' (1.524 m), weight 104.781 kg (231 lb), last menstrual period 03/27/2015, SpO2 97 %.Body mass index is 45.11 kg/(m^2).  General Appearance: Fairly Groomed  Engineer, water::  Good  Speech:  Clear and Coherent  Volume:  Normal  Mood:  Anxious  Affect:  Constricted  Thought Process:  Tangential  Orientation:  Full (Time, Place, and Person)  Thought Content:  Hallucinations: Auditory  Suicidal Thoughts:  No  Homicidal Thoughts:  No  Memory:  Immediate;   Fair Recent;   Fair Remote;   Fair  Judgement:  Poor  Insight:  Lacking  Psychomotor Activity:  Decreased   Concentration:  Fair  Recall:  [employer] of Knowledge:Fair  Language: Fair  Akathisia:  No  Handed:    AIMS (if indicated):     Assets:  Armed forces logistics/support/administrative officer Physical Health  ADL's:  Intact  Cognition: WNL  Sleep:  Number of Hours: 8.15   Treatment Plan Summary:   Schizoaffective disorder: There is no records Homecare everywhere from her recent hospitalizations in [place]. Patient has been started on Invega 9 mg by mouth daily at bedtime. No changes will be made today  Insomnia: sleeping well. Refused CPAP last night.  Will continue trazodone 100 mg po qhs. I will d/c seroquel in order to minimize medication regimen and avoid polypharmacy.  Hypertension patient will be continued on Norvasc 10 mg a day and Lopressor.  As her heart rate and blood pressure are elevated today I will increase the Lopressor from 50 mg daily to 50 mg twice a day.   Obstructive sleep apnea: Patient suffers from obstructive sleep apnea. Refused CPAP last night  Knee pain: I will under Voltaren gel when necessary  Precautions every 15 minute checks  Hospitalization and status patient is currently voluntary. Yesterday she signed a 72 hour discharge request form. I explained to the patient that I was not planning on discharging her tomorrow despite her request for discharge. Patient was in agreement with resending the form.  Diet low sodium  Discharge disposition back home once stable  Discharge follow she will need follow-up in [place] : TSH and lipid panel within the normal limits. Pending prolactin and hemoglobin A1c.  Social worker will obtain collateral information from the patient's parents.  Hildred Priest, MD   04/17/2015, 12:10 PM

## 2015-04-17 NOTE — [facility] Group Notes (Signed)
[facility] LCSW Group Therapy   04/17/2015 11am  Type of Therapy: Group Therapy   Participation Level: Did Not Attend. Patient invited to participate but declined.    [name]. Lavon Bothwell, MSW, LCSWA, LCAS

## 2015-04-17 NOTE — Tx Team (Signed)
Interdisciplinary Treatment Plan Update (Adult)  Date:  04/17/2015 Time Reviewed:  12:11 PM  Progress in Treatment: Attending groups: No. Participating in groups:  No. Taking medication as prescribed:  Yes. Tolerating medication:  Yes. Family/Significant othe contact made:  No, will contact:  patient's mother Patient understands diagnosis:  No. Discussing patient identified problems/goals with staff:  Yes. Medical problems stabilized or resolved:  Yes. Denies suicidal/homicidal ideation: Yes. Issues/concerns per patient self-inventory:  Yes. Other:  New problem(s) identified: No, Describe:  none reported  Discharge Plan or Barriers: Patient will stabilize and discharge home with outpatient mental health follow up.  Reason for Continuation of Hospitalization: Depression Hallucinations Medication stabilization Suicidal ideation  Comments:  Estimated length of stay: up to 7 days  New goal(s):  Review of initial/current patient goals per problem list:   1.  Goal(s): Participate in aftercare plan   Met:  No  Target date: by discharge  As evidenced by: patient will participate in aftercare plan AEB aftercare provider and housing plan identified at discharge 04/17/15: Patient wants to discharge home and follow up with [facility] in [place] but may be too disorganized to make a decision at this time.   2.  Goal (s): Decrease depression   Met:  No  Target date: by discharge  As evidenced by: patient demonstrates decreased symptoms of depression and reports a Depression rating of 3 or less 04/17/15: Patient remains in room and is sad and anxious but reports no SI.   3.  Goal(s): Decrease psychosis   Met:  No  Target date: by discharge  As evidenced by: patient demonstrates decreased symptoms of psychosis 04/17/15: patient responding to internal stimuli speaking to herself. Patient reports she is still hearing voices.    Attendees: Physician:  Merlyn Albert, MD  3/16/201712:11 PM  Nursing:   Tyler Pita, RN 3/16/201712:11 PM  Other:  Carmell Austria, [place] 3/16/201712:11 PM  Other:   3/16/201712:11 PM  Other:   3/16/201712:11 PM  Other:  3/16/201712:11 PM  Other:  3/16/201712:11 PM  Other:  3/16/201712:11 PM  Other:  3/16/201712:11 PM  Other:  3/16/201712:11 PM  Other:  3/16/201712:11 PM  Other:   3/16/201712:11 PM   Scribe for Treatment Team:   Keene Breath, MSW, [place]  04/17/2015, 12:11 PM

## 2015-04-17 NOTE — Progress Notes (Signed)
Recreation Therapy Notes  Date: 03.16.17 Time: 3:00 pm Location: Craft Room  Group Topic: Leisure Education  Goal Area(s) Addresses:  Patient will identify activities for each letter of the alphabet. Patient will verbalize ability to integrate positive leisure into life post d/c. Patient will verbalize ability to use leisure as a Technical sales engineer.  Behavioral Response: Did not attend  Intervention: Leisure Alphabet  Activity: Patients were given a Leisure Air traffic controller and instructed to list a healthy leisure activity for each letter of the alphabet.  Education: LRT educated patients on what is needed to participate in leisure.  Education Outcome: Patient did not attend group.   Clinical Observations/Feedback: Patient did not attend group.  Leonette Monarch, LRT/CTRS 04/17/2015 4:15 PM

## 2015-04-17 NOTE — Progress Notes (Signed)
D: Pt denies SI/HI/AVH. Pt is pleasant and cooperative. Affect flat and sad. Patient isolates to self. Patient appears less anxious and she is interacting with staff appropriately.  A: Pt was offered support and encouragement. Pt was given scheduled medications. Pt was encouraged to attend groups. Q 15 minute checks were done for safety.  R:Pt attends groups and interacts well with peers and staff. Pt is taking medication. Pt has no complaints.Pt receptive to treatment and safety maintained on unit.

## 2015-04-17 NOTE — [facility] Group Notes (Signed)
[facility] Group Notes:  (Nursing/MHT/Case Management/Adjunct)  Date:  04/17/2015  Time:  2:10 PM  Type of Therapy:  Group Therapy  Participation Level:  Active  Participation Quality:  Appropriate and Attentive  Affect:  Appropriate  Cognitive:  Alert, Appropriate and Oriented  Insight:  Appropriate  Engagement in Group:  Engaged  Modes of Intervention:  Activity and Discussion  Summary of Progress/Problems:  Chriss Redel De'Chelle Princesa Willig 04/17/2015, 2:10 PM

## 2015-04-17 NOTE — [facility] Suicide Risk Assessment (Signed)
[place] INPATIENT:  Family/Significant Other Suicide Prevention Education  Suicide Prevention Education:  Education Completed; Hyacinth Meeker (mother) [phone] has been identified by the patient as the family member/significant other with whom the patient will be residing, and identified as the person(s) who will aid the patient in the event of a mental health crisis (suicidal ideations/suicide attempt).  With written consent from the patient, the family member/significant other has been provided the following suicide prevention education, prior to the and/or following the discharge of the patient.  The suicide prevention education provided includes the following:  Suicide risk factors  Suicide prevention and interventions  National Suicide Hotline telephone number  [facility] assessment telephone number  [facility] Emergency Assistance [place] and/or Residential Mobile Crisis Unit telephone number  Request made of family/significant other to:  Remove weapons (e.g., guns, rifles, knives), all items previously/currently identified as safety concern.    Remove drugs/medications (over-the-counter, prescriptions, illicit drugs), all items previously/currently identified as a safety concern.  The family member/significant other verbalizes understanding of the suicide prevention education information provided.  The family member/significant other agrees to remove the items of safety concern listed above.  Keene Breath, MSW, LCSWA 04/17/2015, 12:34 PM  [phone]

## 2015-04-18 LAB — PROLACTIN: Prolactin: 291.8 ng/mL — ABNORMAL HIGH (ref 4.8–23.3)

## 2015-04-18 MED ORDER — AMANTADINE HCL 100 MG PO CAPS
100.0000 mg | ORAL_CAPSULE | Freq: Two times a day (BID) | ORAL | Status: DC
  Administered 2015-04-18 – 2015-04-24 (×13): 100 mg via ORAL
  Filled 2015-04-18 (×13): qty 1

## 2015-04-18 MED ORDER — PALIPERIDONE ER 3 MG PO TB24
12.0000 mg | ORAL_TABLET | Freq: Every day | ORAL | Status: DC
  Administered 2015-04-18 – 2015-04-23 (×6): 12 mg via ORAL
  Filled 2015-04-18 (×6): qty 4

## 2015-04-18 MED ORDER — PALIPERIDONE PALMITATE 234 MG/1.5ML IM SUSP
234.0000 mg | Freq: Once | INTRAMUSCULAR | Status: AC
  Administered 2015-04-19: 234 mg via INTRAMUSCULAR
  Filled 2015-04-18: qty 1.5

## 2015-04-18 NOTE — Plan of Care (Signed)
Problem: Alteration in mood Goal: LTG-Patient reports reduction in suicidal thoughts (Patient reports reduction in suicidal thoughts and is able to verbalize a safety plan for whenever patient is feeling suicidal)  Outcome: Progressing Patient denies SI/HI.      

## 2015-04-18 NOTE — Progress Notes (Signed)
[facility] MD Progress Note  04/18/2015 11:30 AM Jenna Hill  MRN:  [id number] Subjective: Patient reports feeling better this am but she is still hearing voices that talk to her in different languages.  They have not decreased in frequency or intensity since admission.  Pt denies SE from medications.  Denies problems with sleep, energy or appetite.   She was able to sleep well last night. She denies major problems with her appetite. Denies suicidality, homicidality.  Per nursing: D: Pt denies SI/HI/AVH. Pt is pleasant and cooperative. Affect flat and sad, but brightens upon approach. Patient isolates to self today but appears less anxious. Pt is interacting with staff appropriately.  A: Pt was offered support and encouragement. Pt was given scheduled medications. Pt was encouraged to attend groups. Q 15 minute checks were done for safety.  R:Pt did not attends groups and interacts well with peers and staff. Pt is taking medication. Pt has no complaints.Pt receptive to treatment and safety maintained on unit.  Principal Problem: Schizoaffective disorder, depressive type ([place]) Diagnosis:   Patient Active Problem List   Diagnosis Date Noted  . OSA (obstructive sleep apnea) [G47.33] 04/16/2015  . Obesity [E66.9] 04/16/2015  . Schizoaffective disorder, depressive type ([place]) [F25.1] 04/15/2015  . Hypertension [I10] 10/05/2012   Total Time spent with patient: 30 minutes   Past Psychiatric History: Patient reports being diagnosed with schizoaffective disorder back in 1990. Patient does report prior hospitalizations. She said the last 2 recent hospitalizations were in February at different facilities in [place]. She denies any history of self-injurious behaviors or suicidal attempts.    Past Medical History: Patient reports suffering from hypertension. She reports having a seizure in 2004 after a concussion. She does not remember where she lost consciousness. She also had some surgeries on  her right hand for assessed and on her right ankle for a fracture.   Family History: Denies any family history of mental illness, substance abuse or suicide  Social History: Patient states she lives by herself in an apartment in [place]. Does not have any income and has been out of work since 2009. She says she receives support from her parents live in [place]. 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 Patient says she has a bachelor's degree.  Patient is currently divorced. She was married for 9 years and has been divorced since 2014. She denies having any children.  Social History:  History  Alcohol Use  . 1.2 oz/week  . 2 Cans of beer per week     History  Drug Use No    Social History   Social History  . Marital Status: Married    Spouse Name: N/A  . Number of Children: N/A  . Years of Education: N/A   Social History Main Topics  . Smoking status: Never Smoker   . Smokeless tobacco: Never Used  . Alcohol Use: 1.2 oz/week    2 Cans of beer per week  . Drug Use: No  . Sexual Activity: Not Currently   Other Topics Concern  . None   Social History Narrative   Additional Social History:    Pain Medications: none reported Prescriptions: Metroprolol 50mg ; Norvasc 10mg ; Xanax; Haldol 40mg ; Seroquel 400 mg; Ambien10mg  Over the Counter: none reported History of alcohol / drug use?: No history of alcohol / drug abuse   Current Medications: Current Facility-Administered Medications  Medication  Dose Route Frequency Provider Last Rate Last Dose  . acetaminophen (TYLENOL) tablet 650 mg  650 mg Oral Q6H PRN Hildred Priest, MD      . alum & mag hydroxide-simeth (MAALOX/MYLANTA) 200-200-20 MG/5ML suspension 30 mL  30 mL Oral Q4H PRN Hildred Priest, MD      . amLODipine (NORVASC) tablet 10 mg  10 mg Oral Daily Hildred Priest, MD   10 mg at  04/18/15 0846  . clonazePAM (KLONOPIN) tablet 0.5 mg  0.5 mg Oral TID Hildred Priest, MD   0.5 mg at 04/18/15 0846  . diclofenac sodium (VOLTAREN) 1 % transdermal gel 4 g  4 g Topical TID Hildred Priest, MD   4 g at 04/17/15 1503  . ibuprofen (ADVIL,MOTRIN) tablet 600 mg  600 mg Oral Q6H PRN Hildred Priest, MD   600 mg at 04/18/15 0846  . magnesium hydroxide (MILK OF MAGNESIA) suspension 30 mL  30 mL Oral Daily PRN Hildred Priest, MD      . metoprolol tartrate (LOPRESSOR) tablet 50 mg  50 mg Oral BID Hildred Priest, MD   50 mg at 04/18/15 0846  . paliperidone (INVEGA) 24 hr tablet 9 mg  9 mg Oral QHS Hildred Priest, MD   9 mg at 04/17/15 2115  . traZODone (DESYREL) tablet 100 mg  100 mg Oral QHS Hildred Priest, MD   100 mg at 04/17/15 2115    Lab Results:  Results for orders placed or performed during the hospital encounter of 04/15/15 (from the past 48 hour(s))  Lipid panel     Status: None   Collection Time: 04/17/15  7:40 AM  Result Value Ref Range   Cholesterol 147 0 - 200 mg/dL   Triglycerides 68 <150 mg/dL   HDL 52 >40 mg/dL   Total CHOL/HDL Ratio 2.8 RATIO   VLDL 14 0 - 40 mg/dL   LDL Cholesterol 81 0 - 99 mg/dL    Comment:        Total Cholesterol/HDL:CHD Risk Coronary Heart Disease Risk Table                     Men   Women  1/2 Average Risk   3.4   3.3  Average Risk       5.0   4.4  2 X Average Risk   9.6   7.1  3 X Average Risk  23.4   11.0        Use the calculated Patient Ratio above and the CHD Risk Table to determine the patient's CHD Risk.        ATP III CLASSIFICATION (LDL):  <100     mg/dL   Optimal  100-129  mg/dL   Near or Above                    Optimal  130-159  mg/dL   Borderline  160-189  mg/dL   High  >190     mg/dL   Very High   TSH     Status: None   Collection Time: 04/17/15  7:40 AM  Result Value Ref Range   TSH 1.358 0.350 - 4.500 uIU/mL  Hemoglobin A1c     Status:  Abnormal   Collection Time: 04/17/15  7:40 AM  Result Value Ref Range   Hgb A1c MFr Bld 6.3 (H) 4.0 - 6.0 %  Prolactin     Status: Abnormal   Collection Time: 04/17/15  7:40 AM  Result Value Ref Range  Prolactin 291.8 (H) 4.8 - 23.3 ng/mL    Comment: (NOTE) Performed At: [facility] [place], [place] [id number] Lindon Romp MD [id number]     Blood Alcohol level:  Lab Results  Component Value Date   [facility] <5 04/14/2015    Physical Findings: AIMS: Facial and Oral Movements Muscles of Facial Expression: None, normal Lips and Perioral Area: None, normal Jaw: None, normal Tongue: None, normal,Extremity Movements Upper (arms, wrists, hands, fingers): None, normal Lower (legs, knees, ankles, toes): None, normal, Trunk Movements Neck, shoulders, hips: None, normal, Overall Severity Severity of abnormal movements (highest score from questions above): None, normal Incapacitation due to abnormal movements: None, normal Patient's awareness of abnormal movements (rate only patient's report): No Awareness, Dental Status Current problems with teeth and/or dentures?: No Does patient usually wear dentures?: No  CIWA:  CIWA-Ar Total: 0 COWS:  COWS Total Score: 2  Musculoskeletal: Strength & Muscle Tone: within normal limits Gait & Station: normal Patient leans: N/A  Psychiatric Specialty Exam: Review of Systems  Constitutional: Negative.   HENT: Negative.   Eyes: Negative.   Respiratory: Negative.   Cardiovascular: Negative.   Gastrointestinal: Negative.   Genitourinary: Negative.   Musculoskeletal: Positive for joint pain.  Skin: Negative.   Neurological: Negative.   Endo/Heme/Allergies: Negative.   Psychiatric/Behavioral: Positive for hallucinations. The patient is nervous/anxious.     Blood pressure 142/90, pulse 92, temperature 98.6 F (37 C), temperature source Oral, resp. rate 20, height 5' (1.524 m), weight 104.781 kg (231 lb), last  menstrual period 03/27/2015, SpO2 97 %.Body mass index is 45.11 kg/(m^2).  General Appearance: Fairly Groomed  Engineer, water::  Good  Speech:  Clear and Coherent  Volume:  Normal  Mood:  Anxious  Affect:  Constricted  Thought Process:  Tangential  Orientation:  Full (Time, Place, and Person)  Thought Content:  Hallucinations: Auditory  Suicidal Thoughts:  No  Homicidal Thoughts:  No  Memory:  Immediate;   Fair Recent;   Fair Remote;   Fair  Judgement:  Poor  Insight:  Lacking  Psychomotor Activity:  Decreased  Concentration:  Fair  Recall:  [employer] of Knowledge:Fair  Language: Fair  Akathisia:  No  Handed:    AIMS (if indicated):     Assets:  Armed forces logistics/support/administrative officer Physical Health  ADL's:  Intact  Cognition: WNL  Sleep:  Number of Hours: 8   Treatment Plan Summary:   Schizoaffective disorder: There is no records Homecare everywhere from her recent hospitalizations in [place]. Patient has been started on Invega 9 mg by mouth daily at bedtime. Today I will increase it to 12 mg po q hs.  Will order invega sustenna 234 mg tomorrow.  Insomnia: sleeping well. Refused CPAPt.  Will continue trazodone 100 mg po qhs.   Hyperprolactinemia: will start amantadine 100mg  po bid.  Hypertension patient will be continued on Norvasc 10 mg a day and Lopressor which has been increased to 50 mg po bid   Obstructive sleep apnea: Patient suffers from obstructive sleep apnea. Refused CPAP last night  Knee pain: continue Voltaren gel when necessary  Precautions every 15 minute checks  Hospitalization and status patient is currently voluntary.   Diet low sodium  Discharge disposition back home once stable  Discharge follow she will need follow-up in [place] : TSH and lipid panel within the normal limits. Hemoglobin A1c 6.3 and Prolactin >200.  Social worker will obtain collateral information from the patient's parents.  Hildred Priest,  MD   04/18/2015, 11:30  AM

## 2015-04-18 NOTE — Progress Notes (Signed)
Patient is pleasant & cooperative on approach.Stated that still hearing voices in other languages.Denies suicidal or homicidal ideations.Appropriate with staff & peers.Compliant with medications.Attended some groups.Appetite & energy level good.

## 2015-04-18 NOTE — BHH Group Notes (Signed)
[facility] LCSW Group Therapy  04/18/2015 3:33 PM  Type of Therapy:  Group Therapy  Participation Level:  Did Not Attend    Keene Breath, MSW, LCSWA 04/18/2015, 3:33 PM

## 2015-04-18 NOTE — Progress Notes (Signed)
D: Pt denies SI/HI/AVH. Pt is pleasant and cooperative. Affect flat and sad, but brightens upon approach. Patient isolates to self today but  appears less anxious. Pt is interacting with staff appropriately.  A: Pt was offered support and encouragement. Pt was given scheduled medications. Pt was encouraged to attend groups. Q 15 minute checks were done for safety.  R:Pt did not attends groups and interacts well with peers and staff. Pt is taking medication. Pt has no complaints.Pt receptive to treatment and safety maintained on unit.

## 2015-04-18 NOTE — Progress Notes (Signed)
Recreation Therapy Notes  Date: 03.17.17 Time: 3:00 pm Location: Craft Room  Group Topic: Coping Skills  Goal Area(s) Addresses:  Patient will participate in healthy coping skill. Patient will verbalize benefit of using art as a coping skill.  Behavioral Response: Attentive, Left early  Intervention: Coloring  Activity: Patients were given coloring sheets instructed to color. While coloring, patients were encouraged to think about the emotions they were feeling and what they were focused on.  Education: LRT educated patients on healthy coping skills.  Education Outcome: Patient left before LRT educated group.  Clinical Observations/Feedback: Patient completed activity by coloring color sheet. Patient left group at approximately 3:37 pm. Patient did not return to group.  Leonette Monarch, LRT/CTRS 04/18/2015 4:37 PM

## 2015-04-18 NOTE — BHH Group Notes (Signed)
[facility] Group Notes:  (Nursing/MHT/Case Management/Adjunct)  Date:  04/18/2015  Time:  6:59 PM  Type of Therapy:  Group Therapy  Participation Level:  Did Not Attend    Ladona Mow 04/18/2015, 6:59 PM

## 2015-04-18 NOTE — BHH Group Notes (Signed)
[facility] LCSW Aftercare Discharge Planning Group Note   04/18/2015 1:24 PM  Participation Quality:  Did not attend.   [place] MSW, LCSWA

## 2015-04-19 NOTE — Plan of Care (Signed)
Problem: Ineffective individual coping Goal: STG: Patient will remain free from self harm Outcome: Progressing Pt safe on the unit at this time  Problem: Alteration in mood Goal: LTG-Patient reports reduction in suicidal thoughts (Patient reports reduction in suicidal thoughts and is able to verbalize a safety plan for whenever patient is feeling suicidal)  Outcome: Progressing Pt denies SI at this time  Problem: Alteration in thought process Goal: STG-Patient does not respond to command hallucinations Outcome: Not Progressing Pt +ve AH- at times is seen responding to [facility], having conversations with people not seen by observers.

## 2015-04-19 NOTE — Plan of Care (Signed)
Problem: Alteration in thought process Goal: LTG-Patient behavior demonstrates decreased signs psychosis (Patient behavior demonstrates decreased signs of psychosis to the point the patient is safe to return home and continue treatment in an outpatient setting.)  Outcome: Not Progressing Reports hearing voices.  Verbalizes that she does not understand because doesn't know what language they are speaking.

## 2015-04-19 NOTE — Progress Notes (Signed)
D: Pt endorses AH without command. Denies SI. Affect flat. Pleasant during interaction, although minimal. Appropriate with staff and peers. Medication compliant. Denies pain. A: Encouragement and support provided. Q15 minute checks maintained for safety. Medications given as prescribed. Administered Invega injection at 0000. R: Pt receptive to nursing interventions. Calm and pleasant during interaction. Voiced no additional concerns at this time. Will continue to monitor.

## 2015-04-19 NOTE — Progress Notes (Signed)
Pleasant and cooperative. Denies SI and HI.  Endorses auditory hallucinations.  Isolates to her room.  States that she stays in her room because she talks to herself and does not like being around other people when she does.  Support and encouragement offered.  Safety maintained.

## 2015-04-19 NOTE — Progress Notes (Signed)
[facility] MD Progress Note  04/19/2015 2:19 PM Jenna Hill  MRN:  [id number]   Subjective: Today, the patient is lying in bed but pleasant and cooperative. She states that she continues to hear voices "in other languages" that she has heard before. She has been taking her medication and denies SI/HI.   She slept ok, denies side effects, no issues with appetite. She finds the groups "boring" but has been attending.  Pt denies side effects.  .  Per nursing: D: Pt endorses AH without command. Denies SI. Affect flat. Pleasant during interaction, although minimal. Appropriate with staff and peers. Medication compliant. Denies pain. A: Encouragement and support provided. Q15 minute checks maintained for safety. Medications given as prescribed. Administered Invega injection at 0000. R: Pt receptive to nursing interventions. Calm and pleasant during interaction. Voiced no additional concerns at this time. Will continue to monitor.         Principal Problem: Schizoaffective disorder, depressive type ([place]) Diagnosis:   Patient Active Problem List   Diagnosis Date Noted  . OSA (obstructive sleep apnea) [G47.33] 04/16/2015  . Obesity [E66.9] 04/16/2015  . Schizoaffective disorder, depressive type ([place]) [F25.1] 04/15/2015  . Hypertension [I10] 10/05/2012   Total Time spent with patient: 30 minutes   Past Psychiatric History: Patient reports being diagnosed with schizoaffective disorder back in 1990. Patient does report prior hospitalizations. She said the last 2 recent hospitalizations were in February at different facilities in [place]. She denies any history of self-injurious behaviors or suicidal attempts.    Past Medical History: Patient reports suffering from hypertension. She reports having a seizure in 2004 after a concussion. She does not remember where she lost consciousness. She also had some surgeries on her right hand for assessed and on her right ankle for a  fracture.   Family History: Denies any family history of mental illness, substance abuse or suicide  Social History: Patient states she lives by herself in an apartment in [place]. Does not have any income and has been out of work since 2009. She says she receives support from her parents live in [place]. 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 Patient says she has a bachelor's degree.  Patient is currently divorced. She was married for 9 years and has been divorced since 2014. She denies having any children.  Social History:  History  Alcohol Use  . 1.2 oz/week  . 2 Cans of beer per week     History  Drug Use No    Social History   Social History  . Marital Status: Married    Spouse Name: N/A  . Number of Children: N/A  . Years of Education: N/A   Social History Main Topics  . Smoking status: Never Smoker   . Smokeless tobacco: Never Used  . Alcohol Use: 1.2 oz/week    2 Cans of beer per week  . Drug Use: No  . Sexual Activity: Not Currently   Other Topics Concern  . None   Social History Narrative   Additional Social History:    Pain Medications: none reported Prescriptions: Metroprolol 50mg ; Norvasc 10mg ; Xanax; Haldol 40mg ; Seroquel 400 mg; Ambien10mg  Over the Counter: none reported History of alcohol / drug use?: No history of alcohol / drug abuse   Current Medications: Current Facility-Administered Medications  Medication Dose Route Frequency Provider Last Rate Last Dose  . acetaminophen (TYLENOL) tablet  650 mg  650 mg Oral Q6H PRN Hildred Priest, MD      . alum & mag hydroxide-simeth (MAALOX/MYLANTA) 200-200-20 MG/5ML suspension 30 mL  30 mL Oral Q4H PRN Hildred Priest, MD   30 mL at 04/19/15 0033  . amantadine (SYMMETREL) capsule 100 mg  100 mg Oral BID Hildred Priest, MD   100 mg at 04/19/15 [id number]  . amLODipine  (NORVASC) tablet 10 mg  10 mg Oral Daily Hildred Priest, MD   10 mg at 04/19/15 0927  . clonazePAM (KLONOPIN) tablet 0.5 mg  0.5 mg Oral TID Hildred Priest, MD   0.5 mg at 04/19/15 [id number]  . diclofenac sodium (VOLTAREN) 1 % transdermal gel 4 g  4 g Topical TID Hildred Priest, MD   4 g at 04/17/15 1503  . ibuprofen (ADVIL,MOTRIN) tablet 600 mg  600 mg Oral Q6H PRN Hildred Priest, MD   600 mg at 04/18/15 0846  . magnesium hydroxide (MILK OF MAGNESIA) suspension 30 mL  30 mL Oral Daily PRN Hildred Priest, MD      . metoprolol tartrate (LOPRESSOR) tablet 50 mg  50 mg Oral BID Hildred Priest, MD   50 mg at 04/19/15 [id number]  . paliperidone (INVEGA) 24 hr tablet 12 mg  12 mg Oral QHS Hildred Priest, MD   12 mg at 04/18/15 2101  . traZODone (DESYREL) tablet 100 mg  100 mg Oral QHS Hildred Priest, MD   100 mg at 04/18/15 2101    Lab Results:  No results found for this or any previous visit (from the past 48 hour(s)).  Blood Alcohol level:  Lab Results  Component Value Date   ETH <5 04/14/2015    Physical Findings: AIMS: Facial and Oral Movements Muscles of Facial Expression: None, normal Lips and Perioral Area: None, normal Jaw: None, normal Tongue: None, normal,Extremity Movements Upper (arms, wrists, hands, fingers): None, normal Lower (legs, knees, ankles, toes): None, normal, Trunk Movements Neck, shoulders, hips: None, normal, Overall Severity Severity of abnormal movements (highest score from questions above): None, normal Incapacitation due to abnormal movements: None, normal Patient's awareness of abnormal movements (rate only patient's report): No Awareness, Dental Status Current problems with teeth and/or dentures?: No Does patient usually wear dentures?: No  CIWA:  CIWA-Ar Total: 0 COWS:  COWS Total Score: 2  Musculoskeletal: Strength & Muscle Tone: within normal limits Gait & Station:  normal Patient leans: N/A  Psychiatric Specialty Exam: Review of Systems  Constitutional: Negative.   HENT: Negative.   Eyes: Negative.   Respiratory: Negative.   Cardiovascular: Negative.   Gastrointestinal: Negative.   Genitourinary: Negative.   Musculoskeletal: Positive for joint pain.  Skin: Negative.   Neurological: Negative.   Endo/Heme/Allergies: Negative.   Psychiatric/Behavioral: Positive for hallucinations. The patient is nervous/anxious.     Blood pressure 140/81, pulse 104, temperature 98.6 F (37 C), temperature source Oral, resp. rate 20, height 5' (1.524 m), weight 104.781 kg (231 lb), last menstrual period 03/27/2015, SpO2 97 %.Body mass index is 45.11 kg/(m^2).  General Appearance: Fairly Groomed  Engineer, water::  Good  Speech:  Clear and Coherent  Volume:  Normal  Mood:  Anxious  Affect:  Constricted  Thought Process:  Tangential  Orientation:  Full (Time, Place, and Person)  Thought Content:  Hallucinations: Auditory  Suicidal Thoughts:  No  Homicidal Thoughts:  No  Memory:  Immediate;   Fair Recent;   Fair Remote;   Fair  Judgement:  Poor  Insight:  Lacking  Psychomotor Activity:  Decreased  Concentration:  Fair  Recall:  [employer] of Knowledge:Fair  Language: Fair  Akathisia:  No  Handed:    AIMS (if indicated):     Assets:  Communication Skills Physical Health  ADL's:  Intact  Cognition: WNL  Sleep:  Number of Hours: 7.45   Treatment Plan Summary:  Per H &P:  Schizoaffective disorder: There is no records Homecare everywhere from her recent hospitalizations in [place]. Patient has been started on Invega 9 mg by mouth daily at bedtime. Today I will increase it to 12 mg po q hs.  Will order invega sustenna 234 mg tomorrow.  Insomnia: sleeping well. Refused CPAPt.  Will continue trazodone 100 mg po qhs.   Hyperprolactinemia: will start amantadine 100mg  po bid.  Hypertension patient will be continued on Norvasc 10 mg a day and Lopressor  which has been increased to 50 mg po bid   Obstructive sleep apnea: Patient suffers from obstructive sleep apnea. Refused CPAP last night  Knee pain: continue Voltaren gel when necessary  Precautions every 15 minute checks  Hospitalization and status patient is currently voluntary.   Diet low sodium  Discharge disposition back home once stable  Discharge follow she will need follow-up in [place] : TSH and lipid panel within the normal limits. Hemoglobin A1c 6.3 and Prolactin >200.  Social worker will obtain collateral information from the patient's parents.  3/18: no changes to plan as written above. Invega 12 mg po q hs and invega sustenna 234 mg 3/18 already ordered.   Jim Like, MD   04/19/2015, 2:19 PM

## 2015-04-19 NOTE — Progress Notes (Signed)
D: Pt +ve AH- stated she deals with them the best she can. denies SI/HI/VH. Pt is pleasant and cooperative. Pt isolates to room, but pt did walk around nursing station for a little while this evening. Pt stated she does not come out her room much due to having conflicts with her peers.   A: Pt was offered support and encouragement. Pt was given scheduled medications. Pt was encourage to attend groups. Q 15 minute checks were done for safety.   R:Pt attends groups and interacts well with peers and staff. Pt is taking medication. Pt has no complaints at this time.Pt receptive to treatment and safety maintained on unit.

## 2015-04-20 MED ORDER — HALOPERIDOL 5 MG PO TABS
5.0000 mg | ORAL_TABLET | Freq: Three times a day (TID) | ORAL | Status: DC | PRN
  Administered 2015-04-20 – 2015-04-21 (×3): 5 mg via ORAL
  Filled 2015-04-20 (×3): qty 1

## 2015-04-20 MED ORDER — BENZTROPINE MESYLATE 0.5 MG PO TABS
0.5000 mg | ORAL_TABLET | Freq: Two times a day (BID) | ORAL | Status: DC | PRN
Start: 2015-04-20 — End: 2015-04-21

## 2015-04-20 MED ORDER — BENZTROPINE MESYLATE 1 MG/ML IJ SOLN
0.5000 mg | Freq: Two times a day (BID) | INTRAMUSCULAR | Status: DC | PRN
  Filled 2015-04-20: qty 0.5

## 2015-04-20 MED ORDER — HALOPERIDOL LACTATE 5 MG/ML IJ SOLN: 5.0000 mg | Freq: Four times a day (QID) | INTRAMUSCULAR | Status: DC | PRN

## 2015-04-20 NOTE — Plan of Care (Signed)
Problem: Diagnosis: Increased Risk For Suicide Attempt Goal: STG-Patient Will Comply With Medication Regime Outcome: Progressing Pt taking medications as prescribed.  Problem: Alteration in thought process Goal: LTG-Patient behavior demonstrates decreased signs psychosis (Patient behavior demonstrates decreased signs of psychosis to the point the patient is safe to return home and continue treatment in an outpatient setting.)  Outcome: Not Progressing Pt continues to verbalize auditory hallucinations "in another language."

## 2015-04-20 NOTE — BHH Group Notes (Signed)
[place] LCSW Group Therapy  04/20/2015 4:17 PM  Type of Therapy:  Group Therapy  Participation Level:  Did Not Attend  Modes of Intervention:  Discussion, Education, Socialization and Support  Summary of Progress/Problems: Self esteem: Patients discussed self esteem and how it impacts them. They discussed what aspects in their lives has influenced their self esteem. They were challenged to identify changes that are needed in order to improve self esteem.    [place] MSW, [place]  04/20/2015, 4:17 PM

## 2015-04-20 NOTE — Plan of Care (Signed)
Problem: Alteration in thought process Goal: LTG-Patient behavior demonstrates decreased signs psychosis (Patient behavior demonstrates decreased signs of psychosis to the point the patient is safe to return home and continue treatment in an outpatient setting.)  Outcome: Not Progressing Patient is positive for A Hallucinations.

## 2015-04-20 NOTE — BHH Group Notes (Signed)
[place] Group Notes:  (Nursing/MHT/Case Management/Adjunct)  Date:  04/20/2015  Time:  9:45 AM  Type of Therapy:  Community Meeting   Participation Level:  Did Not Attend  Weltha Cathy De'Chelle Wesleigh Markovic 04/20/2015, 9:45 AM

## 2015-04-20 NOTE — Progress Notes (Signed)
[facility] MD Progress Note  04/20/2015 11:37 AM Jenna Hill  MRN:  [id number]   Subjective: Today, the patient is lying in bed with lights off but sits up when addressed.She states that she is doing ok but continues to hear voices. She does not feel that these are any better.  She denies SI/HI   Slept well last night. Denies medication side effects, including EPS. Appetite is fine, planning to go to groups today.    Per nursing, she became more upset after rounds and felt the voices were getting progressively worse. Ordered prn to help with psychosis. .  Per nursing: D: Pt +ve AH- stated she deals with them the best she can. denies SI/HI/VH. Pt is pleasant and cooperative. Pt isolates to room, but pt did walk around nursing station for a little while this evening. Pt stated she does not come out her room much due to having conflicts with her peers.   A: Pt was offered support and encouragement. Pt was given scheduled medications. Pt was encourage to attend groups. Q 15 minute checks were done for safety.   R:Pt attends groups and interacts well with peers and staff. Pt is taking medication. Pt has no complaints at this time.Pt receptive to treatment and safety maintained on unit.         Principal Problem: Schizoaffective disorder, depressive type ([place]) Diagnosis:   Patient Active Problem List   Diagnosis Date Noted  . OSA (obstructive sleep apnea) [G47.33] 04/16/2015  . Obesity [E66.9] 04/16/2015  . Schizoaffective disorder, depressive type ([place]) [F25.1] 04/15/2015  . Hypertension [I10] 10/05/2012   Total Time spent with patient: 20 minutes   Past Psychiatric History: Patient reports being diagnosed with schizoaffective disorder back in 1990. Patient does report prior hospitalizations. She said the last 2 recent hospitalizations were in February at different facilities in [place]. She denies any history of self-injurious behaviors or suicidal attempts.    Past Medical  History: Patient reports suffering from hypertension. She reports having a seizure in 2004 after a concussion. She does not remember where she lost consciousness. She also had some surgeries on her right hand for assessed and on her right ankle for a fracture.   Family History: Denies any family history of mental illness, substance abuse or suicide  Social History: Patient states she lives by herself in an apartment in [place]. Does not have any income and has been out of work since 2009. She says she receives support from her parents live in [place]. 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 Patient says she has a bachelor's degree.  Patient is currently divorced. She was married for 9 years and has been divorced since 2014. She denies having any children.  Social History:  History  Alcohol Use  . 1.2 oz/week  . 2 Cans of beer per week     History  Drug Use No    Social History   Social History  . Marital Status: Married    Spouse Name: N/A  . Number of Children: N/A  . Years of Education: N/A   Social History Main Topics  . Smoking status: Never Smoker   . Smokeless tobacco: Never Used  . Alcohol Use: 1.2 oz/week    2 Cans of beer per week  . Drug Use: No  . Sexual Activity: Not Currently   Other Topics Concern  . None  Social History Narrative   Additional Social History:    Pain Medications: none reported Prescriptions: Metroprolol 50mg ; Norvasc 10mg ; Xanax; Haldol 40mg ; Seroquel 400 mg; Ambien10mg  Over the Counter: none reported History of alcohol / drug use?: No history of alcohol / drug abuse   Current Medications: Current Facility-Administered Medications  Medication Dose Route Frequency Provider Last Rate Last Dose  . acetaminophen (TYLENOL) tablet 650 mg  650 mg Oral Q6H PRN Hildred Priest, MD      . alum & mag hydroxide-simeth  (MAALOX/MYLANTA) 200-200-20 MG/5ML suspension 30 mL  30 mL Oral Q4H PRN Hildred Priest, MD   30 mL at 04/19/15 2106  . amantadine (SYMMETREL) capsule 100 mg  100 mg Oral BID Hildred Priest, MD   100 mg at 04/20/15 0831  . amLODipine (NORVASC) tablet 10 mg  10 mg Oral Daily Hildred Priest, MD   10 mg at 04/20/15 0831  . clonazePAM (KLONOPIN) tablet 0.5 mg  0.5 mg Oral TID Hildred Priest, MD   0.5 mg at 04/20/15 0831  . diclofenac sodium (VOLTAREN) 1 % transdermal gel 4 g  4 g Topical TID Hildred Priest, MD   4 g at 04/19/15 2200  . haloperidol (HALDOL) tablet 5 mg  5 mg Oral Q8H PRN Obrien Huskins L Gloriann Loan, MD   5 mg at 04/20/15 1135   Or  . haloperidol lactate (HALDOL) injection 5 mg  5 mg Intramuscular Q6H PRN Jacky Dross L Taniyah Ballow, MD      . ibuprofen (ADVIL,MOTRIN) tablet 600 mg  600 mg Oral Q6H PRN Hildred Priest, MD   600 mg at 04/18/15 0846  . magnesium hydroxide (MILK OF MAGNESIA) suspension 30 mL  30 mL Oral Daily PRN Hildred Priest, MD      . metoprolol tartrate (LOPRESSOR) tablet 50 mg  50 mg Oral BID Hildred Priest, MD   50 mg at 04/20/15 0831  . paliperidone (INVEGA) 24 hr tablet 12 mg  12 mg Oral QHS Hildred Priest, MD   12 mg at 04/19/15 2103  . traZODone (DESYREL) tablet 100 mg  100 mg Oral QHS Hildred Priest, MD   100 mg at 04/19/15 2104    Lab Results:  No results found for this or any previous visit (from the past 75 hour(s)).  Blood Alcohol level:  Lab Results  Component Value Date   ETH <5 04/14/2015    Physical Findings: AIMS: Facial and Oral Movements Muscles of Facial Expression: None, normal Lips and Perioral Area: None, normal Jaw: None, normal Tongue: None, normal,Extremity Movements Upper (arms, wrists, hands, fingers): None, normal Lower (legs, knees, ankles, toes): None, normal, Trunk Movements Neck, shoulders, hips: None, normal, Overall Severity Severity of  abnormal movements (highest score from questions above): None, normal Incapacitation due to abnormal movements: None, normal Patient's awareness of abnormal movements (rate only patient's report): No Awareness, Dental Status Current problems with teeth and/or dentures?: No Does patient usually wear dentures?: No  CIWA:  CIWA-Ar Total: 0 COWS:  COWS Total Score: 2  Musculoskeletal: Strength & Muscle Tone: within normal limits Gait & Station: normal Patient leans: N/A  Psychiatric Specialty Exam: Review of Systems  Constitutional: Negative.   HENT: Negative.   Eyes: Negative.   Respiratory: Negative.   Cardiovascular: Negative.   Gastrointestinal: Negative.   Genitourinary: Negative.   Musculoskeletal: Positive for joint pain.  Skin: Negative.   Neurological: Negative.   Endo/Heme/Allergies: Negative.   Psychiatric/Behavioral: Positive for hallucinations. The patient is nervous/anxious.     Blood pressure 127/91, pulse 93, temperature  98.7 F (37.1 C), temperature source Oral, resp. rate 20, height 5' (1.524 m), weight 104.781 kg (231 lb), last menstrual period 03/27/2015, SpO2 97 %.Body mass index is 45.11 kg/(m^2).  General Appearance: Fairly Groomed  Engineer, water::  Good  Speech:  Clear and Coherent  Volume:  Normal  Mood:  Anxious  Affect:  Constricted  Thought Process:  Tangential  Orientation:  Full (Time, Place, and Person)  Thought Content:  Hallucinations: Auditory  Suicidal Thoughts:  No  Homicidal Thoughts:  No  Memory:  Immediate;   Fair Recent;   Fair Remote;   Fair  Judgement:  Poor  Insight:  Lacking  Psychomotor Activity:  Decreased  Concentration:  Fair  Recall:  [employer] of Knowledge:Fair  Language: Fair  Akathisia:  No  Handed:    AIMS (if indicated):     Assets:  Armed forces logistics/support/administrative officer Physical Health  ADL's:  Intact  Cognition: WNL  Sleep:  Number of Hours: 8   Treatment Plan Summary:  Per H &P:  Schizoaffective disorder: There is no  records Homecare everywhere from her recent hospitalizations in [place]. Patient has been started on Invega 9 mg by mouth daily at bedtime. Today I will increase it to 12 mg po q hs.  Will order invega sustenna 234 mg tomorrow.  Insomnia: sleeping well. Refused CPAPt.  Will continue trazodone 100 mg po qhs.   Hyperprolactinemia: will start amantadine 100mg  po bid.  Hypertension patient will be continued on Norvasc 10 mg a day and Lopressor which has been increased to 50 mg po bid   Obstructive sleep apnea: Patient suffers from obstructive sleep apnea. Refused CPAP last night  Knee pain: continue Voltaren gel when necessary  Precautions every 15 minute checks  Hospitalization and status patient is currently voluntary.   Diet low sodium  Discharge disposition back home once stable  Discharge follow she will need follow-up in [place] : TSH and lipid panel within the normal limits. Hemoglobin A1c 6.3 and Prolactin >200.  Social worker will obtain collateral information from the patient's parents.  3/18: no changes to plan as written above. Invega 12 mg po q hs and invega sustenna 234 mg 3/18 already ordered.   3/19: Added Haldol 5mg  tid prn for break through hallucinations. Cogentin 0.5 mg BID prn for potential EPS. Ordered repeat EKG for tomorrow am to monitor QTc.     Jim Like, MD   04/20/2015, 11:37 AM

## 2015-04-20 NOTE — Progress Notes (Signed)
Patient is isolative in the room.Patient came to staff, stated that the voice was loud & bothering her.Called MD & haldol 5mg  give.She stated that it helped her some.Denies suicidal and homicidal ideations.Appetite good & energy level fair.

## 2015-04-21 DIAGNOSIS — Z5181 Encounter for therapeutic drug level monitoring: Secondary | ICD-10-CM

## 2015-04-21 LAB — GLUCOSE, CAPILLARY: GLUCOSE-CAPILLARY: 121 mg/dL — AB (ref 65–99)

## 2015-04-21 MED ORDER — TRAZODONE HCL 50 MG PO TABS
150.0000 mg | ORAL_TABLET | Freq: Every day | ORAL | Status: DC
  Administered 2015-04-21 – 2015-04-23 (×3): 150 mg via ORAL
  Filled 2015-04-21 (×3): qty 1

## 2015-04-21 MED ORDER — HALOPERIDOL 5 MG PO TABS
5.0000 mg | ORAL_TABLET | Freq: Every day | ORAL | Status: DC
  Administered 2015-04-22 – 2015-04-24 (×3): 5 mg via ORAL
  Filled 2015-04-21 (×3): qty 1

## 2015-04-21 NOTE — BHH Group Notes (Signed)
[facility] Group Notes:  (Nursing/MHT/Case Management/Adjunct)  Date:  04/21/2015  Time:  5:38 AM  Type of Therapy:  Group Therapy  Participation Level:  Did Not Attend   Summary of Progress/Problems:  Jenna Hill 04/21/2015, 5:38 AM

## 2015-04-21 NOTE — BHH Group Notes (Signed)
[place] LCSW Group Therapy   04/21/2015 1pm  Type of Therapy: Group Therapy   Participation Level: Did Not Attend. Patient invited to participate but declined.    [name]. Yitzchok Carriger, MSW, LCSWA, LCAS

## 2015-04-21 NOTE — Progress Notes (Signed)
[facility] MD Progress Note  04/21/2015 11:23 AM Jenna Hill  MRN:  [id number]   Subjective: Today, the patient is lying in bed with lights off but sits up when addressed.She states that she is doing ok but continues to hear voices. She does not feel that these are any better.  She denies SI/HI.  Patient says that the voices were aggravating her yesterday morning. She requested to be restarted on Haldol. Today she tells me the voices are not better and she feels very anxious and nervous. Then she started speaking in tongues back at the voices.  This weekend: Per nursing, she became more upset after rounds and felt the voices were getting progressively worse. Ordered prn to help with psychosis. .  Per nursing: D: Pt isolates in her room this evening, stating that being around other people "increases my anxiety." She denies SI/HI at this time. Pt continues to report AH "in a different language." Pt states that she is unsure what the voices are saying to her. She denies pain at this time. Pt c/o anxiety and requests PRN medication. A: Emotional support and encouragement provided. Medications administered with education. q15 minute safety checks maintained. R: Pt remains free from harm. Will continue to monitor.  Principal Problem: Schizoaffective disorder, depressive type ([place]) Diagnosis:   Patient Active Problem List   Diagnosis Date Noted  . OSA (obstructive sleep apnea) [G47.33] 04/16/2015  . Obesity [E66.9] 04/16/2015  . Schizoaffective disorder, depressive type ([place]) [F25.1] 04/15/2015  . Hypertension [I10] 10/05/2012   Total Time spent with patient: 30 minutes   Past Psychiatric History: Patient reports being diagnosed with schizoaffective disorder back in 1990. Patient does report prior hospitalizations. She said the last 2 recent hospitalizations were in February at different facilities in [place]. She denies any history of self-injurious behaviors or suicidal attempts.    Past  Medical History: Patient reports suffering from hypertension. She reports having a seizure in 2004 after a concussion. She does not remember where she lost consciousness. She also had some surgeries on her right hand for assessed and on her right ankle for a fracture.   Family History: Denies any family history of mental illness, substance abuse or suicide  Social History: Patient states she lives by herself in an apartment in [place]. Does not have any income and has been out of work since 2009. She says she receives support from her parents live in [place]. 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 Patient says she has a bachelor's degree.  Patient is currently divorced. She was married for 9 years and has been divorced since 2014. She denies having any children.  Social History:  History  Alcohol Use  . 1.2 oz/week  . 2 Cans of beer per week     History  Drug Use No    Social History   Social History  . Marital Status: Married    Spouse Name: N/A  . Number of Children: N/A  . Years of Education: N/A   Social History Main Topics  . Smoking status: Never Smoker   . Smokeless tobacco: Never Used  . Alcohol Use: 1.2 oz/week    2 Cans of beer per week  . Drug Use: No  . Sexual Activity: Not Currently   Other Topics Concern  . None   Social History Narrative   Additional Social History:    Pain  Medications: none reported Prescriptions: Metroprolol 50mg ; Norvasc 10mg ; Xanax; Haldol 40mg ; Seroquel 400 mg; Ambien10mg  Over the Counter: none reported History of alcohol / drug use?: No history of alcohol / drug abuse   Current Medications: Current Facility-Administered Medications  Medication Dose Route Frequency Provider Last Rate Last Dose  . acetaminophen (TYLENOL) tablet 650 mg  650 mg Oral Q6H PRN Hildred Priest, MD      . alum & mag hydroxide-simeth  (MAALOX/MYLANTA) 200-200-20 MG/5ML suspension 30 mL  30 mL Oral Q4H PRN Hildred Priest, MD   30 mL at 04/20/15 2227  . amantadine (SYMMETREL) capsule 100 mg  100 mg Oral BID Hildred Priest, MD   100 mg at 04/21/15 0819  . amLODipine (NORVASC) tablet 10 mg  10 mg Oral Daily Hildred Priest, MD   10 mg at 04/21/15 0819  . clonazePAM (KLONOPIN) tablet 0.5 mg  0.5 mg Oral TID Hildred Priest, MD   0.5 mg at 04/21/15 0819  . diclofenac sodium (VOLTAREN) 1 % transdermal gel 4 g  4 g Topical TID Hildred Priest, MD   4 g at 04/20/15 1502  . haloperidol (HALDOL) tablet 5 mg  5 mg Oral Daily Hildred Priest, MD      . ibuprofen (ADVIL,MOTRIN) tablet 600 mg  600 mg Oral Q6H PRN Hildred Priest, MD   600 mg at 04/18/15 0846  . magnesium hydroxide (MILK OF MAGNESIA) suspension 30 mL  30 mL Oral Daily PRN Hildred Priest, MD      . metoprolol tartrate (LOPRESSOR) tablet 50 mg  50 mg Oral BID Hildred Priest, MD   50 mg at 04/21/15 0819  . paliperidone (INVEGA) 24 hr tablet 12 mg  12 mg Oral QHS Hildred Priest, MD   12 mg at 04/20/15 2118  . traZODone (DESYREL) tablet 150 mg  150 mg Oral QHS Hildred Priest, MD        Lab Results:  No results found for this or any previous visit (from the past 48 hour(s)).  Blood Alcohol level:  Lab Results  Component Value Date   ETH <5 04/14/2015    Physical Findings: AIMS: Facial and Oral Movements Muscles of Facial Expression: None, normal Lips and Perioral Area: None, normal Jaw: None, normal Tongue: None, normal,Extremity Movements Upper (arms, wrists, hands, fingers): None, normal Lower (legs, knees, ankles, toes): None, normal, Trunk Movements Neck, shoulders, hips: None, normal, Overall Severity Severity of abnormal movements (highest score from questions above): None, normal Incapacitation due to abnormal movements: None, normal Patient's  awareness of abnormal movements (rate only patient's report): No Awareness, Dental Status Current problems with teeth and/or dentures?: No Does patient usually wear dentures?: No  CIWA:  CIWA-Ar Total: 0 COWS:  COWS Total Score: 2  Musculoskeletal: Strength & Muscle Tone: within normal limits Gait & Station: normal Patient leans: N/A  Psychiatric Specialty Exam: Review of Systems  Constitutional: Negative.   HENT: Negative.   Eyes: Negative.   Respiratory: Negative.   Cardiovascular: Negative.   Gastrointestinal: Negative.   Genitourinary: Negative.   Musculoskeletal: Negative for joint pain.  Skin: Negative.   Neurological: Negative.   Endo/Heme/Allergies: Negative.   Psychiatric/Behavioral: Positive for hallucinations. The patient is nervous/anxious and has insomnia.     Blood pressure 124/81, pulse 87, temperature 98.2 F (36.8 C), temperature source Oral, resp. rate 20, height 5' (1.524 m), weight 104.781 kg (231 lb), last menstrual period 03/27/2015, SpO2 97 %.Body mass index is 45.11 kg/(m^2).  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 Patient has been started on Invega.  Continue 12 mg po  qhs.  Received invega sustenna on 3/18 234 mg IM.  Still interacting to internal stimuli. Speaking in tongues during assessment.  Says she feels very anxious and nervous. She says that the hallucinations were aggravating her yesterday. I will add Haldol 5 mg by mouth daily.   Insomnia:  Refused CPAP. Will continue trazodone but dose will be increased to 150 mg  Hyperprolactinemia: continue amantadine 100mg  po bid.  Hypertension patient will be continued on Norvasc 10 mg a day and Lopressor which has been increased to 50 mg po bid.  BP well controlled  Obstructive sleep apnea: Patient suffers from obstructive sleep apnea. Refused CPAP   Knee pain: continue Voltaren gel when necessary  Precautions every 15 minute checks  Hospitalization and status patient is currently voluntary.   Diet low sodium  Discharge disposition back home once stable  Discharge follow she will need follow-up in [place] : TSH and lipid panel within the normal limits. Hemoglobin A1c 6.3 and Prolactin >200.  Social worker has obtained collateral information from the patient's parents. Family has been supporting her financially.  Patient may benefit from referral to [facility] ACT services.  Patient states her disability and Medicaid are currently pending. Says she has a hearing for disability at the end of this month.      Hildred Priest, MD   04/21/2015, 11:23 AM

## 2015-04-21 NOTE — BHH Group Notes (Signed)
[facility] LCSW Aftercare Discharge Planning Group Note  04/21/2015 9:30 AM  Participation Quality: Did Not Attend. Patient invited to participate but declined.   [name]. Sabastien Tyler, MSW, LCSWA, LCAS

## 2015-04-21 NOTE — Progress Notes (Signed)
Recreation Therapy Notes  Date: 03.20.17 Time: 3:00 pm Location: Craft Room  Group Topic: Self-expression  Goal Area(s) Addresses:  Patient will identify one color per emotion listed on wheel. Patient will verbalize benefit of using art as a means of self-expression. Patient will be educated on other forms of self-expression.  Behavioral Response: Did not attend  Intervention: Emotion Wheel  Activity: Patients were given Emotion Wheel worksheets and instructed to pick a color for each emotion and color in the section.  Education: LRT educated patients on different forms of self-expression.  Education Outcome: Patient did not attend group.  Clinical Observations/Feedback: Patient did not attend group.  Leonette Monarch, LRT/CTRS 04/21/2015 4:27 PM

## 2015-04-21 NOTE — Progress Notes (Signed)
D: Pt isolates in her room this evening, stating that being around other people "increases my anxiety." She denies SI/HI at this time. Pt continues to report AH "in a different language." Pt states that she is unsure what the voices are saying to her. She denies pain at this time. Pt c/o anxiety and requests PRN medication. A: Emotional support and encouragement provided. Medications administered with education. q15 minute safety checks maintained. R: Pt remains free from harm. Will continue to monitor.

## 2015-04-22 MED ORDER — PALIPERIDONE PALMITATE 156 MG/ML IM SUSP
156.0000 mg | Freq: Once | INTRAMUSCULAR | Status: AC
  Administered 2015-04-22: 156 mg via INTRAMUSCULAR
  Filled 2015-04-22: qty 1

## 2015-04-22 NOTE — Progress Notes (Signed)
D: At beginning of shift patient complained that her blood pressure feeling low. It was checked and in a normal range. Blood sugar was also checked and was in a normal ranged. Patient complained of HA. Denies SI/HI. States she's still having AH. States the voices are not specifically saying anything.  A: Medication was given with education. Encouragement was provided.  R: Patient was compliant with medication. She has remained calm and cooperative. Safety maintained with 15 min checks.

## 2015-04-22 NOTE — BHH Group Notes (Signed)
[place] LCSW Group Therapy   04/22/2015 11am  Type of Therapy: Group Therapy   Participation Level: Did Not Attend. Patient invited to participate but declined.    [name]. Xylan Sheils, MSW, LCSWA, LCAS

## 2015-04-22 NOTE — Plan of Care (Signed)
Problem: Diagnosis: Increased Risk For Suicide Attempt Goal: STG-Patient Will Attend All Groups On The Unit Outcome: Not Progressing Patient did not attend groups today

## 2015-04-22 NOTE — Tx Team (Addendum)
Interdisciplinary Treatment Plan Update (Adult)  Date:  04/22/2015 Time Reviewed:  4:19 PM  Progress in Treatment: Attending groups: Yes. intermittently  Participating in groups:  Yes. but disorganized thoughts Taking medication as prescribed:  Yes. Tolerating medication:  Yes. Family/Significant othe contact made:  Yes, individual(s) contacted:  patient's mother Patient understands diagnosis:  No. Discussing patient identified problems/goals with staff:  Yes. Medical problems stabilized or resolved:  Yes. Denies suicidal/homicidal ideation: Yes. Issues/concerns per patient self-inventory:  Yes. Other:  New problem(s) identified: No, Describe:  none reported  Discharge Plan or Barriers: Patient will stabilize and discharge home with outpatient mental health follow up.  Reason for Continuation of Hospitalization: Depression Hallucinations Medication stabilization Suicidal ideation  Comments:  Estimated length of stay: up to 3 days, expected discharge by Friday 04/25/15  New goal(s):  Review of initial/current patient goals per problem list:   1.  Goal(s): Participate in aftercare plan   Met:  No  Target date: by discharge  As evidenced by: patient will participate in aftercare plan AEB aftercare provider and housing plan identified at discharge 04/17/15: Patient wants to discharge home and follow up with [facility] in [place] but may be too disorganized to make a decision at this time.  04/22/15: Patient has not identified follow up yet but is interested in ACT referral. Patient will discharge home to her apartment in [place].   2.  Goal (s): Decrease depression   Met:  No  Target date: by discharge  As evidenced by: patient demonstrates decreased symptoms of depression and reports a Depression rating of 3 or less 04/17/15: Patient remains in room and is sad and anxious but reports no SI.  04/22/15: Patient denies SI but isolates to her room.   3.  Goal(s): Decrease  psychosis   Met:  No  Target date: by discharge  As evidenced by: patient demonstrates decreased symptoms of psychosis 04/17/15: patient responding to internal stimuli speaking to herself. Patient reports she is still hearing voices.  04/22/15: Patient still talking in tongues and responding to internal stimuli but has improved since admission.   Attendees: Physician:  Merlyn Albert, MD 3/21/20174:19 PM  Nursing:   Mayra Neer, RN 3/21/20174:19 PM  Other:  Carmell Austria, [place] 3/21/20174:19 PM  Other:  Everitt Amber, [place] 3/21/20174:19 PM  Other:   3/21/20174:19 PM  Other:  3/21/20174:19 PM  Other:  3/21/20174:19 PM  Other:  3/21/20174:19 PM  Other:  3/21/20174:19 PM  Other:  3/21/20174:19 PM  Other:  3/21/20174:19 PM  Other:   3/21/20174:19 PM   Scribe for Treatment Team:   Keene Breath, MSW, [place]  04/22/2015, 4:19 PM

## 2015-04-22 NOTE — Progress Notes (Signed)
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 She has stayed in the bed most of the day, will get up to eat, she denies Si at this time, but she states she does hear voices and they are in different languages, she states that she does not understand the voices but it is driving her crazy. Patient did have her invega  Injection today. Patient with q 15 min. Checks and no behaviors noted.

## 2015-04-22 NOTE — Progress Notes (Signed)
A&Ox3, isolated to room, denied pain, denied SI/HI, endorsed Auditory Hallucinations, "I heard them but I don't know what they are saying.." Sad, quiet, flat, no interactions with peers, pleasant but depressed. Will continue to monitor.

## 2015-04-22 NOTE — Progress Notes (Signed)
Recreation Therapy Notes  Date: 03.21.17 Time: 3:10 pm Location: Craft Room  Group Topic: Goal Setting  Goal Area(s) Addresses:  Patient will write down at least one goal. Patient will write down at least one obstacle.  Behavioral Response: Did not attend  Intervention: Recovery Goal Chart  Activity: Patients were instructed to make a Recovery Goal Chart including goals, obstacles, the date the started working on their goal, and the date they achieved their goal.  Education: LRT educated patients on healthy ways to celebrate reaching their goals.  Education Outcome: Patient did not attend group.   Clinical Observations/Feedback: Patient did not attend group.  Leonette Monarch, LRT/CTRS 04/22/2015 4:14 PM

## 2015-04-22 NOTE — Progress Notes (Signed)
[facility] MD Progress Note  04/22/2015 1:31 PM Jenna Hill  MRN:  [id number]   Subjective: Today, the patient is lying in bed with lights off but sits up when addressed.She states that she is doing ok but continues to hear voices. She says the voices are not as strong.  She denies SI/HI.  Yesterday she requested haldol as she said the voices were loud and argumentative.  This weekend: Per nursing, she became more upset after rounds and felt the voices were getting progressively worse. Ordered prn to help with psychosis. .  Per nursing:  D: At beginning of shift patient complained that her blood pressure feeling low. It was checked and in a normal range. Blood sugar was also checked and was in a normal ranged. Patient complained of HA. Denies SI/HI. States she's still having AH. States the voices are not specifically saying anything.  A: Medication was given with education. Encouragement was provided.  R: Patient was compliant with medication. She has remained calm and cooperative. Safety maintained with 15 min checks.   Principal Problem: Schizoaffective disorder, depressive type ([place]) Diagnosis:   Patient Active Problem List   Diagnosis Date Noted  . OSA (obstructive sleep apnea) [G47.33] 04/16/2015  . Obesity [E66.9] 04/16/2015  . Schizoaffective disorder, depressive type ([place]) [F25.1] 04/15/2015  . Hypertension [I10] 10/05/2012   Total Time spent with patient: 30 minutes   Past Psychiatric History: Patient reports being diagnosed with schizoaffective disorder back in 1990. Patient does report prior hospitalizations. She said the last 2 recent hospitalizations were in February at different facilities in [place]. She denies any history of self-injurious behaviors or suicidal attempts.    Past Medical History: Patient reports suffering from hypertension. She reports having a seizure in 2004 after a concussion. She does not remember where she lost consciousness. She also had  some surgeries on her right hand for assessed and on her right ankle for a fracture.   Family History: Denies any family history of mental illness, substance abuse or suicide  Social History: Patient states she lives by herself in an apartment in [place]. Does not have any income and has been out of work since 2009. She says she receives support from her parents live in [place]. 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 Patient says she has a bachelor's degree.  Patient is currently divorced. She was married for 9 years and has been divorced since 2014. She denies having any children.  Social History:  History  Alcohol Use  . 1.2 oz/week  . 2 Cans of beer per week     History  Drug Use No    Social History   Social History  . Marital Status: Married    Spouse Name: N/A  . Number of Children: N/A  . Years of Education: N/A   Social History Main Topics  . Smoking status: Never Smoker   . Smokeless tobacco: Never Used  . Alcohol Use: 1.2 oz/week    2 Cans of beer per week  . Drug Use: No  . Sexual Activity: Not Currently   Other Topics Concern  . None   Social History Narrative   Additional Social History:    Pain Medications: none reported Prescriptions: Metroprolol 50mg ; Norvasc 10mg ; Xanax; Haldol 40mg ; Seroquel 400 mg; Ambien10mg  Over the Counter: none reported History of alcohol / drug use?: No history of alcohol / drug  abuse   Current Medications: Current Facility-Administered Medications  Medication Dose Route Frequency Provider Last Rate Last Dose  . acetaminophen (TYLENOL) tablet 650 mg  650 mg Oral Q6H PRN Hildred Priest, MD      . alum & mag hydroxide-simeth (MAALOX/MYLANTA) 200-200-20 MG/5ML suspension 30 mL  30 mL Oral Q4H PRN Hildred Priest, MD   30 mL at 04/21/15 2121  . amantadine (SYMMETREL) capsule 100 mg  100 mg Oral BID  Hildred Priest, MD   100 mg at 04/22/15 [id number]  . amLODipine (NORVASC) tablet 10 mg  10 mg Oral Daily Hildred Priest, MD   10 mg at 04/22/15 [id number]  . clonazePAM (KLONOPIN) tablet 0.5 mg  0.5 mg Oral TID Hildred Priest, MD   0.5 mg at 04/22/15 0834  . diclofenac sodium (VOLTAREN) 1 % transdermal gel 4 g  4 g Topical TID Hildred Priest, MD   4 g at 04/21/15 1611  . haloperidol (HALDOL) tablet 5 mg  5 mg Oral Daily Hildred Priest, MD   5 mg at 04/22/15 0834  . ibuprofen (ADVIL,MOTRIN) tablet 600 mg  600 mg Oral Q6H PRN Hildred Priest, MD   600 mg at 04/22/15 0834  . magnesium hydroxide (MILK OF MAGNESIA) suspension 30 mL  30 mL Oral Daily PRN Hildred Priest, MD      . metoprolol tartrate (LOPRESSOR) tablet 50 mg  50 mg Oral BID Hildred Priest, MD   25 mg at 04/22/15 [id number]  . paliperidone (INVEGA SUSTENNA) injection 156 mg  156 mg Intramuscular Once Hildred Priest, MD      . paliperidone (INVEGA) 24 hr tablet 12 mg  12 mg Oral QHS Hildred Priest, MD   12 mg at 04/21/15 2119  . traZODone (DESYREL) tablet 150 mg  150 mg Oral QHS Hildred Priest, MD   150 mg at 04/21/15 2121    Lab Results:  Results for orders placed or performed during the hospital encounter of 04/15/15 (from the past 48 hour(s))  Glucose, capillary     Status: Abnormal   Collection Time: 04/21/15  7:37 PM  Result Value Ref Range   Glucose-Capillary 121 (H) 65 - 99 mg/dL   Comment 1 Notify RN     Blood Alcohol level:  Lab Results  Component Value Date   ETH <5 04/14/2015    Physical Findings: AIMS: Facial and Oral Movements Muscles of Facial Expression: None, normal Lips and Perioral Area: None, normal Jaw: None, normal Tongue: None, normal,Extremity Movements Upper (arms, wrists, hands, fingers): None, normal Lower (legs, knees, ankles, toes): None, normal, Trunk Movements Neck, shoulders, hips: None,  normal, Overall Severity Severity of abnormal movements (highest score from questions above): None, normal Incapacitation due to abnormal movements: None, normal Patient's awareness of abnormal movements (rate only patient's report): No Awareness, Dental Status Current problems with teeth and/or dentures?: No Does patient usually wear dentures?: No  CIWA:  CIWA-Ar Total: 0 COWS:  COWS Total Score: 2  Musculoskeletal: Strength & Muscle Tone: within normal limits Gait & Station: normal Patient leans: N/A  Psychiatric Specialty Exam: Review of Systems  Constitutional: Negative.   HENT: Negative.   Eyes: Negative.   Respiratory: Negative.   Cardiovascular: Negative.   Gastrointestinal: Negative.   Genitourinary: Negative.   Musculoskeletal: Negative for joint pain.  Skin: Negative.   Neurological: Negative.   Endo/Heme/Allergies: Negative.   Psychiatric/Behavioral: Positive for hallucinations. The patient is nervous/anxious and has insomnia.     Blood pressure 119/83, pulse 92, temperature 98.8 F (37.1 C), temperature  source Oral, resp. rate 20, height 5' (1.524 m), weight 104.781 kg (231 lb), last menstrual period 03/27/2015, SpO2 97 %.Body mass index is 45.11 kg/(m^2).  General Appearance: Fairly Groomed  Engineer, water::  Good  Speech:  Clear and Coherent  Volume:  Normal  Mood:  Anxious  Affect:  Constricted  Thought Process:  Tangential  Orientation:  Full (Time, Place, and Person)  Thought Content:  Hallucinations: Auditory  Suicidal Thoughts:  No  Homicidal Thoughts:  No  Memory:  Immediate;   Fair Recent;   Fair Remote;   Fair  Judgement:  Poor  Insight:  Lacking  Psychomotor Activity:  Decreased  Concentration:  Fair  Recall:  [employer] of Knowledge:Fair  Language: Fair  Akathisia:  No  Handed:    AIMS (if indicated):     Assets:  Armed forces logistics/support/administrative officer Physical Health  ADL's:  Intact  Cognition: WNL  Sleep:  Number of Hours: 8.5   Treatment Plan  Summary:  Schizoaffective disorder: There is no records from care everywhere from her recent hospitalizations in [place]. Patient has been started on Invega.  Continue 12 mg po qhs.  Received invega sustenna on 3/18 234 mg IM.  I will order Invega sustenna 156 mg IM today.   Still interacting to internal stimuli. Speaking in tongues during assessment yesterday.  Says she feels very anxious and nervous. Haldol 5 mg po q day was added.  Insomnia:  Refused CPAP. Will continue trazodone  150 mg  Hyperprolactinemia: continue amantadine 100mg  po bid.  Hypertension patient will be continued on Norvasc 10 mg a day and Lopressor which has been increased to 50 mg po bid.  BP well controlled  Obstructive sleep apnea: Patient suffers from obstructive sleep apnea. Refused CPAP   Knee pain: continue Voltaren gel when necessary  Precautions every 15 minute checks  Hospitalization and status patient is currently voluntary.   Diet low sodium  Discharge disposition back home once stable  Discharge follow she will need follow-up in [place] : TSH and lipid panel within the normal limits. Hemoglobin A1c 6.3 and Prolactin >200.  Social worker has obtained collateral information from the patient's parents. Family has been supporting her financially.  Patient may benefit from referral to [facility] ACT services.  Patient states her disability and Medicaid are currently pending. Says she has a hearing for disability at the end of this month.      Hildred Priest, MD   04/22/2015, 1:31 PM

## 2015-04-22 NOTE — Plan of Care (Addendum)
Problem: Ineffective individual coping Goal: STG: Patient will remain free from self harm Outcome: Progressing Medications administered as ordered by the physician, medications Therapeutic Effects, SEs and Adverse effects discussed, questions encouraged; Maalox PRN given for heartburn, 15 minute checks maintained for safety, clinical and moral support provided, patient encouraged to continue to express feelings and demonstrate safe care. Patient remain free from harm, will continue to monitor.

## 2015-04-23 MED ORDER — PALIPERIDONE ER 6 MG PO TB24: 12.0000 mg | ORAL_TABLET | Freq: Every day | ORAL | Status: DC

## 2015-04-23 MED ORDER — AMLODIPINE BESYLATE 10 MG PO TABS: 10.0000 mg | ORAL_TABLET | Freq: Every day | ORAL | Status: DC

## 2015-04-23 MED ORDER — DICLOFENAC SODIUM 1 % TD GEL: 4.0000 g | Freq: Three times a day (TID) | TRANSDERMAL | Status: DC | PRN

## 2015-04-23 MED ORDER — PALIPERIDONE PALMITATE 234 MG/1.5ML IM SUSP: 234.0000 mg | Freq: Once | INTRAMUSCULAR | Status: DC

## 2015-04-23 MED ORDER — METOPROLOL TARTRATE 50 MG PO TABS: 50.0000 mg | ORAL_TABLET | Freq: Two times a day (BID) | ORAL | Status: DC

## 2015-04-23 MED ORDER — CLONAZEPAM 0.5 MG PO TABS: 0.5000 mg | ORAL_TABLET | Freq: Three times a day (TID) | ORAL | Status: DC | PRN

## 2015-04-23 MED ORDER — EPINEPHRINE 0.3 MG/0.3ML IJ SOAJ: 0.3000 mg | Freq: Every day | INTRAMUSCULAR | Status: DC | PRN

## 2015-04-23 MED ORDER — AMANTADINE HCL 100 MG PO CAPS: 100.0000 mg | ORAL_CAPSULE | Freq: Two times a day (BID) | ORAL | Status: DC

## 2015-04-23 MED ORDER — TRAZODONE HCL 150 MG PO TABS: 150.0000 mg | ORAL_TABLET | Freq: Every evening | ORAL | Status: DC | PRN

## 2015-04-23 MED ORDER — HALOPERIDOL 5 MG PO TABS: 5.0000 mg | ORAL_TABLET | Freq: Every day | ORAL | Status: DC

## 2015-04-23 NOTE — Progress Notes (Signed)
D: Patient complaining of abdominal pain today due to her period.  Ibuprofen was given for discomfort.  Patient states she is still hearing voices, "I hear them, but I can't understand what they're saying.  It's mostly in [place] and I can understand some of it."  She denies SI/HI/VH.  She rates her depression as a 7+, hopelessness as a 2; anxiety as an 8+.  She is pleasant and cooperative.  She presents with flat, blunted affect and depressed mood. A: Continue to monitor medication management per MD orders.  Safety checks completed every 15 minutes per protocol.  Offer support and encouragement as needed. R: Patient is receptive to staff; her behavior is appropriate.

## 2015-04-23 NOTE — [facility] Group Notes (Signed)
[facility] Group Notes:  (Nursing/MHT/Case Management/Adjunct)  Date:  04/23/2015  Time:  1:26 AM  Type of Therapy:  Group Therapy  Participation Level:  Did Not Attend   Summary of Progress/Problems:  Jenna Hill 04/23/2015, 1:26 AM

## 2015-04-23 NOTE — BHH Group Notes (Signed)
[facility] Group Notes:  (Nursing/MHT/Case Management/Adjunct)  Date:  04/23/2015  Time:  9:48 PM  Type of Therapy:  Evening Wrap-up Group  Participation Level:  Did Not Attend  Participation Quality:  Did Not Attend  Affect:  N/A  Cognitive:  N/A  Insight:  None  Engagement in Group:  N/A  Modes of Intervention:  Discussion  Summary of Progress/Problems:  Levonne Spiller 04/23/2015, 9:48 PM

## 2015-04-23 NOTE — BHH Group Notes (Signed)
[facility] LCSW Group Therapy   04/23/2015 1pm  Type of Therapy: Group Therapy   Participation Level: Did Not Attend. Patient invited to participate but declined.    [name]. Synethia Endicott, MSW, LCSWA, LCAS

## 2015-04-23 NOTE — Progress Notes (Signed)
Recreation Therapy Notes  Date: 03.22.17 Time: 1:00 pm Location: Craft Room  Group Topic: Self-esteem  Goal Area(s) Addresses:  Patient will write at least one positive trait about self. Patient will verbalize benefit of having a healthy self-esteem.  Behavioral Response: Did not attend  Intervention: I Am  Activity: Patients were given a worksheet with the letter I on it and instructed to write as many positive traits about themselves inside the letter I.  Education: LRT educated patients on ways they can increase their self-esteem.  Education Outcome: Patient did not attend group.   Clinical Observations/Feedback: Patient did not attend group.  Leonette Monarch, LRT/CTRS 04/23/2015 2:59 PM

## 2015-04-23 NOTE — BHH Group Notes (Signed)
[facility] Group Notes:  (Nursing/MHT/Case Management/Adjunct)  Date:  04/23/2015  Time:  2:18 PM  Type of Therapy:  Psychoeducational Skills  Participation Level:  Did Not Attend  Adela Lank [facility] 04/23/2015, 2:18 PM

## 2015-04-23 NOTE — BHH Group Notes (Signed)
[facility] LCSW Aftercare Discharge Planning Group Note  04/23/2015 9:30 AM  Participation Quality: Did Not Attend. Patient invited to participate but declined.   [name]. Elianys Conry, MSW, LCSWA, LCAS

## 2015-04-23 NOTE — Progress Notes (Signed)
[facility] MD Progress Note  04/23/2015 9:47 AM Jenna Hill  MRN:  [id number]   Subjective: Patient says she continues to hear voices but cannot understanding what living which the voices are talking to her. She said the voices are happy and are not as bothersome as they were prior to admission. The patient is insistent on being discharged as soon as possible as she has a disability hearing early next week. She wants to return home and prepare her paperwork before that appointment. Denies SI, HI. Denies SE from medications.   Nurses report patient was saying the voices were talking in [place] to her.  Pt received invega sustenna 156 mg yesterday.  Per nursing: D: Patient complaining of abdominal pain today due to her period. Ibuprofen was given for discomfort. Patient states she is still hearing voices, "I hear them, but I can't understand what they're saying. It's mostly in [place] and I can understand some of it." She denies SI/HI/VH. She rates her depression as a 7+, hopelessness as a 2; anxiety as an 8+. She is pleasant and cooperative. She presents with flat, blunted affect and depressed mood. A: Continue to monitor medication management per MD orders. Safety checks completed every 15 minutes per protocol. Offer support and encouragement as needed. R: Patient is receptive to staff; her behavior is appropriate.   Principal Problem: Schizoaffective disorder, depressive type ([place]) Diagnosis:   Patient Active Problem List   Diagnosis Date Noted  . OSA (obstructive sleep apnea) [G47.33] 04/16/2015  . Obesity [E66.9] 04/16/2015  . Schizoaffective disorder, depressive type ([place]) [F25.1] 04/15/2015  . Hypertension [I10] 10/05/2012   Total Time spent with patient: 30 minutes   Past Psychiatric History: Patient reports being diagnosed with schizoaffective disorder back in 1990. Patient does report prior hospitalizations. She said the last 2 recent hospitalizations were in February  at different facilities in [place]. She denies any history of self-injurious behaviors or suicidal attempts.    Past Medical History: Patient reports suffering from hypertension. She reports having a seizure in 2004 after a concussion. She does not remember where she lost consciousness. She also had some surgeries on her right hand for assessed and on her right ankle for a fracture.   Family History: Denies any family history of mental illness, substance abuse or suicide  Social History: Patient states she lives by herself in an apartment in [place]. Does not have any income and has been out of work since 2009. She says she receives support from her parents live in [place]. 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 Patient says she has a bachelor's degree.  Patient is currently divorced. She was married for 9 years and has been divorced since 2014. She denies having any children.  Social History:  History  Alcohol Use  . 1.2 oz/week  . 2 Cans of beer per week     History  Drug Use No    Social History   Social History  . Marital Status: Married    Spouse Name: N/A  . Number of Children: N/A  . Years of Education: N/A   Social History Main Topics  . Smoking status: Never Smoker   . Smokeless tobacco: Never Used  . Alcohol Use: 1.2 oz/week    2 Cans of beer per week  . Drug Use: No  . Sexual Activity: Not Currently   Other Topics Concern  .  None   Social History Narrative   Additional Social History:    Pain Medications: none reported Prescriptions: Metroprolol 50mg ; Norvasc 10mg ; Xanax; Haldol 40mg ; Seroquel 400 mg; Ambien10mg  Over the Counter: none reported History of alcohol / drug use?: No history of alcohol / drug abuse   Current Medications: Current Facility-Administered Medications  Medication Dose Route Frequency Provider Last Rate Last Dose  .  acetaminophen (TYLENOL) tablet 650 mg  650 mg Oral Q6H PRN Hildred Priest, MD      . alum & mag hydroxide-simeth (MAALOX/MYLANTA) 200-200-20 MG/5ML suspension 30 mL  30 mL Oral Q4H PRN Hildred Priest, MD   30 mL at 04/22/15 2128  . amantadine (SYMMETREL) capsule 100 mg  100 mg Oral BID Hildred Priest, MD   100 mg at 04/23/15 0818  . amLODipine (NORVASC) tablet 10 mg  10 mg Oral Daily Hildred Priest, MD   10 mg at 04/23/15 0818  . clonazePAM (KLONOPIN) tablet 0.5 mg  0.5 mg Oral TID Hildred Priest, MD   0.5 mg at 04/23/15 0820  . diclofenac sodium (VOLTAREN) 1 % transdermal gel 4 g  4 g Topical TID Hildred Priest, MD   4 g at 04/23/15 [id number]  . haloperidol (HALDOL) tablet 5 mg  5 mg Oral Daily Hildred Priest, MD   5 mg at 04/23/15 0818  . ibuprofen (ADVIL,MOTRIN) tablet 600 mg  600 mg Oral Q6H PRN Hildred Priest, MD   600 mg at 04/23/15 0818  . magnesium hydroxide (MILK OF MAGNESIA) suspension 30 mL  30 mL Oral Daily PRN Hildred Priest, MD      . metoprolol tartrate (LOPRESSOR) tablet 50 mg  50 mg Oral BID Hildred Priest, MD   50 mg at 04/23/15 0818  . paliperidone (INVEGA) 24 hr tablet 12 mg  12 mg Oral QHS Hildred Priest, MD   12 mg at 04/22/15 2124  . traZODone (DESYREL) tablet 150 mg  150 mg Oral QHS Hildred Priest, MD   150 mg at 04/22/15 2124    Lab Results:  Results for orders placed or performed during the hospital encounter of 04/15/15 (from the past 48 hour(s))  Glucose, capillary     Status: Abnormal   Collection Time: 04/21/15  7:37 PM  Result Value Ref Range   Glucose-Capillary 121 (H) 65 - 99 mg/dL   Comment 1 Notify RN     Blood Alcohol level:  Lab Results  Component Value Date   ETH <5 04/14/2015    Physical Findings: AIMS: Facial and Oral Movements Muscles of Facial Expression: None, normal Lips and Perioral Area: None, normal Jaw: None,  normal Tongue: None, normal,Extremity Movements Upper (arms, wrists, hands, fingers): None, normal Lower (legs, knees, ankles, toes): None, normal, Trunk Movements Neck, shoulders, hips: None, normal, Overall Severity Severity of abnormal movements (highest score from questions above): None, normal Incapacitation due to abnormal movements: None, normal Patient's awareness of abnormal movements (rate only patient's report): No Awareness, Dental Status Current problems with teeth and/or dentures?: No Does patient usually wear dentures?: No  CIWA:  CIWA-Ar Total: 0 COWS:  COWS Total Score: 2  Musculoskeletal: Strength & Muscle Tone: within normal limits Gait & Station: normal Patient leans: N/A  Psychiatric Specialty Exam: Review of Systems  Constitutional: Negative.   HENT: Negative.   Eyes: Negative.   Respiratory: Negative.   Cardiovascular: Negative.   Gastrointestinal: Negative.   Genitourinary: Negative.   Musculoskeletal: Negative for joint pain.  Skin: Negative.   Neurological: Negative.   Endo/Heme/Allergies: Negative.  Psychiatric/Behavioral: Positive for hallucinations. The patient is nervous/anxious. The patient does not have insomnia.     Blood pressure 132/93, pulse 93, temperature 98.4 F (36.9 C), temperature source Oral, resp. rate 20, height 5' (1.524 m), weight 104.781 kg (231 lb), last menstrual period 03/27/2015, SpO2 100 %.Body mass index is 45.11 kg/(m^2).  General Appearance: Fairly Groomed  Engineer, water::  Good  Speech:  Clear and Coherent  Volume:  Normal  Mood:  Anxious  Affect:  Constricted  Thought Process:  Tangential  Orientation:  Full (Time, Place, and Person)  Thought Content:  Hallucinations: Auditory  Suicidal Thoughts:  No  Homicidal Thoughts:  No  Memory:  Immediate;   Fair Recent;   Fair Remote;   Fair  Judgement:  Poor  Insight:  Lacking  Psychomotor Activity:  Decreased  Concentration:  Fair  Recall:  [employer] of  Knowledge:Fair  Language: Fair  Akathisia:  No  Handed:    AIMS (if indicated):     Assets:  Armed forces logistics/support/administrative officer Physical Health  ADL's:  Intact  Cognition: WNL  Sleep:  Number of Hours: 8   Treatment Plan Summary:  Schizoaffective disorder: There is no records from care everywhere from her recent hospitalizations in [place]. Patient has been started on Invega.  Continue 12 mg po qhs.  Received invega sustenna on 3/18 234 mg IM.    She received  Invega sustenna 156 mg IM on 3/21  Still reporting auditory hallucinations. Speaking in tongues during assessment on Monday.  Says she feels very anxious and nervous. Haldol 5 mg po q day was added---pt seems to have responded well to adding haldol  Insomnia:  Refused CPAP. Will continue trazodone  150 mg  Hyperprolactinemia: continue amantadine 100mg  po bid.  Hypertension patient will be continued on Norvasc 10 mg a day and Lopressor which has been increased to 50 mg po bid.  BP well controlled  Obstructive sleep apnea: Patient suffers from obstructive sleep apnea. Refused CPAP   Knee pain: continue Voltaren gel when necessary  Precautions every 15 minute checks  Hospitalization and status patient is currently voluntary.   Diet low sodium  Discharge disposition back home once stable  Discharge follow she will need follow-up in [place] : TSH and lipid panel within the normal limits. Hemoglobin A1c 6.3 and Prolactin >200.  Social worker has obtained collateral information from the patient's parents. Family has been supporting her financially.  Patient may benefit from referral to [facility] services.  Patient states her disability and Medicaid are currently pending. Says she has a hearing for disability at the end of this month.    Plan to d/c home in the next 48 h. Will give her  14 days of free meds, plus the prescription for 30 days.  Parents in [place] have been contacted.  Referral for ACT has been made.      Hildred Priest, MD   04/23/2015, 9:47 AM

## 2015-04-23 NOTE — Plan of Care (Signed)
Problem: Ineffective individual coping Goal: STG: Patient will participate in after care plan Outcome: Progressing Patient has been discussing discharge for tomorrow.

## 2015-04-24 ENCOUNTER — Inpatient Hospital Stay: Payer: Medicaid Other

## 2015-04-24 MED ORDER — GADOBENATE DIMEGLUMINE 529 MG/ML IV SOLN
10.0000 mL | Freq: Once | INTRAVENOUS | Status: AC | PRN
  Administered 2015-04-24: 10 mL via INTRAVENOUS

## 2015-04-24 NOTE — Progress Notes (Signed)
D:Patient aware of discharge this shift . Patient returning home . Patient received all belonging locked up . Patient denies  Suicidal  And homicidal ideations  .  A: Writer instructed on discharge criteria  . Informed of 7 day supply  Of medication and prescriptions  given to patient . Aware  Of follow up appointment . R: Patient left unit with no questions  Or concerns  In cab to [employer]

## 2015-04-24 NOTE — Plan of Care (Signed)
Problem: Alteration in mood Goal: LTG-Patient reports reduction in suicidal thoughts (Patient reports reduction in suicidal thoughts and is able to verbalize a safety plan for whenever patient is feeling suicidal)  Outcome: Progressing Patient denies SI.      

## 2015-04-24 NOTE — BHH Group Notes (Signed)
[facility] LCSW Group Therapy   04/24/2015 11am  Type of Therapy: Group Therapy   Participation Level: Did Not Attend. Patient invited to participate but declined.    [name]. Aretta Stetzel, MSW, LCSWA, LCAS

## 2015-04-24 NOTE — [facility] Group Notes (Signed)
[facility] Group Notes:  (Nursing/MHT/Case Management/Adjunct)  Date:  04/24/2015  Time:  1:55 PM  Type of Therapy:  Group Therapy  Participation Level:  Did Not Attend  Ariea Rochin De'Chelle Rondell Pardon 04/24/2015, 1:55 PM

## 2015-04-24 NOTE — [facility] Suicide Risk Assessment (Signed)
[facility] Discharge Suicide Risk Assessment   Principal Problem: Schizoaffective disorder, depressive type [facility]) Discharge Diagnoses:  Patient Active Problem List   Diagnosis Date Noted  . OSA (obstructive sleep apnea) [G47.33] 04/16/2015  . Obesity [E66.9] 04/16/2015  . Schizoaffective disorder, depressive type ([place]) [F25.1] 04/15/2015  . Hypertension [I10] 10/05/2012      Psychiatric Specialty Exam: ROS  Blood pressure 127/87, pulse 93, temperature 98.5 F (36.9 C), temperature source Oral, resp. rate 20, height 5' (1.524 m), weight 104.781 kg (231 lb), last menstrual period 03/27/2015, SpO2 100 %.Body mass index is 45.11 kg/(m^2).                                                       Mental Status Per Nursing Assessment::   On Admission:     Demographic Factors:  Low socioeconomic status, Living alone and Unemployed  Loss Factors: Financial problems/change in socioeconomic status  Historical Factors: Impulsivity  Risk Reduction Factors:   Positive social support  Continued Clinical Symptoms:  Schizophrenia.  Past hospitalizations   Cognitive Features That Contribute To Risk:  None    Suicide Risk:  Minimal: No identifiable suicidal ideation.  Patients presenting with no risk factors but with morbid ruminations; may be classified as minimal risk based on the severity of the depressive symptoms  Follow-up Information    Follow up with [facility].   Contact information:   [address] [place], [place] Ph [phone] Fax [phone] (walk in hours M-F 8am-4pm)       Follow up with [facility] .   Contact information:   [id number] [place], [place] [address] Ph [phone] Fax [phone]        Hildred Priest, MD 04/24/2015, 9:24 AM
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
## 2015-04-24 NOTE — Progress Notes (Signed)
  [facility] Adult Case Management Discharge Plan :  Will you be returning to the same living situation after discharge:  Yes,  home to apartment At discharge, do you have transportation home?: No. cab voucher provider as patient is not organized enough to take the bus Do you have the ability to pay for your medications: Yes,  patient will follow up at [facility] and has financial support from her mother  Release of information consent forms completed and in the chart;  Patient's signature needed at discharge.  Patient to Follow up at: Follow-up Information    Follow up with [facility]. Go on 04/28/2015.   Why:  For follow-up care appt Monday 04/28/15 at 8am (walk in hours are M-F 8-4)   Contact information:   [place], [place] Ph [phone] Fax [phone] (walk in hours M-F 8am-4pm)       Follow up with [facility] .   Why:  Patient is referred to [facility] and will be seen week of 04/28/15   Contact information:   [id number] [place], [place] [address] Ph [phone] Fax [phone]       Follow up with [facility]. Go on 05/22/2015.   Why:  9 am to complete paperwork in order to stablish care there (for a primary care doctor).  They also have the application for the orange card that will have to be completed prior to April 20.    Contact information:   [address] [place] [phone]      Next level of care provider has access to [place] and Suicide Prevention discussed: Yes,  SPE discussed with patient and Hyacinth Meeker (mother) [phone]   Have you used any form of tobacco in the last 30 days? (Cigarettes, Smokeless Tobacco, Cigars, and/or Pipes): No  Has patient been referred to the Quitline?: N/A patient is not a smoker  Patient has been referred for addiction treatment: N/A  Keene Breath, MSW, LCSWA 04/24/2015, 4:55 PM  [phone]

## 2015-04-24 NOTE — Progress Notes (Signed)
D: Pt denies SI/HI/AVH. Pt is pleasant and cooperative. Affect flat and sad but brightens upon approach. No bizarre episode, not responding to internal stimuli.  Patient isolates to self. Patient appears less anxious and she is interacting with staff appropriately.  A: Pt was offered support and encouragement. Pt was given scheduled medications. Pt was encouraged to attend groups. Q 15 minute checks were done for safety.  R:Pt attends groups and interacts well with peers and staff. Pt is taking medication. Pt has no complaints.Pt receptive to treatment and safety maintained on unit.

## 2015-04-24 NOTE — Plan of Care (Signed)
Problem: Alteration in mood Goal: LTG-Pt's behavior demonstrates decreased signs of depression (Patient's behavior demonstrates decreased signs of depression to the point the patient is safe to return home and continue treatment in an outpatient setting)  Outcome: Progressing Patient voice of decrease depression , but continue to voice auditory hallucinations  Stated the voices aren't bad

## 2015-04-24 NOTE — Progress Notes (Signed)
Recreation Therapy Notes  Date: 03.23.17 Time: 3:00 pm Location: Craft Room  Group Topic: Leisure Education  Goal Area(s) Addresses:  Patient will identify activities for each letter of the alphabet. Patient will verbalize ability to integrate positive leisure into life post d/c. Patient will verbalize ability to use leisure as a Technical sales engineer.  Behavioral Response: Did not attend  Intervention: Leisure Alphabet  Activity: Patients were given a Leisure Air traffic controller and instructed to write a healthy leisure activity for each letter of the alphabet.  Education: LRT educated patients on what they need to participate in leisure.  Education Outcome: Patient did not attend group.   Clinical Observations/Feedback: Patient did not attend group.  Leonette [facility], LRT/CTRS 04/24/2015 4:17 PM

## 2015-04-25 ENCOUNTER — Encounter: Payer: Self-pay | Admitting: Psychiatry

## 2015-04-25 DIAGNOSIS — D352 Benign neoplasm of pituitary gland: Secondary | ICD-10-CM

## 2015-04-25 NOTE — Tx Team (Signed)
Interdisciplinary Treatment Plan Update (Adult)  Date:  04/25/2015 Time Reviewed:  11:33 AM  Progress in Treatment: Attending groups: Yes. intermittently  Participating in groups:  Yes. but disorganized thoughts Taking medication as prescribed:  Yes. Tolerating medication:  Yes. Family/Significant othe contact made:  Yes, individual(s) contacted:  patient's mother Patient understands diagnosis:  No. Discussing patient identified problems/goals with staff:  Yes. Medical problems stabilized or resolved:  Yes. Denies suicidal/homicidal ideation: Yes. Issues/concerns per patient self-inventory:  Yes. Other:  New problem(s) identified: No, Describe:  none reported  Discharge Plan or Barriers: Patient will stabilize and discharge home with outpatient mental health follow up.  Reason for Continuation of Hospitalization: Depression Hallucinations Medication stabilization Suicidal ideation  Comments:  Estimated length of stay: 0 days, will discharge today Thursday 04/24/15  New goal(s):  Review of initial/current patient goals per problem list:   1.  Goal(s): Participate in aftercare plan   Met:  Yes  Target date: by discharge  As evidenced by: patient will participate in aftercare plan AEB aftercare provider and housing plan identified at discharge 04/17/15: Patient wants to discharge home and follow up with [facility] in [place] but may be too disorganized to make a decision at this time.  04/22/15: Patient has not identified follow up yet but is interested in ACT referral. Patient will discharge home to her apartment in [place].  04/24/15: Patient will return home to her apartment and follow up with [facility] in [place] and is also referred to the [facility] in [place]. Goal met  2.  Goal (s): Decrease depression   Met:  Yes  Target date: by discharge  As evidenced by: patient demonstrates decreased symptoms of depression and reports a Depression rating of 3 or  less 04/17/15: Patient remains in room and is sad and anxious but reports no SI.  04/22/15: Patient denies SI but isolates to her room.  04/24/15: Patient stable for discharge per MD.  3.  Goal(s): Decrease psychosis   Met:  Yes  Target date: by discharge  As evidenced by: patient demonstrates decreased symptoms of psychosis 04/17/15: patient responding to internal stimuli speaking to herself. Patient reports she is still hearing voices.  04/22/15: Patient still talking in tongues and responding to internal stimuli but has improved since admission. 04/24/15: Patient stable for discharge per MD.    Attendees: Physician:  Merlyn Albert, MD 3/24/201711:33 AM  Nursing:   Polly Cobia, RN 3/24/201711:33 AM  Other:  Carmell Austria, [place] 3/24/201711:33 AM  Other:   3/24/201711:33 AM  Other:   3/24/201711:33 AM  Other:  3/24/201711:33 AM  Other:  3/24/201711:33 AM  Other:  3/24/201711:33 AM  Other:  3/24/201711:33 AM  Other:  3/24/201711:33 AM  Other:  3/24/201711:33 AM  Other:   3/24/201711:33 AM   Scribe for Treatment Team:   Keene Breath, MSW, [place]  04/25/2015, 11:33 AM

## 2015-05-17 ENCOUNTER — Encounter (HOSPITAL_COMMUNITY): Payer: Self-pay | Admitting: Emergency Medicine

## 2015-05-17 ENCOUNTER — Emergency Department (HOSPITAL_COMMUNITY)
Admission: EM | Admit: 2015-05-17 | Discharge: 2015-05-17 | Disposition: A | Discharge status: Home / Self Care | Payer: Medicaid Other | Attending: Emergency Medicine | Admitting: Emergency Medicine

## 2015-05-17 DIAGNOSIS — Z79899 Other long term (current) drug therapy: Secondary | ICD-10-CM | POA: Diagnosis not present

## 2015-05-17 DIAGNOSIS — M7989 Other specified soft tissue disorders: Secondary | ICD-10-CM | POA: Diagnosis not present

## 2015-05-17 DIAGNOSIS — M25562 Pain in left knee: Secondary | ICD-10-CM | POA: Insufficient documentation

## 2015-05-17 DIAGNOSIS — F209 Schizophrenia, unspecified: Secondary | ICD-10-CM | POA: Insufficient documentation

## 2015-05-17 DIAGNOSIS — Z91040 Latex allergy status: Secondary | ICD-10-CM | POA: Insufficient documentation

## 2015-05-17 DIAGNOSIS — I10 Essential (primary) hypertension: Secondary | ICD-10-CM | POA: Diagnosis not present

## 2015-05-17 DIAGNOSIS — M79671 Pain in right foot: Secondary | ICD-10-CM | POA: Insufficient documentation

## 2015-05-17 DIAGNOSIS — M79672 Pain in left foot: Secondary | ICD-10-CM | POA: Diagnosis present

## 2015-05-17 HISTORY — DX: Schizophrenia, unspecified: F20.9

## 2015-05-17 MED ORDER — IBUPROFEN 400 MG PO TABS
600.0000 mg | ORAL_TABLET | Freq: Once | ORAL | Status: AC
  Administered 2015-05-17: 600 mg via ORAL
  Filled 2015-05-17: qty 1

## 2015-05-17 NOTE — Discharge Instructions (Signed)
You were seen today for continued pain and swelling following injuries several weeks ago. You need to elevate your extremities and ice them. Wear good fitting shoes. Continue ibuprofen.  Edema Edema is an abnormal buildup of fluids in your bodytissues. Edema is somewhatdependent on gravity to pull the fluid to the lowest place in your body. That makes the condition more common in the legs and thighs (lower extremities). Painless swelling of the feet and ankles is common and becomes more likely as you get older. It is also common in looser tissues, like around your eyes.  When the affected area is squeezed, the fluid may move out of that spot and leave a dent for a few moments. This dent is called pitting.  CAUSES  There are many possible causes of edema. Eating too much salt and being on your feet or sitting for a long time can cause edema in your legs and ankles. Hot weather may make edema worse. Common medical causes of edema include:  Heart failure.  Liver disease.  Kidney disease.  Weak blood vessels in your legs.  Cancer.  An injury.  Pregnancy.  Some medications.  Obesity. SYMPTOMS  Edema is usually painless.Your skin may look swollen or shiny.  DIAGNOSIS  Your health care provider may be able to diagnose edema by asking about your medical history and doing a physical exam. You may need to have tests such as X-rays, an electrocardiogram, or blood tests to check for medical conditions that may cause edema.  TREATMENT  Edema treatment depends on the cause. If you have heart, liver, or kidney disease, you need the treatment appropriate for these conditions. General treatment may include:  Elevation of the affected body part above the level of your heart.  Compression of the affected body part. Pressure from elastic bandages or support stockings squeezes the tissues and forces fluid back into the blood vessels. This keeps fluid from entering the tissues.  Restriction of  fluid and salt intake.  Use of a water pill (diuretic). These medications are appropriate only for some types of edema. They pull fluid out of your body and make you urinate more often. This gets rid of fluid and reduces swelling, but diuretics can have side effects. Only use diuretics as directed by your health care provider. HOME CARE INSTRUCTIONS   Keep the affected body part above the level of your heart when you are lying down.   Do not sit still or stand for prolonged periods.   Do not put anything directly under your knees when lying down.  Do not wear constricting clothing or garters on your upper legs.   Exercise your legs to work the fluid back into your blood vessels. This may help the swelling go down.   Wear elastic bandages or support stockings to reduce ankle swelling as directed by your health care provider.   Eat a low-salt diet to reduce fluid if your health care provider recommends it.   Only take medicines as directed by your health care provider. SEEK MEDICAL CARE IF:   Your edema is not responding to treatment.  You have heart, liver, or kidney disease and notice symptoms of edema.  You have edema in your legs that does not improve after elevating them.   You have sudden and unexplained weight gain. SEEK IMMEDIATE MEDICAL CARE IF:   You develop shortness of breath or chest pain.   You cannot breathe when you lie down.  You develop pain, redness, or warmth in  the swollen areas.   You have heart, liver, or kidney disease and suddenly get edema.  You have a fever and your symptoms suddenly get worse. MAKE SURE YOU:   Understand these instructions.  Will watch your condition.  Will get help right away if you are not doing well or get worse.   This information is not intended to replace advice given to you by your health care provider. Make sure you discuss any questions you have with your health care provider.   Document Released:  01/18/2005 Document Revised: 02/08/2014 Document Reviewed: 11/10/2012 Elsevier Interactive Patient Education [employer].

## 2015-05-17 NOTE — ED Provider Notes (Signed)
CSN: [id number]     Arrival date & time 05/17/15  0155 History   First MD Initiated Contact with Patient 05/17/15 0435     Chief Complaint  Patient presents with  . Foot Pain  . Knee Pain     (Consider location/radiation/quality/duration/timing/severity/associated sxs/prior Treatment) HPI  This a 45 year old female who presents with bilateral foot and left knee pain. Onset worsening yesterday. She mostly reports increasing bilateral lower extremity edema that is causing her pain. She reports remote injury 3 weeks ago where she fell injuring both ankles in her left knee. She states "I don't like taking medication." She has not been elevating her extremities or using ice. Currently her pain is 8 out of 10. She states that she wore flip-flops today and is having a lot of pain between her great toe and her second toe. She denies any new injury.  Past Medical History  Diagnosis Date  . Hypertension   . Schizophrenia ([place])    History reviewed. No pertinent past surgical history. No family history on file. Social History  Substance Use Topics  . Smoking status: Never Smoker   . Smokeless tobacco: Never Used  . Alcohol Use: Yes   OB History    No data available     Review of Systems  Musculoskeletal:       Bilateral foot pain  Skin: Negative for color change and wound.  All other systems reviewed and are negative.     Allergies  Bee venom; Codeine; Other; Toradol; and Latex  Home Medications   Prior to Admission medications   Medication Sig Start Date End Date Taking? Authorizing Provider  amLODipine (NORVASC) 10 MG tablet Take 1 tablet (10 mg total) by mouth daily. 04/23/15  Yes Hildred Priest, MD  diphenhydrAMINE (BENADRYL) 25 MG tablet Take 25-50 mg by mouth every 6 (six) hours as needed for allergies.   Yes Historical Provider, MD  EPINEPHrine 0.3 mg/0.3 mL IJ SOAJ injection Inject 0.3 mLs (0.3 mg total) into the muscle daily as needed (allergic reaction).  04/23/15  Yes Hildred Priest, MD  ibuprofen (ADVIL,MOTRIN) 200 MG tablet Take 600 mg by mouth every 6 (six) hours as needed for moderate pain.   Yes Historical Provider, MD  metoprolol tartrate (LOPRESSOR) 50 MG tablet Take 1 tablet (50 mg total) by mouth 2 (two) times daily. 04/23/15  Yes Hildred Priest, MD  OLANZapine (ZYPREXA) 10 MG tablet Take 10 mg by mouth at bedtime.   Yes Historical Provider, MD  paliperidone (INVEGA SUSTENNA) 234 MG/1.5ML SUSP injection Inject 234 mg into the muscle once. 04/23/15  Yes Hildred Priest, MD  QUEtiapine (SEROQUEL) 200 MG tablet Take 400 mg by mouth 2 (two) times daily.   Yes Historical Provider, MD  traZODone (DESYREL) 150 MG tablet Take 1 tablet (150 mg total) by mouth at bedtime as needed for sleep. 04/23/15  Yes Hildred Priest, MD   BP 119/84 mmHg  Pulse 73  Temp(Src) 98.2 F (36.8 C) (Oral)  Resp 19  SpO2 97%  LMP 04/24/2015 Physical Exam  Constitutional: She is oriented to person, place, and time. She appears well-developed and well-nourished. No distress.  HENT:  Head: Normocephalic and atraumatic.  Cardiovascular: Normal rate and regular rhythm.   Pulmonary/Chest: Effort normal. No respiratory distress.  Musculoskeletal: She exhibits edema.  Trace to 1+ bilateral foot swelling to the ankle, normal range of motion bilateral ankles, 2+ DP pulse bilaterally, no obvious deformities  Neurological: She is alert and oriented to person, place, and time.  Skin: Skin is warm and dry.  No cracking of the skin noted especially between the toes, no overlying skin changes, redness, or erythema  Psychiatric: She has a normal mood and affect.  Nursing note and vitals reviewed.   ED Course  Procedures (including critical care time) Labs Review Labs Reviewed - No data to display  Imaging Review No results found. I have personally reviewed and evaluated these images and lab results as part of my medical  decision-making.   EKG Interpretation None      MDM   Final diagnoses:  Foot swelling    Patient presents with increasing foot pain and swelling. Remote injury several weeks ago. She did have x-rays at that time that were negative. She reports that she wore flip-flops for the first time and is having worsening pain. She is previously done well with well fitting shoes. Patient was given ibuprofen. Encouraged patient to continue rest, ice, compression, and elevation. No indication for further imaging at this time.  After history, exam, and medical workup I feel the patient has been appropriately medically screened and is safe for discharge home. Pertinent diagnoses were discussed with the patient. Patient was given return precautions.     Merryl Hacker, MD 05/17/15 [phone]

## 2015-05-17 NOTE — ED Notes (Signed)
Pt. reports bilateral foot pain and left knee pain onset yesterday denies injury , ambulatory .

## 2015-06-25 ENCOUNTER — Ambulatory Visit (HOSPITAL_COMMUNITY)
Admission: EM | Admit: 2015-06-25 | Discharge: 2015-06-25 | Disposition: A | Discharge status: Home / Self Care | Payer: Medicaid Other | Attending: Family Medicine | Admitting: Family Medicine

## 2015-06-25 ENCOUNTER — Encounter (HOSPITAL_COMMUNITY): Payer: Self-pay | Admitting: Emergency Medicine

## 2015-06-25 DIAGNOSIS — I10 Essential (primary) hypertension: Secondary | ICD-10-CM

## 2015-06-25 MED ORDER — HYDROCHLOROTHIAZIDE 25 MG PO TABS: 25.0000 mg | ORAL_TABLET | Freq: Every day | ORAL | Status: DC

## 2015-06-25 MED ORDER — METOPROLOL TARTRATE 50 MG PO TABS: 50.0000 mg | ORAL_TABLET | Freq: Two times a day (BID) | ORAL | Status: DC

## 2015-06-25 MED ORDER — AMLODIPINE BESYLATE 10 MG PO TABS: 10.0000 mg | ORAL_TABLET | Freq: Every day | ORAL | Status: DC

## 2015-06-25 NOTE — ED Provider Notes (Signed)
CSN: [id number]     Arrival date & time 06/25/15  1837 History   First MD Initiated Contact with Patient 06/25/15 1850     Chief Complaint  Patient presents with  . Medication Refill   (Consider location/radiation/quality/duration/timing/severity/associated sxs/prior Treatment) HPI Comments: 45 year old morbidly obese female with a history of hypertension and schizophrenia presents to the urgent care for refills of her blood pressure medication. She states she has been getting on-field in the emergency department for the past couple of months. She is apparently been following up with behavioral health and has not obtained a PCP. Her medications include Toprol 50 mg and amlodipine 10 mg. She states her last dose of medication was today. She denies chest pain, back pain, shortness of breath, abdominal pain, nausea or vomiting, dizziness, changes in vision, speech, hearing, swallowing, focal paresthesias or weakness, problems with balance, agitation, nervousness or recent history of head injury.   Past Medical History  Diagnosis Date  . Hypertension   . Schizophrenia ([place])    History reviewed. No pertinent past surgical history. History reviewed. No pertinent family history. Social History  Substance Use Topics  . Smoking status: Never Smoker   . Smokeless tobacco: Never Used  . Alcohol Use: Yes   OB History    No data available     Review of Systems  Constitutional: Negative.   Respiratory: Negative.   Cardiovascular: Negative.   Gastrointestinal: Negative.   Genitourinary: Negative.   Skin: Negative.   Neurological: Positive for headaches. Negative for dizziness, tremors, seizures, syncope, facial asymmetry, speech difficulty, weakness and numbness.       Patient states she developed a minor headache about 3:00 today.  Psychiatric/Behavioral: Negative for behavioral problems, confusion and agitation.  All other systems reviewed and are negative.   Allergies  Bee venom;  Codeine; Other; Toradol; and Latex  Home Medications   Prior to Admission medications   Medication Sig Start Date End Date Taking? Authorizing Provider  paliperidone (INVEGA SUSTENNA) 234 MG/1.5ML SUSP injection Inject 234 mg into the muscle once. 04/23/15  Yes Hildred Priest, MD  QUEtiapine (SEROQUEL) 200 MG tablet Take 400 mg by mouth 2 (two) times daily.   Yes Historical Provider, MD  amLODipine (NORVASC) 10 MG tablet Take 1 tablet (10 mg total) by mouth daily. 06/25/15   Janne Napoleon, NP  diphenhydrAMINE (BENADRYL) 25 MG tablet Take 25-50 mg by mouth every 6 (six) hours as needed for allergies.    Historical Provider, MD  EPINEPHrine 0.3 mg/0.3 mL IJ SOAJ injection Inject 0.3 mLs (0.3 mg total) into the muscle daily as needed (allergic reaction). 04/23/15   Hildred Priest, MD  hydrochlorothiazide (HYDRODIURIL) 25 MG tablet Take 1 tablet (25 mg total) by mouth daily. 06/25/15   Janne Napoleon, NP  ibuprofen (ADVIL,MOTRIN) 200 MG tablet Take 600 mg by mouth every 6 (six) hours as needed for moderate pain.    Historical Provider, MD  metoprolol (LOPRESSOR) 50 MG tablet Take 1 tablet (50 mg total) by mouth 2 (two) times daily. 06/25/15   Janne Napoleon, NP  OLANZapine (ZYPREXA) 10 MG tablet Take 10 mg by mouth at bedtime.    Historical Provider, MD  traZODone (DESYREL) 150 MG tablet Take 1 tablet (150 mg total) by mouth at bedtime as needed for sleep. 04/23/15   Hildred Priest, MD   Meds Ordered and Administered this Visit  Medications - No data to display  BP 196/123 mmHg  Pulse 121  Temp(Src) 98.3 F (36.8 C) (Oral)  Resp 18  SpO2 97%  LMP 06/04/2015 (Exact Date) No data found.   Physical Exam  Constitutional: She is oriented to person, place, and time. She appears well-developed and well-nourished. No distress.  HENT:  Head: Normocephalic and atraumatic.  Eyes: EOM are normal. Pupils are equal, round, and reactive to light.  Neck: Normal range of motion. Neck  supple.  Negative for carotid bruits  Cardiovascular: Regular rhythm and normal heart sounds.   Apical tachycardia at 112  Pulmonary/Chest: Effort normal and breath sounds normal. No respiratory distress. She has no wheezes. She has no rales.  Musculoskeletal: She exhibits no edema.  Lymphadenopathy:    She has no cervical adenopathy.  Neurological: She is alert and oriented to person, place, and time. She exhibits normal muscle tone.  Skin: Skin is warm and dry. No rash noted.  Psychiatric: She has a normal mood and affect. Her behavior is normal.  Nursing note and vitals reviewed.   ED Course  Procedures (including critical care time)  Labs Review Labs Reviewed - No data to display  Imaging Review No results found.   Visual Acuity Review  Right Eye Distance:   Left Eye Distance:   Bilateral Distance:    Right Eye Near:   Left Eye Near:    Bilateral Near:         MDM   1. Essential hypertension    You must obtain a primary care doctor as soon as possible. In these instructions are to telephone numbers that you should call today to assist in obtaining a doctor. Your blood pressure is out of control.  Asymptomatic today. Meds ordered this encounter  Medications  . metoprolol (LOPRESSOR) 50 MG tablet    Sig: Take 1 tablet (50 mg total) by mouth 2 (two) times daily.    Dispense:  60 tablet    Refill:  0    Order Specific Question:  Supervising Provider    Answer:  Billy Fischer [phone]  . amLODipine (NORVASC) 10 MG tablet    Sig: Take 1 tablet (10 mg total) by mouth daily.    Dispense:  30 tablet    Refill:  0    Order Specific Question:  Supervising Provider    Answer:  Billy Fischer [phone]  . hydrochlorothiazide (HYDRODIURIL) 25 MG tablet    Sig: Take 1 tablet (25 mg total) by mouth daily.    Dispense:  30 tablet    Refill:  0    Order Specific Question:  Supervising Provider    Answer:  Billy Fischer [[id number]]       Janne Napoleon, NP 06/25/15 1924

## 2015-06-25 NOTE — ED Notes (Signed)
The patient presented to the [facility] with a complaint of needing her HTN medication refilled. The patient stated that she went to her PCP and was not able to be seen until June.

## 2015-06-25 NOTE — Discharge Instructions (Signed)
You must obtain a primary care doctor as soon as possible. In these instructions are to telephone numbers that you should call today to assist in obtaining a doctor. Your blood pressure is out of control.  Hypertension Hypertension, commonly called high blood pressure, is when the force of blood pumping through your arteries is too strong. Your arteries are the blood vessels that carry blood from your heart throughout your body. A blood pressure reading consists of a higher number over a lower number, such as 110/72. The higher number (systolic) is the pressure inside your arteries when your heart pumps. The lower number (diastolic) is the pressure inside your arteries when your heart relaxes. Ideally you want your blood pressure below 120/80. Hypertension forces your heart to work harder to pump blood. Your arteries may become narrow or stiff. Having untreated or uncontrolled hypertension can cause heart attack, stroke, kidney disease, and other problems. RISK FACTORS Some risk factors for high blood pressure are controllable. Others are not.  Risk factors you cannot control include:   Race. You may be at higher risk if you are African American.  Age. Risk increases with age.  Gender. Men are at higher risk than women before age 63 years. After age 49, women are at higher risk than men. Risk factors you can control include:  Not getting enough exercise or physical activity.  Being overweight.  Getting too much fat, sugar, calories, or salt in your diet.  Drinking too much alcohol. SIGNS AND SYMPTOMS Hypertension does not usually cause signs or symptoms. Extremely high blood pressure (hypertensive crisis) may cause headache, anxiety, shortness of breath, and nosebleed. DIAGNOSIS To check if you have hypertension, your health care provider will measure your blood pressure while you are seated, with your arm held at the level of your heart. It should be measured at least twice using the same  arm. Certain conditions can cause a difference in blood pressure between your right and left arms. A blood pressure reading that is higher than normal on one occasion does not mean that you need treatment. If it is not clear whether you have high blood pressure, you may be asked to return on a different day to have your blood pressure checked again. Or, you may be asked to monitor your blood pressure at home for 1 or more weeks. TREATMENT Treating high blood pressure includes making lifestyle changes and possibly taking medicine. Living a healthy lifestyle can help lower high blood pressure. You may need to change some of your habits. Lifestyle changes may include:  Following the DASH diet. This diet is high in fruits, vegetables, and whole grains. It is low in salt, red meat, and added sugars.  Keep your sodium intake below 2,300 mg per day.  Getting at least 30-45 minutes of aerobic exercise at least 4 times per week.  Losing weight if necessary.  Not smoking.  Limiting alcoholic beverages.  Learning ways to reduce stress. Your health care provider may prescribe medicine if lifestyle changes are not enough to get your blood pressure under control, and if one of the following is true:  You are 51-64 years of age and your systolic blood pressure is above 140.  You are 23 years of age or older, and your systolic blood pressure is above 150.  Your diastolic blood pressure is above 90.  You have diabetes, and your systolic blood pressure is over [id number] or your diastolic blood pressure is over 90.  You have kidney disease and your  blood pressure is above 140/90.  You have heart disease and your blood pressure is above 140/90. Your personal target blood pressure may vary depending on your medical conditions, your age, and other factors. HOME CARE INSTRUCTIONS  Have your blood pressure rechecked as directed by your health care provider.   Take medicines only as directed by your health  care provider. Follow the directions carefully. Blood pressure medicines must be taken as prescribed. The medicine does not work as well when you skip doses. Skipping doses also puts you at risk for problems.  Do not smoke.   Monitor your blood pressure at home as directed by your health care provider. SEEK MEDICAL CARE IF:   You think you are having a reaction to medicines taken.  You have recurrent headaches or feel dizzy.  You have swelling in your ankles.  You have trouble with your vision. SEEK IMMEDIATE MEDICAL CARE IF:  You develop a severe headache or confusion.  You have unusual weakness, numbness, or feel faint.  You have severe chest or abdominal pain.  You vomit repeatedly.  You have trouble breathing. MAKE SURE YOU:   Understand these instructions.  Will watch your condition.  Will get help right away if you are not doing well or get worse.   This information is not intended to replace advice given to you by your health care provider. Make sure you discuss any questions you have with your health care provider.   Document Released: 01/18/2005 Document Revised: 06/04/2014 Document Reviewed: 11/10/2012 Elsevier Interactive Patient Education 2016 [place] Your High Blood Pressure Blood pressure is a measurement of how forceful your blood is pressing against the walls of the arteries. Arteries are muscular tubes within the circulatory system. Blood pressure does not stay the same. Blood pressure rises when you are active, excited, or nervous; and it lowers during sleep and relaxation. If the numbers measuring your blood pressure stay above normal most of the time, you are at risk for health problems. High blood pressure (hypertension) is a long-term (chronic) condition in which blood pressure is elevated. A blood pressure reading is recorded as two numbers, such as 120 over 80 (or 120/80). The first, higher number is called the systolic pressure. It  is a measure of the pressure in your arteries as the heart beats. The second, lower number is called the diastolic pressure. It is a measure of the pressure in your arteries as the heart relaxes between beats.  Keeping your blood pressure in a normal range is important to your overall health and prevention of health problems, such as heart disease and stroke. When your blood pressure is uncontrolled, your heart has to work harder than normal. High blood pressure is a very common condition in adults because blood pressure tends to rise with age. Men and women are equally likely to have hypertension but at different times in life. Before age 79, men are more likely to have hypertension. After 45 years of age, women are more likely to have it. Hypertension is especially common in African Americans. This condition often has no signs or symptoms. The cause of the condition is usually not known. Your caregiver can help you come up with a plan to keep your blood pressure in a normal, healthy range. BLOOD PRESSURE STAGES Blood pressure is classified into four stages: normal, prehypertension, stage 1, and stage 2. Your blood pressure reading will be used to determine what type of treatment, if any, is necessary. Appropriate treatment  options are tied to these four stages:  Normal  Systolic pressure (mm Hg): below 120.  Diastolic pressure (mm Hg): below 80. Prehypertension  Systolic pressure (mm Hg): 120 to 139.  Diastolic pressure (mm Hg): 80 to 89. Stage1  Systolic pressure (mm Hg): 140 to 159.  Diastolic pressure (mm Hg): 90 to 99. Stage2  Systolic pressure (mm Hg): 160 or above.  Diastolic pressure (mm Hg): 100 or above. RISKS RELATED TO HIGH BLOOD PRESSURE Managing your blood pressure is an important responsibility. Uncontrolled high blood pressure can lead to:  A heart attack.  A stroke.  A weakened blood vessel (aneurysm).  Heart failure.  Kidney damage.  Eye damage.  Metabolic  syndrome.  Memory and concentration problems. HOW TO MANAGE YOUR BLOOD PRESSURE Blood pressure can be managed effectively with lifestyle changes and medicines (if needed). Your caregiver will help you come up with a plan to bring your blood pressure within a normal range. Your plan should include the following: Education  Read all information provided by your caregivers about how to control blood pressure.  Educate yourself on the latest guidelines and treatment recommendations. New research is always being done to further define the risks and treatments for high blood pressure. Lifestylechanges  Control your weight.  Avoid smoking.  Stay physically active.  Reduce the amount of salt in your diet.  Reduce stress.  Control any chronic conditions, such as high cholesterol or diabetes.  Reduce your alcohol intake. Medicines  Several medicines (antihypertensive medicines) are available, if needed, to bring blood pressure within a normal range. Communication  Review all the medicines you take with your caregiver because there may be side effects or interactions.  Talk with your caregiver about your diet, exercise habits, and other lifestyle factors that may be contributing to high blood pressure.  See your caregiver regularly. Your caregiver can help you create and adjust your plan for managing high blood pressure. RECOMMENDATIONS FOR TREATMENT AND FOLLOW-UP  The following recommendations are based on current guidelines for managing high blood pressure in nonpregnant adults. Use these recommendations to identify the proper follow-up period or treatment option based on your blood pressure reading. You can discuss these options with your caregiver.  Systolic pressure of 123456 to [id number] or diastolic pressure of 80 to 89: Follow up with your caregiver as directed.  Systolic pressure of XX123456 to 0000000 or diastolic pressure of 90 to 100: Follow up with your caregiver within 2 months.  Systolic  pressure above 0000000 or diastolic pressure above [id number]: Follow up with your caregiver within 1 month.  Systolic pressure above [id number] or diastolic pressure above [id number]: Consider antihypertensive therapy; follow up with your caregiver within 1 week.  Systolic pressure above [id number] or diastolic pressure above 123456: Begin antihypertensive therapy; follow up with your caregiver within 1 week.   This information is not intended to replace advice given to you by your health care provider. Make sure you discuss any questions you have with your health care provider.   Document Released: 10/13/2011 Document Reviewed: 10/13/2011 Elsevier Interactive Patient Education [employer].

## 2015-06-26 ENCOUNTER — Emergency Department (HOSPITAL_COMMUNITY)
Admission: EM | Admit: 2015-06-26 | Discharge: 2015-06-26 | Discharge status: Against Medical Advice | Payer: Medicaid Other

## 2015-06-26 NOTE — ED Notes (Signed)
Pt states that she was seen at the Urgent Care today and had her BP meds refilled; pt states that she was told to see her Psychiatrist or [place] to have her other medications refilled; pt states she came here for her psych meds; during triage pt states that she wants a guarantee that we will fill her medications and that she doesn't want to wait; pt advised that it will be up to the physician if they choose to refill the medications or not and pt advised that she will have to wait to see the MD or PA; pt states that she is not going to wait; pt gets walks out of the triage room and leaves the ER prior to completing triage

## 2015-08-06 ENCOUNTER — Encounter (HOSPITAL_COMMUNITY): Payer: Self-pay | Admitting: *Deleted

## 2015-08-06 ENCOUNTER — Ambulatory Visit (HOSPITAL_COMMUNITY)
Admission: EM | Admit: 2015-08-06 | Discharge: 2015-08-06 | Disposition: A | Discharge status: Home / Self Care | Payer: Medicaid Other | Attending: Emergency Medicine | Admitting: Emergency Medicine

## 2015-08-06 DIAGNOSIS — I10 Essential (primary) hypertension: Secondary | ICD-10-CM

## 2015-08-06 MED ORDER — METOPROLOL TARTRATE 50 MG PO TABS: 50.0000 mg | ORAL_TABLET | Freq: Two times a day (BID) | ORAL | Status: DC

## 2015-08-06 MED ORDER — AMLODIPINE BESYLATE 10 MG PO TABS: 10.0000 mg | ORAL_TABLET | Freq: Every day | ORAL | Status: DC

## 2015-08-06 MED ORDER — HYDROCHLOROTHIAZIDE 25 MG PO TABS: 25.0000 mg | ORAL_TABLET | Freq: Every day | ORAL | Status: DC

## 2015-08-06 NOTE — ED Notes (Signed)
Pt  Is  Here  For refill of  Her Bp  meds     She  Reports  Has  Been out of  meds   For  A  Refill  Of    Her  bp  meds          Her bp  Is  Elevated      She   Is    In no  Acute  Severe   Distress

## 2015-08-06 NOTE — Discharge Instructions (Signed)
We refilled your blood pressure medication for 1 month. You need to call or go to social services to have them change your primary care provider if you are not able to see them within the next 1-2 months.    Hypertension Hypertension, commonly called high blood pressure, is when the force of blood pumping through your arteries is too strong. Your arteries are the blood vessels that carry blood from your heart throughout your body. A blood pressure reading consists of a higher number over a lower number, such as 110/72. The higher number (systolic) is the pressure inside your arteries when your heart pumps. The lower number (diastolic) is the pressure inside your arteries when your heart relaxes. Ideally you want your blood pressure below 120/80. Hypertension forces your heart to work harder to pump blood. Your arteries may become narrow or stiff. Having untreated or uncontrolled hypertension can cause heart attack, stroke, kidney disease, and other problems. RISK FACTORS Some risk factors for high blood pressure are controllable. Others are not.  Risk factors you cannot control include:   Race. You may be at higher risk if you are African American.  Age. Risk increases with age.  Gender. Men are at higher risk than women before age 78 years. After age 30, women are at higher risk than men. Risk factors you can control include:  Not getting enough exercise or physical activity.  Being overweight.  Getting too much fat, sugar, calories, or salt in your diet.  Drinking too much alcohol. SIGNS AND SYMPTOMS Hypertension does not usually cause signs or symptoms. Extremely high blood pressure (hypertensive crisis) may cause headache, anxiety, shortness of breath, and nosebleed. DIAGNOSIS To check if you have hypertension, your health care provider will measure your blood pressure while you are seated, with your arm held at the level of your heart. It should be measured at least twice using the  same arm. Certain conditions can cause a difference in blood pressure between your right and left arms. A blood pressure reading that is higher than normal on one occasion does not mean that you need treatment. If it is not clear whether you have high blood pressure, you may be asked to return on a different day to have your blood pressure checked again. Or, you may be asked to monitor your blood pressure at home for 1 or more weeks. TREATMENT Treating high blood pressure includes making lifestyle changes and possibly taking medicine. Living a healthy lifestyle can help lower high blood pressure. You may need to change some of your habits. Lifestyle changes may include:  Following the DASH diet. This diet is high in fruits, vegetables, and whole grains. It is low in salt, red meat, and added sugars.  Keep your sodium intake below 2,300 mg per day.  Getting at least 30-45 minutes of aerobic exercise at least 4 times per week.  Losing weight if necessary.  Not smoking.  Limiting alcoholic beverages.  Learning ways to reduce stress. Your health care provider may prescribe medicine if lifestyle changes are not enough to get your blood pressure under control, and if one of the following is true:  You are 46-7 years of age and your systolic blood pressure is above 140.  You are 2 years of age or older, and your systolic blood pressure is above 150.  Your diastolic blood pressure is above 90.  You have diabetes, and your systolic blood pressure is over [id number] or your diastolic blood pressure is over 90.  You  have kidney disease and your blood pressure is above 140/90.  You have heart disease and your blood pressure is above 140/90. Your personal target blood pressure may vary depending on your medical conditions, your age, and other factors. HOME CARE INSTRUCTIONS  Have your blood pressure rechecked as directed by your health care provider.   Take medicines only as directed by your  health care provider. Follow the directions carefully. Blood pressure medicines must be taken as prescribed. The medicine does not work as well when you skip doses. Skipping doses also puts you at risk for problems.  Do not smoke.   Monitor your blood pressure at home as directed by your health care provider. SEEK MEDICAL CARE IF:   You think you are having a reaction to medicines taken.  You have recurrent headaches or feel dizzy.  You have swelling in your ankles.  You have trouble with your vision. SEEK IMMEDIATE MEDICAL CARE IF:  You develop a severe headache or confusion.  You have unusual weakness, numbness, or feel faint.  You have severe chest or abdominal pain.  You vomit repeatedly.  You have trouble breathing. MAKE SURE YOU:   Understand these instructions.  Will watch your condition.  Will get help right away if you are not doing well or get worse.   This information is not intended to replace advice given to you by your health care provider. Make sure you discuss any questions you have with your health care provider.   Document Released: 01/18/2005 Document Revised: 06/04/2014 Document Reviewed: 11/10/2012 Elsevier Interactive Patient Education [employer].

## 2015-08-07 NOTE — ED Provider Notes (Signed)
CSN: [id number]     Arrival date & time 08/06/15  1423 History   First MD Initiated Contact with Patient 08/06/15 [phone]     Chief Complaint  Patient presents with  . Medication Refill   (Consider location/radiation/quality/duration/timing/severity/associated sxs/prior Treatment) HPI Comments: Patient presents for refill of her blood pressure medication. She was seen about 6 weeks ago here due to running out of her blood pressure medication. She was on Norvasc, Lopressor and HCTZ. She was having severe headache at the time and was told to be evaluated at ER which she went the next day. Blood pressure was only slightly elevated at time of ER visit after restarting her medications. Her blood pressure is very elevated today and she is beginning to have a headache again. She indicates that she is on Medicaid and she can not see her PCP who is listed on her card until October.   The history is provided by the patient.    Past Medical History  Diagnosis Date  . Hypertension   . Schizophrenia ([place])    History reviewed. No pertinent past surgical history. History reviewed. No pertinent family history. Social History  Substance Use Topics  . Smoking status: Never Smoker   . Smokeless tobacco: Never Used  . Alcohol Use: Yes   OB History    No data available     Review of Systems  Constitutional: Negative for fatigue.  Eyes: Negative for visual disturbance.  Respiratory: Negative for chest tightness and shortness of breath.   Cardiovascular: Negative for chest pain, palpitations and leg swelling.  Neurological: Positive for headaches. Negative for dizziness and weakness.  Psychiatric/Behavioral: The patient is nervous/anxious.     Allergies  Bee venom; Codeine; Other; Toradol; and Latex  Home Medications   Prior to Admission medications   Medication Sig Start Date End Date Taking? Authorizing Provider  amLODipine (NORVASC) 10 MG tablet Take 1 tablet (10 mg total) by mouth daily.  08/06/15   Katy Apo, NP  diphenhydrAMINE (BENADRYL) 25 MG tablet Take 25-50 mg by mouth every 6 (six) hours as needed for allergies.    Historical Provider, MD  EPINEPHrine 0.3 mg/0.3 mL IJ SOAJ injection Inject 0.3 mLs (0.3 mg total) into the muscle daily as needed (allergic reaction). 04/23/15   Hildred Priest, MD  hydrochlorothiazide (HYDRODIURIL) 25 MG tablet Take 1 tablet (25 mg total) by mouth daily. 08/06/15   Katy Apo, NP  ibuprofen (ADVIL,MOTRIN) 200 MG tablet Take 600 mg by mouth every 6 (six) hours as needed for moderate pain.    Historical Provider, MD  metoprolol (LOPRESSOR) 50 MG tablet Take 1 tablet (50 mg total) by mouth 2 (two) times daily. 08/06/15   Katy Apo, NP  OLANZapine (ZYPREXA) 10 MG tablet Take 10 mg by mouth at bedtime.    Historical Provider, MD  paliperidone (INVEGA SUSTENNA) 234 MG/1.5ML SUSP injection Inject 234 mg into the muscle once. 04/23/15   Hildred Priest, MD  QUEtiapine (SEROQUEL) 200 MG tablet Take 400 mg by mouth 2 (two) times daily.    Historical Provider, MD  traZODone (DESYREL) 150 MG tablet Take 1 tablet (150 mg total) by mouth at bedtime as needed for sleep. 04/23/15   Hildred Priest, MD   Meds Ordered and Administered this Visit  Medications - No data to display  BP 210/112 mmHg  Pulse 78  Temp(Src) 98.6 F (37 C) (Oral)  Resp 18  SpO2 100%  LMP 07/30/2015 (Approximate) No data found.   Physical Exam  Constitutional: She is oriented to person, place, and time. She appears well-developed and well-nourished.  Neck: Neck supple.  Cardiovascular: Normal rate, regular rhythm, normal heart sounds and normal pulses.   Pulmonary/Chest: Effort normal and breath sounds normal.  Neurological: She is alert and oriented to person, place, and time. She has normal strength. No cranial nerve deficit or sensory deficit.  Skin: Skin is warm and dry.  Psychiatric: Her speech is normal and behavior is normal.  Judgment and thought content normal. Her mood appears anxious.    ED Course  Procedures (including critical care time)  Labs Review Labs Reviewed - No data to display  Imaging Review No results found.   Visual Acuity Review  Right Eye Distance:   Left Eye Distance:   Bilateral Distance:    Right Eye Near:   Left Eye Near:    Bilateral Near:         MDM   1. Essential hypertension    Discussed with patient that we will be unable to keep refilling her blood pressure medication. She needs to be established with a PCP for blood pressure monitoring and possible labwork. Provided 30 day supply of Metoprolol twice a day, amlodipine once a day and HCTZ, even though she does not think she takes HCTZ any longer. Discussed that she needs to contact her social worker for Medicaid and ask to be switched to a different PCP if she is unable to see her current PCP for over 2 months. Provided number to call to find a PCP. Encouraged to go to ER if headache gets worse, vision changes occur or any signs of stroke occur. Patient understands. Recommend follow-up with a PCP as planned.     Katy Apo, NP 08/07/15 [phone]

## 2015-09-15 ENCOUNTER — Encounter (HOSPITAL_COMMUNITY): Payer: Self-pay | Admitting: Emergency Medicine

## 2015-09-15 ENCOUNTER — Emergency Department (HOSPITAL_COMMUNITY)
Admission: EM | Admit: 2015-09-15 | Discharge: 2015-09-15 | Disposition: A | Discharge status: Home / Self Care | Payer: Medicaid Other | Attending: Emergency Medicine | Admitting: Emergency Medicine

## 2015-09-15 DIAGNOSIS — M7989 Other specified soft tissue disorders: Secondary | ICD-10-CM | POA: Diagnosis not present

## 2015-09-15 DIAGNOSIS — M25571 Pain in right ankle and joints of right foot: Secondary | ICD-10-CM | POA: Diagnosis present

## 2015-09-15 DIAGNOSIS — Z79899 Other long term (current) drug therapy: Secondary | ICD-10-CM | POA: Diagnosis not present

## 2015-09-15 DIAGNOSIS — I10 Essential (primary) hypertension: Secondary | ICD-10-CM | POA: Diagnosis not present

## 2015-09-15 DIAGNOSIS — R600 Localized edema: Secondary | ICD-10-CM

## 2015-09-15 DIAGNOSIS — Z91040 Latex allergy status: Secondary | ICD-10-CM | POA: Insufficient documentation

## 2015-09-15 HISTORY — DX: Bipolar disorder, unspecified: F31.9

## 2015-09-15 MED ORDER — FUROSEMIDE 20 MG PO TABS: 20.0000 mg | ORAL_TABLET | Freq: Two times a day (BID) | ORAL | 0 refills | Status: DC

## 2015-09-15 MED ORDER — FUROSEMIDE 20 MG PO TABS
40.0000 mg | ORAL_TABLET | Freq: Once | ORAL | Status: AC
  Administered 2015-09-15: 40 mg via ORAL
  Filled 2015-09-15: qty 2

## 2015-09-15 MED ORDER — POTASSIUM CHLORIDE CRYS ER 20 MEQ PO TBCR
40.0000 meq | EXTENDED_RELEASE_TABLET | Freq: Once | ORAL | Status: AC
  Administered 2015-09-15: 40 meq via ORAL
  Filled 2015-09-15: qty 2

## 2015-09-15 MED ORDER — POTASSIUM CHLORIDE ER 20 MEQ PO TBCR: 20.0000 meq | EXTENDED_RELEASE_TABLET | Freq: Two times a day (BID) | ORAL | 0 refills | Status: DC

## 2015-09-15 NOTE — ED Provider Notes (Signed)
[facility] DEPT Provider Note   CSN: [id number] Arrival date & time: 09/15/15  [id number]  First Provider Contact:  None       History   Chief Complaint Chief Complaint  Patient presents with  . Ankle Pain    HPI Jenna Hill is a 45 y.o. female.  HPI   45 year old female with atraumatic lower extremity pain. Bilateral. Pain is worse in the feet and ankles and extends up her shins into her knees. Swelling. No numbness or tingling. No rash. No fevers or chills. Symptoms have been slowly progressing over the last several days. No respiratory complaints. No fevers or chills. Denies a past history of DVT/PE.  Past Medical History:  Diagnosis Date  . Bipolar 1 disorder ([place])   . Hypertension   . Schizophrenia [facility])     Patient Active Problem List   Diagnosis Date Noted  . 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 OSA (obstructive sleep apnea) 04/16/2015  . Obesity 04/16/2015  . Schizoaffective disorder, depressive type ([place]) 04/15/2015  . Hypertension 10/05/2012    History reviewed. No pertinent surgical history.  OB History    No data available       Home Medications    Prior to Admission medications   Medication Sig Start Date End Date Taking? Authorizing Provider  amLODipine (NORVASC) 10 MG tablet Take 1 tablet (10 mg total) by mouth daily. 08/06/15   Katy Apo, NP  diphenhydrAMINE (BENADRYL) 25 MG tablet Take 25-50 mg by mouth every 6 (six) hours as needed for allergies.    Historical Provider, MD  EPINEPHrine 0.3 mg/0.3 mL IJ SOAJ injection Inject 0.3 mLs (0.3 mg total) into the muscle daily as needed (allergic reaction). 04/23/15   Hildred Priest, MD  hydrochlorothiazide (HYDRODIURIL) 25 MG tablet Take 1 tablet (25 mg total) by mouth daily. 08/06/15   Katy Apo, NP  ibuprofen (ADVIL,MOTRIN) 200 MG tablet Take 600 mg by mouth every 6 (six) hours as needed for moderate pain.    Historical Provider, MD  metoprolol (LOPRESSOR) 50 MG  tablet Take 1 tablet (50 mg total) by mouth 2 (two) times daily. 08/06/15   Katy Apo, NP  OLANZapine (ZYPREXA) 10 MG tablet Take 10 mg by mouth at bedtime.    Historical Provider, MD  paliperidone (INVEGA SUSTENNA) 234 MG/1.5ML SUSP injection Inject 234 mg into the muscle once. 04/23/15   Hildred Priest, MD  QUEtiapine (SEROQUEL) 200 MG tablet Take 400 mg by mouth 2 (two) times daily.    Historical Provider, MD  traZODone (DESYREL) 150 MG tablet Take 1 tablet (150 mg total) by mouth at bedtime as needed for sleep. 04/23/15   Hildred Priest, MD    Family History No family history on file.  Social History Social History  Substance Use Topics  . Smoking status: Never Smoker  . Smokeless tobacco: Never Used  . Alcohol use Yes     Allergies   Bee venom; Codeine; Other; Toradol [ketorolac tromethamine]; and Latex   Review of Systems Review of Systems  All systems reviewed and negative, other than as noted in HPI.   Physical Exam Updated Vital Signs BP (!) 207/128 (BP Location: Right Arm)   Pulse 111   Temp 98.7 F (37.1 C) (Oral)   Resp 16   SpO2 100%   Physical Exam  Constitutional: She appears well-developed and well-nourished. No distress.  HENT:  Head: Normocephalic and atraumatic.  Eyes: Conjunctivae are normal.  Neck: Neck supple.  Cardiovascular: Normal rate and regular  rhythm.   No murmur heard. Pulmonary/Chest: Effort normal and breath sounds normal. No respiratory distress.  Abdominal: Soft. There is no tenderness.  Musculoskeletal:  Symmetric LE edema. No calf tenderness. No concerning skin changes. Negative Homan's. No palpable cords.   Neurological: She is alert.  Skin: Skin is warm and dry.  Psychiatric: She has a normal mood and affect.  Nursing note and vitals reviewed.    ED Treatments / Results  Labs (all labs ordered are listed, but only abnormal results are displayed) Labs Reviewed - No data to display  EKG  EKG  Interpretation None       Radiology No results found.  Procedures Procedures (including critical care time)  Medications Ordered in ED Medications - No data to display   Initial Impression / Assessment and Plan / ED Course  I have reviewed the triage vital signs and the nursing notes.  Pertinent labs & imaging results that were available during my care of the patient were reviewed by me and considered in my medical decision making (see chart for details).  Clinical Course    45yF with b/l LE swelling/pain. DVT considered, but doubt. Bilateral. Little in terms of readily identifiable risk factors. Mildly tachycardic, but denies CP/dyspnea. Good pulses in feet. I doubt PAD. Will give a few days of lasix. It has been determined that no acute conditions requiring further emergency intervention are present at this time. The patient has been advised of the diagnosis and plan. We have discussed signs and symptoms that warrant return to the ED and they are listed in the discharge instructions.    Vitals - 1 value per visit 09/15/2015 08/06/2015 06/26/2015 06/25/2015 05/17/2015  Pulse 111 78 96 121 70   Vitals - 1 value per visit 04/24/2015 04/16/2015 04/15/2015 04/11/2015  Pulse   91 95    Final Clinical Impressions(s) / ED Diagnoses   Final diagnoses:  Bilateral leg edema    New Prescriptions New Prescriptions   No medications on file     Virgel Manifold, MD 09/17/15 1007

## 2015-09-15 NOTE — ED Triage Notes (Signed)
Ankle pain and swelling x 3 days pt has hx of HTN denies injury,( pt talking to herself  In yiddish states is bipolar)

## 2015-09-15 NOTE — ED Notes (Signed)
MD at bedside. 

## 2016-01-22 ENCOUNTER — Emergency Department (HOSPITAL_COMMUNITY): Payer: Medicaid Other

## 2016-01-22 ENCOUNTER — Emergency Department (HOSPITAL_COMMUNITY)
Admission: EM | Admit: 2016-01-22 | Discharge: 2016-01-22 | Disposition: A | Discharge status: Home / Self Care | Payer: Medicaid Other | Attending: Emergency Medicine | Admitting: Emergency Medicine

## 2016-01-22 ENCOUNTER — Encounter (HOSPITAL_COMMUNITY): Payer: Self-pay

## 2016-01-22 DIAGNOSIS — Y9301 Activity, walking, marching and hiking: Secondary | ICD-10-CM | POA: Insufficient documentation

## 2016-01-22 DIAGNOSIS — S99912A Unspecified injury of left ankle, initial encounter: Secondary | ICD-10-CM | POA: Diagnosis present

## 2016-01-22 DIAGNOSIS — I10 Essential (primary) hypertension: Secondary | ICD-10-CM | POA: Diagnosis not present

## 2016-01-22 DIAGNOSIS — Y929 Unspecified place or not applicable: Secondary | ICD-10-CM | POA: Insufficient documentation

## 2016-01-22 DIAGNOSIS — X501XXA Overexertion from prolonged static or awkward postures, initial encounter: Secondary | ICD-10-CM | POA: Insufficient documentation

## 2016-01-22 DIAGNOSIS — S82842A Displaced bimalleolar fracture of left lower leg, initial encounter for closed fracture: Secondary | ICD-10-CM | POA: Diagnosis not present

## 2016-01-22 DIAGNOSIS — Y999 Unspecified external cause status: Secondary | ICD-10-CM | POA: Insufficient documentation

## 2016-01-22 DIAGNOSIS — Z91040 Latex allergy status: Secondary | ICD-10-CM | POA: Diagnosis not present

## 2016-01-22 MED ORDER — OXYCODONE-ACETAMINOPHEN 5-325 MG PO TABS: 1.0000 | ORAL_TABLET | Freq: Four times a day (QID) | ORAL | 0 refills | Status: DC | PRN

## 2016-01-22 MED ORDER — OXYCODONE-ACETAMINOPHEN 5-325 MG PO TABS
1.0000 | ORAL_TABLET | Freq: Once | ORAL | Status: AC
  Administered 2016-01-22: 1 via ORAL
  Filled 2016-01-22: qty 1

## 2016-01-22 MED ORDER — NAPROXEN 500 MG PO TABS: 500.0000 mg | ORAL_TABLET | Freq: Two times a day (BID) | ORAL | 0 refills | Status: DC

## 2016-01-22 NOTE — Discharge Instructions (Signed)
Please read and follow all provided instructions.  Your diagnoses today include:  1. Closed bimalleolar fracture of left ankle, initial encounter     Tests performed today include:  An x-ray and CT of the affected area - shows a broken ankle  Vital signs. See below for your results today.   Medications prescribed:   Percocet (oxycodone/acetaminophen) - narcotic pain medication  DO NOT drive or perform any activities that require you to be awake and alert because this medicine can make you drowsy. BE VERY CAREFUL not to take multiple medicines containing Tylenol (also called acetaminophen). Doing so can lead to an overdose which can damage your liver and cause liver failure and possibly death.   Naproxen - anti-inflammatory pain medication  Do not exceed 500mg  naproxen every 12 hours, take with food  You have been prescribed an anti-inflammatory medication or NSAID. Take with food. Take smallest effective dose for the shortest duration needed for your pain. Stop taking if you experience stomach pain or vomiting.   Take any prescribed medications only as directed.  Home care instructions:   Follow any educational materials contained in this packet  Follow R.I.C.E. Protocol:  R - rest your injury   I  - use ice on injury without applying directly to skin  C - compress injury with bandage or splint  E - elevate the injury as much as possible  Follow-up instructions: Please follow-up with the provided orthopedic physician (bone specialist) in 1 week. Call today or tomorrow for an appointment.   Return instructions:   Please return if your toes or feet are numb or tingling, appear gray or blue, or you have severe pain (also elevate the leg and loosen splint or wrap if you were given one)  Please return to the Emergency Department if you experience worsening symptoms.   Please return if you have any other emergent concerns.  Additional Information:  Your vital signs  today were: BP 122/77 (BP Location: Right Arm)    Pulse 99    Temp 97.6 F (36.4 C) (Oral)    Resp 16    Ht 5\' 5"  (1.651 m)    Wt 97.5 kg    LMP 01/07/2016    SpO2 94%    BMI 35.78 kg/m  If your blood pressure (BP) was elevated above 135/85 this visit, please have this repeated by your doctor within one month. --------------

## 2016-01-22 NOTE — ED Provider Notes (Signed)
[facility] DEPT Provider Note   CSN: [id number] Arrival date & time: 01/22/16  1005     History   Chief Complaint Chief Complaint  Patient presents with  . Fall  . Ankle Pain    HPI Jenna Hill is a 45 y.o. female.  Patient's presents from home with acute left ankle pain after she twisted her ankle walking up some stairs. Patient states that she heard a crack on her ankle twisted. She was unable to walk. She was able to call EMS for transport to the hospital. No treatments prior to arrival. Patient did not hit her head or sustain other injuries. She denies numbness or tingling. Onset of symptoms acute. Course is constant. Movement and palpation make the pain worse. Nothing makes it better.      Past Medical History:  Diagnosis Date  . Bipolar 1 disorder ([place])   . Hypertension   . Schizophrenia [facility])     Patient Active Problem List   Diagnosis Date Noted  . Pituitary microadenoma ([place]) 04/25/2015  . OSA (obstructive sleep apnea) 04/16/2015  . Obesity 04/16/2015  . Schizoaffective disorder, depressive type ([place]) 04/15/2015  . Hypertension 10/05/2012    Past Surgical History:  Procedure Laterality Date  . ANKLE ARTHROSCOPY Right   . CESAREAN SECTION    . HAND SURGERY Right     OB History    No data available       Home Medications    Prior to Admission medications   Medication Sig Start Date End Date Taking? Authorizing Provider  amLODipine (NORVASC) 10 MG tablet Take 1 tablet (10 mg total) by mouth daily. 08/06/15   Katy Apo, NP  diphenhydrAMINE (BENADRYL) 25 MG tablet Take 25-50 mg by mouth every 6 (six) hours as needed for allergies.    Historical Provider, MD  EPINEPHrine 0.3 mg/0.3 mL IJ SOAJ injection Inject 0.3 mLs (0.3 mg total) into the muscle daily as needed (allergic reaction). 04/23/15   Hildred Priest, MD  furosemide (LASIX) 20 MG tablet Take 1 tablet (20 mg total) by mouth 2 (two) times daily. 09/15/15   Virgel Manifold, MD    hydrochlorothiazide (HYDRODIURIL) 25 MG tablet Take 1 tablet (25 mg total) by mouth daily. 08/06/15   Katy Apo, NP  ibuprofen (ADVIL,MOTRIN) 200 MG tablet Take 600 mg by mouth every 6 (six) hours as needed for moderate pain.    Historical Provider, MD  metoprolol (LOPRESSOR) 50 MG tablet Take 1 tablet (50 mg total) by mouth 2 (two) times daily. 08/06/15   Katy Apo, NP  paliperidone (INVEGA SUSTENNA) 234 MG/1.5ML SUSP injection Inject 234 mg into the muscle once. Patient not taking: Reported on 09/15/2015 04/23/15   Hildred Priest, MD  potassium chloride 20 MEQ TBCR Take 20 mEq by mouth 2 (two) times daily. 09/15/15   Virgel Manifold, MD  QUEtiapine (SEROQUEL XR) 400 MG 24 hr tablet Take 400 mg by mouth 2 (two) times daily.    Historical Provider, MD  traZODone (DESYREL) 150 MG tablet Take 1 tablet (150 mg total) by mouth at bedtime as needed for sleep. Patient not taking: Reported on 09/15/2015 04/23/15   Hildred Priest, MD    Family History History reviewed. No pertinent family history.  Social History Social History  Substance Use Topics  . Smoking status: Never Smoker  . Smokeless tobacco: Never Used  . Alcohol use Yes     Comment: occ     Allergies   Bee venom; Codeine; Other; Toradol [ketorolac tromethamine];  and Latex   Review of Systems Review of Systems  Constitutional: Negative for activity change.  Musculoskeletal: Positive for arthralgias and gait problem. Negative for back pain, joint swelling and neck pain.  Skin: Negative for wound.  Neurological: Negative for weakness and numbness.     Physical Exam Updated Vital Signs BP 126/90 (BP Location: Right Arm)   Pulse 95   Temp 97.6 F (36.4 C) (Oral)   Resp 18   Ht 5\' 5"  (1.651 m)   Wt 97.5 kg   LMP 01/07/2016   SpO2 100%   BMI 35.78 kg/m   Physical Exam  Constitutional: She appears well-developed and well-nourished.  HENT:  Head: Normocephalic and atraumatic.  Eyes: Pupils  are equal, round, and reactive to light.  Neck: Normal range of motion. Neck supple.  Cardiovascular: Normal pulses.  Exam reveals no decreased pulses.   Pulses:      Dorsalis pedis pulses are 2+ on the left side.       Posterior tibial pulses are 2+ on the left side.  Musculoskeletal: She exhibits tenderness. She exhibits no edema.       Left hip: Normal.       Left knee: No tenderness found.       Left ankle: She exhibits decreased range of motion and swelling. Tenderness. Medial malleolus tenderness found. No lateral malleolus, no head of 5th metatarsal and no proximal fibula tenderness found.       Left lower leg: She exhibits tenderness. She exhibits no bony tenderness, no swelling and no deformity.       Left foot: There is normal range of motion, no tenderness and no bony tenderness.  Neurological: She is alert. No sensory deficit.  Motor, sensation, and vascular distal to the injury is fully intact.   Skin: Skin is warm and dry.  Psychiatric: She has a normal mood and affect.  Nursing note and vitals reviewed.    ED Treatments / Results   Radiology Dg Ankle Complete Left  Result Date: 01/22/2016 CLINICAL DATA:  Twisted ankle. EXAM: LEFT ANKLE COMPLETE - 3+ VIEW COMPARISON:  None FINDINGS: There is an oblique and comminuted fracture deformity involving the distal shaft of the fibula. Posterior malleolar fracture is also identified. Cannot rule out non medial malleolar fracture. IMPRESSION: 1. Fracture deformities involving the lateral and posterior malleoli identified. Cannot rule out nondisplaced medial malleolar fracture. Suggest further investigation with CT of the left ankle. Electronically Signed   By: Kerby Moors M.D.   On: 01/22/2016 10:51   Ct Ankle Left Wo Contrast  Result Date: 01/22/2016 CLINICAL DATA:  Left ankle fracture due to stumbling down stairs today. Pain. Initial encounter. EXAM: CT OF THE LEFT ANKLE WITHOUT CONTRAST TECHNIQUE: Multidetector CT imaging of  the left ankle was performed according to the standard protocol. Multiplanar CT image reconstructions were also generated. COMPARISON:  Plain films left ankle this same day. FINDINGS: Bones/Joint/Cartilage As seen on the comparison plain films, there is a mildly comminuted fracture of the posterior malleolus. Main fracture fragment measures 0.5 cm AP by 3.2 cm transverse at the plafond by 3 cm craniocaudal. This fragment is posteriorly displaced approximately 0.5 cm and superiorly displaced 0.3 cm. Several small fragments are identified interposed between the main fragment and adjacent tibia. The patient also has an oblique fracture of the distal fibula. The superior margin of the fracture is in the posterior cortex 6.5 cm above the tip of the lateral malleolus. The fracture extends in an anterior and inferior  orientation to a point 3 cm above the tip of the lateral malleolus. The distal fragment is posteriorly displaced 0.3 cm. No other fracture is identified. The distal fibular fragment is posteriorly displaced approximately 0.6 cm just above the plafond. Ligaments Suboptimally assessed by CT. Muscles and Tendons Intact. The tibialis posterior tendon passes adjacent to posterior malleolar fracture fragments. Otherwise unremarkable. Soft tissues Soft tissue swelling and hematoma are seen about the ankle. No tibiotalar joint effusion. IMPRESSION: Distal fibular and posterior malleolar fractures as described above. As described above, the distal fibular fragment is posteriorly displaced approximately 0.6 cm at and just above the plafond suggestive of syndesmotic injury and tear of the anterior, inferior tibiofibular ligament. The tibialis posterior tendon passes adjacent to tibial fracture fragments but no definite entrapment is seen. Electronically Signed   By: Inge Rise M.D.   On: 01/22/2016 12:38    Procedures Procedures (including critical care time)  Medications Ordered in ED Medications   oxyCODONE-acetaminophen (PERCOCET/ROXICET) 5-325 MG per tablet 1 tablet (1 tablet Oral Given 01/22/16 1114)     Initial Impression / Assessment and Plan / ED Course  I have reviewed the triage vital signs and the nursing notes.  Pertinent labs & imaging results that were available during my care of the patient were reviewed by me and considered in my medical decision making (see chart for details).  Clinical Course    Patient seen and examined. X-ray ordered.    Vital signs reviewed and are as follows: BP 126/90 (BP Location: Right Arm)   Pulse 95   Temp 97.6 F (36.4 C) (Oral)   Resp 18   Ht 5\' 5"  (1.651 m)   Wt 97.5 kg   LMP 01/07/2016   SpO2 100%   BMI 35.78 kg/m   11:20 AM Discussed x-rays with patient and Dr. Rogene Houston. Will proceed with CT to delineate between a bimalleolar and trimalleolar ankle fracture. Patient is tender medially.   CT finding as above. Ortho referral given. Pt placed in splint and crutches by ortho tech. Pain controlled and will be discharged.   Patient counseled on use of narcotic pain medications. Counseled not to combine these medications with others containing tylenol. Urged not to drink alcohol, drive, or perform any other activities that requires focus while taking these medications. The patient verbalizes understanding and agrees with the plan.  Encouraged patient to call ortho for an appointment today, return to the emergency department with any worsening or changing symptoms. Discussed rice protocol.   Final Clinical Impressions(s) / ED Diagnoses   Final diagnoses:  Closed bimalleolar fracture of left ankle, initial encounter   Patient with ankle fracture as above. Lower extremity is neurovascularly intact. No indication for emergent orthopedic consultation. Patient to follow-up for definitive fracture management.  New Prescriptions Discharge Medication List as of 01/22/2016  2:21 PM    START taking these medications   Details   naproxen (NAPROSYN) 500 MG tablet Take 1 tablet (500 mg total) by mouth 2 (two) times daily., Starting Thu 01/22/2016, Print    oxyCODONE-acetaminophen (PERCOCET/ROXICET) 5-325 MG tablet Take 1-2 tablets by mouth every 6 (six) hours as needed for severe pain., Starting Thu 01/22/2016, Print         Carlisle Cater, PA-C 01/22/16 1551    Fredia Sorrow, MD 01/22/16 [id number]

## 2016-01-22 NOTE — ED Notes (Signed)
Patient requesting a taxi voucher. SW in to see the patient.

## 2016-01-22 NOTE — ED Triage Notes (Signed)
Per EMS, Pt, from home, c/o L ankle pain and swelling after stumbling down a couple steps.  Pain score 10/10.  Swelling noted.  Pt reports "twisting" ankle and hearing a "crack."

## 2016-01-22 NOTE — ED Notes (Signed)
Bed: WA17 Expected date:  Expected time:  Means of arrival:  Comments: EMS Ankle pain

## 2016-01-24 ENCOUNTER — Encounter (HOSPITAL_COMMUNITY): Payer: Self-pay | Admitting: *Deleted

## 2016-01-24 ENCOUNTER — Emergency Department (HOSPITAL_COMMUNITY)
Admission: EM | Admit: 2016-01-24 | Discharge: 2016-01-24 | Disposition: A | Discharge status: Home / Self Care | Payer: Medicaid Other | Attending: Emergency Medicine | Admitting: Emergency Medicine

## 2016-01-24 DIAGNOSIS — Y929 Unspecified place or not applicable: Secondary | ICD-10-CM | POA: Insufficient documentation

## 2016-01-24 DIAGNOSIS — Y999 Unspecified external cause status: Secondary | ICD-10-CM | POA: Diagnosis not present

## 2016-01-24 DIAGNOSIS — Y939 Activity, unspecified: Secondary | ICD-10-CM | POA: Diagnosis not present

## 2016-01-24 DIAGNOSIS — I10 Essential (primary) hypertension: Secondary | ICD-10-CM | POA: Insufficient documentation

## 2016-01-24 DIAGNOSIS — Z91040 Latex allergy status: Secondary | ICD-10-CM | POA: Insufficient documentation

## 2016-01-24 DIAGNOSIS — M79672 Pain in left foot: Secondary | ICD-10-CM | POA: Insufficient documentation

## 2016-01-24 DIAGNOSIS — W010XXA Fall on same level from slipping, tripping and stumbling without subsequent striking against object, initial encounter: Secondary | ICD-10-CM | POA: Insufficient documentation

## 2016-01-24 MED ORDER — OXYCODONE-ACETAMINOPHEN 5-325 MG PO TABS
2.0000 | ORAL_TABLET | Freq: Once | ORAL | Status: AC
  Administered 2016-01-24: 2 via ORAL
  Filled 2016-01-24: qty 2

## 2016-01-24 NOTE — Discharge Instructions (Signed)
Please take your pain medicines as prescribed likely discuss. Please elevate her leg. If you have any numbness or tingling you can return to the ED.

## 2016-01-24 NOTE — ED Triage Notes (Signed)
PT states she was tx at [facility] on Thurs for a fracture to her L foot.  Today she was at home and she began having a panic attack (she was scared about being home alone on crutches) and she tripped and landed on her L leg.  States her L leg is now numb below L knee.  Cap refill less than 2 seconds.

## 2016-01-24 NOTE — ED Provider Notes (Signed)
[facility] DEPT Provider Note   CSN: [id number] Arrival date & time: 01/24/16  1155     History   Chief Complaint Chief Complaint  Patient presents with  . Fall    recent fracture  . Panic Attack    HPI Jenna Hill is a 45 y.o. female.  Jenna Hill is a 45 y.o. Female who presents to the ED complaining of her left leg feeling numb. Patient reports she tripped and fell injuring her left ankle 2 days ago. She was seen at the [facility] emergency department and diagnosed with a bimalleolar fracture. She was placed in a splint. Patient tells me today she became nervous on her crutches and noticed that her left leg is numb from her knee down to her toes. On arrival to the room her splint was removed and she tells me that her toes are numb and parts her ankle and foot are numb. She reports this sensation seems to be improving. She complains of pain to her left foot. She tells me she did not fill any of her pain medications because she was scared to take them. She did take some Tylenol earlier today. She notes she has not taken her blood pressure medicine today. She usually takes this in the evening. She is requesting some pain medication. She denies any new injury or trauma to her foot or ankle. No new falls. She denies fevers, chest pain, palpitations, shortness of breath, rashes or new injury to her extremities.   The history is provided by the patient and medical records. No language interpreter was used.  Fall  Pertinent negatives include no chest pain and no shortness of breath.    Past Medical History:  Diagnosis Date  . Bipolar 1 disorder ([place])   . Hypertension   . Schizophrenia [facility])     Patient Active Problem List   Diagnosis Date Noted  . 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 OSA (obstructive sleep apnea) 04/16/2015  . Obesity 04/16/2015  . Schizoaffective disorder, depressive type ([place]) 04/15/2015  . Hypertension 10/05/2012    Past Surgical History:    Procedure Laterality Date  . ANKLE ARTHROSCOPY Right   . CESAREAN SECTION    . HAND SURGERY Right     OB History    No data available       Home Medications    Prior to Admission medications   Medication Sig Start Date End Date Taking? Authorizing Provider  ABILIFY MAINTENA 400 MG PRSY Inject 400 mg into the skin every 30 (thirty) days. 12/18/15 12/16/15  Yes Historical Provider, MD  EPINEPHrine 0.3 mg/0.3 mL IJ SOAJ injection Inject 0.3 mLs (0.3 mg total) into the muscle daily as needed (allergic reaction). 04/23/15  Yes Hildred Priest, MD  ESZOPICLONE 3 MG tablet Take 3 mg by mouth at bedtime. 12/16/15  Yes Historical Provider, MD  QUEtiapine (SEROQUEL XR) 400 MG 24 hr tablet Take 400 mg by mouth 2 (two) times daily.   Yes Historical Provider, MD  TRINTELLIX 10 MG TABS Take 10 mg by mouth 2 (two) times daily.  12/19/15  Yes Historical Provider, MD  naproxen (NAPROSYN) 500 MG tablet Take 1 tablet (500 mg total) by mouth 2 (two) times daily. Patient not taking: Reported on 01/24/2016 01/22/16   Carlisle Cater, PA-C  oxyCODONE-acetaminophen (PERCOCET/ROXICET) 5-325 MG tablet Take 1-2 tablets by mouth every 6 (six) hours as needed for severe pain. Patient not taking: Reported on 01/24/2016 01/22/16   Carlisle Cater, PA-C    Family History  No family history on file.  Social History Social History  Substance Use Topics  . Smoking status: Never Smoker  . Smokeless tobacco: Never Used  . Alcohol use Yes     Comment: occ     Allergies   Bee venom; Bretylium; Codeine; Other; Toradol [ketorolac tromethamine]; and Latex   Review of Systems Review of Systems  Constitutional: Negative for fever.  Eyes: Negative for visual disturbance.  Respiratory: Negative for cough and shortness of breath.   Cardiovascular: Negative for chest pain and palpitations.  Skin: Negative for rash and wound.  Neurological: Positive for weakness and numbness.  Psychiatric/Behavioral: The  patient is nervous/anxious.      Physical Exam Updated Vital Signs BP 161/98 (BP Location: Right Arm)   Pulse 106   Temp 99.2 F (37.3 C) (Oral)   Resp 22   Ht 5\' 5"  (1.651 m)   Wt 97.5 kg   LMP 01/07/2016   SpO2 99%   BMI 35.78 kg/m   Physical Exam  Constitutional: She appears well-developed and well-nourished. No distress.  Nontoxic appearing. Appears anxious.  HENT:  Head: Normocephalic and atraumatic.  Eyes: Right eye exhibits no discharge. Left eye exhibits no discharge.  Cardiovascular: Regular rhythm and intact distal pulses.   Heart rate is 108. Bilateral dorsalis pedis and posterior tibialis pulses are intact. Left dorsalis pedis palpated and left posterior tibialis auscultated with Doppler due to tenderness to this area.  Pulmonary/Chest: Effort normal. No respiratory distress.  Abdominal: Soft. There is no tenderness.  Musculoskeletal: Normal range of motion. She exhibits edema and tenderness. She exhibits no deformity.  Mild left ankle edema. No deformity noted. There is tenderness to the lateral and medial aspect of her left ankle. Mild left pedal edema. No calf edema or tenderness bilaterally. Leg compartments feel soft. Good capillary refill to her bilateral distal toes.  Neurological: She is alert. Coordination normal.  Patient reports she has no sensation to all of her left distal toes. She is unable to wiggle her left toes. Sensation is intact to foot and ankle as she winces in pain with palpation. She does not react to touching her toes.   Skin: Skin is warm and dry. Capillary refill takes less than 2 seconds. No rash noted. She is not diaphoretic. No erythema. No pallor.  Psychiatric: Her behavior is normal. Her mood appears anxious.  Patient appears anxious.  Nursing note and vitals reviewed.    ED Treatments / Results  Labs (all labs ordered are listed, but only abnormal results are displayed) Labs Reviewed - No data to display  EKG  EKG  Interpretation None       Radiology No results found.  Procedures Procedures (including critical care time)  Medications Ordered in ED Medications  oxyCODONE-acetaminophen (PERCOCET/ROXICET) 5-325 MG per tablet 2 tablet (2 tablets Oral Given 01/24/16 1824)     Initial Impression / Assessment and Plan / ED Course  I have reviewed the triage vital signs and the nursing notes.  Pertinent labs & imaging results that were available during my care of the patient were reviewed by me and considered in my medical decision making (see chart for details).  Clinical Course    This  is a 45 y.o. Female who presents to the ED complaining of her left leg feeling numb. Patient reports she tripped and fell injuring her left ankle 2 days ago. She was seen at the [facility] emergency department and diagnosed with a bimalleolar fracture. She was placed in a  splint. Patient tells me today she became nervous on her crutches and noticed that her left leg is numb from her knee down to her toes. On arrival to the room her splint was removed and she tells me that her toes are numb and parts her ankle and foot are numb. She reports this sensation seems to be improving. She complains of pain to her left foot. She tells me she did not fill any of her pain medications because she was scared to take them. She did take some Tylenol earlier today. She notes she has not taken her blood pressure medicine today. She usually takes this in the evening. She is requesting some pain medication. She denies any new injury or trauma to her foot or ankle. No new falls. She denies fevers, chest pain, palpitations, shortness of breath. On exam the patient is afebrile nontoxic appearing. Splint was removed and she has some expected swelling to her ankle and foot. Leg compartments feel soft. She has good capillary refill. Good pulses. Initially patient would not wiggle her toes and reported them to be numb. She does want some pain with  palpation of her foot and ankle. I suspect sensation is intact to these parts.  I provided the patient with some Percocet and I re-evaluation patient reports she is feeling much better. She is wiggling her toes. Sensation is intact to her toes. She has good capillary refill. Sensation is intact. Patient splint was reapplied and at reexamination patient still has good capillary refill and good movement of her toes. I encouraged her to keep her leg elevated to reduce swelling. She denies chest pain or SOB.  No calf edema or swelling. I am not concerned for PE or DVT at this time. She only has expected swelling at her ankle and foot. I am also not concerned about compartment syndrome.  I discussed using pain medication other than narcotics. She is provided with a prescription for naproxen and I encouraged her to take this twice a day with food on her stomach and only take Percocet for breakthrough pain. Patient is happy with this advice and agrees to this plan. I discussed symptoms that would require her to return to the emergency department. I discussed cast and splint care. I also encouraged her to follow-up with orthopedic surgery as discussed in her first emergency department visit. I also encouraged her to go home and take her blood pressure medicine. Patient agrees with plan. I advised the patient to follow-up with their primary care provider this week. I advised the patient to return to the emergency department with new or worsening symptoms or new concerns. The patient verbalized understanding and agreement with plan.    This patient was discussed with Dr. Leonette Monarch who agrees with assessment and plan.     Final Clinical Impressions(s) / ED Diagnoses   Final diagnoses:  Left foot pain    New Prescriptions Discharge Medication List as of 01/24/2016  9:50 PM       Waynetta Pean, PA-C 01/25/16 0138    Fatima Blank, MD 01/25/16 [phone]

## 2016-01-24 NOTE — Progress Notes (Signed)
Orthopedic Tech Progress Note Patient Details:  Jenna Hill 06/15/1970 [id number]  Ortho Devices Type of Ortho Device: Ace wrap, Post (short leg) splint, Stirrup splint Ortho Device/Splint Interventions: Application   [name] 01/24/2016, 9:32 PM

## 2016-01-24 NOTE — ED Notes (Signed)
ED Provider at bedside. 

## 2016-01-28 ENCOUNTER — Other Ambulatory Visit: Payer: Self-pay | Admitting: Orthopedic Surgery

## 2016-01-29 ENCOUNTER — Encounter (HOSPITAL_BASED_OUTPATIENT_CLINIC_OR_DEPARTMENT_OTHER)
Admission: RE | Disposition: A | Discharge status: Home / Self Care | Payer: Self-pay | Source: Ambulatory Visit | Attending: Orthopedic Surgery

## 2016-01-29 ENCOUNTER — Encounter (HOSPITAL_BASED_OUTPATIENT_CLINIC_OR_DEPARTMENT_OTHER): Payer: Self-pay | Admitting: Anesthesiology

## 2016-01-29 ENCOUNTER — Ambulatory Visit (HOSPITAL_BASED_OUTPATIENT_CLINIC_OR_DEPARTMENT_OTHER): Payer: Medicaid Other | Admitting: Anesthesiology

## 2016-01-29 ENCOUNTER — Ambulatory Visit (HOSPITAL_BASED_OUTPATIENT_CLINIC_OR_DEPARTMENT_OTHER)
Admission: RE | Admit: 2016-01-29 | Discharge: 2016-01-29 | Disposition: A | Discharge status: Home / Self Care | Payer: Medicaid Other | Source: Ambulatory Visit | Attending: Orthopedic Surgery | Admitting: Orthopedic Surgery

## 2016-01-29 DIAGNOSIS — I10 Essential (primary) hypertension: Secondary | ICD-10-CM | POA: Insufficient documentation

## 2016-01-29 DIAGNOSIS — Z885 Allergy status to narcotic agent status: Secondary | ICD-10-CM | POA: Diagnosis not present

## 2016-01-29 DIAGNOSIS — F209 Schizophrenia, unspecified: Secondary | ICD-10-CM | POA: Diagnosis not present

## 2016-01-29 DIAGNOSIS — S8263XA Displaced fracture of lateral malleolus of unspecified fibula, initial encounter for closed fracture: Secondary | ICD-10-CM | POA: Diagnosis not present

## 2016-01-29 DIAGNOSIS — Z91040 Latex allergy status: Secondary | ICD-10-CM | POA: Insufficient documentation

## 2016-01-29 DIAGNOSIS — W19XXXA Unspecified fall, initial encounter: Secondary | ICD-10-CM | POA: Diagnosis not present

## 2016-01-29 DIAGNOSIS — Z9889 Other specified postprocedural states: Secondary | ICD-10-CM | POA: Diagnosis not present

## 2016-01-29 DIAGNOSIS — Z87892 Personal history of anaphylaxis: Secondary | ICD-10-CM | POA: Insufficient documentation

## 2016-01-29 DIAGNOSIS — F319 Bipolar disorder, unspecified: Secondary | ICD-10-CM | POA: Insufficient documentation

## 2016-01-29 DIAGNOSIS — Z91030 Bee allergy status: Secondary | ICD-10-CM | POA: Insufficient documentation

## 2016-01-29 DIAGNOSIS — Z79899 Other long term (current) drug therapy: Secondary | ICD-10-CM | POA: Diagnosis not present

## 2016-01-29 DIAGNOSIS — Z791 Long term (current) use of non-steroidal anti-inflammatories (NSAID): Secondary | ICD-10-CM | POA: Diagnosis not present

## 2016-01-29 DIAGNOSIS — Z886 Allergy status to analgesic agent status: Secondary | ICD-10-CM | POA: Diagnosis not present

## 2016-01-29 DIAGNOSIS — Z888 Allergy status to other drugs, medicaments and biological substances status: Secondary | ICD-10-CM | POA: Diagnosis not present

## 2016-01-29 DIAGNOSIS — S82872A Displaced pilon fracture of left tibia, initial encounter for closed fracture: Secondary | ICD-10-CM | POA: Diagnosis present

## 2016-01-29 DIAGNOSIS — S99912A Unspecified injury of left ankle, initial encounter: Secondary | ICD-10-CM | POA: Insufficient documentation

## 2016-01-29 HISTORY — PX: ORIF ANKLE FRACTURE: SHX5408

## 2016-01-29 LAB — POCT I-STAT, CHEM 8
BUN: 12 mg/dL (ref 6–20)
CALCIUM ION: 1.13 mmol/L — AB (ref 1.15–1.40)
CHLORIDE: 101 mmol/L (ref 101–111)
Creatinine, Ser: 0.8 mg/dL (ref 0.44–1.00)
GLUCOSE: 111 mg/dL — AB (ref 65–99)
HCT: 35 % — ABNORMAL LOW (ref 36.0–46.0)
Hemoglobin: 11.9 g/dL — ABNORMAL LOW (ref 12.0–15.0)
Potassium: 2.9 mmol/L — ABNORMAL LOW (ref 3.5–5.1)
Sodium: 143 mmol/L (ref 135–145)
TCO2: 28 mmol/L (ref 0–100)

## 2016-01-29 SURGERY — OPEN REDUCTION INTERNAL FIXATION (ORIF) ANKLE FRACTURE
Anesthesia: General | Site: Ankle | Laterality: Left

## 2016-01-29 MED ORDER — LACTATED RINGERS IV SOLN
INTRAVENOUS | Status: DC
  Administered 2016-01-29 (×2): via INTRAVENOUS

## 2016-01-29 MED ORDER — OXYCODONE-ACETAMINOPHEN 5-325 MG PO TABS: 1.0000 | ORAL_TABLET | ORAL | 0 refills | Status: DC | PRN

## 2016-01-29 MED ORDER — PROPOFOL 10 MG/ML IV BOLUS
INTRAVENOUS | Status: DC | PRN
  Administered 2016-01-29: 250 mg via INTRAVENOUS

## 2016-01-29 MED ORDER — MEPERIDINE HCL 25 MG/ML IJ SOLN: 6.2500 mg | INTRAMUSCULAR | Status: DC | PRN

## 2016-01-29 MED ORDER — LACTATED RINGERS IV SOLN: INTRAVENOUS | Status: DC

## 2016-01-29 MED ORDER — ONDANSETRON HCL 4 MG/2ML IJ SOLN
INTRAMUSCULAR | Status: DC | PRN
  Administered 2016-01-29: 4 mg via INTRAVENOUS

## 2016-01-29 MED ORDER — 0.9 % SODIUM CHLORIDE (POUR BTL) OPTIME
TOPICAL | Status: DC | PRN
Start: 2016-01-29 — End: 2016-01-29
  Administered 2016-01-29: 250 mL

## 2016-01-29 MED ORDER — BUPIVACAINE HCL (PF) 0.5 % IJ SOLN
INTRAMUSCULAR | Status: DC | PRN
  Administered 2016-01-29: 10 mL

## 2016-01-29 MED ORDER — CHLORHEXIDINE GLUCONATE 4 % EX LIQD: 60.0000 mL | Freq: Once | CUTANEOUS | Status: DC

## 2016-01-29 MED ORDER — POTASSIUM CHLORIDE 10 MEQ/100ML IV SOLN
10.0000 meq | INTRAVENOUS | Status: AC
  Administered 2016-01-29 (×2): 10 meq via INTRAVENOUS

## 2016-01-29 MED ORDER — SODIUM CHLORIDE 0.9 % IV SOLN: INTRAVENOUS | Status: DC

## 2016-01-29 MED ORDER — FENTANYL CITRATE (PF) 100 MCG/2ML IJ SOLN
50.0000 ug | INTRAMUSCULAR | Status: AC | PRN
  Administered 2016-01-29 (×2): 25 ug via INTRAVENOUS
  Administered 2016-01-29: 100 ug via INTRAVENOUS

## 2016-01-29 MED ORDER — GLYCOPYRROLATE 0.2 MG/ML IJ SOLN: 0.2000 mg | Freq: Once | INTRAMUSCULAR | Status: DC | PRN

## 2016-01-29 MED ORDER — OXYCODONE HCL 5 MG/5ML PO SOLN: 5.0000 mg | Freq: Once | ORAL | Status: DC | PRN

## 2016-01-29 MED ORDER — FENTANYL CITRATE (PF) 100 MCG/2ML IJ SOLN
INTRAMUSCULAR | Status: AC
  Filled 2016-01-29: qty 2

## 2016-01-29 MED ORDER — LABETALOL HCL 5 MG/ML IV SOLN
INTRAVENOUS | Status: AC
  Filled 2016-01-29: qty 4

## 2016-01-29 MED ORDER — PROMETHAZINE HCL 25 MG/ML IJ SOLN: 6.2500 mg | INTRAMUSCULAR | Status: DC | PRN

## 2016-01-29 MED ORDER — CEFAZOLIN SODIUM-DEXTROSE 2-4 GM/100ML-% IV SOLN
2.0000 g | INTRAVENOUS | Status: AC
  Administered 2016-01-29: 2 g via INTRAVENOUS

## 2016-01-29 MED ORDER — SCOPOLAMINE 1 MG/3DAYS TD PT72: 1.0000 | MEDICATED_PATCH | Freq: Once | TRANSDERMAL | Status: DC | PRN

## 2016-01-29 MED ORDER — HYDROMORPHONE HCL 1 MG/ML IJ SOLN: 0.2500 mg | INTRAMUSCULAR | Status: DC | PRN

## 2016-01-29 MED ORDER — MIDAZOLAM HCL 2 MG/2ML IJ SOLN
INTRAMUSCULAR | Status: AC
  Filled 2016-01-29: qty 2

## 2016-01-29 MED ORDER — LABETALOL HCL 5 MG/ML IV SOLN
5.0000 mg | INTRAVENOUS | Status: AC | PRN
  Administered 2016-01-29 (×3): 5 mg via INTRAVENOUS

## 2016-01-29 MED ORDER — DEXAMETHASONE SODIUM PHOSPHATE 10 MG/ML IJ SOLN
INTRAMUSCULAR | Status: DC | PRN
  Administered 2016-01-29: 10 mg via INTRAVENOUS

## 2016-01-29 MED ORDER — LIDOCAINE HCL (CARDIAC) 20 MG/ML IV SOLN
INTRAVENOUS | Status: DC | PRN
  Administered 2016-01-29: 80 mg via INTRAVENOUS

## 2016-01-29 MED ORDER — ASPIRIN EC 81 MG PO TBEC: 81.0000 mg | DELAYED_RELEASE_TABLET | Freq: Two times a day (BID) | ORAL | 0 refills | Status: DC

## 2016-01-29 MED ORDER — CEFAZOLIN SODIUM-DEXTROSE 2-4 GM/100ML-% IV SOLN
INTRAVENOUS | Status: AC
  Filled 2016-01-29: qty 100

## 2016-01-29 MED ORDER — BUPIVACAINE-EPINEPHRINE (PF) 0.5% -1:200000 IJ SOLN
INTRAMUSCULAR | Status: DC | PRN
  Administered 2016-01-29: 30 mL via PERINEURAL
  Administered 2016-01-29: 20 mL via PERINEURAL

## 2016-01-29 MED ORDER — OXYCODONE HCL 5 MG PO TABS: 5.0000 mg | ORAL_TABLET | Freq: Once | ORAL | Status: DC | PRN

## 2016-01-29 MED ORDER — MIDAZOLAM HCL 2 MG/2ML IJ SOLN
1.0000 mg | INTRAMUSCULAR | Status: DC | PRN
  Administered 2016-01-29: 2 mg via INTRAVENOUS

## 2016-01-29 SURGICAL SUPPLY — 84 items
BANDAGE ESMARK 6X9 LF (GAUZE/BANDAGES/DRESSINGS) ×1 IMPLANT
BIT DRILL 2.5X2.75 QC CALB (BIT) ×2 IMPLANT
BIT DRILL 2.9 CANN QC NONSTRL (BIT) ×2 IMPLANT
BIT DRILL 3.5X5.5 QC CALB (BIT) ×2 IMPLANT
BLADE SURG 15 STRL LF DISP TIS (BLADE) ×2 IMPLANT
BLADE SURG 15 STRL SS (BLADE) ×6
BNDG CMPR 9X4 STRL LF SNTH (GAUZE/BANDAGES/DRESSINGS)
BNDG CMPR 9X6 STRL LF SNTH (GAUZE/BANDAGES/DRESSINGS) ×1
BNDG COHESIVE 4X5 TAN STRL (GAUZE/BANDAGES/DRESSINGS) ×3 IMPLANT
BNDG COHESIVE 6X5 TAN STRL LF (GAUZE/BANDAGES/DRESSINGS) ×3 IMPLANT
BNDG ESMARK 4X9 LF (GAUZE/BANDAGES/DRESSINGS) IMPLANT
BNDG ESMARK 6X9 LF (GAUZE/BANDAGES/DRESSINGS) ×3
CANISTER SUCT 1200ML W/VALVE (MISCELLANEOUS) ×3 IMPLANT
CHLORAPREP W/TINT 26ML (MISCELLANEOUS) ×3 IMPLANT
COVER BACK TABLE 60X90IN (DRAPES) ×3 IMPLANT
CUFF TOURNIQUET SINGLE 34IN LL (TOURNIQUET CUFF) ×2 IMPLANT
DECANTER SPIKE VIAL GLASS SM (MISCELLANEOUS) IMPLANT
DRAPE EXTREMITY T 121X128X90 (DRAPE) ×3 IMPLANT
DRAPE OEC MINIVIEW 54X84 (DRAPES) ×3 IMPLANT
DRAPE U-SHAPE 47X51 STRL (DRAPES) ×3 IMPLANT
DRSG MEPITEL 4X7.2 (GAUZE/BANDAGES/DRESSINGS) ×3 IMPLANT
DRSG PAD ABDOMINAL 8X10 ST (GAUZE/BANDAGES/DRESSINGS) ×6 IMPLANT
ELECT REM PT RETURN 9FT ADLT (ELECTROSURGICAL) ×3
ELECTRODE REM PT RTRN 9FT ADLT (ELECTROSURGICAL) ×1 IMPLANT
GAUZE SPONGE 4X4 12PLY STRL (GAUZE/BANDAGES/DRESSINGS) ×3 IMPLANT
GLOVE BIOGEL PI IND STRL 7.0 (GLOVE) IMPLANT
GLOVE BIOGEL PI IND STRL 8 (GLOVE) ×2 IMPLANT
GLOVE BIOGEL PI INDICATOR 7.0 (GLOVE) ×2
GLOVE BIOGEL PI INDICATOR 8 (GLOVE) ×2
GLOVE SURG SS PI 6.5 STRL IVOR (GLOVE) ×2 IMPLANT
GLOVE SURG SS PI 7.5 STRL IVOR (GLOVE) ×1 IMPLANT
GLOVE SURG SS PI 8.0 STRL IVOR (GLOVE) ×3 IMPLANT
GOWN STRL REUS W/ TWL LRG LVL3 (GOWN DISPOSABLE) ×1 IMPLANT
GOWN STRL REUS W/ TWL XL LVL3 (GOWN DISPOSABLE) ×2 IMPLANT
GOWN STRL REUS W/TWL LRG LVL3 (GOWN DISPOSABLE) ×3
GOWN STRL REUS W/TWL XL LVL3 (GOWN DISPOSABLE) ×3
K-WIRE ACE 1.6X6 (WIRE) ×6
KWIRE ACE 1.6X6 (WIRE) IMPLANT
NEEDLE HYPO 22GX1.5 SAFETY (NEEDLE) IMPLANT
NS IRRIG 1000ML POUR BTL (IV SOLUTION) ×3 IMPLANT
PACK BASIN DAY SURGERY FS (CUSTOM PROCEDURE TRAY) ×3 IMPLANT
PAD CAST 4YDX4 CTTN HI CHSV (CAST SUPPLIES) ×1 IMPLANT
PADDING CAST ABS 4INX4YD NS (CAST SUPPLIES)
PADDING CAST ABS COTTON 4X4 ST (CAST SUPPLIES) IMPLANT
PADDING CAST COTTON 4X4 STRL (CAST SUPPLIES) ×3
PADDING CAST COTTON 6X4 STRL (CAST SUPPLIES) ×3 IMPLANT
PENCIL BUTTON HOLSTER BLD 10FT (ELECTRODE) ×3 IMPLANT
PLATE ACE 100DEG 8HOLE (Plate) ×2 IMPLANT
PLATE ACE 3.5MM 2HOLE (Plate) ×2 IMPLANT
SANITIZER HAND PURELL 535ML FO (MISCELLANEOUS) ×1 IMPLANT
SCREW ACE CAN 4.0 36M (Screw) ×2 IMPLANT
SCREW CORTICAL 3.5MM  12MM (Screw) ×2 IMPLANT
SCREW CORTICAL 3.5MM  16MM (Screw) ×2 IMPLANT
SCREW CORTICAL 3.5MM  30MM (Screw) ×2 IMPLANT
SCREW CORTICAL 3.5MM  55MM (Screw) ×2 IMPLANT
SCREW CORTICAL 3.5MM 12MM (Screw) IMPLANT
SCREW CORTICAL 3.5MM 14MM (Screw) ×8 IMPLANT
SCREW CORTICAL 3.5MM 16MM (Screw) IMPLANT
SCREW CORTICAL 3.5MM 30MM (Screw) IMPLANT
SCREW CORTICAL 3.5MM 38MM (Screw) ×2 IMPLANT
SCREW CORTICAL 3.5MM 50MM (Screw) ×2 IMPLANT
SCREW CORTICAL 3.5MM 55MM (Screw) IMPLANT
SHEET MEDIUM DRAPE 40X70 STRL (DRAPES) ×3 IMPLANT
SLEEVE SCD COMPRESS KNEE MED (MISCELLANEOUS) ×3 IMPLANT
SPLINT FAST PLASTER 5X30 (CAST SUPPLIES) ×40
SPLINT PLASTER CAST FAST 5X30 (CAST SUPPLIES) ×20 IMPLANT
SPONGE LAP 18X18 X RAY DECT (DISPOSABLE) ×3 IMPLANT
STOCKINETTE 6  STRL (DRAPES) ×2
STOCKINETTE 6 STRL (DRAPES) ×1 IMPLANT
SUCTION FRAZIER HANDLE 10FR (MISCELLANEOUS) ×2
SUCTION TUBE FRAZIER 10FR DISP (MISCELLANEOUS) ×1 IMPLANT
SUT ETHILON 3 0 PS 1 (SUTURE) ×3 IMPLANT
SUT FIBERWIRE #2 38 T-5 BLUE (SUTURE)
SUT MNCRL AB 3-0 PS2 18 (SUTURE) ×2 IMPLANT
SUT VIC AB 0 SH 27 (SUTURE) IMPLANT
SUT VIC AB 2-0 SH 27 (SUTURE) ×3
SUT VIC AB 2-0 SH 27XBRD (SUTURE) ×1 IMPLANT
SUTURE FIBERWR #2 38 T-5 BLUE (SUTURE) IMPLANT
SYR BULB 3OZ (MISCELLANEOUS) ×3 IMPLANT
SYR CONTROL 10ML LL (SYRINGE) ×2 IMPLANT
TOWEL OR 17X24 6PK STRL BLUE (TOWEL DISPOSABLE) ×6 IMPLANT
TUBE CONNECTING 20'X1/4 (TUBING) ×1
TUBE CONNECTING 20X1/4 (TUBING) ×2 IMPLANT
UNDERPAD 30X30 (UNDERPADS AND DIAPERS) ×3 IMPLANT

## 2016-01-29 NOTE — Anesthesia Postprocedure Evaluation (Signed)
Anesthesia Post Note  Patient: Jenna Hill  Procedure(s) Performed: Procedure(s) (LRB): OPEN REDUCTION INTERNAL FIXATION LEFT ANKLE BIMALLEOLAR FRACTURE AND SYNDESMOSIS (Left)  Patient location during evaluation: PACU Anesthesia Type: General and Regional Level of consciousness: awake and alert Pain management: pain level controlled Vital Signs Assessment: post-procedure vital signs reviewed and stable Respiratory status: spontaneous breathing, nonlabored ventilation, respiratory function stable and patient connected to nasal cannula oxygen Cardiovascular status: blood pressure returned to baseline and stable Postop Assessment: no signs of nausea or vomiting Anesthetic complications: no       Last Vitals:  Vitals:   01/29/16 1739 01/29/16 1800  BP: (!) 138/91 (!) 125/92  Pulse: 96 98  Resp: 20 20  Temp:  36.8 C    Last Pain:  Vitals:   01/29/16 1800  TempSrc:   PainSc: 0-No pain                 Effie Berkshire

## 2016-01-29 NOTE — Brief Op Note (Signed)
01/29/2016  4:36 PM  PATIENT:  [name]  45 y.o. female  PRE-OPERATIVE DIAGNOSIS: 1.  Left ankle bimalleolar fracture      2.  Left ankle syndesmosis disruption  POST-OPERATIVE DIAGNOSIS: 1.  Left tibial pilon fracture      2.  Left ankle syndesmosis disruption      3.  Left Weber C lateral malleolus fracture  Procedure(s): 1.  Open treatment of left tibial pilon fracture with internal fixation   2.  Open treatment of left lateral malleolus fracture (separate incision)   3.  Open treatment of left ankle syndesmosis disruption with internal fixation   4.  AP, mortise and lateral xrays of the left ankle   5.  Stress exam of left ankle under fluoro  SURGEON:  Wylene Simmer, MD  ASSISTANT: n/a  ANESTHESIA:   General, regional  EBL:  minimal   TOURNIQUET:   Total Tourniquet Time Documented: Thigh (Left) - 69 minutes Total: Thigh (Left) - 69 minutes  COMPLICATIONS:  None apparent  DISPOSITION:  Extubated, awake and stable to recovery.  DICTATION ID:  [id number]

## 2016-01-29 NOTE — Progress Notes (Signed)
Assisted Dr. Smith Robert with left, ultrasound guided, adductor canal/popliteal block. Side rails up, monitors on throughout procedure. See vital signs in flow sheet. Tolerated Procedure well.

## 2016-01-29 NOTE — Anesthesia Procedure Notes (Signed)
Anesthesia Regional Block:  Popliteal block  Pre-Anesthetic Checklist: ,, timeout performed, Correct Patient, Correct Site, Correct Laterality, Correct Procedure, Correct Position, site marked, Risks and benefits discussed,  Surgical consent,  Pre-op evaluation,  At surgeon's request and post-op pain management  Laterality: Left  Prep: chloraprep       Needles:  Injection technique: Single-shot  Needle Type: Echogenic Needle     Needle Length: 9cm 9 cm Needle Gauge: 21 and 21 G    Additional Needles:  Procedures: ultrasound guided (picture in chart) Popliteal block Narrative:  Start time: 01/29/2016 2:40 PM End time: 01/29/2016 2:45 PM Injection made incrementally with aspirations every 5 mL.  Performed by: Personally  Anesthesiologist: Suella Broad D  Additional Notes: Pt  Tolerated well.

## 2016-01-29 NOTE — H&P (Signed)
Jenna Hill is an 45 y.o. female.   Chief Complaint: left ankle injury HPI: 45 y/o female with PMH of bipolar disorder and schizophrenia fell a few days ago and injured her left ankle.  She was seen in the ER and splinted.  She presented to clinic yesterday.  She presents today for ORIF of her unstable bimal fracture and syndesmosis disruption.  Past Medical History:  Diagnosis Date  . Bipolar 1 disorder ([place])   . Hypertension   . Schizophrenia [facility])     Past Surgical History:  Procedure Laterality Date  . ANKLE ARTHROSCOPY Right   . CESAREAN SECTION    . HAND SURGERY Right     History reviewed. No pertinent family history. Social History:  reports that she has never smoked. She has never used smokeless tobacco. She reports that she drinks alcohol. She reports that she does not use drugs.  Allergies:  Allergies  Allergen Reactions  . Bee Venom Anaphylaxis  . Bretylium     Other reaction(s): Seizures  . Codeine Anaphylaxis  . Other Anaphylaxis    bertrillium or bertillium    . Toradol [Ketorolac Tromethamine] Swelling  . Ibuprofen     Raised hives   . Latex Rash    Medications Prior to Admission  Medication Sig Dispense Refill  . ABILIFY MAINTENA 400 MG PRSY Inject 400 mg into the skin every 30 (thirty) days. 12/18/15  1  . EPINEPHrine 0.3 mg/0.3 mL IJ SOAJ injection Inject 0.3 mLs (0.3 mg total) into the muscle daily as needed (allergic reaction). 1 Device 0  . ESZOPICLONE 3 MG tablet Take 3 mg by mouth at bedtime.  0  . oxyCODONE-acetaminophen (PERCOCET/ROXICET) 5-325 MG tablet Take 1-2 tablets by mouth every 6 (six) hours as needed for severe pain. 15 tablet 0  . QUEtiapine (SEROQUEL XR) 400 MG 24 hr tablet Take 400 mg by mouth 2 (two) times daily.    . TRINTELLIX 10 MG TABS Take 10 mg by mouth 2 (two) times daily.   2  . naproxen (NAPROSYN) 500 MG tablet Take 1 tablet (500 mg total) by mouth 2 (two) times daily. (Patient not taking: Reported on 02-05-16) 20 tablet  0    No results found for this or any previous visit (from the past 48 hour(s)). No results found.  ROS  No recent f/c/n/v/wt loss  Blood pressure (!) 140/103, pulse (!) 104, temperature 97.8 F (36.6 C), temperature source Oral, resp. rate (!) 25, height 5\' 5"  (1.651 m), weight 83 kg (183 lb), last menstrual period 01/07/2016, SpO2 96 %. Physical Exam  wn wd woman in nad.  A and o x 4.  Mood and affect normal.  EOMI.  resp unlabored.  L ankle with healthy skin.  No lymphadenopathy.  5/5 strength in PF and DF of the anklea nd toes.  Sens to LT intact at the dorsal and platnar foot.  Brisk cap refill at the toes.  Assessment/Plan L ankle bimal fracture and syndesmosis disruption - to OR for ORIF.  The risks and benefits of the alternative treatment options have been discussed in detail.  The patient wishes to proceed with surgery and specifically understands risks of bleeding, infection, nerve damage, blood clots, need for additional surgery, amputation and death.   Wylene Simmer, MD 05-Feb-2016, 2:25 PM

## 2016-01-29 NOTE — Op Note (Signed)
Jenna Hill, Jenna Hill NO.:  [id number]  MEDICAL RECORD NO.:  [id number]  LOCATION:                                 FACILITY:  PHYSICIAN:  Wylene Simmer, MD        DATE OF BIRTH:  03/06/1970  DATE OF PROCEDURE:  01/29/2016 DATE OF DISCHARGE:                              OPERATIVE REPORT   PREOPERATIVE DIAGNOSES: 1. Left ankle bimalleolar fracture (lateral and posterior malleoli). 2. Left ankle syndesmosis disruption.  POSTOPERATIVE DIAGNOSES: 1. Left tibial pilon fracture. 2. Left ankle syndesmosis disruption. 3. Left lateral malleolus Weber C fracture.  PROCEDURE: 1. Open treatment of left tibial pilon fracture with internal     fixation. 2. Open treatment of left lateral malleolus fracture with internal     fixation through a separate incision. 3. Open treatment of left ankle syndesmosis disruption with internal     fixation. 4. AP, mortise, and lateral radiographs of the left ankle. 5. Stress examination of the left ankle under fluoroscopy.  SURGEON:  Wylene Simmer, MD.  ANESTHESIA:  General, regional.  ESTIMATED BLOOD LOSS:  Minimal.  TOURNIQUET TIME:  69 minutes at 250 mmHg.  COMPLICATIONS:  None apparent.  DISPOSITION:  Extubated, awake, and stable to recovery.  INDICATIONS FOR PROCEDURE:  The patient is a 45 year old woman who injured her left ankle approximately a week ago.  She was seen in clinic and found to have a displaced fracture of the ankle with syndesmosis disruption.  She presents now for operative treatment of this injury. She understands the risks and benefits of the alternative treatment options and elects surgical treatment.  She specifically understands risks of bleeding, infection, nerve damage, blood clots, need for additional surgery, continued pain, nonunion, amputation, posttraumatic arthritis, and death.  PROCEDURE IN DETAIL:  After preoperative consent was obtained and the correct operative site was identified, the  patient was brought to the operating room and placed supine on the operating table.  General anesthesia was induced.  Preoperative antibiotics were administered. Surgical time-out was taken.  Left lower extremity was prepped and draped in standard sterile fashion with a tourniquet around the thigh. The extremity was exsanguinated and the tourniquet was inflated to 250 mmHg.  The patient was positioned on a bean bag in a semi-lateral position.  The left lower extremity was exsanguinated and the tourniquet was inflated to 250 mmHg.  A posterolateral incision was made midway between the peroneals and the Achilles tendon.  Dissection was carried down through the skin and subcutaneous tissues.  Care was taken to protect the branches of the sural nerve.  The interval between the peroneals and the flexor hallucis longus was then developed.  The posterior aspect of the tibia was examined and the fracture site was identified.  Periosteum was incised and elevated.  The fracture was mobilized.  Dissection was then carried forward in subcutaneous plane to the fibular fracture.  This was cleared of all hematoma and mobilized as well.  Attention was then turned to the medial aspect of the ankle where another incision was made longitudinally.  Dissection was carried down through the subcutaneous tissues and the periosteum was incised over the medial malleolus.  Dissection was carried in subperiosteal fashion posteriorly to  the fracture line.  The fracture was noted to involve a significant portion of the tibial plafond making this into a tibial pilon fracture.  The intra-articular fragments were removed after mobilizing the fracture site appropriately.  Wound was irrigated copiously.  The dome of the talus was inspected and there was no gross evidence of any chondral damage.  Attention was then returned to the lateral incision.  The fracture line posteriorly was inspected and used to key the fracture  fragments back into the appropriate position.  An anterior incision was made and a Weber tenaculum was placed across the fracture line.  This compressed the fracture appropriately.  A K-wire was then inserted from posterior to anterior at the distal portion of the fracture line.  The K-wire was over drilled and a 4-mm partially threaded cannulated screw was inserted.  This compressed the fracture appropriately.  A two-hole one- third tubular plate was then applied over the apex of the fracture at the posterior cortex.  A 3.5-mm fully-threaded screws were inserted proximal and distal to the fracture line positioning the plate as a buttress.  AP and lateral radiographs confirmed appropriate reduction of the fracture and appropriate position and length of all 3 screws.  Attention was then turned to the lateral malleolus where the fracture was reduced and held with a lobster claw clamp.  A 3.5 mm fully-threaded lag screw was inserted from posterior to anterior.  It was noted to have excellent purchase and compressed the fracture site appropriately.  An 8- hole one-third tubular plate was then contoured to fit the lateral malleolus.  It was secured proximally with 3 bicortical screws and distally with 2 unicortical screws.  Stress examination of the ankle was then performed.  There was widening noted at the syndesmosis and ankle mortise.  A [place] clamp was then placed across the syndesmosis with the tips positioned at the medial and lateral malleoli.  The ankle was dorsiflexed and the clamp was tightened appropriately.  Two 3.5-mm fully-threaded screws were then placed across the syndesmosis through all 4 cortices of the distal tibia and fibula. The clamp was removed.  Final AP, mortise, and lateral radiographs confirmed appropriate position and length of all hardware and appropriate reduction of the fractures.  Both wounds were then irrigated copiously.  Deep subcutaneous tissues were  approximated with Vicryl, superficial subcutaneous tissues were approximated with Monocryl, and skin incisions were closed with running 3-0 nylon suture.  Sterile dressings were applied followed by well-padded short-leg splint. Tourniquet was released after application of the dressings at 69 minutes.  The patient was awakened from anesthesia and transported to the recovery room in stable condition.  FOLLOWUP PLAN:  The patient will be nonweightbearing on the left lower extremity.  She will follow up with me in the office in 2 weeks for suture removal and conversion to a short-leg cast.  We will plan to initiate weightbearing at 6 weeks postop.  She will take aspirin for DVT prophylaxis.  RADIOGRAPHS:  AP, mortise, and lateral radiographs of the left ankle were obtained intraoperatively.  These show interval reduction and fixation of the tibial pilon and fibula fractures.  Hardware is appropriately positioned and of the appropriate length.     Wylene Simmer, MD     JH/MEDQ  D:  01/29/2016  T:  01/29/2016  Job:  [id number]

## 2016-01-29 NOTE — Anesthesia Procedure Notes (Signed)
Procedure Name: LMA Insertion Date/Time: 01/29/2016 2:59 PM Performed by: Melynda Ripple D Pre-anesthesia Checklist: Patient identified, Emergency Drugs available, Suction available and Patient being monitored Patient Re-evaluated:Patient Re-evaluated prior to inductionOxygen Delivery Method: Circle system utilized Preoxygenation: Pre-oxygenation with 100% oxygen Intubation Type: IV induction Ventilation: Mask ventilation without difficulty LMA: LMA inserted LMA Size: 4.0 Number of attempts: 1 Airway Equipment and Method: Bite block Placement Confirmation: positive ETCO2 Tube secured with: Tape Dental Injury: Teeth and Oropharynx as per pre-operative assessment

## 2016-01-29 NOTE — Discharge Instructions (Addendum)
Wylene Simmer, MD [place]  Please read the following information regarding your care after surgery.  Medications  You only need a prescription for the narcotic pain medicine (ex. oxycodone, Percocet, Norco).  All of the other medicines listed below are available over the counter. X ibuprofen 800 mg every 8 hours as you need for mild to moderate pain X Percocet as prescribed for severe pain  Narcotic pain medicine (ex. oxycodone, Percocet, Vicodin) will cause constipation.  To prevent this problem, take the following medicines while you are taking any pain medicine. X docusate sodium (Colace) 100 mg twice a day X senna (Senokot) 2 tablets twice a day  X To help prevent blood clots, take a baby aspirin (81 mg) twice a day for two weeks after surgery.  You should also get up every hour while you are awake to move around.    Weight Bearing ? Bear weight when you are able on your operated leg or foot. ? Bear weight only on the heel of your operated foot in the post-op shoe. X Do not bear any weight on the operated leg or foot.  Cast / Splint / Dressing X Keep your splint or cast clean and dry.  Dont put anything (coat hanger, pencil, etc) down inside of it.  If it gets damp, use a hair dryer on the cool setting to dry it.  If it gets soaked, call the office to schedule an appointment for a cast change. ? Remove your dressing 3 days after surgery and cover the incisions with dry dressings.    After your dressing, cast or splint is removed; you may shower, but do not soak or scrub the wound.  Allow the water to run over it, and then gently pat it dry.  Swelling It is normal for you to have swelling where you had surgery.  To reduce swelling and pain, keep your toes above your nose for at least 3 days after surgery.  It may be necessary to keep your foot or leg elevated for several weeks.  If it hurts, it should be elevated.  Follow Up Call my office at [phone] when you are  discharged from the hospital or surgery center to schedule an appointment to be seen two weeks after surgery.  Call my office at [phone] if you develop a fever >101.5 F, nausea, vomiting, bleeding from the surgical site or severe pain.     Post Anesthesia Home Care Instructions  Activity: Get plenty of rest for the remainder of the day. A responsible adult should stay with you for 24 hours following the procedure.  For the next 24 hours, DO NOT: -Drive a car -Paediatric nurse -Drink alcoholic beverages -Take any medication unless instructed by your physician -Make any legal decisions or sign important papers.  Meals: Start with liquid foods such as gelatin or soup. Progress to regular foods as tolerated. Avoid greasy, spicy, heavy foods. If nausea and/or vomiting occur, drink only clear liquids until the nausea and/or vomiting subsides. Call your physician if vomiting continues.  Special Instructions/Symptoms: Your throat may feel dry or sore from the anesthesia or the breathing tube placed in your throat during surgery. If this causes discomfort, gargle with warm salt water. The discomfort should disappear within 24 hours.  If you had a scopolamine patch placed behind your ear for the management of post- operative nausea and/or vomiting:  1. The medication in the patch is effective for 72 hours, after which it should be removed.  Wrap  patch in a tissue and discard in the trash. Wash hands thoroughly with soap and water. 2. You may remove the patch earlier than 72 hours if you experience unpleasant side effects which may include dry mouth, dizziness or visual disturbances. 3. Avoid touching the patch. Wash your hands with soap and water after contact with the patch.   Regional Anesthesia Blocks  1. Numbness or the inability to move the "blocked" extremity may last from 3-48 hours after placement. The length of time depends on the medication injected and your individual response  to the medication. If the numbness is not going away after 48 hours, call your surgeon.  2. The extremity that is blocked will need to be protected until the numbness is gone and the  Strength has returned. Because you cannot feel it, you will need to take extra care to avoid injury. Because it may be weak, you may have difficulty moving it or using it. You may not know what position it is in without looking at it while the block is in effect.  3. For blocks in the legs and feet, returning to weight bearing and walking needs to be done carefully. You will need to wait until the numbness is entirely gone and the strength has returned. You should be able to move your leg and foot normally before you try and bear weight or walk. You will need someone to be with you when you first try to ensure you do not fall and possibly risk injury.  4. Bruising and tenderness at the needle site are common side effects and will resolve in a few days.  5. Persistent numbness or new problems with movement should be communicated to the surgeon or the [place] [phone] [place] [phone]).

## 2016-01-29 NOTE — Transfer of Care (Signed)
Immediate Anesthesia Transfer of Care Note  Patient: Jenna Hill  Procedure(s) Performed: Procedure(s): OPEN REDUCTION INTERNAL FIXATION LEFT ANKLE BIMALLEOLAR FRACTURE AND SYNDESMOSIS (Left)  Patient Location: PACU  Anesthesia Type:General and GA combined with regional for post-op pain  Level of Consciousness: awake and alert   Airway & Oxygen Therapy: Patient Spontanous Breathing and Patient connected to face mask oxygen  Post-op Assessment: Report given to RN and Post -op Vital signs reviewed and stable  Post vital signs: Reviewed and stable  Last Vitals:  Vitals:   01/29/16 1440 01/29/16 1445  BP:  (!) 143/98  Pulse: 96 (!) 102  Resp: (!) 21 16  Temp:      Last Pain:  Vitals:   01/29/16 1406  TempSrc: Oral  PainSc: 9          Complications: No apparent anesthesia complications

## 2016-01-29 NOTE — Anesthesia Preprocedure Evaluation (Addendum)
Anesthesia Evaluation  Patient identified by MRN, date of birth, ID band Patient awake    Reviewed: Allergy & Precautions, NPO status , Patient's Chart, lab work & pertinent test results  Airway Mallampati: II  TM Distance: >3 FB Neck ROM: Full    Dental  (+) Teeth Intact, Dental Advisory Given   Pulmonary sleep apnea ,    breath sounds clear to auscultation       Cardiovascular hypertension,  Rhythm:Regular Rate:Normal     Neuro/Psych PSYCHIATRIC DISORDERS Depression Bipolar Disorder Schizophrenia    GI/Hepatic negative GI ROS, Neg liver ROS,   Endo/Other  negative endocrine ROS  Renal/GU negative Renal ROS  negative genitourinary   Musculoskeletal negative musculoskeletal ROS (+)   Abdominal   Peds negative pediatric ROS (+)  Hematology negative hematology ROS (+)   Anesthesia Other Findings   Reproductive/Obstetrics negative OB ROS                            Lab Results  Component Value Date   WBC 7.5 04/14/2015   HGB 13.6 04/14/2015   HCT 38.6 04/14/2015   MCV 83.9 04/14/2015   PLT 413 (H) 04/14/2015   Lab Results  Component Value Date   CREATININE 1.00 04/14/2015   BUN 15 04/14/2015   NA 141 04/14/2015   K 3.4 (L) 04/14/2015   CL 104 04/14/2015   CO2 28 04/14/2015   Lab Results  Component Value Date   INR 0.98 10/05/2012   EKG: normal sinus rhythm.  Anesthesia Physical Anesthesia Plan  ASA: II  Anesthesia Plan: General   Post-op Pain Management: GA combined w/ Regional for post-op pain   Induction: Intravenous  Airway Management Planned: LMA  Additional Equipment:   Intra-op Plan:   Post-operative Plan: Extubation in OR  Informed Consent: I have reviewed the patients History and Physical, chart, labs and discussed the procedure including the risks, benefits and alternatives for the proposed anesthesia with the patient or authorized representative who has  indicated his/her understanding and acceptance.   Dental advisory given  Plan Discussed with: CRNA  Anesthesia Plan Comments:         Anesthesia Quick Evaluation

## 2016-01-29 NOTE — Anesthesia Procedure Notes (Signed)
Anesthesia Regional Block:  Adductor canal block  Pre-Anesthetic Checklist: ,, timeout performed, Correct Patient, Correct Site, Correct Laterality, Correct Procedure, Correct Position, site marked, Risks and benefits discussed,  Surgical consent,  Pre-op evaluation,  At surgeon's request and post-op pain management  Laterality: Left  Prep: chloraprep       Needles:  Injection technique: Single-shot  Needle Type: Echogenic Needle     Needle Length: 9cm 9 cm Needle Gauge: 21 and 21 G    Additional Needles:  Procedures: ultrasound guided (picture in chart) Adductor canal block Narrative:  Start time: 01/29/2016 2:45 PM End time: 01/29/2016 2:50 PM Injection made incrementally with aspirations every 5 mL.  Performed by: Personally  Anesthesiologist: Suella Broad D  Additional Notes: Pt tolerated well.

## 2016-01-30 ENCOUNTER — Encounter (HOSPITAL_BASED_OUTPATIENT_CLINIC_OR_DEPARTMENT_OTHER): Payer: Self-pay | Admitting: Orthopedic Surgery

## 2016-02-04 NOTE — Addendum Note (Signed)
Addendum  created 02/04/16 1218 by Tawni Millers, CRNA   Charge Capture section accepted

## 2016-02-05 ENCOUNTER — Emergency Department (HOSPITAL_COMMUNITY)
Admission: EM | Admit: 2016-02-05 | Discharge: 2016-02-05 | Disposition: A | Discharge status: Home / Self Care | Payer: Medicaid Other | Attending: Emergency Medicine | Admitting: Emergency Medicine

## 2016-02-05 ENCOUNTER — Emergency Department (HOSPITAL_BASED_OUTPATIENT_CLINIC_OR_DEPARTMENT_OTHER)
Admit: 2016-02-05 | Discharge: 2016-02-05 | Disposition: A | Discharge status: Home / Self Care | Payer: Medicaid Other | Attending: Emergency Medicine | Admitting: Emergency Medicine

## 2016-02-05 ENCOUNTER — Emergency Department (HOSPITAL_COMMUNITY): Payer: Medicaid Other

## 2016-02-05 ENCOUNTER — Encounter (HOSPITAL_COMMUNITY): Payer: Self-pay | Admitting: Emergency Medicine

## 2016-02-05 DIAGNOSIS — M7989 Other specified soft tissue disorders: Secondary | ICD-10-CM | POA: Diagnosis not present

## 2016-02-05 DIAGNOSIS — Z7982 Long term (current) use of aspirin: Secondary | ICD-10-CM | POA: Diagnosis not present

## 2016-02-05 DIAGNOSIS — Z91040 Latex allergy status: Secondary | ICD-10-CM | POA: Insufficient documentation

## 2016-02-05 DIAGNOSIS — M79609 Pain in unspecified limb: Secondary | ICD-10-CM

## 2016-02-05 DIAGNOSIS — I10 Essential (primary) hypertension: Secondary | ICD-10-CM | POA: Insufficient documentation

## 2016-02-05 DIAGNOSIS — M25572 Pain in left ankle and joints of left foot: Secondary | ICD-10-CM | POA: Diagnosis not present

## 2016-02-05 MED ORDER — CYCLOBENZAPRINE HCL 10 MG PO TABS: 10.0000 mg | ORAL_TABLET | Freq: Two times a day (BID) | ORAL | 0 refills | Status: DC | PRN

## 2016-02-05 MED ORDER — OXYCODONE-ACETAMINOPHEN 5-325 MG PO TABS
1.0000 | ORAL_TABLET | Freq: Once | ORAL | Status: AC
  Administered 2016-02-05: 1 via ORAL
  Filled 2016-02-05: qty 1

## 2016-02-05 MED ORDER — OXYCODONE-ACETAMINOPHEN 5-325 MG PO TABS: 1.0000 | ORAL_TABLET | ORAL | 0 refills | Status: DC | PRN

## 2016-02-05 NOTE — ED Notes (Signed)
Patient transported to Ultrasound 

## 2016-02-05 NOTE — ED Notes (Signed)
Patient transported to X-ray 

## 2016-02-05 NOTE — ED Provider Notes (Signed)
[facility] DEPT Provider Note   CSN: [id number] Arrival date & time: 02/05/16  [id number]     History   Chief Complaint Chief Complaint  Patient presents with  . Ankle Pain    Post Surgery    HPI Jenna Hill is a 46 y.o. female.  HPI   46 year old female with history of bipolar, schizophrenia, hypertension, recent left ankle fracture presenting with complaint of pain and swelling of the left ankle.  Patient suffered a bimalleolar closed fx of the L ankle requiring ORIF by Dr. Doran Durand on 01/29/16.  Patient received pain medication at home which did help however she ran out of her pain medication (percocet) 2 days ago and since then she has had increasing worsening burning throbbing pain to her left lower leg. She also felt that her leg is becoming more swollen which concerns her. She felt her ankle is rubbing against the cast. She tries taking Motrin at home without adequate improvement. She denies having fever, productive cough, hemoptysis, chest pain, shortness of breath, numbness or weakness  Past Medical History:  Diagnosis Date  . Bipolar 1 disorder ([place])   . Hypertension   . Schizophrenia [facility])     Patient Active Problem List   Diagnosis Date Noted  . 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 OSA (obstructive sleep apnea) 04/16/2015  . Obesity 04/16/2015  . Schizoaffective disorder, depressive type ([place]) 04/15/2015  . Hypertension 10/05/2012    Past Surgical History:  Procedure Laterality Date  . ANKLE ARTHROSCOPY Right   . CESAREAN SECTION    . HAND SURGERY Right   . ORIF ANKLE FRACTURE Left 01/29/2016   Procedure: OPEN REDUCTION INTERNAL FIXATION LEFT ANKLE BIMALLEOLAR FRACTURE AND SYNDESMOSIS;  Surgeon: Wylene Simmer, MD;  Location: [place];  Service: Orthopedics;  Laterality: Left;    OB History    No data available       Home Medications    Prior to Admission medications   Medication Sig Start Date End Date Taking? Authorizing  Provider  ABILIFY MAINTENA 400 MG PRSY Inject 400 mg into the skin every 30 (thirty) days. 12/18/15 12/16/15   Historical Provider, MD  aspirin EC 81 MG tablet Take 1 tablet (81 mg total) by mouth 2 (two) times daily. 01/29/16   Wylene Simmer, MD  EPINEPHrine 0.3 mg/0.3 mL IJ SOAJ injection Inject 0.3 mLs (0.3 mg total) into the muscle daily as needed (allergic reaction). 04/23/15   Hildred Priest, MD  ESZOPICLONE 3 MG tablet Take 3 mg by mouth at bedtime. 12/16/15   Historical Provider, MD  naproxen (NAPROSYN) 500 MG tablet Take 1 tablet (500 mg total) by mouth 2 (two) times daily. Patient not taking: Reported on 01/29/2016 01/22/16   Carlisle Cater, PA-C  oxyCODONE-acetaminophen (PERCOCET/ROXICET) 5-325 MG tablet Take 1-2 tablets by mouth every 4 (four) hours as needed for severe pain (for 3 days after surgery.). 01/29/16   Wylene Simmer, MD  QUEtiapine (SEROQUEL XR) 400 MG 24 hr tablet Take 400 mg by mouth 2 (two) times daily.    Historical Provider, MD  triamterene-hydrochlorothiazide (MAXZIDE) 75-50 MG tablet Take 1 tablet by mouth daily.    Historical Provider, MD  TRINTELLIX 10 MG TABS Take 10 mg by mouth 2 (two) times daily.  12/19/15   Historical Provider, MD    Family History No family history on file.  Social History Social History  Substance Use Topics  . Smoking status: Never Smoker  . Smokeless tobacco: Never Used  . Alcohol use Yes  Comment: occ     Allergies   Bee venom; Bretylium; Codeine; Other; Toradol [ketorolac tromethamine]; Ibuprofen; and Latex   Review of Systems Review of Systems  Constitutional: Negative for fever.  Musculoskeletal: Positive for arthralgias.  Skin: Negative for rash.  Neurological: Negative for numbness.     Physical Exam Updated Vital Signs BP (!) 167/105 (BP Location: Right Arm)   Pulse 116   Temp 97.4 F (36.3 C) (Oral)   Resp 18   Ht 5\' 5"  (1.651 m)   Wt 83 kg   LMP 01/07/2016   SpO2 98%   BMI 30.45 kg/m    Physical Exam  Constitutional: She appears well-developed and well-nourished. No distress.  HENT:  Head: Atraumatic.  Eyes: Conjunctivae are normal.  Neck: Neck supple.  Musculoskeletal: She exhibits tenderness (Left lower extremity: Posterior splint is in place. Sensation is intact to distal toes with good perfusion.).  Neurological: She is alert.  Skin: No rash noted.  Psychiatric: She has a normal mood and affect.  Nursing note and vitals reviewed.    ED Treatments / Results  Labs (all labs ordered are listed, but only abnormal results are displayed) Labs Reviewed - No data to display  EKG  EKG Interpretation None       Radiology Dg Ankle Complete Left  Result Date: 02/05/2016 CLINICAL DATA:  Left ankle fractures.  Cast is tight and rubbing EXAM: LEFT ANKLE COMPLETE - 3+ VIEW COMPARISON:  01/22/2016 FINDINGS: Images obtained through plaster demonstrate good alignment and position of the tibial and fibular fracture fragments. Plate screw and inter fragmentary screw distal fibular fixation. Interosseous screw fixations. Plate screw posterior tibial fixation. The fixation hardware appears intact. IMPRESSION: Good alignment and position of the tibial and fibular fractures. Hardware appears intact. Electronically Signed   By: Andreas Newport M.D.   On: 02/05/2016 06:28    Procedures Procedures (including critical care time)  VASCULAR LAB PRELIMINARY  PRELIMINARY  PRELIMINARY  PRELIMINARY  Left lower extremity venous duplex completed.    Preliminary report:  Left:  No evidence of DVT, superficial thrombosis, or Baker's cyst.  SLAUGHTER, [place], RVS 02/05/2016, 8:22 AM    Medications Ordered in ED Medications  oxyCODONE-acetaminophen (PERCOCET/ROXICET) 5-325 MG per tablet 1 tablet (1 tablet Oral Given 02/05/16 0724)     Initial Impression / Assessment and Plan / ED Course  I have reviewed the triage vital signs and the nursing notes.  Pertinent labs &  imaging results that were available during my care of the patient were reviewed by me and considered in my medical decision making (see chart for details).  Clinical Course     BP 154/91   Pulse 95   Temp 97.4 F (36.3 C) (Oral)   Resp 18   Ht 5\' 5"  (1.651 m)   Wt 83 kg   LMP 01/07/2016   SpO2 98%   BMI 30.45 kg/m    Final Clinical Impressions(s) / ED Diagnoses   Final diagnoses:  Left ankle pain, unspecified chronicity    New Prescriptions New Prescriptions   CYCLOBENZAPRINE (FLEXERIL) 10 MG TABLET    Take 1 tablet (10 mg total) by mouth 2 (two) times daily as needed for muscle spasms.   6:36 AM Patient here with complaints of worsening pain to her recently surgical repair left ankle, as well as increase swelling. This started after her Percocet ran out. Although her pain is likely due to inadequate pain control, given recent surgery, I will obtain a Doppler study to rule  out DVT. I will also remove the splints for further evaluation of the skin. Pain medication given.  7:04 AM Left stirrup splint removed. Left compartment is soft, intact dorsalis pedis pulse and posterior tibialis pulse. Dependent edema and ecchymosis noted to the distal foot. No palpable cord and no erythema. X-ray of left ankle demonstrating good alignment in positions of the tibial and fibular fracture with hardware intact.  9:09 AM DVT Doppler study of left lower extremities without any signs of DVT.  I will replace another stirrup splint and d/c pt with a short course of pain medication.  outpt f/u recommended.  Return precaution discussed.    Domenic Moras, PA-C 02/05/16 [place], MD 02/06/16 [phone]

## 2016-02-05 NOTE — Progress Notes (Signed)
Orthopedic Tech Progress Note Patient Details:  Jenna Hill 1970-06-28 [id number]  Ortho Devices Type of Ortho Device: Ace wrap, Stirrup splint Ortho Device/Splint Interventions: Application   Jenna Hill 02/05/2016, 9:57 AM

## 2016-02-05 NOTE — ED Notes (Signed)
[place] PA at bedside 

## 2016-02-05 NOTE — Discharge Instructions (Signed)
Please follow up with orthopedist for further management of your ankle surgery. Keep ankle elevated above heart when rest to help decrease swelling.  Take pain medication as needed for pain.

## 2016-02-05 NOTE — Progress Notes (Signed)
Orthopedic Tech Progress Note Patient Details:  Jenna Hill [birth year] [id number]  Ortho Devices Type of Ortho Device: Stirrup splint Ortho Device/Splint Interventions: Removal   [name] 02/05/2016, 8:09 AM

## 2016-02-05 NOTE — ED Notes (Signed)
Spoke with Ortho tech to come take off ankle cast.

## 2016-02-05 NOTE — Progress Notes (Signed)
VASCULAR LAB PRELIMINARY  PRELIMINARY  PRELIMINARY  PRELIMINARY  Left lower extremity venous duplex completed.    Preliminary report:  Left:  No evidence of DVT, superficial thrombosis, or Baker's cyst.  Mande Auvil, RVS 02/05/2016, 8:22 AM

## 2016-02-05 NOTE — ED Notes (Signed)
Ortho tech paged for stirrup splint

## 2016-02-05 NOTE — ED Notes (Signed)
Ortho returned page, will place splint

## 2016-02-05 NOTE — ED Triage Notes (Signed)
Patient reports pain and swelling at left ankle " cast is rubbing and getting tight" , she had a surgery last 01/29/16 left ankle ORIF by Dr. Doran Durand.

## 2016-02-22 ENCOUNTER — Emergency Department (HOSPITAL_COMMUNITY)
Admission: EM | Admit: 2016-02-22 | Discharge: 2016-02-22 | Disposition: A | Discharge status: Home / Self Care | Payer: Medicaid Other | Attending: Emergency Medicine | Admitting: Emergency Medicine

## 2016-02-22 ENCOUNTER — Encounter (HOSPITAL_COMMUNITY): Payer: Self-pay | Admitting: Emergency Medicine

## 2016-02-22 DIAGNOSIS — Z79899 Other long term (current) drug therapy: Secondary | ICD-10-CM | POA: Insufficient documentation

## 2016-02-22 DIAGNOSIS — M25572 Pain in left ankle and joints of left foot: Secondary | ICD-10-CM | POA: Insufficient documentation

## 2016-02-22 DIAGNOSIS — Z7982 Long term (current) use of aspirin: Secondary | ICD-10-CM | POA: Insufficient documentation

## 2016-02-22 DIAGNOSIS — Z91040 Latex allergy status: Secondary | ICD-10-CM | POA: Insufficient documentation

## 2016-02-22 DIAGNOSIS — I10 Essential (primary) hypertension: Secondary | ICD-10-CM | POA: Diagnosis not present

## 2016-02-22 NOTE — Discharge Instructions (Signed)
See Dr. Doran Durand tomorrow to re-apply the cast.

## 2016-02-22 NOTE — ED Triage Notes (Signed)
Pt is c/o pain to the left ankle  Pt had surgery by Dr Doran Durand with [facility] on Dec 28th  Pt is c/o that her cast is rubbing on her ankle causing her pain  Pt states she is having numbness and tingling to that area  Pt has good sensation and movement to her toes

## 2016-02-22 NOTE — ED Provider Notes (Signed)
[facility] DEPT Provider Note   CSN: [id number] Arrival date & time: 02/22/16  0326     History   Chief Complaint Chief Complaint  Patient presents with  . Wound Check  . Cast to tight    HPI Jenna Hill is a 46 y.o. female.  She is complaining of pain in her left ankle-mainly over the medial malleolus. She has a cast in place which had been put on 5 days ago. She started having pain in the ankle 3 days ago. She is afraid that her ankle is swelling and rubbing up against the cast. She rates pain at 7/10. She is status post open reduction and internal fixation of ankle fracture. Fracture occurred December 21. She denies numbness or tingling.   The history is provided by the patient.  Wound Check     Past Medical History:  Diagnosis Date  . Bipolar 1 disorder ([place])   . Hypertension   . Schizophrenia [facility])     Patient Active Problem List   Diagnosis Date Noted  . 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 OSA (obstructive sleep apnea) 04/16/2015  . Obesity 04/16/2015  . Schizoaffective disorder, depressive type ([place]) 04/15/2015  . Hypertension 10/05/2012    Past Surgical History:  Procedure Laterality Date  . ANKLE ARTHROSCOPY Right   . CESAREAN SECTION    . HAND SURGERY Right   . ORIF ANKLE FRACTURE Left 01/29/2016   Procedure: OPEN REDUCTION INTERNAL FIXATION LEFT ANKLE BIMALLEOLAR FRACTURE AND SYNDESMOSIS;  Surgeon: Wylene Simmer, MD;  Location: [place];  Service: Orthopedics;  Laterality: Left;    OB History    No data available       Home Medications    Prior to Admission medications   Medication Sig Start Date End Date Taking? Authorizing Provider  ABILIFY MAINTENA 400 MG PRSY Inject 400 mg into the skin every 30 (thirty) days. 12/18/15 12/16/15   Historical Provider, MD  aspirin EC 81 MG tablet Take 1 tablet (81 mg total) by mouth 2 (two) times daily. 01/29/16   Wylene Simmer, MD  cyclobenzaprine (FLEXERIL) 10 MG tablet Take 1  tablet (10 mg total) by mouth 2 (two) times daily as needed for muscle spasms. 02/05/16   Domenic Moras, PA-C  EPINEPHrine 0.3 mg/0.3 mL IJ SOAJ injection Inject 0.3 mLs (0.3 mg total) into the muscle daily as needed (allergic reaction). 04/23/15   Hildred Priest, MD  ESZOPICLONE 3 MG tablet Take 3 mg by mouth at bedtime. 12/16/15   Historical Provider, MD  oxyCODONE-acetaminophen (PERCOCET/ROXICET) 5-325 MG tablet Take 1-2 tablets by mouth every 4 (four) hours as needed for severe pain (for 3 days after surgery.). 02/05/16   Domenic Moras, PA-C  QUEtiapine (SEROQUEL XR) 400 MG 24 hr tablet Take 400 mg by mouth 2 (two) times daily.    Historical Provider, MD  triamterene-hydrochlorothiazide (MAXZIDE) 75-50 MG tablet Take 1 tablet by mouth daily.    Historical Provider, MD  TRINTELLIX 10 MG TABS Take 10 mg by mouth 2 (two) times daily.  12/19/15   Historical Provider, MD    Family History History reviewed. No pertinent family history.  Social History Social History  Substance Use Topics  . Smoking status: Never Smoker  . Smokeless tobacco: Never Used  . Alcohol use Yes     Comment: occ     Allergies   Bee venom; Bretylium; Codeine; Other; Toradol [ketorolac tromethamine]; Ibuprofen; and Latex   Review of Systems Review of Systems  All other systems reviewed  and are negative.    Physical Exam Updated Vital Signs BP (!) 160/119 (BP Location: Left Arm)   Pulse 120   Temp 97.9 F (36.6 C) (Oral)   Resp 19   Ht 5\' 3"  (1.6 m)   Wt 183 lb (83 kg)   LMP 02/17/2016 (Approximate)   SpO2 100%   BMI 32.42 kg/m   Physical Exam  Nursing note and vitals reviewed.  46 year old female, resting comfortably and in no acute distress. Vital signs are Significant for hypertension and tachycardia. Oxygen saturation is 100%, which is normal. Head is normocephalic and atraumatic. PERRLA, EOMI. Oropharynx is clear. Neck is nontender and supple without adenopathy or JVD. Back is nontender and  there is no CVA tenderness. Lungs are clear without rales, wheezes, or rhonchi. Chest is nontender. Heart has regular rate and rhythm without murmur. Abdomen is soft, flat, nontender without masses or hepatosplenomegaly and peristalsis is normoactive. Extremities have no cyanosis or edema, full range of motion is present. Short leg fiberglass cast is in place on the left lower leg. Toes are warm and I refill is prompt. Skin is warm and dry without rash. Neurologic: Mental status is normal, cranial nerves are intact, there are no motor or sensory deficits.  ED Treatments / Results   Procedures Procedures (including critical care time) Bivalve cast Indication: Ankle pain with recent application of cast Prior to procedure, timeout was performed. Patient identified verbally and with hospital provided wrist band. Using a cast saw, the short leg cast was bivalved without difficulty. Patient tolerated procedure well. She reported significant pain relief.  Medications Ordered in ED Medications - No data to display   Initial Impression / Assessment and Plan / ED Course  I have reviewed the triage vital signs and the nursing notes.  Pertinent labs & imaging results that were available during my care of the patient were reviewed by me and considered in my medical decision making (see chart for details).  Left ankle pain secondary to cast. Cast will be bivalved. Old records are reviewed confirming bimalleolar ankle fracture treated with open reduction and internal fixation on December 28.She is referred back to her orthopedic physician tomorrow for consideration of re-applying cast.  Final Clinical Impressions(s) / ED Diagnoses   Final diagnoses:  Left ankle pain, unspecified chronicity    New Prescriptions New Prescriptions   No medications on file     Delora Fuel, MD [id number]

## 2016-02-22 NOTE — ED Notes (Signed)
Cast removed, ace wrap applied.

## 2016-02-22 NOTE — ED Notes (Signed)
Ortho tech being called in for cast.

## 2016-02-23 ENCOUNTER — Encounter (HOSPITAL_COMMUNITY): Payer: Self-pay

## 2016-02-23 ENCOUNTER — Emergency Department (HOSPITAL_COMMUNITY)
Admission: EM | Admit: 2016-02-23 | Discharge: 2016-02-23 | Disposition: A | Discharge status: Home / Self Care | Payer: Medicaid Other | Attending: Emergency Medicine | Admitting: Emergency Medicine

## 2016-02-23 DIAGNOSIS — Z91040 Latex allergy status: Secondary | ICD-10-CM | POA: Diagnosis not present

## 2016-02-23 DIAGNOSIS — Z7982 Long term (current) use of aspirin: Secondary | ICD-10-CM | POA: Insufficient documentation

## 2016-02-23 DIAGNOSIS — I10 Essential (primary) hypertension: Secondary | ICD-10-CM | POA: Insufficient documentation

## 2016-02-23 DIAGNOSIS — X58XXXD Exposure to other specified factors, subsequent encounter: Secondary | ICD-10-CM | POA: Diagnosis not present

## 2016-02-23 DIAGNOSIS — S82842D Displaced bimalleolar fracture of left lower leg, subsequent encounter for closed fracture with routine healing: Secondary | ICD-10-CM | POA: Insufficient documentation

## 2016-02-23 DIAGNOSIS — S82892D Other fracture of left lower leg, subsequent encounter for closed fracture with routine healing: Secondary | ICD-10-CM

## 2016-02-23 NOTE — ED Notes (Signed)
Ortho Tech called to place splint

## 2016-02-23 NOTE — ED Provider Notes (Signed)
[facility] DEPT Provider Note   CSN: [id number] Arrival date & time: 02/23/16  0102     History   Chief Complaint Chief Complaint  Patient presents with  . Ankle Injury    HPI Jenna Hill is a 46 y.o. female.  46 year old female status post open reduction internal fixation of bimalleolar left ankle fracture was seen in emergency department yesterday because of pain from pressure being applied by the cast. Cast was bivalved in the emergency department and requested been made to use Ace wrap to secure the bivalved cast. Unfortunately, the cast was removed and an Ace wrap applied over her ankle. She did call her orthopedic physician but was told that she would have to return to the emergency department for cast application. She is complaining of pain in the left ankle which is worse if she tries to put any weight on it.    Ankle Injury     Past Medical History:  Diagnosis Date  . Bipolar 1 disorder ([place])   . Hypertension   . Schizophrenia [facility])     Patient Active Problem List   Diagnosis Date Noted  . 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 OSA (obstructive sleep apnea) 04/16/2015  . Obesity 04/16/2015  . Schizoaffective disorder, depressive type ([place]) 04/15/2015  . Hypertension 10/05/2012    Past Surgical History:  Procedure Laterality Date  . ANKLE ARTHROSCOPY Right   . CESAREAN SECTION    . HAND SURGERY Right   . ORIF ANKLE FRACTURE Left 01/29/2016   Procedure: OPEN REDUCTION INTERNAL FIXATION LEFT ANKLE BIMALLEOLAR FRACTURE AND SYNDESMOSIS;  Surgeon: Wylene Simmer, MD;  Location: [facility];  Service: Orthopedics;  Laterality: Left;    OB History    No data available       Home Medications    Prior to Admission medications   Medication Sig Start Date End Date Taking? Authorizing Provider  ABILIFY MAINTENA 400 MG PRSY Inject 400 mg into the skin every 30 (thirty) days. 12/18/15 12/16/15   Historical Provider, MD  aspirin EC 81 MG  tablet Take 1 tablet (81 mg total) by mouth 2 (two) times daily. 01/29/16   Wylene Simmer, MD  EPINEPHrine 0.3 mg/0.3 mL IJ SOAJ injection Inject 0.3 mLs (0.3 mg total) into the muscle daily as needed (allergic reaction). 04/23/15   Hildred Priest, MD  ESZOPICLONE 3 MG tablet Take 3 mg by mouth at bedtime. 12/16/15   Historical Provider, MD  loratadine (ALLERGY RELIEF) 10 MG tablet Take 10 mg by mouth daily.    Historical Provider, MD  QUEtiapine (SEROQUEL XR) 400 MG 24 hr tablet Take 400 mg by mouth 2 (two) times daily.    Historical Provider, MD  TRINTELLIX 10 MG TABS Take 10 mg by mouth 2 (two) times daily.  12/19/15   Historical Provider, MD    Family History History reviewed. No pertinent family history.  Social History Social History  Substance Use Topics  . Smoking status: Never Smoker  . Smokeless tobacco: Never Used  . Alcohol use Yes     Comment: occ     Allergies   Bee venom; Bretylium; Codeine; Other; Toradol [ketorolac tromethamine]; Ibuprofen; and Latex   Review of Systems Review of Systems  All other systems reviewed and are negative.    Physical Exam Updated Vital Signs BP 152/98   Pulse 101   Temp 98.3 F (36.8 C) (Oral)   Resp 18   LMP 02/17/2016 (Approximate)   SpO2 99%   Physical Exam  Nursing note and vitals reviewed.  46 year old female, resting comfortably and in no acute distress. Vital signs are Significant for hypertension and borderline tachycardia. Oxygen saturation is 99%, which is normal. Head is normocephalic and atraumatic. PERRLA, EOMI. Oropharynx is clear. Neck is nontender and supple without adenopathy or JVD. Back is nontender and there is no CVA tenderness. Lungs are clear without rales, wheezes, or rhonchi. Chest is nontender. Heart has regular rate and rhythm without murmur. Abdomen is soft, flat, nontender without masses or hepatosplenomegaly and peristalsis is normoactive. Extremities have no cyanosis or edema. Ace  bandage is on the left ankle. There is mild swelling of the ankle with the recent surgical incision which is healing appropriately. Skin is warm and dry without rash. Neurologic: Mental status is normal, cranial nerves are intact, there are no motor or sensory deficits.   ED Treatments / Results   Procedures Procedures (including critical care time) SPLINT APPLICATION Date/Time: [id number] AM Authorized by: [id number] Consent: Verbal consent obtained. Risks and benefits: risks, benefits and alternatives were discussed Consent given by: patient Splint applied by: orthopedic technician Location details: left lower leg Splint type: stirrup Supplies used: orthoglass cast padding, ace wrap Post-procedure: The splinted body part was neurovascularly unchanged following the procedure. Patient tolerance: Patient tolerated the procedure well with no immediate complications.     Medications Ordered in ED Medications - No data to display   Initial Impression / Assessment and Plan / ED Course  I have reviewed the triage vital signs and the nursing notes.  Pertinent labs & imaging results that were available during my care of the patient were reviewed by me and considered in my medical decision making (see chart for details).  Postoperative pain in the left ankle. Old records are reviewed confirming that the cast was bivalved yesterday but inadvertently discarded. She is placed in a stirrup splint and advised not to bear weight until cleared by her orthopedic surgeon. She is referred back to her orthopedic surgeon for full cast application.  Final Clinical Impressions(s) / ED Diagnoses   Final diagnoses:  Closed fracture of left ankle with routine healing, subsequent encounter    New Prescriptions New Prescriptions   No medications on file     Delora Fuel, MD [id number]

## 2016-02-23 NOTE — ED Triage Notes (Signed)
Pt complaining of L ankle pain. Pt states fractured ankle on 12/21. Pt states "I need a new cast." Pt complaining of increased pain since this AM. Pt states seen as [facility] today for same.

## 2016-02-23 NOTE — Discharge Instructions (Signed)
No weight bearing until cleared by Dr. Doran Durand. Call his office today to arrange to have a new cast applied.

## 2016-03-05 ENCOUNTER — Ambulatory Visit: Payer: Medicaid Other | Attending: Family Medicine | Admitting: Family Medicine

## 2016-03-05 ENCOUNTER — Encounter: Payer: Self-pay | Admitting: Family Medicine

## 2016-03-05 VITALS — BP 170/109 | HR 118 | Temp 98.7°F | Resp 18 | Ht 63.0 in | Wt 237.6 lb

## 2016-03-05 DIAGNOSIS — Z79899 Other long term (current) drug therapy: Secondary | ICD-10-CM | POA: Diagnosis not present

## 2016-03-05 DIAGNOSIS — I10 Essential (primary) hypertension: Secondary | ICD-10-CM | POA: Insufficient documentation

## 2016-03-05 DIAGNOSIS — Z9181 History of falling: Secondary | ICD-10-CM | POA: Insufficient documentation

## 2016-03-05 DIAGNOSIS — Z9889 Other specified postprocedural states: Secondary | ICD-10-CM | POA: Insufficient documentation

## 2016-03-05 DIAGNOSIS — Z8781 Personal history of (healed) traumatic fracture: Secondary | ICD-10-CM | POA: Diagnosis not present

## 2016-03-05 DIAGNOSIS — Z7982 Long term (current) use of aspirin: Secondary | ICD-10-CM | POA: Diagnosis not present

## 2016-03-05 DIAGNOSIS — M25572 Pain in left ankle and joints of left foot: Secondary | ICD-10-CM | POA: Diagnosis not present

## 2016-03-05 LAB — BASIC METABOLIC PANEL WITH GFR
BUN: 11 mg/dL (ref 7–25)
CALCIUM: 9.8 mg/dL (ref 8.6–10.2)
CO2: 26 mmol/L (ref 20–31)
CREATININE: 0.79 mg/dL (ref 0.50–1.10)
Chloride: 101 mmol/L (ref 98–110)
GFR, Est African American: >89 mL/min (ref >=60)
GFR, Est Non African American: >89 mL/min (ref >=60)
Glucose, Bld: 88 mg/dL (ref 65–99)
Potassium: 3.7 mmol/L (ref 3.5–5.3)
SODIUM: 138 mmol/L (ref 135–146)

## 2016-03-05 MED ORDER — AMLODIPINE BESYLATE 5 MG PO TABS: 5.0000 mg | ORAL_TABLET | Freq: Every day | ORAL | 2 refills | Status: DC

## 2016-03-05 MED ORDER — CLONIDINE HCL 0.2 MG PO TABS
0.1000 mg | ORAL_TABLET | Freq: Once | ORAL | Status: AC
  Administered 2016-03-05: 0.1 mg via ORAL

## 2016-03-05 MED ORDER — ACETAMINOPHEN 500 MG PO TABS: 1000.0000 mg | ORAL_TABLET | Freq: Four times a day (QID) | ORAL | 0 refills | Status: DC | PRN

## 2016-03-05 MED ORDER — HYDROCHLOROTHIAZIDE 12.5 MG PO TABS: 25.0000 mg | ORAL_TABLET | Freq: Every day | ORAL | 2 refills | Status: DC

## 2016-03-05 NOTE — Patient Instructions (Addendum)
Schedule appointment for BP check with clinical pharmacist.   Hypertension Hypertension is another name for high blood pressure. High blood pressure forces your heart to work harder to pump blood. A blood pressure reading has two numbers, which includes a higher number over a lower number (example: 110/72). Follow these instructions at home:  Have your blood pressure rechecked by your doctor.  Only take medicine as told by your doctor. Follow the directions carefully. The medicine does not work as well if you skip doses. Skipping doses also puts you at risk for problems.  Do not smoke.  Monitor your blood pressure at home as told by your doctor. Contact a doctor if:  You think you are having a reaction to the medicine you are taking.  You have repeat headaches or feel dizzy.  You have puffiness (swelling) in your ankles.  You have trouble with your vision. Get help right away if:  You get a very bad headache and are confused.  You feel weak, numb, or faint.  You get chest or belly (abdominal) pain.  You throw up (vomit).  You cannot breathe very well. This information is not intended to replace advice given to you by your health care provider. Make sure you discuss any questions you have with your health care provider. Document Released: 07/07/2007 Document Revised: 06/26/2015 Document Reviewed: 11/10/2012 Elsevier Interactive Patient Education  2017 [employer].

## 2016-03-05 NOTE — Progress Notes (Signed)
Subjective:  Patient ID: Jenna Hill, female    DOB: 01/21/1971  Age: 46 y.o. MRN: [id number]  CC: Hypertension   HPI Jenna Hill presents To establish care for hypertension. She denies any chest pain shortness of breath or swelling of the bilateral lower extremities. She reports falling up the stairs on December 21. She also has recent history of a ORIF repair to the left ankle on December 28. She is requesting a referral to [facility]. Cast in place. She denies any tingling, numbness, or coolness of the extremity. She does report moderate to severe pain, space she declines narcotic pain medication. She also reports history of psychiatric conditions. Reports receiving counseling and is on medications. She denies any suicidal or homicidal ideations.    Outpatient Medications Prior to Visit  Medication Sig Dispense Refill  . ABILIFY MAINTENA 400 MG PRSY Inject 400 mg into the skin every 30 (thirty) days. 12/18/15  1  . aspirin EC 81 MG tablet Take 1 tablet (81 mg total) by mouth 2 (two) times daily. 90 tablet 0  . EPINEPHrine 0.3 mg/0.3 mL IJ SOAJ injection Inject 0.3 mLs (0.3 mg total) into the muscle daily as needed (allergic reaction). 1 Device 0  . ESZOPICLONE 3 MG tablet Take 3 mg by mouth at bedtime.  0  . loratadine (ALLERGY RELIEF) 10 MG tablet Take 10 mg by mouth daily.    . QUEtiapine (SEROQUEL XR) 400 MG 24 hr tablet Take 400 mg by mouth 2 (two) times daily.    . TRINTELLIX 10 MG TABS Take 10 mg by mouth 2 (two) times daily.   2   No facility-administered medications prior to visit.     ROS Review of Systems  Eyes: Negative.   Respiratory: Negative.   Cardiovascular: Negative.   Gastrointestinal: Negative.   Musculoskeletal: Positive for arthralgias (History of ORIF repair).    Objective:  BP (!) 170/109 (BP Location: Left Arm, Patient Position: Sitting, Cuff Size: Normal)   Pulse (!) 118   Temp 98.7 F (37.1 C) (Oral)   Resp 18   Ht 5\' 3"  (1.6 m)    Wt 237 lb 9.6 oz (107.8 kg)   LMP 02/17/2016 (Approximate)   SpO2 94%   BMI 42.09 kg/m   BP/Weight 03/05/2016 02/23/2016 [id number]  Systolic BP 123XX123 123456 123XX123  Diastolic BP 0000000 97 78  Wt. (Lbs) 237.6 - 183  BMI 42.09 - 32.42  Some encounter information is confidential and restricted. Go to Review Flowsheets activity to see all data.   Physical Exam  Eyes: Conjunctivae and EOM are normal. Pupils are equal, round, and reactive to light.  Cardiovascular: Normal rate, regular rhythm, normal heart sounds and intact distal pulses.   Pulmonary/Chest: Effort normal and breath sounds normal.  Abdominal: Soft. Bowel sounds are normal.  Musculoskeletal:       Left ankle: She exhibits decreased range of motion (Decrease weight bearing due to fracture.).  Skin: Skin is warm and dry.  Capillary refill to the left foot is within normal limits., Dorsalis pedis pulse intact.  Psychiatric: She has a normal mood and affect. Her speech is normal. She is slowed. She expresses no suicidal plans and no homicidal plans.  Nursing note and vitals reviewed.   Assessment & Plan:   Problem List Items Addressed This Visit      Cardiovascular and Mediastinum   Hypertension   Relevant Medications   hydrochlorothiazide (HYDRODIURIL) 12.5 MG tablet   amLODipine (NORVASC) 5 MG tablet   cloNIDine (  CATAPRES) tablet 0.1 mg (Completed)   Other Relevant Orders   Microalbumin/Creatinine Ratio, Urine (Completed)   BASIC METABOLIC PANEL WITH GFR (Completed)    Other Visit Diagnoses    History of open reduction and internal fixation (ORIF) procedure    -  Primary   Relevant Orders   AMB referral to orthopedics   History of fracture due to fall       Relevant Medications   acetaminophen (TYLENOL) 500 MG tablet   Other Relevant Orders   AMB referral to orthopedics   Acute left ankle pain       Relevant Medications   acetaminophen (TYLENOL) 500 MG tablet   Other Relevant Orders   AMB referral to orthopedics       Meds ordered this encounter  Medications  . hydrochlorothiazide (HYDRODIURIL) 12.5 MG tablet    Sig: Take 2 tablets (25 mg total) by mouth daily.    Dispense:  30 tablet    Refill:  2    Order Specific Question:   Supervising Provider    Answer:   Tresa Garter [id number]  . amLODipine (NORVASC) 5 MG tablet    Sig: Take 1 tablet (5 mg total) by mouth daily.    Dispense:  30 tablet    Refill:  2    Order Specific Question:   Supervising Provider    Answer:   Tresa Garter [id number]  . acetaminophen (TYLENOL) 500 MG tablet    Sig: Take 2 tablets (1,000 mg total) by mouth every 6 (six) hours as needed for mild pain or moderate pain.    Dispense:  60 tablet    Refill:  0    Order Specific Question:   Supervising Provider    Answer:   Tresa Garter [id number]  . cloNIDine (CATAPRES) tablet 0.1 mg    Follow-up: Return in about 1 week (around 03/12/2016) for BP check with clinical pharmacist .   Alfonse Spruce FNP

## 2016-03-05 NOTE — Progress Notes (Signed)
Patient is here for estab care  Patient is here for HTN  Patient also had surgery on foot on 12/28 she slip   Patient complains foot pain, pain level a 8  Patient has eaten today & has taking meds today  Patient declined the flu shot today

## 2016-03-06 LAB — MICROALBUMIN / CREATININE URINE RATIO
CREATININE, URINE: 337 mg/dL — AB (ref 20–320)
MICROALB UR: 3.3 mg/dL
Microalb Creat Ratio: 10 mcg/mg creat (ref <30)

## 2016-03-10 ENCOUNTER — Telehealth: Payer: Self-pay

## 2016-03-10 NOTE — Telephone Encounter (Signed)
CMA call to go over results  Patient did not answer but left a VM stating the results and to call us back if she has any questions

## 2016-03-10 NOTE — Telephone Encounter (Signed)
-----   Message from Alfonse Spruce, [place] sent at 03/09/2016  6:29 PM EST ----- Kidney function is normal.  Take medications for blood pressure as prescribed.

## 2016-05-01 ENCOUNTER — Emergency Department (HOSPITAL_COMMUNITY): Payer: Medicaid Other

## 2016-05-01 ENCOUNTER — Encounter (HOSPITAL_COMMUNITY): Payer: Self-pay | Admitting: Vascular Surgery

## 2016-05-01 ENCOUNTER — Emergency Department (HOSPITAL_COMMUNITY)
Admission: EM | Admit: 2016-05-01 | Discharge: 2016-05-02 | Disposition: A | Discharge status: Home / Self Care | Payer: Medicaid Other | Attending: Emergency Medicine | Admitting: Emergency Medicine

## 2016-05-01 DIAGNOSIS — Z7982 Long term (current) use of aspirin: Secondary | ICD-10-CM | POA: Diagnosis not present

## 2016-05-01 DIAGNOSIS — Z91040 Latex allergy status: Secondary | ICD-10-CM | POA: Diagnosis not present

## 2016-05-01 DIAGNOSIS — M25562 Pain in left knee: Secondary | ICD-10-CM | POA: Insufficient documentation

## 2016-05-01 DIAGNOSIS — I10 Essential (primary) hypertension: Secondary | ICD-10-CM | POA: Diagnosis not present

## 2016-05-01 MED ORDER — ACETAMINOPHEN 500 MG PO TABS
1000.0000 mg | ORAL_TABLET | Freq: Once | ORAL | Status: AC
  Administered 2016-05-01: 1000 mg via ORAL
  Filled 2016-05-01: qty 2

## 2016-05-01 NOTE — ED Triage Notes (Signed)
Pt reports to the ED for eval of left knee pain. She states that she fx her left ankle (in Dec 17) and she states that she also sprained her knee at that time. She also states that she has been having problems with her knee popping, hyperextending, and increased pain since the injury but states that it has gotten worse in the past few days. She has taken Ibuprofen at home with minimal relief in her pain. She is followed by [facility]

## 2016-05-01 NOTE — ED Notes (Signed)
Patient transported to X-ray 

## 2016-05-01 NOTE — ED Notes (Signed)
Pt stable, understands discharge instructions, and reasons for return.   

## 2016-05-01 NOTE — ED Notes (Addendum)
Pt c/o left knee pain. States the knee is popping x1 week. See providers assessment.

## 2016-05-01 NOTE — Discharge Instructions (Signed)
Your knee x-ray was negative for bony injuries.  It did show mild degenerative changes to your knee. Please see below for results. Your knee pain could be from age related arthritis and bone changes.  I recommend follow up with orthopedics to rule out soft tissue injury, possibly with an MRI. FINDINGS:  There is no evidence of fracture or dislocation. The joint spaces  are preserved. Marginal osteophyte formation is noted at the medial  compartment; the patellofemoral joint is grossly unremarkable in  appearance. A fabella is noted.     No significant joint effusion is seen. The visualized soft tissues  are normal in appearance.     IMPRESSION:  1. No evidence of fracture or dislocation.  2. Minimal degenerative change at the left knee.   Please take 1000 mg of tylenol every 8 hours.  Place ice over your knee.  Contact your orthopedist and make an appointment for further evaluation and possible MRI for further treatment of your knee pain.   Return to the ED if you notice weakness, numbness or tingling in your left foot or swelling, redness, warmth to your knee

## 2016-05-02 NOTE — ED Provider Notes (Signed)
[facility] DEPT Provider Note   CSN: [id number] Arrival date & time: 05/01/16  2017  History   Chief Complaint Chief Complaint  Patient presents with  . Ankle Pain    HPI Jenna Hill is a 46 y.o. female with pertinent surgical hx ORIF of left ankle s/p bimalleolar fracture on December 2017 presents to ED with left knee swelling, popping sensation, posterior knee pain and hyperextension x 1 week.  No preceding trauma or falls.  Patient notes she once sprained her left knee and has received one steroid injection in left knee in the past for pain.  Patient has taken naproxen for pain with some relief.  No n/t/w in lower extremities. No calf tenderness.  No LE edema. No h/o gout. No IVDU. No recent fevers. Patient notes her left ankle is healing well, wears boot with WBAT.  Patient followed by [facility].  HPI  Past Medical History:  Diagnosis Date  . Bipolar 1 disorder ([place])   . Hypertension   . Schizophrenia [facility])     Patient Active Problem List   Diagnosis Date Noted  . Pituitary microadenoma ([place]) 04/25/2015  . OSA (obstructive sleep apnea) 04/16/2015  . Obesity 04/16/2015  . Schizoaffective disorder, depressive type ([place]) 04/15/2015  . Hypertension 10/05/2012    Past Surgical History:  Procedure Laterality Date  . ANKLE ARTHROSCOPY Right   . CESAREAN SECTION    . HAND SURGERY Right   . ORIF ANKLE FRACTURE Left 01/29/2016   Procedure: OPEN REDUCTION INTERNAL FIXATION LEFT ANKLE BIMALLEOLAR FRACTURE AND SYNDESMOSIS;  Surgeon: Wylene Simmer, MD;  Location: [facility];  Service: Orthopedics;  Laterality: Left;    OB History    No data available       Home Medications    Prior to Admission medications   Medication Sig Start Date End Date Taking? Authorizing Provider  ABILIFY MAINTENA 400 MG PRSY Inject 400 mg into the skin every 30 (thirty) days. 12/18/15 12/16/15   Historical Provider, MD  acetaminophen (TYLENOL) 500 MG tablet Take 2 tablets (1,000  mg total) by mouth every 6 (six) hours as needed for mild pain or moderate pain. 03/05/16   Alfonse Spruce, FNP  amLODipine (NORVASC) 5 MG tablet Take 1 tablet (5 mg total) by mouth daily. 03/05/16   Alfonse Spruce, FNP  aspirin EC 81 MG tablet Take 1 tablet (81 mg total) by mouth 2 (two) times daily. 01/29/16   Wylene Simmer, MD  EPINEPHrine 0.3 mg/0.3 mL IJ SOAJ injection Inject 0.3 mLs (0.3 mg total) into the muscle daily as needed (allergic reaction). 04/23/15   Hildred Priest, MD  ESZOPICLONE 3 MG tablet Take 3 mg by mouth at bedtime. 12/16/15   Historical Provider, MD  hydrochlorothiazide (HYDRODIURIL) 12.5 MG tablet Take 2 tablets (25 mg total) by mouth daily. 03/05/16   Alfonse Spruce, FNP  loratadine (ALLERGY RELIEF) 10 MG tablet Take 10 mg by mouth daily.    Historical Provider, MD  QUEtiapine (SEROQUEL XR) 400 MG 24 hr tablet Take 400 mg by mouth 2 (two) times daily.    Historical Provider, MD  TRINTELLIX 10 MG TABS Take 10 mg by mouth 2 (two) times daily.  12/19/15   Historical Provider, MD    Family History No family history on file.  Social History Social History  Substance Use Topics  . Smoking status: Never Smoker  . Smokeless tobacco: Never Used  . Alcohol use Yes     Comment: occ  Allergies   Bee venom; Bretylium; Codeine; Other; Toradol [ketorolac tromethamine]; Ibuprofen; and Latex   Review of Systems Review of Systems  Constitutional: Negative for fever.  Respiratory: Negative for shortness of breath.   Cardiovascular: Negative for chest pain.  Gastrointestinal: Negative for nausea and vomiting.  Musculoskeletal: Positive for arthralgias, gait problem and joint swelling.  Skin: Negative for color change and wound.     Physical Exam Updated Vital Signs BP 132/72 (BP Location: Right Arm)   Pulse 98   Temp 98.7 F (37.1 C) (Oral)   Resp 16   Ht 5\' 4"  (1.626 m)   Wt 97.5 kg   SpO2 97%   BMI 36.90 kg/m   Physical Exam    Constitutional: She is oriented to person, place, and time. She appears well-developed and well-nourished. She is cooperative. No distress.  HENT:  Head: Normocephalic and atraumatic.  Nose: Nose normal.  Eyes: Conjunctivae and EOM are normal. No scleral icterus.  Cardiovascular: Normal rate, regular rhythm, normal heart sounds and intact distal pulses.   No murmur heard. Pulmonary/Chest: Effort normal and breath sounds normal. She has no wheezes.  Musculoskeletal: Normal range of motion. She exhibits no deformity.       Left knee: She exhibits swelling and bony tenderness. Tenderness found. Medial joint line and MCL tenderness noted.  +Mild anterior and medial knee edema and tenderness without erythema or warmth.0 +Point tenderness at patella and MCL and medial meniscus  +McMurray's with pain reported on medial aspect of knee.  +Crepitus with knee flexion.  Full passive ROM of left knee with normal patellar J tracking  No lateral joint line tenderness.   No bony tenderness fibular head or tibial tuberosity.   No tenderness over LCL, patellar tendon or quadriceps tendon.    Negative Lachman's. Negative posterior drawer test. Negative ballottement test. No varus or valgus laxity.  Patient able to bear weight in ED (4+ steps)  Neurological: She is alert and oriented to person, place, and time.  5/5 strength with knee flexion and extension, bilaterally.  5/5 strength with ankle dorsiflexion and plantar flexion, bilaterally.  Sensation to light touch intact in the distribution of the obturator nerve, lateral cutaneous nerve, femoral nerve, common fibular nerve.  2/4 knee DTR bilaterally.   Foot: sensation to light touch intact in the distribution of the saphenous nerve, medial plantar nerve, lateral plantar nerve, bilaterally.   Skin: Skin is warm and dry. Capillary refill takes less than 2 seconds.  Psychiatric: She has a normal mood and affect. Her behavior is normal. Judgment and thought  content normal.  Nursing note and vitals reviewed.    ED Treatments / Results  Labs (all labs ordered are listed, but only abnormal results are displayed) Labs Reviewed - No data to display  EKG  EKG Interpretation None       Radiology Dg Knee Complete 4 Views Left  Result Date: 05/01/2016 CLINICAL DATA:  Acute onset of left knee pain and tenderness. Initial encounter. EXAM: LEFT KNEE - COMPLETE 4+ VIEW COMPARISON:  Left knee radiographs performed 04/05/2015 FINDINGS: There is no evidence of fracture or dislocation. The joint spaces are preserved. Marginal osteophyte formation is noted at the medial compartment; the patellofemoral joint is grossly unremarkable in appearance. A fabella is noted. No significant joint effusion is seen. The visualized soft tissues are normal in appearance. IMPRESSION: 1. No evidence of fracture or dislocation. 2. Minimal degenerative change at the left knee. Electronically Signed   By: Francoise Schaumann.D.  On: 05/01/2016 23:35    Procedures Procedures (including critical care time)  Medications Ordered in ED Medications  acetaminophen (TYLENOL) tablet 1,000 mg (1,000 mg Oral Given 05/01/16 2237)     Initial Impression / Assessment and Plan / ED Course  I have reviewed the triage vital signs and the nursing notes.  Pertinent labs & imaging results that were available during my care of the patient were reviewed by me and considered in my medical decision making (see chart for details).  Clinical Course as of May 02 228  Sat May 01, 2016  2342 FINDINGS: There is no evidence of fracture or dislocation. The joint spaces are preserved. Marginal osteophyte formation is noted at the medial compartment; the patellofemoral joint is grossly unremarkable in appearance. A fabella is noted.  No significant joint effusion is seen. The visualized soft tissues are normal in appearance.  IMPRESSION: 1. No evidence of fracture or dislocation. 2. Minimal  degenerative change at the left knee. DG Knee Complete 4 Views Left [CG]    Clinical Course User Index [CG] Kinnie Feil, PA-C   Suspect arthritic pain, possibly ligamentous or meniscal injury.  Patient does have left ankle injury and has been walking with left ankle boot, it is possible she is overcompensating and changing her gait causing more stress on left knee.  X-ray negative for acute bony injury. Minimal degenerative changes and fabella seen.  Neurovascularly intact LLE.  No further emergent imaging indicated at this time.  Patient advised to take tylenol, ice and f/u with ortho for further evaluation, possibly MRI or another steroid injection which was successful previously for knee pain.  Patient agreeable to plan. Patient left in good condition with all questions and answers addressed.   Final Clinical Impressions(s) / ED Diagnoses   Final diagnoses:  Acute pain of left knee    New Prescriptions Discharge Medication List as of 05/01/2016 11:48 PM       Kinnie Feil, PA-C 05/02/16 0230    Pattricia Boss, MD 05/04/16 2018

## 2017-04-05 ENCOUNTER — Emergency Department (HOSPITAL_COMMUNITY): Payer: Medicaid Other

## 2017-04-05 ENCOUNTER — Inpatient Hospital Stay (HOSPITAL_COMMUNITY)
Admission: EM | Admit: 2017-04-05 | Discharge: 2017-04-29 | DRG: 871 | Disposition: A | Discharge status: Rehabilitation Facility | Payer: Medicaid Other | Attending: Family Medicine | Admitting: Family Medicine

## 2017-04-05 ENCOUNTER — Inpatient Hospital Stay (HOSPITAL_COMMUNITY): Payer: Medicaid Other

## 2017-04-05 DIAGNOSIS — R52 Pain, unspecified: Secondary | ICD-10-CM

## 2017-04-05 DIAGNOSIS — I509 Heart failure, unspecified: Secondary | ICD-10-CM | POA: Diagnosis present

## 2017-04-05 DIAGNOSIS — I10 Essential (primary) hypertension: Secondary | ICD-10-CM | POA: Diagnosis present

## 2017-04-05 DIAGNOSIS — F251 Schizoaffective disorder, depressive type: Secondary | ICD-10-CM | POA: Diagnosis present

## 2017-04-05 DIAGNOSIS — R450 Nervousness: Secondary | ICD-10-CM | POA: Diagnosis not present

## 2017-04-05 DIAGNOSIS — K859 Acute pancreatitis without necrosis or infection, unspecified: Secondary | ICD-10-CM | POA: Diagnosis present

## 2017-04-05 DIAGNOSIS — M6282 Rhabdomyolysis: Secondary | ICD-10-CM | POA: Diagnosis present

## 2017-04-05 DIAGNOSIS — E162 Hypoglycemia, unspecified: Secondary | ICD-10-CM | POA: Diagnosis not present

## 2017-04-05 DIAGNOSIS — E871 Hypo-osmolality and hyponatremia: Secondary | ICD-10-CM | POA: Diagnosis not present

## 2017-04-05 DIAGNOSIS — E876 Hypokalemia: Secondary | ICD-10-CM | POA: Diagnosis present

## 2017-04-05 DIAGNOSIS — M21371 Foot drop, right foot: Secondary | ICD-10-CM | POA: Diagnosis present

## 2017-04-05 DIAGNOSIS — Z915 Personal history of self-harm: Secondary | ICD-10-CM

## 2017-04-05 DIAGNOSIS — M7989 Other specified soft tissue disorders: Secondary | ICD-10-CM | POA: Diagnosis not present

## 2017-04-05 DIAGNOSIS — R680 Hypothermia, not associated with low environmental temperature: Secondary | ICD-10-CM | POA: Diagnosis present

## 2017-04-05 DIAGNOSIS — J9601 Acute respiratory failure with hypoxia: Secondary | ICD-10-CM | POA: Diagnosis not present

## 2017-04-05 DIAGNOSIS — N179 Acute kidney failure, unspecified: Secondary | ICD-10-CM

## 2017-04-05 DIAGNOSIS — J9600 Acute respiratory failure, unspecified whether with hypoxia or hypercapnia: Secondary | ICD-10-CM

## 2017-04-05 DIAGNOSIS — N170 Acute kidney failure with tubular necrosis: Secondary | ICD-10-CM | POA: Diagnosis present

## 2017-04-05 DIAGNOSIS — J690 Pneumonitis due to inhalation of food and vomit: Secondary | ICD-10-CM | POA: Diagnosis present

## 2017-04-05 DIAGNOSIS — R10819 Abdominal tenderness, unspecified site: Secondary | ICD-10-CM

## 2017-04-05 DIAGNOSIS — K7200 Acute and subacute hepatic failure without coma: Secondary | ICD-10-CM | POA: Diagnosis present

## 2017-04-05 DIAGNOSIS — Z62810 Personal history of physical and sexual abuse in childhood: Secondary | ICD-10-CM | POA: Diagnosis present

## 2017-04-05 DIAGNOSIS — A419 Sepsis, unspecified organism: Secondary | ICD-10-CM | POA: Diagnosis present

## 2017-04-05 DIAGNOSIS — Z452 Encounter for adjustment and management of vascular access device: Secondary | ICD-10-CM

## 2017-04-05 DIAGNOSIS — D638 Anemia in other chronic diseases classified elsewhere: Secondary | ICD-10-CM | POA: Diagnosis present

## 2017-04-05 DIAGNOSIS — R748 Abnormal levels of other serum enzymes: Secondary | ICD-10-CM

## 2017-04-05 DIAGNOSIS — R739 Hyperglycemia, unspecified: Secondary | ICD-10-CM | POA: Diagnosis present

## 2017-04-05 DIAGNOSIS — F250 Schizoaffective disorder, bipolar type: Secondary | ICD-10-CM | POA: Diagnosis present

## 2017-04-05 DIAGNOSIS — R140 Abdominal distension (gaseous): Secondary | ICD-10-CM

## 2017-04-05 DIAGNOSIS — T50904A Poisoning by unspecified drugs, medicaments and biological substances, undetermined, initial encounter: Secondary | ICD-10-CM

## 2017-04-05 DIAGNOSIS — T796XXD Traumatic ischemia of muscle, subsequent encounter: Secondary | ICD-10-CM | POA: Diagnosis not present

## 2017-04-05 DIAGNOSIS — S7401XS Injury of sciatic nerve at hip and thigh level, right leg, sequela: Secondary | ICD-10-CM | POA: Diagnosis not present

## 2017-04-05 DIAGNOSIS — Z7982 Long term (current) use of aspirin: Secondary | ICD-10-CM | POA: Diagnosis not present

## 2017-04-05 DIAGNOSIS — T424X1A Poisoning by benzodiazepines, accidental (unintentional), initial encounter: Secondary | ICD-10-CM | POA: Diagnosis present

## 2017-04-05 DIAGNOSIS — F319 Bipolar disorder, unspecified: Secondary | ICD-10-CM | POA: Diagnosis present

## 2017-04-05 DIAGNOSIS — G9341 Metabolic encephalopathy: Secondary | ICD-10-CM | POA: Diagnosis present

## 2017-04-05 DIAGNOSIS — D72829 Elevated white blood cell count, unspecified: Secondary | ICD-10-CM | POA: Diagnosis not present

## 2017-04-05 DIAGNOSIS — S7401XD Injury of sciatic nerve at hip and thigh level, right leg, subsequent encounter: Secondary | ICD-10-CM | POA: Diagnosis not present

## 2017-04-05 DIAGNOSIS — Z818 Family history of other mental and behavioral disorders: Secondary | ICD-10-CM

## 2017-04-05 DIAGNOSIS — D72823 Leukemoid reaction: Secondary | ICD-10-CM | POA: Diagnosis not present

## 2017-04-05 DIAGNOSIS — E872 Acidosis: Secondary | ICD-10-CM | POA: Diagnosis present

## 2017-04-05 DIAGNOSIS — G9340 Encephalopathy, unspecified: Secondary | ICD-10-CM | POA: Diagnosis not present

## 2017-04-05 DIAGNOSIS — R112 Nausea with vomiting, unspecified: Secondary | ICD-10-CM | POA: Diagnosis not present

## 2017-04-05 DIAGNOSIS — I110 Hypertensive heart disease with heart failure: Secondary | ICD-10-CM | POA: Diagnosis present

## 2017-04-05 DIAGNOSIS — Z6841 Body Mass Index (BMI) 40.0 and over, adult: Secondary | ICD-10-CM | POA: Diagnosis not present

## 2017-04-05 DIAGNOSIS — G4700 Insomnia, unspecified: Secondary | ICD-10-CM | POA: Diagnosis not present

## 2017-04-05 DIAGNOSIS — F419 Anxiety disorder, unspecified: Secondary | ICD-10-CM | POA: Diagnosis not present

## 2017-04-05 DIAGNOSIS — Z79899 Other long term (current) drug therapy: Secondary | ICD-10-CM | POA: Diagnosis not present

## 2017-04-05 DIAGNOSIS — E781 Pure hyperglyceridemia: Secondary | ICD-10-CM | POA: Diagnosis present

## 2017-04-05 DIAGNOSIS — M25551 Pain in right hip: Secondary | ICD-10-CM | POA: Diagnosis not present

## 2017-04-05 DIAGNOSIS — T796XXS Traumatic ischemia of muscle, sequela: Secondary | ICD-10-CM | POA: Diagnosis not present

## 2017-04-05 DIAGNOSIS — I517 Cardiomegaly: Secondary | ICD-10-CM | POA: Diagnosis not present

## 2017-04-05 DIAGNOSIS — K8020 Calculus of gallbladder without cholecystitis without obstruction: Secondary | ICD-10-CM | POA: Diagnosis present

## 2017-04-05 LAB — LIPASE, BLOOD: Lipase: 207 U/L — ABNORMAL HIGH (ref 11–51)

## 2017-04-05 LAB — I-STAT CG4 LACTIC ACID, ED: Lactic Acid, Venous: 10.54 mmol/L (ref 0.5–1.9)

## 2017-04-05 LAB — URINALYSIS, ROUTINE W REFLEX MICROSCOPIC
Bilirubin Urine: NEGATIVE
GLUCOSE, UA: 150 mg/dL — AB
HGB URINE DIPSTICK: NEGATIVE
Ketones, ur: 5 mg/dL — AB
Leukocytes, UA: NEGATIVE
NITRITE: NEGATIVE
PROTEIN: 30 mg/dL — AB
Specific Gravity, Urine: 1.021 (ref 1.005–1.030)
pH: 5 (ref 5.0–8.0)

## 2017-04-05 LAB — HEMOGLOBIN A1C
Hgb A1c MFr Bld: 6.9 % — ABNORMAL HIGH (ref 4.8–5.6)
MEAN PLASMA GLUCOSE: 151.33 mg/dL

## 2017-04-05 LAB — CBC
HCT: 44.9 % (ref 36.0–46.0)
HEMOGLOBIN: 15.6 g/dL — AB (ref 12.0–15.0)
MCH: 30.1 pg (ref 26.0–34.0)
MCHC: 34.7 g/dL (ref 30.0–36.0)
MCV: 86.5 fL (ref 78.0–100.0)
PLATELETS: 268 10*3/uL (ref 150–400)
RBC: 5.19 MIL/uL — ABNORMAL HIGH (ref 3.87–5.11)
RDW: 13.3 % (ref 11.5–15.5)
WBC: 11.8 10*3/uL — ABNORMAL HIGH (ref 4.0–10.5)

## 2017-04-05 LAB — COMPREHENSIVE METABOLIC PANEL
ALBUMIN: 3.8 g/dL (ref 3.5–5.0)
ALT: 184 U/L — ABNORMAL HIGH (ref 14–54)
ANION GAP: 22 — AB (ref 5–15)
AST: 349 U/L — ABNORMAL HIGH (ref 15–41)
Alkaline Phosphatase: 41 U/L (ref 38–126)
BUN: 21 mg/dL — ABNORMAL HIGH (ref 6–20)
CHLORIDE: 101 mmol/L (ref 101–111)
CO2: 15 mmol/L — AB (ref 22–32)
Calcium: 8.6 mg/dL — ABNORMAL LOW (ref 8.9–10.3)
Creatinine, Ser: 2.01 mg/dL — ABNORMAL HIGH (ref 0.44–1.00)
GFR calc Af Amer: 33 mL/min — ABNORMAL LOW (ref >60)
GFR calc non Af Amer: 29 mL/min — ABNORMAL LOW (ref >60)
GLUCOSE: 322 mg/dL — AB (ref 65–99)
POTASSIUM: 2.7 mmol/L — AB (ref 3.5–5.1)
Sodium: 138 mmol/L (ref 135–145)
Total Bilirubin: 0.6 mg/dL (ref 0.3–1.2)
Total Protein: 7.6 g/dL (ref 6.5–8.1)

## 2017-04-05 LAB — RAPID URINE DRUG SCREEN, HOSP PERFORMED
Amphetamines: NOT DETECTED
Barbiturates: NOT DETECTED
Benzodiazepines: NOT DETECTED
Cocaine: NOT DETECTED
OPIATES: NOT DETECTED
Tetrahydrocannabinol: NOT DETECTED

## 2017-04-05 LAB — I-STAT CHEM 8, ED
BUN: 23 mg/dL — ABNORMAL HIGH (ref 6–20)
Calcium, Ion: 1.07 mmol/L — ABNORMAL LOW (ref 1.15–1.40)
Chloride: 101 mmol/L (ref 101–111)
Creatinine, Ser: 2 mg/dL — ABNORMAL HIGH (ref 0.44–1.00)
Glucose, Bld: 317 mg/dL — ABNORMAL HIGH (ref 65–99)
HEMATOCRIT: 47 % — AB (ref 36.0–46.0)
HEMOGLOBIN: 16 g/dL — AB (ref 12.0–15.0)
POTASSIUM: 2.7 mmol/L — AB (ref 3.5–5.1)
SODIUM: 140 mmol/L (ref 135–145)
TCO2: 18 mmol/L — ABNORMAL LOW (ref 22–32)

## 2017-04-05 LAB — GLUCOSE, CAPILLARY
GLUCOSE-CAPILLARY: 218 mg/dL — AB (ref 65–99)
GLUCOSE-CAPILLARY: 175 mg/dL — AB (ref 65–99)

## 2017-04-05 LAB — MAGNESIUM: Magnesium: 2.2 mg/dL (ref 1.7–2.4)

## 2017-04-05 LAB — LACTIC ACID, PLASMA
Lactic Acid, Venous: 7 mmol/L (ref 0.5–1.9)
Lactic Acid, Venous: 4.2 mmol/L (ref 0.5–1.9)

## 2017-04-05 LAB — CK: Total CK: >50000 U/L — ABNORMAL HIGH (ref 38–234)

## 2017-04-05 LAB — I-STAT BETA HCG BLOOD, ED (MC, WL, AP ONLY): I-stat hCG, quantitative: <5 m[IU]/mL (ref <5)

## 2017-04-05 LAB — ETHANOL

## 2017-04-05 LAB — TRIGLYCERIDES: Triglycerides: 73 mg/dL (ref <150)

## 2017-04-05 LAB — BRAIN NATRIURETIC PEPTIDE: B NATRIURETIC PEPTIDE 5: 36.9 pg/mL (ref 0.0–100.0)

## 2017-04-05 LAB — MRSA PCR SCREENING: MRSA BY PCR: NEGATIVE

## 2017-04-05 LAB — ACETAMINOPHEN LEVEL

## 2017-04-05 LAB — SALICYLATE LEVEL: Salicylate Lvl: <7 mg/dL (ref 2.8–30.0)

## 2017-04-05 MED ORDER — SODIUM CHLORIDE 0.9 % IV SOLN: 250.0000 mL | INTRAVENOUS | Status: DC | PRN

## 2017-04-05 MED ORDER — SODIUM CHLORIDE 0.9 % IV SOLN
INTRAVENOUS | Status: DC
  Administered 2017-04-05 – 2017-04-07 (×3): via INTRAVENOUS

## 2017-04-05 MED ORDER — FENTANYL BOLUS VIA INFUSION
50.0000 ug | INTRAVENOUS | Status: DC | PRN
  Filled 2017-04-05: qty 50

## 2017-04-05 MED ORDER — PROPOFOL 1000 MG/100ML IV EMUL
0.0000 ug/kg/min | INTRAVENOUS | Status: DC
  Administered 2017-04-05: 25 ug/kg/min via INTRAVENOUS
  Administered 2017-04-05: 10 ug/kg/min via INTRAVENOUS
  Administered 2017-04-05: 5 ug/kg/min via INTRAVENOUS
  Administered 2017-04-06 (×2): 30 ug/kg/min via INTRAVENOUS
  Administered 2017-04-06: 15 ug/kg/min via INTRAVENOUS
  Administered 2017-04-06: 30 ug/kg/min via INTRAVENOUS
  Administered 2017-04-07: 40 ug/kg/min via INTRAVENOUS
  Administered 2017-04-07 (×2): 30 ug/kg/min via INTRAVENOUS
  Administered 2017-04-07 – 2017-04-08 (×3): 20 ug/kg/min via INTRAVENOUS
  Filled 2017-04-05 (×11): qty 100

## 2017-04-05 MED ORDER — ACETAMINOPHEN 325 MG PO TABS: 650.0000 mg | ORAL_TABLET | Freq: Four times a day (QID) | ORAL | Status: DC | PRN

## 2017-04-05 MED ORDER — ETOMIDATE 2 MG/ML IV SOLN
INTRAVENOUS | Status: AC | PRN
Start: 2017-04-05 — End: 2017-04-05
  Administered 2017-04-05: 30 mg via INTRAVENOUS

## 2017-04-05 MED ORDER — CHLORHEXIDINE GLUCONATE 0.12% ORAL RINSE (MEDLINE KIT)
15.0000 mL | Freq: Two times a day (BID) | OROMUCOSAL | Status: DC
  Administered 2017-04-05 – 2017-04-08 (×6): 15 mL via OROMUCOSAL

## 2017-04-05 MED ORDER — FENTANYL CITRATE (PF) 100 MCG/2ML IJ SOLN: 50.0000 ug | Freq: Once | INTRAMUSCULAR | Status: DC

## 2017-04-05 MED ORDER — SODIUM CHLORIDE 0.9 % IV SOLN
Freq: Once | INTRAVENOUS | Status: AC
  Administered 2017-04-05: 16:00:00 via INTRAVENOUS

## 2017-04-05 MED ORDER — PROPOFOL 1000 MG/100ML IV EMUL
INTRAVENOUS | Status: AC
  Administered 2017-04-05: 5 ug/kg/min via INTRAVENOUS
  Filled 2017-04-05: qty 100

## 2017-04-05 MED ORDER — HYDRALAZINE HCL 20 MG/ML IJ SOLN
10.0000 mg | INTRAMUSCULAR | Status: DC | PRN
  Administered 2017-04-11: 40 mg via INTRAVENOUS
  Filled 2017-04-05: qty 2
  Filled 2017-04-05: qty 1

## 2017-04-05 MED ORDER — FENTANYL CITRATE (PF) 100 MCG/2ML IJ SOLN
25.0000 ug | INTRAMUSCULAR | Status: DC | PRN
  Administered 2017-04-06 – 2017-04-07 (×4): 100 ug via INTRAVENOUS
  Administered 2017-04-08 (×2): 50 ug via INTRAVENOUS
  Administered 2017-04-08: 100 ug via INTRAVENOUS
  Administered 2017-04-08 – 2017-04-09 (×2): 50 ug via INTRAVENOUS
  Administered 2017-04-09: 100 ug via INTRAVENOUS
  Administered 2017-04-09: 50 ug via INTRAVENOUS
  Filled 2017-04-05 (×11): qty 2

## 2017-04-05 MED ORDER — FENTANYL 2500MCG IN NS 250ML (10MCG/ML) PREMIX INFUSION
25.0000 ug/h | INTRAVENOUS | Status: DC
  Filled 2017-04-05: qty 250

## 2017-04-05 MED ORDER — NALOXONE HCL 2 MG/2ML IJ SOSY: 1.0000 mg | PREFILLED_SYRINGE | Freq: Once | INTRAMUSCULAR | Status: DC

## 2017-04-05 MED ORDER — ONDANSETRON HCL 4 MG/2ML IJ SOLN
4.0000 mg | Freq: Four times a day (QID) | INTRAMUSCULAR | Status: DC | PRN
  Administered 2017-04-09 – 2017-04-29 (×25): 4 mg via INTRAVENOUS
  Filled 2017-04-05 (×25): qty 2

## 2017-04-05 MED ORDER — ROCURONIUM BROMIDE 50 MG/5ML IV SOLN
INTRAVENOUS | Status: AC | PRN
  Administered 2017-04-05: 100 mg via INTRAVENOUS

## 2017-04-05 MED ORDER — SODIUM CHLORIDE 0.9 % IV BOLUS (SEPSIS)
1000.0000 mL | Freq: Once | INTRAVENOUS | Status: AC
  Administered 2017-04-05: 1000 mL via INTRAVENOUS

## 2017-04-05 MED ORDER — ORAL CARE MOUTH RINSE
15.0000 mL | Freq: Four times a day (QID) | OROMUCOSAL | Status: DC
  Administered 2017-04-05 – 2017-04-07 (×8): 15 mL via OROMUCOSAL

## 2017-04-05 MED ORDER — POTASSIUM CHLORIDE 20 MEQ/15ML (10%) PO SOLN
40.0000 meq | Freq: Two times a day (BID) | ORAL | Status: AC
  Administered 2017-04-05 – 2017-04-06 (×2): 40 meq
  Filled 2017-04-05 (×3): qty 30

## 2017-04-05 MED ORDER — SODIUM CHLORIDE 0.9 % IV SOLN
3.0000 g | Freq: Four times a day (QID) | INTRAVENOUS | Status: DC
  Administered 2017-04-05 – 2017-04-06 (×3): 3 g via INTRAVENOUS
  Filled 2017-04-05 (×4): qty 3

## 2017-04-05 MED ORDER — FAMOTIDINE 40 MG/5ML PO SUSR
20.0000 mg | Freq: Two times a day (BID) | ORAL | Status: DC
  Filled 2017-04-05: qty 2.5

## 2017-04-05 MED ORDER — INSULIN ASPART 100 UNIT/ML ~~LOC~~ SOLN
0.0000 [IU] | SUBCUTANEOUS | Status: DC
  Administered 2017-04-05: 2 [IU] via SUBCUTANEOUS
  Administered 2017-04-05: 5 [IU] via SUBCUTANEOUS
  Administered 2017-04-05: 3 [IU] via SUBCUTANEOUS
  Administered 2017-04-06 – 2017-04-07 (×6): 2 [IU] via SUBCUTANEOUS
  Administered 2017-04-07: 3 [IU] via SUBCUTANEOUS
  Administered 2017-04-09 – 2017-04-10 (×2): 2 [IU] via SUBCUTANEOUS

## 2017-04-05 MED ORDER — SODIUM CHLORIDE 0.9 % IV SOLN
3.0000 g | Freq: Once | INTRAVENOUS | Status: AC
  Filled 2017-04-05: qty 3

## 2017-04-05 MED ORDER — RANITIDINE HCL 150 MG/10ML PO SYRP
150.0000 mg | ORAL_SOLUTION | Freq: Two times a day (BID) | ORAL | Status: DC
  Administered 2017-04-05 – 2017-04-17 (×24): 150 mg via ORAL
  Filled 2017-04-05 (×30): qty 10

## 2017-04-05 MED ORDER — PROPOFOL 1000 MG/100ML IV EMUL
0.0000 ug/kg/min | INTRAVENOUS | Status: DC
  Administered 2017-04-05: 5 ug/kg/min via INTRAVENOUS

## 2017-04-05 NOTE — ED Notes (Signed)
Report given to RN

## 2017-04-05 NOTE — Progress Notes (Signed)
PT transported on 100% Fi02 to CT in ED then to ICU 1238- uneventful.

## 2017-04-05 NOTE — ED Notes (Signed)
ED TO INPATIENT HANDOFF REPORT  Name/Age/Gender Jenna Hill 47 y.o. female  Code Status    Code Status Orders  (From admission, onward)        Start     Ordered   04/05/17 1558  Full code  Continuous     04/05/17 1600    Code Status History    Date Active Date Inactive Code Status Order ID Comments User Context   04/15/2015 19:56 04/24/2015 21:16 Full Code [id number]  Hildred Priest, MD Inpatient   04/14/2015 22:41 04/15/2015 19:56 Full Code [id number]  Nat Christen, MD ED   10/05/2012 09:49 10/07/2012 13:05 Full Code [id number]  Chesley Mires, MD ED      Home/SNF/Other Home  Chief Complaint Drug Overdose  Level of Care/Admitting Diagnosis ED Disposition    ED Disposition Condition Melissa: [facility] [[id number]]  Level of Care: ICU [6]  Diagnosis: Acute encephalopathy [119417]  Admitting Physician: Marshell Garfinkel [[id number]]  Attending Physician: Marshell Garfinkel [[id number]]  Estimated length of stay: 3 - 4 days  Certification:: I certify this patient will need inpatient services for at least 2 midnights  PT Class (Do Not Modify): Inpatient [101]  PT Acc Code (Do Not Modify): Private [1]       Medical History Past Medical History:  Diagnosis Date  . Bipolar 1 disorder ([place])   . Hypertension   . Schizophrenia ([place])     Allergies Allergies  Allergen Reactions  . Bee Venom Anaphylaxis  . Bretylium     Other reaction(s): Seizures  . Codeine Anaphylaxis  . Other Anaphylaxis    bertrillium or bertillium    . Toradol [Ketorolac Tromethamine] Swelling  . Ibuprofen     Raised hives   . Latex Rash    IV Location/Drains/Wounds Patient Lines/Drains/Airways Status   Active Line/Drains/Airways    Name:   Placement date:   Placement time:   Site:   Days:   Peripheral IV 04/05/17 Right Antecubital   04/05/17    1400    Antecubital   less than 1   Peripheral IV 04/05/17 Left Antecubital   04/05/17    1419     Antecubital   less than 1   NG/OG Tube Orogastric 14 Fr. Left mouth Xray;Aucultation Measured external length of tube   04/05/17    1443    Left mouth   less than 1   Urethral Catheter Jonna, NT Temperature probe   04/05/17    1417    Temperature probe   less than 1   Urethral Catheter Jonna Shirley Temperature probe 14 Fr.   04/05/17    1411    Temperature probe   less than 1   Airway 7.5 mm   04/05/17    1416     less than 1          Labs/Imaging Results for orders placed or performed during the hospital encounter of 04/05/17 (from the past 48 hour(s))  I-Stat Beta hCG blood, ED ([facility], [facility], [facility] only)     Status: None   Collection Time: 04/05/17  1:59 PM  Result Value Ref Range   I-stat hCG, quantitative <5.0 <5 mIU/mL   Comment 3            Comment:   GEST. AGE      CONC.  (mIU/mL)   <=1 WEEK        5 - 50     2 WEEKS  50 - 500     3 WEEKS       100 - 10,000     4 WEEKS     1,000 - 30,000        FEMALE AND NON-PREGNANT FEMALE:     LESS THAN 5 mIU/mL   I-Stat Chem 8, ED     Status: Abnormal   Collection Time: 04/05/17  2:00 PM  Result Value Ref Range   Sodium 140 135 - 145 mmol/L   Potassium 2.7 (LL) 3.5 - 5.1 mmol/L   Chloride 101 101 - 111 mmol/L   BUN 23 (H) 6 - 20 mg/dL   Creatinine, Ser 2.00 (H) 0.44 - 1.00 mg/dL   Glucose, Bld 317 (H) 65 - 99 mg/dL   Calcium, Ion 1.07 (L) 1.15 - 1.40 mmol/L   TCO2 18 (L) 22 - 32 mmol/L   Hemoglobin 16.0 (H) 12.0 - 15.0 g/dL   HCT 47.0 (H) 36.0 - 46.0 %   Comment NOTIFIED PHYSICIAN   I-Stat CG4 Lactic Acid, ED     Status: Abnormal   Collection Time: 04/05/17  2:01 PM  Result Value Ref Range   Lactic Acid, Venous 10.54 (HH) 0.5 - 1.9 mmol/L   Comment NOTIFIED PHYSICIAN   Urine rapid drug screen (hosp performed)     Status: None   Collection Time: 04/05/17  3:15 PM  Result Value Ref Range   Opiates NONE DETECTED NONE DETECTED   Cocaine NONE DETECTED NONE DETECTED   Benzodiazepines NONE DETECTED NONE DETECTED   Amphetamines NONE  DETECTED NONE DETECTED   Tetrahydrocannabinol NONE DETECTED NONE DETECTED   Barbiturates NONE DETECTED NONE DETECTED    Comment: (NOTE) DRUG SCREEN FOR MEDICAL PURPOSES ONLY.  IF CONFIRMATION IS NEEDED FOR ANY PURPOSE, NOTIFY LAB WITHIN 5 DAYS. LOWEST DETECTABLE LIMITS FOR URINE DRUG SCREEN Drug Class                     Cutoff (ng/mL) Amphetamine and metabolites    1000 Barbiturate and metabolites    200 Benzodiazepine                 315 Tricyclics and metabolites     300 Opiates and metabolites        300 Cocaine and metabolites        300 THC                            50 Performed at [facility], [place] [address], [place], [place] [address]    Dg Chest Port 1 View  Result Date: 04/05/2017 CLINICAL DATA:  NG placement EXAM: PORTABLE CHEST 1 VIEW COMPARISON:  10/07/2012 FINDINGS: Endotracheal tube in good position.  NG tube in the stomach. Cardiac enlargement with vascular congestion. Mild perihilar airspace disease left greater than right likely due to mild edema. No effusion IMPRESSION: Endotracheal tube in good position.  NG tube in the stomach Congestive heart failure with mild edema left greater than right. Electronically Signed   By: Franchot Gallo M.D.   On: 04/05/2017 14:45    Pending Labs Unresulted Labs (From admission, onward)   Start     Ordered   04/06/17 0500  CBC  Tomorrow morning,   R     04/05/17 1600   04/06/17 7619  Basic metabolic panel  Tomorrow morning,   R     04/05/17 1600   04/06/17 0500  Blood gas, arterial  Tomorrow morning,   R    Question:  Room air or oxygen  Answer:  Oxygen   04/05/17 1600   04/06/17 0500  Magnesium  Tomorrow morning,   R     04/05/17 1600   04/06/17 0500  Phosphorus  Tomorrow morning,   R     04/05/17 1600   04/06/17 0500  CK  Tomorrow morning,   R     04/05/17 1608   04/05/17 1700  Lactic acid, plasma  STAT Now then every 3 hours,   R     04/05/17 1611   04/05/17 1618  Ethanol  Once,   R     04/05/17  1617   07/27/92 8546  Salicylate level  Once,   R     04/05/17 1617   04/05/17 1609  CK  Once,   R     04/05/17 1608   04/05/17 1601  Respiratory Panel by PCR  (Respiratory virus panel)  Once,   R     04/05/17 1600   04/05/17 1559  Urinalysis, Routine w reflex microscopic  Once,   R     04/05/17 1600   04/05/17 1558  Brain natriuretic peptide  Once,   R     04/05/17 1600   04/05/17 1558  Culture, blood (routine x 2)  BLOOD CULTURE X 2,   R     04/05/17 1600   04/05/17 1558  Culture, respiratory (tracheal aspirate)  Once,   R     04/05/17 1600   04/05/17 1557  HIV antibody (Routine Testing)  Once,   R     04/05/17 1600   04/05/17 1427  Urinalysis, Routine w reflex microscopic  Once,   R     04/05/17 1426   04/05/17 1421  Triglycerides  (propofol (DIPRIVAN))  Every 72 hours,   R    Comments:  While on propofol (DIPRIVAN)    04/05/17 1420   04/05/17 1411  Blood gas, venous  STAT,   STAT     04/05/17 1410   04/05/17 1359  Blood gas, arterial  STAT,   R     04/05/17 1359      Vitals/Pain Today's Vitals   04/05/17 1500 04/05/17 1530 04/05/17 1600 04/05/17 1615  BP: (!) 149/113 (!) 157/118 (!) 151/113 (!) 145/112  Pulse: 93 84 88 89  Resp: 18 18 (!) 9 (!) 7  Temp: (!) 88.9 F (31.6 C) (!) 88.5 F (31.4 C) (!) 88.5 F (31.4 C) (!) 88.5 F (31.4 C)  TempSrc:      SpO2: 100% 100% 100% 100%  Weight:        Isolation Precautions Droplet precaution  Medications Medications  fentaNYL (SUBLIMAZE) injection 50 mcg (not administered)  fentaNYL 255mcg in NS 246mL (24mcg/ml) infusion-PREMIX (not administered)  fentaNYL (SUBLIMAZE) bolus via infusion 50 mcg (not administered)  propofol (DIPRIVAN) 1000 MG/100ML infusion (0 mcg/kg/min  97.5 kg Intravenous Paused 04/05/17 1547)  hydrALAZINE (APRESOLINE) injection 10-40 mg (not administered)  0.9 %  sodium chloride infusion (not administered)  famotidine (PEPCID) 40 MG/5ML suspension 20 mg (not administered)  0.9 %  sodium chloride  infusion (not administered)  acetaminophen (TYLENOL) tablet 650 mg (not administered)  ondansetron (ZOFRAN) injection 4 mg (not administered)  Ampicillin-Sulbactam (UNASYN) 3 g in sodium chloride 0.9 % 100 mL IVPB (not administered)  sodium chloride 0.9 % bolus 1,000 mL (0 mLs Intravenous Stopped 04/05/17 1546)  sodium chloride 0.9 % bolus 1,000 mL (0 mLs Intravenous Stopped 04/05/17  1546)  0.9 %  sodium chloride infusion ( Intravenous New Bag/Given 04/05/17 1549)  etomidate (AMIDATE) injection (30 mg Intravenous Given 04/05/17 1410)  rocuronium (ZEMURON) injection (100 mg Intravenous Given 04/05/17 1413)    Mobility walks

## 2017-04-05 NOTE — ED Triage Notes (Signed)
Transported by [facility] from home--overdose, unknown substance, hx of overdose/ suicidal attempts. Recently overdose on opioid medication. Incontinent of urine upon arrival. +N/V. 2 mg of Narcan administered IN PTA.

## 2017-04-05 NOTE — Progress Notes (Signed)
Pharmacy Antibiotic Note  Jenna Hill is a 47 y.o. female admitted on 04/05/2017 with aspiration PNA.  Pharmacy has been consulted for Unasyn dosing.  Hypothermic, Cr 2, lactate 10.54. Wt 97.5, CrCl 54 ml/min  Plan: Unasyn 3 gm IV q6h F/u renal function, WBC, temp, culture data  Weight: 215 lb (97.5 kg)  Temp (24hrs), Avg:88.7 F (31.5 C), Min:88.5 F (31.4 C), Max:88.9 F (31.6 C)  Recent Labs  Lab 04/05/17 1400 04/05/17 1401  CREATININE 2.00*  --   LATICACIDVEN  --  10.54*    CrCl cannot be calculated (Unknown ideal weight.).    Allergies  Allergen Reactions  . Bee Venom Anaphylaxis  . Bretylium     Other reaction(s): Seizures  . Codeine Anaphylaxis  . Other Anaphylaxis    bertrillium or bertillium    . Toradol [Ketorolac Tromethamine] Swelling  . Ibuprofen     Raised hives   . Latex Rash    Thank you for allowing pharmacy to be a part of this patient's care.  Eudelia Bunch, Pharm.D. [phone] 04/05/2017 4:18 PM

## 2017-04-05 NOTE — ED Notes (Signed)
Respiratory paged to escort pt for CT scan.

## 2017-04-05 NOTE — H&P (Addendum)
PULMONARY / CRITICAL CARE MEDICINE   Name: Jenna Hill MRN: [id number] DOB: [birth year]    ADMISSION DATE:  04/05/2017 CONSULTATION DATE:  04/05/17  REFERRING MD:  Dr. Leonette Monarch / EDP   CHIEF COMPLAINT:  AMS  HISTORY OF PRESENT ILLNESS:   47 y/o F who presented to [facility] ER via EMS after being found unresponsive.  The patient is altered on mechanical ventilation and is unable to provide history.  There is no family available in ER.    Staff report she was given narcan prior to intubation with little response.  There was reported concern for prior suicide attempts with recent opioid overdose.  No purposeful movement per staff.  She was incontinent of urine.  Venous blood gas assessed > 7.31 / 31.  Rectal temperature was 88 on arrival.  Bair hugger applied.  The patient was intubated for airway protection.  EDP reports she had emesis on face and in back of throat upon intubation.  CXR showed pulmonary edema L>R.  UDS was negative.  The patient was found to be hypertensive with initial MAP of 126 (154/113).    PCCM consulted for ICU admission.   Per mother via phone, the patient went to her psychiatrist on 3/4 and had her medications refilled.   PAST MEDICAL HISTORY :  She  has a past medical history of Bipolar 1 disorder (Jenna Hill), Hypertension, and Schizophrenia (Jenna Hill).  PAST SURGICAL HISTORY: She  has a past surgical history that includes Ankle arthroscopy (Right); Cesarean section; Hand surgery (Right); and ORIF ankle fracture (Left, 01/29/2016).  Allergies  Allergen Reactions  . Bee Venom Anaphylaxis  . Bretylium     Other reaction(s): Seizures  . Codeine Anaphylaxis  . Other Anaphylaxis    bertrillium or bertillium    . Toradol [Ketorolac Tromethamine] Swelling  . Ibuprofen     Raised hives   . Latex Rash    No current facility-administered medications on file prior to encounter.    Current Outpatient Medications on File Prior to Encounter  Medication Sig  . ABILIFY MAINTENA 400  MG PRSY Inject 400 mg into the skin every 30 (thirty) days. 12/18/15  . acetaminophen (TYLENOL) 500 MG tablet Take 2 tablets (1,000 mg total) by mouth every 6 (six) hours as needed for mild pain or moderate pain.  Marland Kitchen amLODipine (NORVASC) 5 MG tablet Take 1 tablet (5 mg total) by mouth daily.  Marland Kitchen aspirin EC 81 MG tablet Take 1 tablet (81 mg total) by mouth 2 (two) times daily.  Marland Kitchen EPINEPHrine 0.3 mg/0.3 mL IJ SOAJ injection Inject 0.3 mLs (0.3 mg total) into the muscle daily as needed (allergic reaction).  . ESZOPICLONE 3 MG tablet Take 3 mg by mouth at bedtime.  . hydrochlorothiazide (HYDRODIURIL) 12.5 MG tablet Take 2 tablets (25 mg total) by mouth daily.  Marland Kitchen loratadine (ALLERGY RELIEF) 10 MG tablet Take 10 mg by mouth daily.  . QUEtiapine (SEROQUEL XR) 400 MG 24 hr tablet Take 400 mg by mouth 2 (two) times daily.  . TRINTELLIX 10 MG TABS Take 10 mg by mouth 2 (two) times daily.     FAMILY HISTORY:  Her has no family status information on file.    SOCIAL HISTORY: She  reports that  has never smoked. she has never used smokeless tobacco. She reports that she drinks alcohol. She reports that she does not use drugs.  REVIEW OF SYSTEMS:  Unable to complete as patient is altered on mechanical ventilation, no family available.   SUBJECTIVE:  VITAL SIGNS: BP (!) 149/113   Pulse 93   Temp (!) 88.9 F (31.6 C)   Resp 18   Wt 215 lb (97.5 kg)   SpO2 100%   BMI 36.90 kg/m   HEMODYNAMICS:    VENTILATOR SETTINGS: Vent Mode: PRVC FiO2 (%):  [100 %] 100 % Set Rate:  [18 bmp] 18 bmp Vt Set:  [500 mL] 500 mL PEEP:  [5 cmH20] 5 cmH20 Plateau Pressure:  [18 cmH20] 18 cmH20  INTAKE / OUTPUT: No intake/output data recorded.  PHYSICAL EXAMINATION: General: obese female on vent lying on ER stretcher  HEENT: MM pink/moist, ETT Neuro: obtunded, no response to painful stimuli, no cough, pupils equal 2-45mm  CV: s1s2 rrr, no m/r/g PULM: even/non-labored, lungs bilaterally coarse  GI: soft,  non-tender, bsx4 active  Extremities: warm/dry, no edema  Skin: no rashes or lesions  LABS:  BMET No results for input(s): NA, K, CL, CO2, BUN, CREATININE, GLUCOSE in the last 168 hours.  Electrolytes No results for input(s): CALCIUM, MG, PHOS in the last 168 hours.  CBC No results for input(s): WBC, HGB, HCT, PLT in the last 168 hours.  Coag's No results for input(s): APTT, INR in the last 168 hours.  Sepsis Markers No results for input(s): LATICACIDVEN, PROCALCITON, O2SATVEN in the last 168 hours.  ABG No results for input(s): PHART, PCO2ART, PO2ART in the last 168 hours.  Liver Enzymes No results for input(s): AST, ALT, ALKPHOS, BILITOT, ALBUMIN in the last 168 hours.  Cardiac Enzymes No results for input(s): TROPONINI, PROBNP in the last 168 hours.  Glucose No results for input(s): GLUCAP in the last 168 hours.  Imaging Dg Chest Port 1 View  Result Date: 04/05/2017 CLINICAL DATA:  NG placement EXAM: PORTABLE CHEST 1 VIEW COMPARISON:  10/07/2012 FINDINGS: Endotracheal tube in good position.  NG tube in the stomach. Cardiac enlargement with vascular congestion. Mild perihilar airspace disease left greater than right likely due to mild edema. No effusion IMPRESSION: Endotracheal tube in good position.  NG tube in the stomach Congestive heart failure with mild edema left greater than right. Electronically Signed   By: Franchot Gallo M.D.   On: 04/05/2017 14:45     STUDIES:  CT Head 3/5 >>   CULTURES: BCx2 3/5 >>  Sputum 3/5 >>  RVP 3/5 >>   ANTIBIOTICS: Unasyn 3/5 >>   SIGNIFICANT EVENTS: 3/05  Admit with AMS, possible aspiration, ? overdose   LINES/TUBES: ETT 3/5 >>   DISCUSSION: 47 y/o F admitted 3/5 with AMS of unclear etiology.  UDS negative.  Hypothermic to 88 rectal on admit and hypertensive (MAP 126).  DDx includes overdose, infectious, hypertensive emergency  ASSESSMENT / PLAN:  PULMONARY A: Acute Respiratory Insufficiency - in setting of altered  mental status  Pulmonary Edema - mild, noted on initial film ? Aspiration - prior to admit, emesis noted at cords on intubation  P:   PRVC 8cc/kg Wean PEEP / FiO2 for sats > 90% Follow ABG, intermittent CXR  Empiric unasyn for possible aspiration, see ID  CARDIOVASCULAR A:  HTN  Cardiomegaly - noted on initial CXR  Hypothermia - initial temp 88 P:  Resume home norvasc per tube  Assess EKG Q4 x 6, QTc 493 on admit (note on abilify, seroquel at home) Assess BNP Bair hugger for re-warming  PRN hydralazine for SBP > 160  RENAL A:   AKI - in setting of suspected prolonged down time, must also consider toxins, HTN Lactic Acidosis  Hypokalemia  Rule Out Rhabdo  P:   Trend BMP / urinary output Replace electrolytes as indicated Avoid nephrotoxic agents, ensure adequate renal perfusion Assess CK now and in am  Trend lactate  NS at 100 ml/hr given AKI, lactate  GASTROINTESTINAL A:   Morbid Obesity  P:   NPO  Place OGT to LIS  Consider TF in am if remains intubated  Pepcid BID PT  PRN zofran for nausea   HEMATOLOGIC A:   DVT Prophylaxis  P:  Assess CT Head, if negative can begin SQ Heparin  SCD's in the interim  Trend CBC   INFECTIOUS A:   Suspected Aspiration  P:   Pan cultures as above Empiric unasyn   ENDOCRINE A:   Hyperglycemia    P:   SSI  Assess A1c   NEUROLOGIC A:   Acute Metabolic Encephalopathy - in setting of overdose Suspected Suicide Attempt  Bipolar Disorder  Schizophrenia  P:   RASS goal: 0  Hold sedation until patient more alert to allow for neuro exam Propofol if needed for sedation  PRN fentanyl for pain  UDS as above  Assess ETOH, salicylate, tylenol, acetaminophen level Assess CT head   Poison control notified   FAMILY  - Updates: No family at bedside.  Will attempt to call numbers listed in chart.   - Inter-disciplinary family meet or Palliative Care meeting due by:  3/10  CC Time: 13 minutes   [name],  NP-C [facility] Pgr: [phone] or if no answer [phone] 04/05/2017, 3:13 PM

## 2017-04-05 NOTE — ED Notes (Signed)
Bed: RESA Expected date:  Expected time:  Means of arrival:  Comments: EMS/O.D. 

## 2017-04-05 NOTE — ED Provider Notes (Signed)
[facility] DEPT Provider Note  CSN: [id number] Arrival date & time: 04/05/17 1334  Chief Complaint(s) Drug Overdose  Triage Note 30 Transported by [facility] from home--overdose, unknown substance, hx of overdose/ suicidal attempts. Recently overdose on opioid medication. Incontinent of urine upon arrival. +N/V. 2 mg of Narcan administered IN PTA.    HPI Jenna Hill is a 47 y.o. female here for altered mental status and lethargy in the setting of likely overdose.   Remainder of history, ROS, and physical exam limited due to patient's condition (AMS). Additional information was obtained from EMS.   Level V Caveat.    HPI  Past Medical History Past Medical History:  Diagnosis Date  . Bipolar 1 disorder ([place])   . Hypertension   . Schizophrenia [facility])    Patient Active Problem List   Diagnosis Date Noted  . 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 OSA (obstructive sleep apnea) 04/16/2015  . Obesity 04/16/2015  . Schizoaffective disorder, depressive type ([place]) 04/15/2015  . Hypertension 10/05/2012   Home Medication(s) Prior to Admission medications   Medication Sig Start Date End Date Taking? Authorizing Provider  ABILIFY MAINTENA 400 MG PRSY Inject 400 mg into the skin every 30 (thirty) days. 12/18/15 12/16/15   [provider]  acetaminophen (TYLENOL) 500 MG tablet Take 2 tablets (1,000 mg total) by mouth every 6 (six) hours as needed for mild pain or moderate pain. 03/05/16   Alfonse Spruce, FNP  amLODipine (NORVASC) 5 MG tablet Take 1 tablet (5 mg total) by mouth daily. 03/05/16   Alfonse Spruce, FNP  aspirin EC 81 MG tablet Take 1 tablet (81 mg total) by mouth 2 (two) times daily. 01/29/16   Wylene Simmer, MD  EPINEPHrine 0.3 mg/0.3 mL IJ SOAJ injection Inject 0.3 mLs (0.3 mg total) into the muscle daily as needed (allergic reaction). 04/23/15   Hildred Priest, MD  ESZOPICLONE 3 MG tablet Take 3 mg by mouth at  bedtime. 12/16/15   [provider]  hydrochlorothiazide (HYDRODIURIL) 12.5 MG tablet Take 2 tablets (25 mg total) by mouth daily. 03/05/16   Alfonse Spruce, FNP  loratadine (ALLERGY RELIEF) 10 MG tablet Take 10 mg by mouth daily.    [provider]  QUEtiapine (SEROQUEL XR) 400 MG 24 hr tablet Take 400 mg by mouth 2 (two) times daily.    [provider]  TRINTELLIX 10 MG TABS Take 10 mg by mouth 2 (two) times daily.  12/19/15   [provider]                                                                                                                                    Past Surgical History Past Surgical History:  Procedure Laterality Date  . ANKLE ARTHROSCOPY Right   . CESAREAN SECTION    . HAND SURGERY Right   . ORIF ANKLE FRACTURE Left 01/29/2016   Procedure: OPEN REDUCTION  INTERNAL FIXATION LEFT ANKLE BIMALLEOLAR FRACTURE AND SYNDESMOSIS;  Surgeon: Wylene Simmer, MD;  Location: [facility];  Service: Orthopedics;  Laterality: Left;   Family History No family history on file.  Social History Social History   Tobacco Use  . Smoking status: Never Smoker  . Smokeless tobacco: Never Used  Substance Use Topics  . Alcohol use: Yes    Comment: occ  . Drug use: No   Allergies Bee venom; Bretylium; Codeine; Other; Toradol [ketorolac tromethamine]; Ibuprofen; and Latex  Review of Systems Review of Systems  Unable to perform ROS: Mental status change    Physical Exam Vital Signs  I have reviewed the triage vital signs BP (!) 142/99   Pulse 100   Temp (!) 88.8 F (31.6 C) (Rectal)   Resp 20   SpO2 99%   Physical Exam  Constitutional: She appears well-developed and well-nourished. She appears lethargic. No distress.  HENT:  Head: Normocephalic and atraumatic.  Nose: Nose normal.  Dried vomitus on her cheeks  Eyes: Conjunctivae and EOM are normal. Pupils are equal, round, and reactive to light. Right eye exhibits no  discharge. Left eye exhibits no discharge. No scleral icterus.  Neck: Normal range of motion. Neck supple.  Cardiovascular: Normal rate and regular rhythm. Exam reveals no gallop and no friction rub.  No murmur heard. Pulmonary/Chest: Effort normal and breath sounds normal. No stridor. Tachypnea noted. No respiratory distress. She has no rales.  Abdominal: Soft. She exhibits no distension. There is no tenderness.  Musculoskeletal: She exhibits no edema or tenderness.  Neurological: She appears lethargic. GCS eye subscore is 2. GCS verbal subscore is 3. GCS motor subscore is 4.  Skin: Skin is warm and dry. No rash noted. She is not diaphoretic. No erythema.  Psychiatric: She has a normal mood and affect.  Vitals reviewed.   ED Results and Treatments Labs (all labs ordered are listed, but only abnormal results are displayed) Labs Reviewed  BLOOD GAS, ARTERIAL  BLOOD GAS, VENOUS  TRIGLYCERIDES  URINALYSIS, ROUTINE W REFLEX MICROSCOPIC  I-STAT CG4 LACTIC ACID, ED  I-STAT BETA HCG BLOOD, ED ([facility], [facility], [facility] ONLY)  I-STAT CHEM 8, ED                                                                                                                         EKG  EKG Interpretation  Date/Time:    Ventricular Rate:    PR Interval:    QRS Duration:   QT Interval:    QTC Calculation:   R Axis:     Text Interpretation:        Radiology No results found. Pertinent labs & imaging results that were available during my care of the patient were reviewed by me and considered in my medical decision making (see chart for details).  Medications Ordered in ED Medications  sodium chloride 0.9 % bolus 1,000 mL (not administered)  0.9 %  sodium chloride infusion (not administered)  fentaNYL (  SUBLIMAZE) injection 50 mcg (not administered)  fentaNYL 2538mcg in NS 252mL (37mcg/ml) infusion-PREMIX (not administered)  fentaNYL (SUBLIMAZE) bolus via infusion 50 mcg (not administered)  propofol (DIPRIVAN)  1000 MG/100ML infusion (5 mcg/kg/min Intravenous New Bag/Given 04/05/17 1415)  sodium chloride 0.9 % bolus 1,000 mL (1,000 mLs Intravenous New Bag/Given 04/05/17 1423)  etomidate (AMIDATE) injection (30 mg Intravenous Given 04/05/17 1410)  rocuronium (ZEMURON) injection (100 mg Intravenous Given 04/05/17 1413)                                                                                                                                    Procedures Procedure Name: Intubation Date/Time: 04/05/2017 2:32 PM Performed by: Fatima Blank, MD Pre-anesthesia Checklist: Patient identified, Patient being monitored, Emergency Drugs available, Timeout performed and Suction available Oxygen Delivery Method: Non-rebreather mask Preoxygenation: Pre-oxygenation with 100% oxygen Induction Type: Rapid sequence Ventilation: Mask ventilation without difficulty Laryngoscope Size: Glidescope Grade View: Grade I Tube size: 7.5 mm Number of attempts: 1 Airway Equipment and Method: Video-laryngoscopy Placement Confirmation: ETT inserted through vocal cords under direct vision,  CO2 detector and Breath sounds checked- equal and bilateral Tube secured with: ETT holder Difficulty Due To: Difficulty was anticipated      CRITICAL CARE Performed by: Grayce Sessions Cardama Total critical care time: 40 minutes Critical care time was exclusive of separately billable procedures and treating other patients. Critical care was necessary to treat or prevent imminent or life-threatening deterioration. Critical care was time spent personally by me on the following activities: development of treatment plan with patient and/or surrogate as well as nursing, discussions with consultants, evaluation of patient's response to treatment, examination of patient, obtaining history from patient or surrogate, ordering and performing treatments and interventions, ordering and review of laboratory studies, ordering and review of  radiographic studies, pulse oximetry and re-evaluation of patient's condition.   (including critical care time)  Medical Decision Making / ED Course I have reviewed the nursing notes for this encounter and the patient's prior records (if available in EHR or on provided paperwork).  Clinical Course as of Apr 06 1618  Tue Apr 05, 2017  1415 Altered mental status.  Patient is hypothermic, tachycardic and normotensive.  Patient is obtunded with dried vomitus on her cheeks.  Likely overdose from narcotics or benzos.  2 intranasal doses of Narcan did not improve so I am more suspicious for benzodiazepine overdose.  Patient will require intubation for airway protection.  Coingestion labs obtained.  We will also obtain infectious workup to rule out other causes.  [PC]  1504 Elevated lactic acid 10.  Labs revealed hypokalemia, renal insufficiency. Evidence of hemoconcentration.   [PC]  2979 Discussed case with critical care who will evaluate the patient for admission.  [PC]  8921 Currently still pending CT head.  [PC]    Clinical Course User Index [PC] Cardama, Grayce Sessions, MD       Final Clinical Impression(s) /  ED Diagnoses Final diagnoses:  Drug overdose, undetermined intent, initial encounter      This chart was dictated using voice recognition software.  Despite best efforts to proofread,  errors can occur which can change the documentation meaning.   Fatima Blank, MD 04/05/17 [phone]

## 2017-04-05 NOTE — Progress Notes (Signed)
This RT reported VBG results to MD Cardama at approximately1427- due to planned [employer] downtime.  Temp corrected (88.0 F)  Ph 7.316 pc02 30.9 p02 24.6 hco3 16.9

## 2017-04-05 NOTE — Progress Notes (Signed)
CRITICAL VALUE ALERT  Critical Value: Lactic acid 4.2  Date & Time Notied:  04/05/2017 @ 2145  Provider Notified: elink  Orders Received/Actions taken: pending

## 2017-04-05 NOTE — Progress Notes (Signed)
CRITICAL VALUE ALERT  Critical Value:  Lactic Acid 7.0  Date & Time Notied: 04/05/2017 1835  Provider Notified: Mannam  Orders Received/Actions taken: 1 Liter NS Bolus ordered. Watch urine output. If urine output does not increase after bolus, notify E-Link.

## 2017-04-05 NOTE — Progress Notes (Signed)
This RT reported ABG results to NP CCM Olis.  Vent settings: PRVC  VT 500 RR 18 Fi02 100% Peep 5  ABG results (temp corrected (88.0) Ph 7.40 pc02 22.2 p02 460 hco3 15.0

## 2017-04-05 NOTE — ED Notes (Signed)
Pt taken to CT escorted by [facility] and [facility], RT.  Pt will transfer to ICU floor after CT scan.

## 2017-04-06 ENCOUNTER — Inpatient Hospital Stay (HOSPITAL_COMMUNITY): Payer: Medicaid Other

## 2017-04-06 ENCOUNTER — Other Ambulatory Visit (HOSPITAL_COMMUNITY): Payer: Self-pay

## 2017-04-06 DIAGNOSIS — I517 Cardiomegaly: Secondary | ICD-10-CM

## 2017-04-06 DIAGNOSIS — G9340 Encephalopathy, unspecified: Secondary | ICD-10-CM

## 2017-04-06 LAB — BLOOD GAS, ARTERIAL
Acid-base deficit: 6.4 mmol/L — ABNORMAL HIGH (ref 0.0–2.0)
Bicarbonate: 16.4 mmol/L — ABNORMAL LOW (ref 20.0–28.0)
DRAWN BY: 232811
FIO2: 30
MECHVT: 480 mL
O2 Saturation: 97.5 %
PEEP: 5 cmH2O
PO2 ART: 121 mmHg — AB (ref 83.0–108.0)
Patient temperature: 37
RATE: 16 resp/min
pCO2 arterial: 27 mmHg — ABNORMAL LOW (ref 32.0–48.0)
pH, Arterial: 7.4 (ref 7.350–7.450)

## 2017-04-06 LAB — CK

## 2017-04-06 LAB — GLUCOSE, CAPILLARY
GLUCOSE-CAPILLARY: 122 mg/dL — AB (ref 65–99)
GLUCOSE-CAPILLARY: 123 mg/dL — AB (ref 65–99)
Glucose-Capillary: 116 mg/dL — ABNORMAL HIGH (ref 65–99)
Glucose-Capillary: 124 mg/dL — ABNORMAL HIGH (ref 65–99)
Glucose-Capillary: 130 mg/dL — ABNORMAL HIGH (ref 65–99)
Glucose-Capillary: 138 mg/dL — ABNORMAL HIGH (ref 65–99)
Glucose-Capillary: 90 mg/dL (ref 65–99)

## 2017-04-06 LAB — CBC
HEMATOCRIT: 42.4 % (ref 36.0–46.0)
Hemoglobin: 14.5 g/dL (ref 12.0–15.0)
MCH: 29.8 pg (ref 26.0–34.0)
MCHC: 34.2 g/dL (ref 30.0–36.0)
MCV: 87.1 fL (ref 78.0–100.0)
PLATELETS: 286 10*3/uL (ref 150–400)
RBC: 4.87 MIL/uL (ref 3.87–5.11)
RDW: 13.7 % (ref 11.5–15.5)
WBC: 15.9 10*3/uL — ABNORMAL HIGH (ref 4.0–10.5)

## 2017-04-06 LAB — TSH: TSH: 1.108 u[IU]/mL (ref 0.350–4.500)

## 2017-04-06 LAB — RESPIRATORY PANEL BY PCR
Adenovirus: NOT DETECTED
Bordetella pertussis: NOT DETECTED
CORONAVIRUS HKU1-RVPPCR: NOT DETECTED
CORONAVIRUS OC43-RVPPCR: NOT DETECTED
Chlamydophila pneumoniae: NOT DETECTED
Coronavirus 229E: NOT DETECTED
Coronavirus NL63: NOT DETECTED
INFLUENZA B-RVPPCR: NOT DETECTED
Influenza A: NOT DETECTED
Metapneumovirus: NOT DETECTED
Mycoplasma pneumoniae: NOT DETECTED
PARAINFLUENZA VIRUS 1-RVPPCR: NOT DETECTED
Parainfluenza Virus 2: NOT DETECTED
Parainfluenza Virus 3: NOT DETECTED
Parainfluenza Virus 4: NOT DETECTED
RESPIRATORY SYNCYTIAL VIRUS-RVPPCR: NOT DETECTED
Rhinovirus / Enterovirus: NOT DETECTED

## 2017-04-06 LAB — BASIC METABOLIC PANEL
Anion gap: 11 (ref 5–15)
BUN: 26 mg/dL — ABNORMAL HIGH (ref 6–20)
CALCIUM: 7.5 mg/dL — AB (ref 8.9–10.3)
CO2: 22 mmol/L (ref 22–32)
CREATININE: 2.94 mg/dL — AB (ref 0.44–1.00)
Chloride: 110 mmol/L (ref 101–111)
GFR, EST AFRICAN AMERICAN: 21 mL/min — AB (ref >60)
GFR, EST NON AFRICAN AMERICAN: 18 mL/min — AB (ref >60)
GLUCOSE: 123 mg/dL — AB (ref 65–99)
Potassium: 4 mmol/L (ref 3.5–5.1)
Sodium: 143 mmol/L (ref 135–145)

## 2017-04-06 LAB — HEPATIC FUNCTION PANEL
ALT: 331 U/L — AB (ref 14–54)
AST: 805 U/L — ABNORMAL HIGH (ref 15–41)
Albumin: 2.6 g/dL — ABNORMAL LOW (ref 3.5–5.0)
Alkaline Phosphatase: 34 U/L — ABNORMAL LOW (ref 38–126)
Bilirubin, Direct: 0.1 mg/dL (ref 0.1–0.5)
Indirect Bilirubin: 0.5 mg/dL (ref 0.3–0.9)
TOTAL PROTEIN: 5.5 g/dL — AB (ref 6.5–8.1)
Total Bilirubin: 0.6 mg/dL (ref 0.3–1.2)

## 2017-04-06 LAB — ECHOCARDIOGRAM COMPLETE
HEIGHTINCHES: 66.5 in
Weight: 4035.3 oz

## 2017-04-06 LAB — PHOSPHORUS: PHOSPHORUS: 4.6 mg/dL (ref 2.5–4.6)

## 2017-04-06 LAB — MAGNESIUM: MAGNESIUM: 1.6 mg/dL — AB (ref 1.7–2.4)

## 2017-04-06 LAB — HIV ANTIBODY (ROUTINE TESTING W REFLEX): HIV SCREEN 4TH GENERATION: NONREACTIVE

## 2017-04-06 MED ORDER — SODIUM CHLORIDE 0.9 % IV SOLN
1.0000 g | Freq: Once | INTRAVENOUS | Status: AC
  Administered 2017-04-06: 1 g via INTRAVENOUS
  Filled 2017-04-06: qty 10

## 2017-04-06 MED ORDER — VITAL HIGH PROTEIN PO LIQD
1000.0000 mL | ORAL | Status: DC
  Administered 2017-04-06: 1000 mL
  Filled 2017-04-06: qty 1000

## 2017-04-06 MED ORDER — PRO-STAT SUGAR FREE PO LIQD
60.0000 mL | Freq: Three times a day (TID) | ORAL | Status: DC
  Administered 2017-04-06 – 2017-04-07 (×5): 60 mL
  Filled 2017-04-06 (×4): qty 60

## 2017-04-06 MED ORDER — STERILE WATER FOR INJECTION IV SOLN
INTRAVENOUS | Status: DC
  Administered 2017-04-06 – 2017-04-07 (×3): via INTRAVENOUS
  Filled 2017-04-06 (×4): qty 850

## 2017-04-06 MED ORDER — AMLODIPINE BESYLATE 5 MG PO TABS
5.0000 mg | ORAL_TABLET | Freq: Every day | ORAL | Status: DC
Start: 2017-04-06 — End: 2017-04-06

## 2017-04-06 MED ORDER — MAGNESIUM SULFATE 2 GM/50ML IV SOLN
2.0000 g | Freq: Once | INTRAVENOUS | Status: AC
  Administered 2017-04-06: 2 g via INTRAVENOUS
  Filled 2017-04-06: qty 50

## 2017-04-06 MED ORDER — SODIUM CHLORIDE 0.9 % IV SOLN
3.0000 g | Freq: Two times a day (BID) | INTRAVENOUS | Status: DC
  Administered 2017-04-06 – 2017-04-07 (×2): 3 g via INTRAVENOUS
  Filled 2017-04-06 (×2): qty 3

## 2017-04-06 MED ORDER — VITAL HIGH PROTEIN PO LIQD
1000.0000 mL | ORAL | Status: DC
  Administered 2017-04-07: 1000 mL
  Filled 2017-04-06: qty 1000

## 2017-04-06 MED ORDER — SODIUM CHLORIDE 0.9 % IV BOLUS (SEPSIS)
2000.0000 mL | Freq: Once | INTRAVENOUS | Status: AC
  Administered 2017-04-06: 2000 mL via INTRAVENOUS

## 2017-04-06 MED ORDER — HEPARIN SODIUM (PORCINE) 5000 UNIT/ML IJ SOLN
5000.0000 [IU] | Freq: Three times a day (TID) | INTRAMUSCULAR | Status: DC
  Administered 2017-04-06 – 2017-04-29 (×63): 5000 [IU] via SUBCUTANEOUS
  Filled 2017-04-06 (×66): qty 1

## 2017-04-06 MED ORDER — PRO-STAT SUGAR FREE PO LIQD
30.0000 mL | Freq: Two times a day (BID) | ORAL | Status: DC
  Administered 2017-04-06: 30 mL
  Filled 2017-04-06: qty 30

## 2017-04-06 NOTE — Progress Notes (Signed)
Pharmacy Antibiotic Note  Jenna Hill is a 47 y.o. female with hx bipolar and overdose/suicidal attempts presented to the ED on 04/05/2017 with AMS and was subsequently intubated.  Unasyn started on admission for suspected aspiration PNA.  Today, 04/06/2017: - Day #1 abx - Hypothermic, wbc up 15.9 - scr up 2.94 (crcl~27N), on bicarb drip -  LA down 4.2 - CK >50K  Plan: - adjust unasyn dose to 3 gm IV q12h for renal insuff. - monitor renal function closely - f/u cultures ________________________________  Height: 5' 6.5" (168.9 cm) Weight: 252 lb 3.3 oz (114.4 kg) IBW/kg (Calculated) : 60.45  Temp (24hrs), Avg:93.2 F (34 C), Min:88.5 F (31.4 C), Max:99.5 F (37.5 C)  Recent Labs  Lab 04/05/17 1355 04/05/17 1400 04/05/17 1401 04/05/17 1751 04/05/17 2122 04/06/17 0306  WBC 11.8*  --   --   --   --  15.9*  CREATININE 2.01* 2.00*  --   --   --  2.94*  LATICACIDVEN  --   --  10.54* 7.0* 4.2*  --     Estimated Creatinine Clearance: 31 mL/min (A) (by C-G formula based on SCr of 2.94 mg/dL (H)).    Allergies  Allergen Reactions  . Bee Venom Anaphylaxis  . Bretylium     Other reaction(s): Seizures  . Codeine Anaphylaxis  . Other Anaphylaxis    bertrillium or bertillium    . Toradol [Ketorolac Tromethamine] Swelling  . Ibuprofen     Raised hives   . Latex Rash    Antimicrobials this admission:  3/5 unasyn>>  Dose adjustments this admission:  --   Microbiology results:  3/5 BCx x2:  3/5 MRSA PCR: neg 3/5 resp panel pcr: neg  Thank you for allowing pharmacy to be a part of this patient's care.  Lynelle Doctor 04/06/2017 9:29 AM

## 2017-04-06 NOTE — Progress Notes (Signed)
  Echocardiogram 2D Echocardiogram has been performed.  Jenna Hill F 04/06/2017, 4:54 PM

## 2017-04-06 NOTE — Progress Notes (Signed)
PULMONARY / CRITICAL CARE MEDICINE   Name: Jenna Hill MRN: [id number] DOB: Apr 27, 1970    ADMISSION DATE:  04/05/2017 CONSULTATION DATE:  04/05/17  REFERRING MD:  Dr. Leonette Monarch / EDP   CHIEF COMPLAINT:  AMS  BRIEF SUMMARY:   47 y/o F who presented to [facility] ER via EMS after being found unresponsive.  Prior hx of overdose / suicide attempt.  UDS negative on presentation.  Hypertensive, hypothermic, altered mental status, AKI with lactic acidosis. Found to have rhabdomyolysis with CK >50k.     SUBJECTIVE:  Pt rewarmed, Tmax 99.5.  Pt on 10 mcg propofol.  I/O +240 ml.    VITAL SIGNS: BP (!) 128/92   Pulse (!) 120   Temp 99.5 F (37.5 C)   Resp 20   Ht 5' 6.5" (1.689 m)   Wt 252 lb 3.3 oz (114.4 kg)   SpO2 100%   BMI 40.10 kg/m   HEMODYNAMICS:    VENTILATOR SETTINGS: Vent Mode: PRVC FiO2 (%):  [30 %-100 %] 30 % Set Rate:  [16 bmp-18 bmp] 16 bmp Vt Set:  [480 mL-500 mL] 480 mL PEEP:  [5 cmH20] 5 cmH20 Plateau Pressure:  [18 cmH20-20 cmH20] 18 cmH20  INTAKE / OUTPUT: I/O last 3 completed shifts: In: 944.6 [I.V.:744.6; IV Piggyback:200] Out: 707 [Urine:7; Emesis/NG output:700]  PHYSICAL EXAMINATION: General: critically ill appearing female on vent HEENT: MM pink/moist, ETT Neuro: opens eyes to voice & looks at provider, no follow commands CV: s1s2 rrr, no m/r/g PULM: even/non-labored, lungs bilaterally coarse WC:BJSE, non-tender, bsx4 active  Extremities: warm/dry, no edema. L ankle scar (well healed) Skin: no rashes or lesions  LABS:  BMET Recent Labs  Lab 04/05/17 1355 04/05/17 1400 04/06/17 0306  NA 138 140 143  K 2.7* 2.7* 4.0  CL 101 101 110  CO2 15*  --  22  BUN 21* 23* 26*  CREATININE 2.01* 2.00* 2.94*  GLUCOSE 322* 317* 123*    Electrolytes Recent Labs  Lab 04/05/17 1355 04/06/17 0306  CALCIUM 8.6* 7.5*  MG 2.2 1.6*  PHOS  --  4.6    CBC Recent Labs  Lab 04/05/17 1355 04/05/17 1400 04/06/17 0306  WBC 11.8*  --  15.9*  HGB 15.6*  16.0* 14.5  HCT 44.9 47.0* 42.4  PLT 268  --  286    Coag's No results for input(s): APTT, INR in the last 168 hours.  Sepsis Markers Recent Labs  Lab 04/05/17 1401 04/05/17 1751 04/05/17 2122  LATICACIDVEN 10.54* 7.0* 4.2*    ABG Recent Labs  Lab 04/05/17 1525 04/06/17 0420  PHART 7.408 7.400  PCO2ART 22.2* 27.0*  PO2ART 460* 121*    Liver Enzymes Recent Labs  Lab 04/05/17 1355  AST 349*  ALT 184*  ALKPHOS 41  BILITOT 0.6  ALBUMIN 3.8    Cardiac Enzymes No results for input(s): TROPONINI, PROBNP in the last 168 hours.  Glucose Recent Labs  Lab 04/05/17 1806 04/05/17 1958 04/05/17 2332 04/06/17 0334  GLUCAP 218* 175* 138* 130*    Imaging Ct Head Wo Contrast  Result Date: 04/05/2017 CLINICAL DATA:  Altered level of consciousness. Overdose of unknown substance. Incontinence of urine upon arrival. EXAM: CT HEAD WITHOUT CONTRAST TECHNIQUE: Contiguous axial images were obtained from the base of the skull through the vertex without intravenous contrast. COMPARISON:  03/18/2012 CT FINDINGS: BRAIN: The ventricles and sulci are normal. No intraparenchymal hemorrhage, mass effect nor midline shift. No acute large vascular territory infarcts. Grey-white matter distinction is maintained. The basal ganglia  are unremarkable. No abnormal extra-axial fluid collections. Basal cisterns are not effaced and midline. The brainstem and cerebellar hemispheres are without acute abnormalities. VASCULAR: Unremarkable. SKULL/SOFT TISSUES: No skull fracture. No significant soft tissue swelling. Parasagittal forehead scar is believed to account for the subtle soft tissue hyperdensity seen previously. ORBITS/SINUSES: The included ocular globes and orbital contents are normal.The mastoid air cells are clear. The included paranasal sinuses are well-aerated. OTHER: None. IMPRESSION: No acute intracranial abnormality. Electronically Signed   By: Ashley Royalty M.D.   On: 04/05/2017 16:54   Dg Chest  Port 1 View  Result Date: 04/05/2017 CLINICAL DATA:  NG placement EXAM: PORTABLE CHEST 1 VIEW COMPARISON:  10/07/2012 FINDINGS: Endotracheal tube in good position.  NG tube in the stomach. Cardiac enlargement with vascular congestion. Mild perihilar airspace disease left greater than right likely due to mild edema. No effusion IMPRESSION: Endotracheal tube in good position.  NG tube in the stomach Congestive heart failure with mild edema left greater than right. Electronically Signed   By: Franchot Gallo M.D.   On: 04/05/2017 14:45     STUDIES:  CT Head 3/5 >> no acute abnormality   CULTURES: BCx2 3/5 >>  Sputum 3/5 >>  RVP 3/5 >> negative   ANTIBIOTICS: Unasyn 3/5 >>   SIGNIFICANT EVENTS: 3/05  Admit with AMS, hypothermia, hypertension, possible aspiration, ? overdose   LINES/TUBES: ETT 3/5 >>   DISCUSSION: 47 y/o F admitted 3/5 with AMS of unclear etiology.  UDS negative.  Hypothermic to 88 rectal on admit and hypertensive (MAP 126).  DDx includes overdose, infectious, hypertensive emergency  ASSESSMENT / PLAN:  PULMONARY A: Acute Respiratory Insufficiency - in setting of altered mental status  Pulmonary Edema - mild, noted on initial film ? Aspiration - prior to admit, emesis noted at cords on intubation  P:   PRVC 8 cc/kg  Wean PEEP / FiO2 for sats > 90% Follow ABG, intermittent CXR See ID   CARDIOVASCULAR A:  HTN  Cardiomegaly - noted on initial CXR  Hypothermia - initial temp 88 P:  Norvasc 5mg  PT QD Follow EKG > QTc 467 Bair hugger as needed for normothermia  PRN hydralazine for SBP > 160  RENAL A:   AKI - in setting of suspected prolonged down time, must also consider toxins, HTN Lactic Acidosis  Hypokalemia  Hypomagnesemia  Rhabdomyolysis - CK >50k on admit P:   2L NS bolus now x1  Add sodium bicarbonate at 127ml/hr Trend CK  Trend BMP / urinary output Replace electrolytes as indicated Avoid nephrotoxic agents, ensure adequate renal  perfusion Trend lactate  GASTROINTESTINAL A:   Morbid Obesity  P:   NPO / OGT Begin TF  Pepcid PT  PRN zofran   HEMATOLOGIC A:   DVT Prophylaxis  P:  SCD's + SQ Heparin for DVT prophylaxis  Trend CBC    INFECTIOUS A:   Suspected Aspiration  P:   Follow cultures as above Empiric unasyn    ENDOCRINE A:   Hyperglycemia - Hgb A1c 6.9 on admit  P:   SSI Assess TSH    NEUROLOGIC A:   Acute Metabolic Encephalopathy - in setting of overdose Suspected Suicide Attempt - ETOH, tylenol, salicylate negative  Bipolar Disorder  Schizophrenia  P:   RASS goal: 0 to -1  Propofol for sedation  PRN fentanyl for pain  Daily WUA / SBT     FAMILY  - Updates:  No family at bedside.    - Inter-disciplinary family meet or  Palliative Care meeting due by:  3/10  CC Time: 76 minutes   [name], NP-C [facility] Pgr: [phone] or if no answer [phone] 04/06/2017, 8:24 AM

## 2017-04-06 NOTE — Progress Notes (Signed)
Initial Nutrition Assessment  DOCUMENTATION CODES:   Morbid obesity  INTERVENTION:  - Will adjust TF regimen: Vital High Protein @ 15 mL/hr with 60 mL Prostat TID. This regimen + kcal from current Propofol rate will provide 1683 kcal (105% estimated kcal need), 121 grams of protein, and 301 mL free water.  - Will monitor Propofol rate.   NUTRITION DIAGNOSIS:   Inadequate oral intake related to inability to eat as evidenced by NPO status.  GOAL:   Provide needs based on ASPEN/SCCM guidelines  MONITOR:   Vent status, TF tolerance, Weight trends, Labs  REASON FOR ASSESSMENT:   Consult Enteral/tube feeding initiation and management  ASSESSMENT:   47 y.o. female admitted for AMS and lethargy in the setting of questionable overdose. Pt was intubated in the ED.  BMI indicates morbid obesity. No family/visitors present to provide PTA information. Pt intubated, sedated, and OGT in place. No imaging report available to determine where the tip of the tube is located.   Per Brandi's, PCCM NP, note this AM: questionable aspiration, lactic acidosis, rhabdomyolysis, metabolic encephalopathy in setting of OD, suspected SA.   Patient is currently intubated on ventilator support MV: 9.7 L/min Temp (24hrs), Avg:94.2 F (34.6 C), Min:88.5 F (31.4 C), Max:99.9 F (37.7 C) Propofol: 27.4 ml/hr (723 kcal) BP: 110/75 and MAP: 81  Medications reviewed; sliding scale Novolog, 2 g IV Mg sulfate x1 run today, 40 mEq KCl per OGT x2 doses yesterday, Labs reviewed; BUN: 26 mg/dL, creatinine: 2.94 mg/dL, Ca: 7.5 mg/dL, Mg: 1.6 mg/dL, GFR:21 mL/min.  IVF: 150 mEq sodium bicarb in sterile water @ 125 mL/hr.  Drip: Propofol @ 40 mcg/kg/min.      NUTRITION - FOCUSED PHYSICAL EXAM:  Completed/assessed with no muscle and no fat wasting; moderate edema noted to extremities and facial region.   Diet Order:  Diet NPO time specified  EDUCATION NEEDS:   No education needs have been identified at  this time  Skin:  Skin Assessment: Reviewed RN Assessment  Last BM:     Height:   Ht Readings from Last 1 Encounters:  04/05/17 5' 6.5" (1.689 m)    Weight:   Wt Readings from Last 1 Encounters:  04/06/17 252 lb 3.3 oz (114.4 kg)    Ideal Body Weight:  60.23 kg  BMI:  Body mass index is 40.1 kg/m.  Estimated Nutritional Needs:   Kcal:  1258-1602 (11-14 kcal/kg)  Protein:  >/= 120 grams (2 grams/kg IBW)  Fluid:  >/= 1.8 L/day      Jenna Matin, MS, RD, LDN, [facility] Inpatient Clinical Dietitian Pager # [phone] After hours/weekend pager # [phone]

## 2017-04-06 NOTE — Progress Notes (Signed)
Holding wean due to tachycardia (140+ bpm). Will attempt at later time.

## 2017-04-06 NOTE — Progress Notes (Signed)
Pt w/ 7 cc urine output overnight. RN attempted to flush foley during AM assessment and met significant resistance. Balloon deflated and foley repositioned. Attempted to flush foley again w/ success. 10 cc urine output since flushing. NP made aware. 2 L bolus given per order. 2.5 cc output after completion of bolus. Foley exchanged. No urine return. Bladder scan revealed urine volume of zero. NP notified. Will continue to monitor patient closely.

## 2017-04-07 ENCOUNTER — Inpatient Hospital Stay (HOSPITAL_COMMUNITY): Payer: Medicaid Other

## 2017-04-07 DIAGNOSIS — J9600 Acute respiratory failure, unspecified whether with hypoxia or hypercapnia: Secondary | ICD-10-CM

## 2017-04-07 DIAGNOSIS — J9601 Acute respiratory failure with hypoxia: Secondary | ICD-10-CM

## 2017-04-07 LAB — COMPREHENSIVE METABOLIC PANEL
ALT: 311 U/L — AB (ref 14–54)
ANION GAP: 14 (ref 5–15)
AST: 733 U/L — ABNORMAL HIGH (ref 15–41)
Albumin: 2.2 g/dL — ABNORMAL LOW (ref 3.5–5.0)
Alkaline Phosphatase: 35 U/L — ABNORMAL LOW (ref 38–126)
BUN: 48 mg/dL — ABNORMAL HIGH (ref 6–20)
CHLORIDE: 106 mmol/L (ref 101–111)
CO2: 22 mmol/L (ref 22–32)
Calcium: 7 mg/dL — ABNORMAL LOW (ref 8.9–10.3)
Creatinine, Ser: 5.22 mg/dL — ABNORMAL HIGH (ref 0.44–1.00)
GFR, EST AFRICAN AMERICAN: 10 mL/min — AB (ref >60)
GFR, EST NON AFRICAN AMERICAN: 9 mL/min — AB (ref >60)
Glucose, Bld: 155 mg/dL — ABNORMAL HIGH (ref 65–99)
POTASSIUM: 3.4 mmol/L — AB (ref 3.5–5.1)
SODIUM: 142 mmol/L (ref 135–145)
Total Bilirubin: 0.7 mg/dL (ref 0.3–1.2)
Total Protein: 5.2 g/dL — ABNORMAL LOW (ref 6.5–8.1)

## 2017-04-07 LAB — CBC
HCT: 38.5 % (ref 36.0–46.0)
Hemoglobin: 12.8 g/dL (ref 12.0–15.0)
MCH: 29.6 pg (ref 26.0–34.0)
MCHC: 33.2 g/dL (ref 30.0–36.0)
MCV: 88.9 fL (ref 78.0–100.0)
PLATELETS: 217 10*3/uL (ref 150–400)
RBC: 4.33 MIL/uL (ref 3.87–5.11)
RDW: 14.2 % (ref 11.5–15.5)
WBC: 17.7 10*3/uL — ABNORMAL HIGH (ref 4.0–10.5)

## 2017-04-07 LAB — CK

## 2017-04-07 LAB — GLUCOSE, CAPILLARY
GLUCOSE-CAPILLARY: 131 mg/dL — AB (ref 65–99)
GLUCOSE-CAPILLARY: 131 mg/dL — AB (ref 65–99)
GLUCOSE-CAPILLARY: 109 mg/dL — AB (ref 65–99)
Glucose-Capillary: 167 mg/dL — ABNORMAL HIGH (ref 65–99)
Glucose-Capillary: 112 mg/dL — ABNORMAL HIGH (ref 65–99)

## 2017-04-07 MED ORDER — SODIUM CHLORIDE 0.9 % IV SOLN
3.0000 g | INTRAVENOUS | Status: DC
  Administered 2017-04-08 – 2017-04-09 (×2): 3 g via INTRAVENOUS
  Filled 2017-04-07 (×2): qty 3

## 2017-04-07 MED ORDER — ORAL CARE MOUTH RINSE
15.0000 mL | OROMUCOSAL | Status: DC
  Administered 2017-04-07 – 2017-04-08 (×5): 15 mL via OROMUCOSAL

## 2017-04-07 MED ORDER — HEPARIN SODIUM (PORCINE) 1000 UNIT/ML IJ SOLN
3000.0000 [IU] | Freq: Once | INTRAMUSCULAR | Status: AC
  Administered 2017-04-07: 3000 [IU] via INTRAVENOUS
  Filled 2017-04-07: qty 3

## 2017-04-07 MED ORDER — AMLODIPINE BESYLATE 5 MG PO TABS
5.0000 mg | ORAL_TABLET | Freq: Every day | ORAL | Status: DC
  Administered 2017-04-07 – 2017-04-23 (×17): 5 mg via ORAL
  Filled 2017-04-07 (×17): qty 1

## 2017-04-07 NOTE — Progress Notes (Signed)
Pharmacy Antibiotic Note  Jenna Hill is a 47 y.o. female with hx bipolar and overdose/suicidal attempts presented to the ED on 04/05/2017 with AMS and was subsequently intubated.  Unasyn started on admission for suspected aspiration PNA.  Today, 04/07/2017: - Day #2 abx - afeb, wbc up 17.7 - scr up 5.22 (crcl~15N) on bicarb drip, not making urine - CK >50K  Plan: - adjust Unasyn dose to 3 gm IV q24h for renal insuff. - monitor renal function closely - f/u cultures ________________________________  Height: 5' 6.5" (168.9 cm) Weight: 268 lb 8.3 oz (121.8 kg) IBW/kg (Calculated) : 60.45  Temp (24hrs), Avg:99.2 F (37.3 C), Min:98.4 F (36.9 C), Max:99.9 F (37.7 C)  Recent Labs  Lab 04/05/17 1355 04/05/17 1400 04/05/17 1401 04/05/17 1751 04/05/17 2122 04/06/17 0306 04/07/17 0247  WBC 11.8*  --   --   --   --  15.9* 17.7*  CREATININE 2.01* 2.00*  --   --   --  2.94* 5.22*  LATICACIDVEN  --   --  10.54* 7.0* 4.2*  --   --     Estimated Creatinine Clearance: 18.1 mL/min (A) (by C-G formula based on SCr of 5.22 mg/dL (H)).    Allergies  Allergen Reactions  . Bee Venom Anaphylaxis  . Bretylium     Other reaction(s): Seizures  . Codeine Anaphylaxis  . Other Anaphylaxis    bertrillium or bertillium    . Toradol [Ketorolac Tromethamine] Swelling  . Ibuprofen     Raised hives   . Latex Rash    Antimicrobials this admission:  3/5 unasyn>>  Dose adjustments this admission:  --   Microbiology results:  3/5 BCx x2:  3/5 MRSA PCR: neg 3/5 resp panel pcr: neg  Thank you for allowing pharmacy to be a part of this patient's care.  Lynelle Doctor 04/07/2017 10:03 AM

## 2017-04-07 NOTE — Progress Notes (Addendum)
PULMONARY / CRITICAL CARE MEDICINE   Name: Jenna Hill MRN: [id number] DOB: Jul 02, 1970    ADMISSION DATE:  04/05/2017 CONSULTATION DATE:  04/05/17  REFERRING MD:  Dr. Leonette Monarch / EDP   CHIEF COMPLAINT:  AMS  BRIEF SUMMARY:   47 y/o F who presented to [facility] ER via EMS after being found unresponsive.  Prior hx of overdose / suicide attempt.  UDS negative on presentation.  Hypertensive, hypothermic, altered mental status, AKI with lactic acidosis. Found to have rhabdomyolysis with CK >50k.     SUBJECTIVE:   No acute events overnight.  Urine output is poor Worsening kidney function  VITAL SIGNS: BP (!) 146/98   Pulse (!) 107   Temp 98.4 F (36.9 C)   Resp 18   Ht 5' 6.5" (1.689 m)   Wt 268 lb 8.3 oz (121.8 kg)   SpO2 97%   BMI 42.69 kg/m    HEMODYNAMICS:    VENTILATOR SETTINGS: Vent Mode: PRVC FiO2 (%):  [30 %] 30 % Set Rate:  [16 bmp] 16 bmp Vt Set:  [480 mL] 480 mL PEEP:  [5 cmH20] 5 cmH20 Pressure Support:  [5 cmH20] 5 cmH20 Plateau Pressure:  [15 cmH20-18 cmH20] 18 cmH20  INTAKE / OUTPUT: I/O last 3 completed shifts: In: 9256.7 [I.V.:6021.7; Other:2020; NG/GT:805; IV Piggyback:410] Out: 759.5 [Urine:59.5; Emesis/NG output:700]  PHYSICAL EXAMINATION: Gen:      No acute distress, obese HEENT:  EOMI, sclera anicteric, ET tube in place Neck:     No masses; no thyromegaly Lungs:    Clear to auscultation bilaterally; normal respiratory effort CV:         Regular rate and rhythm; no murmurs Abd:      + bowel sounds; soft, non-tender; no palpable masses, no distension Ext:    No edema; adequate peripheral perfusion Skin:      Warm and dry; no rash Neuro: alert and orie sedated, arousable  LABS:  BMET Recent Labs  Lab 04/05/17 1355 04/05/17 1400 04/06/17 0306 04/07/17 0247  NA 138 140 143 142  K 2.7* 2.7* 4.0 3.4*  CL 101 101 110 106  CO2 15*  --  22 22  BUN 21* 23* 26* 48*  CREATININE 2.01* 2.00* 2.94* 5.22*  GLUCOSE 322* 317* 123* 155*     Electrolytes Recent Labs  Lab 04/05/17 1355 04/06/17 0306 04/07/17 0247  CALCIUM 8.6* 7.5* 7.0*  MG 2.2 1.6*  --   PHOS  --  4.6  --     CBC Recent Labs  Lab 04/05/17 1355 04/05/17 1400 04/06/17 0306 04/07/17 0247  WBC 11.8*  --  15.9* 17.7*  HGB 15.6* 16.0* 14.5 12.8  HCT 44.9 47.0* 42.4 38.5  PLT 268  --  286 217    Coag's No results for input(s): APTT, INR in the last 168 hours.  Sepsis Markers Recent Labs  Lab 04/05/17 1401 04/05/17 1751 04/05/17 2122  LATICACIDVEN 10.54* 7.0* 4.2*    ABG Recent Labs  Lab 04/05/17 1525 04/06/17 0420  PHART 7.408 7.400  PCO2ART 22.2* 27.0*  PO2ART 460* 121*    Liver Enzymes Recent Labs  Lab 04/05/17 1355 04/06/17 0306 04/07/17 0247  AST 349* 805* 733*  ALT 184* 331* 311*  ALKPHOS 41 34* 35*  BILITOT 0.6 0.6 0.7  ALBUMIN 3.8 2.6* 2.2*    Cardiac Enzymes No results for input(s): TROPONINI, PROBNP in the last 168 hours.  Glucose Recent Labs  Lab 04/06/17 0748 04/06/17 1146 04/06/17 1611 04/06/17 1938 04/06/17 2308 04/07/17 0355  GLUCAP 90 122* 123* 116* 124* 131*    Imaging Dg Chest Port 1 View  Result Date: 04/07/2017 CLINICAL DATA:  47 year old female with altered mental status. Possible overdose. Intubated. EXAM: PORTABLE CHEST 1 VIEW COMPARISON:  04/06/2017 and earlier. FINDINGS: Portable AP semi upright view at 0438 hours. The patient is more rotated to the right. Endotracheal tube tip is stable up the level the clavicles. Enteric tube courses to the abdomen, tip not included. More kyphotic view. Stable cardiac size and mediastinal contours. No pneumothorax. Pulmonary vascularity appears stable and within normal limits. Mild veiling opacity at the left lung base. No convincing confluent opacity otherwise. IMPRESSION: 1.  Stable lines and tubes. 2. More rotated, kyphotic view. Mild veiling opacity at the left lung base which may be artifact or small pleural effusion. Electronically Signed   By: Genevie Ann M.D.   On: 04/07/2017 09:30  I reviewed the images personally.   STUDIES:  CT Head 3/5 >> no acute abnormality   CULTURES: BCx2 3/5 >>  Sputum 3/5 >>  RVP 3/5 >> negative   ANTIBIOTICS: Unasyn 3/5 >>   SIGNIFICANT EVENTS: 3/05  Admit with AMS, hypothermia, hypertension, possible aspiration, ? overdose   LINES/TUBES: ETT 3/5 >>   DISCUSSION: 47 y/o F admitted 3/5 with AMS of unclear etiology.  UDS negative.  Hypothermic to 88 rectal on admit and hypertensive (MAP 126).  DDx includes overdose, infectious, hypertensive emergency  ASSESSMENT / PLAN:  PULMONARY A: Acute Respiratory Insufficiency - in setting of altered mental status  Pulmonary Edema - mild, noted on initial film ? Aspiration - prior to admit, emesis noted at cords on intubation  P:   Continue vent support Weaning trials as tolerated Follow chest x-ray.  CARDIOVASCULAR A:  HTN  Cardiomegaly - noted on initial CXR  Hypothermia - initial temp 88 P:  Start Norvasc, PRN hydralazine Follow EKG  RENAL A:   AKI - in setting of suspected prolonged down time, must also consider toxins, HTN Lactic Acidosis  Hypokalemia  Hypomagnesemia  Rhabdomyolysis - CK >50k on admit P:   Continue sodium bicarb Follow CT Consult nephrology as she likely needs dialysis  GASTROINTESTINAL A:   Morbid Obesity Elevated LFTs, likely shock liver > improving  P:   Continue tube feeds, Pepcid  HEMATOLOGIC A:   DVT Prophylaxis  P:  SCD's + SQ Heparin for DVT prophylaxis  Trend CBC    INFECTIOUS A:   Suspected Aspiration  P:   Follow cultures Continue Unasyn  ENDOCRINE A:   Hyperglycemia - Hgb A1c 6.9 on admit  TSH is normal P:   SSI coverage  NEUROLOGIC A:   Acute Metabolic Encephalopathy - in setting of overdose Suspected Suicide Attempt - ETOH, tylenol, salicylate negative  Bipolar Disorder  Schizophrenia  P:   RASS goal: 0 to -1  Propofol drip for sedation.  Wean down. PRN fentanyl for  pain.   FAMILY  - Updates:  Mom updated on admission. No family at bedside 3/7. - Inter-disciplinary family meet or Palliative Care meeting due by:  3/10  The patient is critically ill with multiple organ system failure and requires high complexity decision making for assessment and support, frequent evaluation and titration of therapies, advanced monitoring, review of radiographic studies and interpretation of complex data.   Critical Care Time devoted to patient care services, exclusive of separately billable procedures, described in this note is 35 minutes.   Marshell Garfinkel MD [facility] Pager [phone]  [phone] If no answer or after 3pm call: [phone] 04/07/2017, 10:08 AM

## 2017-04-07 NOTE — Progress Notes (Signed)
Pt arrived on [facility] vent from [employer].  Pt placed on new vent.  Vitals stable.

## 2017-04-07 NOTE — Consult Note (Signed)
Renal Service Consult Note [facility]  Jenna Hill 04/07/2017 [place] Requesting Physician:  Dr Vaughan Browner  Reason for Consult:  AKI HPI: The patient is a 47 y.o. year-old with hx of severe psych illness on multiple psych medications.  Presented to [facility] ED after being found unresponsive.  In the ED she was hypothermic and hypertensive with AMS, lactic acidosis and AKI w/ creat 2.7.  CPK returned > 50,000. Intubated and admitted to ICU.  Overnight UOP is very poor, creat up to 5 today.  Asked to see for AKI.    No family here to provide history. She has had a few admissions here over the last 8 years - severe depression x 2-3, allergic reaction/ hives and a GYN procedure.  No hx renal failure. Last creat here was Feb 2018 , creat 0.79.     ROS  n/a   Past Medical History  Past Medical History:  Diagnosis Date  . Bipolar 1 disorder ([place])   . Hypertension   . Schizophrenia [facility])    Past Surgical History  Past Surgical History:  Procedure Laterality Date  . ANKLE ARTHROSCOPY Right   . CESAREAN SECTION    . HAND SURGERY Right   . ORIF ANKLE FRACTURE Left 01/29/2016   Procedure: OPEN REDUCTION INTERNAL FIXATION LEFT ANKLE BIMALLEOLAR FRACTURE AND SYNDESMOSIS;  Surgeon: Wylene Simmer, MD;  Location: [place];  Service: Orthopedics;  Laterality: Left;   Family History No family history on file. Social History  reports that  has never smoked. she has never used smokeless tobacco. She reports that she drinks alcohol. She reports that she does not use drugs. Allergies  Allergies  Allergen Reactions  . Bee Venom Anaphylaxis  . Bretylium     Other reaction(s): Seizures  . Codeine Anaphylaxis  . Other Anaphylaxis    bertrillium or bertillium    . Toradol [Ketorolac Tromethamine] Swelling  . Ibuprofen     Raised hives   . Latex Rash   Home medications Prior to Admission medications   Medication Sig Start Date End Date Taking? Authorizing  Provider  ABILIFY MAINTENA 400 MG PRSY Inject 400 mg into the skin every 30 (thirty) days. 12/18/15 12/16/15  Yes [provider]  clozapine (CLOZARIL) 200 MG tablet Take 200 mg by mouth at bedtime. For one week and then increase to two tablets every night at bedtime   Yes [provider]  doxepin (SINEQUAN) 50 MG capsule Take 50-100 mg by mouth at bedtime.   Yes [provider]  EPINEPHrine 0.3 mg/0.3 mL IJ SOAJ injection Inject 0.3 mLs (0.3 mg total) into the muscle daily as needed (allergic reaction). 04/23/15  Yes Hildred Priest, MD  ESZOPICLONE 3 MG tablet Take 3 mg by mouth at bedtime. 12/16/15  Yes [provider]  QUEtiapine (SEROQUEL XR) 400 MG 24 hr tablet Take 400 mg by mouth 2 (two) times daily.   Yes [provider]  TRINTELLIX 20 MG TABS tablet Take 20 mg by mouth daily. With food 12/19/15  Yes [provider]  acetaminophen (TYLENOL) 500 MG tablet Take 2 tablets (1,000 mg total) by mouth every 6 (six) hours as needed for mild pain or moderate pain. Patient not taking: Reported on 04/06/2017 03/05/16   Alfonse Spruce, FNP  amLODipine (NORVASC) 5 MG tablet Take 1 tablet (5 mg total) by mouth daily. Patient not taking: Reported on 04/06/2017 03/05/16   Alfonse Spruce, FNP  aspirin EC 81 MG tablet Take  1 tablet (81 mg total) by mouth 2 (two) times daily. Patient not taking: Reported on 04/06/2017 01/29/16   Wylene Simmer, MD  hydrochlorothiazide (HYDRODIURIL) 12.5 MG tablet Take 2 tablets (25 mg total) by mouth daily. Patient not taking: Reported on 04/06/2017 03/05/16   Alfonse Spruce, FNP  loratadine (ALLERGY RELIEF) 10 MG tablet Take 10 mg by mouth daily.    [provider]   Liver Function Tests Recent Labs  Lab 04/05/17 1355 04/06/17 0306 04/07/17 0247  AST 349* 805* 733*  ALT 184* 331* 311*  ALKPHOS 41 34* 35*  BILITOT 0.6 0.6 0.7  PROT 7.6 5.5* 5.2*  ALBUMIN 3.8 2.6* 2.2*   Recent Labs  Lab  04/05/17 1355  LIPASE 207*   CBC Recent Labs  Lab 04/05/17 1355 04/05/17 1400 04/06/17 0306 04/07/17 0247  WBC 11.8*  --  15.9* 17.7*  HGB 15.6* 16.0* 14.5 12.8  HCT 44.9 47.0* 42.4 38.5  MCV 86.5  --  87.1 88.9  PLT 268  --  286 725   Basic Metabolic Panel Recent Labs  Lab 04/05/17 1355 04/05/17 1400 04/06/17 0306 04/07/17 0247  NA 138 140 143 142  K 2.7* 2.7* 4.0 3.4*  CL 101 101 110 106  CO2 15*  --  22 22  GLUCOSE 322* 317* 123* 155*  BUN 21* 23* 26* 48*  CREATININE 2.01* 2.00* 2.94* 5.22*  CALCIUM 8.6*  --  7.5* 7.0*  PHOS  --   --  4.6  --    Iron/TIBC/Ferritin/ %Sat No results found for: IRON, TIBC, FERRITIN, IRONPCTSAT  Vitals:   04/07/17 1500 04/07/17 1505 04/07/17 1532 04/07/17 1600  BP: (!) 153/97 (!) 153/97  (!) 161/102  Pulse:   (!) 112   Resp: 19  19 18   Temp: 98.4 F (36.9 C)   98.4 F (36.9 C)  TempSrc:    Core (Comment)  SpO2:   100% 100%  Weight:      Height:       Exam Gen obese young AAF sedated on ventilator, not responding No rash, cyanosis or gangrene Sclera anicteric, throat w ETT  No jvd or bruits Chest occ rhonchi, mostly clear RRR no MRG Abd soft ntnd no mass or ascites +bs obese GU foley cath draining clear small amts of yellow urine MS no joint effusions or deformity Ext 2-3+  RLE/ hip edema, 1-2+ LLE edema Neuro is sedated on vent   UA - yellow, neg Hb/ LE/ nitrite, 5.0 , prot 30, 1.021, 0-5 wbc/ rbc ECHO - mild LVH, o/w normal study CXR - "Left central venous catheter tip projects over lower SVC. Otherwise stable lines and tubes. Stable hazy opacities of lungs." UDS negative, etoh/ salicylate negative, HIV neg ABG 7.4/ 27/ 121 Na 142  K 3.4  BUN 48  Cr 5.22  CO2 22  Alb 2.2   CPK > 50,000 WBC 17k    Hb 12.8  plt 217  Home meds: - abilify injection monthly / clozapine bid/ doxepin hs/ seroquel xr bid/ trintellix qd / eszopiclone prn - HCTZ 25 qd/ ecasa      Impression: 1  Acute renal failure - likely ATN  from severe rhabdo. Minimal UOP, creat rising rapidly, K + ok.  Looks vol overloaded on exam.  CXR haziness may be effusion or edema, not sure. BP's high.  Is heading towards RRT soon, have d/w CCM.  Will be transferred to [facility] tonight.  Check urine lytes. DC IVF's, ATN is established.  Will  follow.  2  Severe depression/ schizoaffective disorder 3  VDRF 4  AMS 5  HTN - mild issue it appears, was on HCTZ only   Plan - as above  Kelly Splinter MD [employer] pager [phone]   04/07/2017, 4:22 PM

## 2017-04-07 NOTE — Procedures (Signed)
Hemodialysis Catheter Insertion Procedure Note Jenna Hill [id number] 1970/06/05  Procedure: Insertion of Hemodialysis Catheter Indications: Hemodialysis  Procedure Details Consent: Risks of procedure as well as the alternatives and risks of each were explained to the (patient/caregiver).  Consent for procedure obtained.  Time Out: Verified patient identification, verified procedure, site/side was marked, verified correct patient position, special equipment/implants available, medications/allergies/relevent history reviewed, required imaging and test results available.  Performed  Maximum sterile technique was used including antiseptics, cap, gloves, gown, hand hygiene, mask and sheet.  Skin prep: Chlorhexidine; local anesthetic administered  A Trialysis HD catheter was placed in the left internal jugular vein to 19 cm using the Seldinger technique.  Biopatch applied, sutured in place.   Evaluation Blood flow good Complications: No apparent complications Patient did tolerate procedure well. Chest X-ray ordered to verify placement.  CXR: pending.   Procedure performed under direct supervision of Dr. Vaughan Browner and with ultrasound guidance for real time vessel cannulation.     Jenna Gens, NP-C [facility] Pgr: [phone] or [phone] 04/07/2017, 1:46 PM

## 2017-04-08 ENCOUNTER — Inpatient Hospital Stay (HOSPITAL_COMMUNITY): Payer: Medicaid Other

## 2017-04-08 DIAGNOSIS — M6282 Rhabdomyolysis: Secondary | ICD-10-CM

## 2017-04-08 DIAGNOSIS — J9600 Acute respiratory failure, unspecified whether with hypoxia or hypercapnia: Secondary | ICD-10-CM

## 2017-04-08 DIAGNOSIS — N179 Acute kidney failure, unspecified: Secondary | ICD-10-CM

## 2017-04-08 LAB — BLOOD GAS, ARTERIAL
Acid-base deficit: 2 mmol/L (ref 0.0–2.0)
BICARBONATE: 21.9 mmol/L (ref 20.0–28.0)
DRAWN BY: 41977
FIO2: 30
LHR: 16 {breaths}/min
MECHVT: 480 mL
O2 Saturation: 96 %
PEEP: 5 cmH2O
Patient temperature: 98.6
pCO2 arterial: 35.3 mmHg (ref 32.0–48.0)
pH, Arterial: 7.409 (ref 7.350–7.450)
pO2, Arterial: 91.1 mmHg (ref 83.0–108.0)

## 2017-04-08 LAB — MAGNESIUM: MAGNESIUM: 2.2 mg/dL (ref 1.7–2.4)

## 2017-04-08 LAB — PHOSPHORUS: Phosphorus: 7.5 mg/dL — ABNORMAL HIGH (ref 2.5–4.6)

## 2017-04-08 LAB — GLUCOSE, CAPILLARY
GLUCOSE-CAPILLARY: 107 mg/dL — AB (ref 65–99)
GLUCOSE-CAPILLARY: 84 mg/dL (ref 65–99)
GLUCOSE-CAPILLARY: 95 mg/dL (ref 65–99)
GLUCOSE-CAPILLARY: 97 mg/dL (ref 65–99)
Glucose-Capillary: 60 mg/dL — ABNORMAL LOW (ref 65–99)
Glucose-Capillary: 94 mg/dL (ref 65–99)
Glucose-Capillary: 98 mg/dL (ref 65–99)

## 2017-04-08 LAB — COMPREHENSIVE METABOLIC PANEL
ALT: 240 U/L — ABNORMAL HIGH (ref 14–54)
ANION GAP: 17 — AB (ref 5–15)
AST: 492 U/L — AB (ref 15–41)
Albumin: 1.9 g/dL — ABNORMAL LOW (ref 3.5–5.0)
Alkaline Phosphatase: 47 U/L (ref 38–126)
BILIRUBIN TOTAL: 0.7 mg/dL (ref 0.3–1.2)
BUN: 70 mg/dL — AB (ref 6–20)
CO2: 21 mmol/L — ABNORMAL LOW (ref 22–32)
Calcium: 6.8 mg/dL — ABNORMAL LOW (ref 8.9–10.3)
Chloride: 103 mmol/L (ref 101–111)
Creatinine, Ser: 7.49 mg/dL — ABNORMAL HIGH (ref 0.44–1.00)
GFR calc Af Amer: 7 mL/min — ABNORMAL LOW (ref >60)
GFR, EST NON AFRICAN AMERICAN: 6 mL/min — AB (ref >60)
Glucose, Bld: 125 mg/dL — ABNORMAL HIGH (ref 65–99)
Potassium: 3.4 mmol/L — ABNORMAL LOW (ref 3.5–5.1)
Sodium: 141 mmol/L (ref 135–145)
TOTAL PROTEIN: 7.2 g/dL (ref 6.5–8.1)

## 2017-04-08 MED ORDER — VORTIOXETINE HBR 10 MG PO TABS
20.0000 mg | ORAL_TABLET | Freq: Every day | ORAL | Status: DC
  Administered 2017-04-09 – 2017-04-28 (×21): 20 mg via ORAL
  Filled 2017-04-08 (×6): qty 2
  Filled 2017-04-08: qty 1
  Filled 2017-04-08 (×18): qty 2

## 2017-04-08 NOTE — Procedures (Signed)
Extubation Procedure Note  Patient Details:   Name: Jenna Hill DOB: Jun 04, 1970 MRN: [id number]   Airway Documentation:     Evaluation  O2 sats: stable throughout Complications: No apparent complications Patient did tolerate procedure well. Bilateral Breath Sounds: Clear, Diminished   Yes   Positive cuff leak noted.  Pt placed on [place] 4 L with humidity and is tolerating well, no stridor noted.    Bayard Beaver 04/08/2017, 9:54 AM

## 2017-04-08 NOTE — Progress Notes (Signed)
Rehab Admissions Coordinator Note:  Patient was screened by Retta Diones for appropriateness for an Inpatient Acute Rehab Consult. Will await PT/OT evaluations and recommendations.  Will follow up on Monday.  Retta Diones 04/08/2017, 3:59 PM  I can be reached at [phone].

## 2017-04-08 NOTE — Progress Notes (Signed)
Dr. Nelda Marseille notified that patient complaining of pain in her right foot shooting down from thigh and foot numbness. Stated probably from rhabdo and to make sure a CKP was drawn in the morning. If not improved, plan to receive a scan.

## 2017-04-08 NOTE — Progress Notes (Signed)
Arrived to patient room 4N-17 at 1433.  Reviewed treatment plan and this RN agrees with plan.  Report received from bedside RN, Taryn.  Consent obtained.  Patient A & O X 4.   Lung sounds clear and diminished to ausculation in all fields. Generalized +1 pitting edema. Cardiac:  ST.  Removed caps and cleansed LIJ catheter with chlorhedxidine.  Aspirated ports of heparin and flushed them with saline per protocol.  Connected and secured lines, initiated treatment at 1445.  UF Goal of 3000 mL and net fluid removal 2.5 L.  Will continue to monitor.

## 2017-04-08 NOTE — Progress Notes (Signed)
Pharmacy asked to review home psychiatric medications and risks of resuming in ongoing rhabdomyolysis.  At home, patient was on: Abilify [place] (dose likely due soon) Clozapine 200mg  qhs Doxapin 50-100mg  qhs Eszopiclone 3mg  qhs Seroquel XR 400mg  BID Trintellix 20mg  daily  These drugs directly contributing to rhabdomyolysis is not common, however this combination (and clozapine and Seroquel in particular) could be a risk for neuroleptic malignant syndrome.  Spoke with Dr. Jimmy Footman at [facility] who agrees that restarting all of these medications would be risky/concerning. Recommended Trintellix to restart tonight- she agreed, will enter order.  Dr. Jimmy Footman deferred resumption of any other medications to the team in the morning. Pharmacy will sign off as recommendations from above will not change.   Jenna Hill, PharmD, [place] Clinical Pharmacist [phone] 04/08/2017 9:39 PM

## 2017-04-08 NOTE — Progress Notes (Signed)
Dialysis treatment completed.  3000 mL ultrafiltrated.  2500 mL net fluid removal.  Patient status unchanged. Lung sounds diminished and clear to ausculation in all fields. Generalized edema. Cardiac: ST.  Cleansed LIJ catheter with chlorhexidine.  Disconnected lines and flushed ports with saline per protocol.  Ports locked with heparin and capped per protocol.    Report given to bedside, RN Taryn.

## 2017-04-08 NOTE — Progress Notes (Signed)
[facility] Progress Note  Subjective: off the vent, no sig UOP  Vitals:   04/08/17 0700 04/08/17 0738 04/08/17 0800 04/08/17 0951  BP: 132/76 132/76  (!) 149/97  Pulse:    (!) 113  Resp: 20     Temp:   99.5 F (37.5 C)   TempSrc:   Axillary   SpO2:  100%  98%  Weight:      Height:        Inpatient medications: . amLODipine  5 mg Oral Daily  . chlorhexidine gluconate (MEDLINE KIT)  15 mL Mouth Rinse BID  . feeding supplement (PRO-STAT SUGAR FREE 64)  60 mL Per Tube TID  . feeding supplement (VITAL HIGH PROTEIN)  1,000 mL Per Tube Q24H  . fentaNYL (SUBLIMAZE) injection  50 mcg Intravenous Once  . heparin injection (subcutaneous)  5,000 Units Subcutaneous Q8H  . insulin aspart  0-15 Units Subcutaneous Q4H  . mouth rinse  15 mL Mouth Rinse Q2H  . ranitidine  150 mg Oral BID   . sodium chloride    . ampicillin-sulbactam (UNASYN) IV Stopped (04/08/17 0600)  . propofol (DIPRIVAN) infusion 20 mcg/kg/min (04/08/17 0700)   sodium chloride, acetaminophen, fentaNYL (SUBLIMAZE) injection, hydrALAZINE, ondansetron (ZOFRAN) IV  Exam: Gen obese young AAF, extubated and awake, a little groggy, Ox 3 +JVD Chest occ rhonchi, mostly clear RRR no MRG Abd soft ntnd no mass or ascites +bs obese GU foley cath draining small amts of clear brownish urine MS no joint effusions or deformity Ext 2-3+  RLE/ hip edema, 1-2+ LLE edema Neuro Ox 3, NF   UA - yellow, neg Hb/ LE/ nitrite, 5.0 , prot 30, 1.021, 0-5 wbc/ rbc ECHO - mild LVH, o/w normal study CXR - "Left central venous catheter tip projects over lower SVC. Otherwise stable lines and tubes. Stable hazy opacities of lungs." UDS negative, etoh/ salicylate negative, HIV neg   Home meds: - abilify injection monthly / clozapine bid/ doxepin hs/ seroquel xr bid/ trintellix qd / eszopiclone prn - HCTZ 25 qd/ ecasa      Impression: 1  Acute renal failure - found down, AKI due to  ATN from severe rhabdo. Minimal  UOP, creat up again today, plan HD today and tomorrow.  Await recovery of renal fxn, supportive care. Take vol down as tolerated.  2  Severe depression/ schizoaffective disorder - possible drug OD 3  VDRF - extubated now 4  AMS - better, Ox 3 5  HTN - BP's on higher side  Plan - as above   Kelly Splinter MD [facility] pager [phone]   04/08/2017, 11:40 AM   Recent Labs  Lab 04/06/17 0306 04/07/17 0247 04/08/17 0436  NA 143 142 141  K 4.0 3.4* 3.4*  CL 110 106 103  CO2 22 22 21*  GLUCOSE 123* 155* 125*  BUN 26* 48* 70*  CREATININE 2.94* 5.22* 7.49*  CALCIUM 7.5* 7.0* 6.8*  PHOS 4.6  --  7.5*   Recent Labs  Lab 04/06/17 0306 04/07/17 0247 04/08/17 0436  AST 805* 733* 492*  ALT 331* 311* 240*  ALKPHOS 34* 35* 47  BILITOT 0.6 0.7 0.7  PROT 5.5* 5.2* 7.2  ALBUMIN 2.6* 2.2* 1.9*   Recent Labs  Lab 04/05/17 1355 04/05/17 1400 04/06/17 0306 04/07/17 0247  WBC 11.8*  --  15.9* 17.7*  HGB 15.6* 16.0* 14.5 12.8  HCT 44.9 47.0* 42.4 38.5  MCV 86.5  --  87.1 88.9  PLT 268  --  286  217   Iron/TIBC/Ferritin/ %Sat No results found for: IRON, TIBC, FERRITIN, IRONPCTSAT

## 2017-04-08 NOTE — Progress Notes (Signed)
PULMONARY / CRITICAL CARE MEDICINE   Name: Jenna Hill MRN: [id number] DOB: 05-Jan-1971    ADMISSION DATE:  04/05/2017 CONSULTATION DATE:  04/05/17  REFERRING MD:  Dr. Leonette Monarch / EDP   CHIEF COMPLAINT:  AMS  BRIEF SUMMARY:   47 y/o F who presented to [facility] ER via EMS after being found unresponsive.  Prior hx of overdose / suicide attempt.  UDS negative on presentation.  Hypertensive, hypothermic, altered mental status, AKI with lactic acidosis. Found to have rhabdomyolysis with CK >50k.     SUBJECTIVE:   No acute events overnight.  Urine output is poor Worsening kidney function  VITAL SIGNS: BP 132/76   Pulse (!) 110   Temp 99.5 F (37.5 C) (Axillary)   Resp 20   Ht 5' 6.5" (1.689 m)   Wt 126 kg (277 lb 12.5 oz)   SpO2 100%   BMI 44.16 kg/m   HEMODYNAMICS:    VENTILATOR SETTINGS: Vent Mode: PSV;CPAP FiO2 (%):  [30 %] 30 % Set Rate:  [16 bmp] 16 bmp Vt Set:  [480 mL] 480 mL PEEP:  [5 cmH20] 5 cmH20 Pressure Support:  [5 cmH20] 5 cmH20 Plateau Pressure:  [13 cmH20] 13 cmH20  INTAKE / OUTPUT: I/O last 3 completed shifts: In: 6542.1 [I.V.:5900.1; NG/GT:442; IV Piggyback:200] Out: 65 [Urine:65]  PHYSICAL EXAMINATION: Gen:      Obese female resting in bed on propofol, NAD HEENT:  /AT, PERRL, EOM-I and MMM Neck:      No masses; no thyromegaly, ETT is in place Lungs:    CTA bilaterally CV:         Regular rate and rhythm; no murmurs Abd:      Soft, NT, ND and +BS Ext:    No edema; adequate peripheral perfusion Skin:      Warm and dry; no rash Neuro:   Arousable and interactive, on sedation  LABS:  BMET Recent Labs  Lab 04/06/17 0306 04/07/17 0247 04/08/17 0436  NA 143 142 141  K 4.0 3.4* 3.4*  CL 110 106 103  CO2 22 22 21*  BUN 26* 48* 70*  CREATININE 2.94* 5.22* 7.49*  GLUCOSE 123* 155* 125*    Electrolytes Recent Labs  Lab 04/05/17 1355 04/06/17 0306 04/07/17 0247 04/08/17 0436  CALCIUM 8.6* 7.5* 7.0* 6.8*  MG 2.2 1.6*  --  2.2  PHOS  --   4.6  --  7.5*    CBC Recent Labs  Lab 04/05/17 1355 04/05/17 1400 04/06/17 0306 04/07/17 0247  WBC 11.8*  --  15.9* 17.7*  HGB 15.6* 16.0* 14.5 12.8  HCT 44.9 47.0* 42.4 38.5  PLT 268  --  286 217    Coag's No results for input(s): APTT, INR in the last 168 hours.  Sepsis Markers Recent Labs  Lab 04/05/17 1401 04/05/17 1751 04/05/17 2122  LATICACIDVEN 10.54* 7.0* 4.2*    ABG Recent Labs  Lab 04/05/17 1525 04/06/17 0420 04/08/17 0340  PHART 7.408 7.400 7.409  PCO2ART 22.2* 27.0* 35.3  PO2ART 460* 121* 91.1    Liver Enzymes Recent Labs  Lab 04/06/17 0306 04/07/17 0247 04/08/17 0436  AST 805* 733* 492*  ALT 331* 311* 240*  ALKPHOS 34* 35* 47  BILITOT 0.6 0.7 0.7  ALBUMIN 2.6* 2.2* 1.9*    Cardiac Enzymes No results for input(s): TROPONINI, PROBNP in the last 168 hours.  Glucose Recent Labs  Lab 04/07/17 1152 04/07/17 1528 04/07/17 2058 04/08/17 0011 04/08/17 0417 04/08/17 0748  GLUCAP 167* 112* 109* 97 84 95  Imaging US Renal  Result Date: 04/07/2017 CLINICAL DATA:  Acute renal failure. EXAM: RENAL / URINARY TRACT ULTRASOUND COMPLETE COMPARISON:  None. FINDINGS: Right Kidney: Length: 10.9 cm. Echogenicity within normal limits. No mass or hydronephrosis visualized. Left Kidney: Length: 10.6 cm. Echogenicity within normal limits. No mass or hydronephrosis visualized. There is a 5 mm stone within the lower pole of the left kidney. Bladder: Decompressed. IMPRESSION: No hydronephrosis. 5 mm stone inferior pole left kidney. Electronically Signed   By: Lovey Newcomer M.D.   On: 04/07/2017 21:17   Dg Chest Port 1 View  Result Date: 04/08/2017 CLINICAL DATA:  Acute respiratory failure EXAM: PORTABLE CHEST 1 VIEW COMPARISON:  04/07/2017 FINDINGS: Endotracheal tube 2 cm above the carina. Left jugular central venous catheter tip in the right atrium unchanged. NG in the stomach. Left lower lobe airspace disease unchanged. Perihilar vascular congestion  bilaterally. IMPRESSION: Pulmonary vascular congestion may reflect mild fluid overload. Left lower lobe consolidation unchanged. Electronically Signed   By: Franchot Gallo M.D.   On: 04/08/2017 07:49   Dg Chest Port 1 View  Result Date: 04/07/2017 CLINICAL DATA:  47 y/o  F; central line placement. EXAM: PORTABLE CHEST 1 VIEW COMPARISON:  04/07/2017 chest radiograph FINDINGS: Left central line with tip projecting over lower SVC. Endotracheal tube tip projects 1.6 cm above carina. Enteric tube tip below field of view and abdomen. Stable cardiac silhouette. Stable hazy opacities of the lungs. IMPRESSION: Left central venous catheter tip projects over lower SVC. Otherwise stable lines and tubes. Stable hazy opacities of lungs. Electronically Signed   By: Kristine Garbe M.D.   On: 04/07/2017 14:39  I reviewed the images personally.   STUDIES:  CT Head 3/5 >> no acute abnormality   CULTURES: BCx2 3/5 >>  Sputum 3/5 >>  RVP 3/5 >> negative   ANTIBIOTICS: Unasyn 3/5 >>   SIGNIFICANT EVENTS: 3/05  Admit with AMS, hypothermia, hypertension, possible aspiration, ? overdose   LINES/TUBES: ETT 3/5 >>   DISCUSSION: 47 y/o F admitted 3/5 with AMS of unclear etiology.  UDS negative.  Hypothermic to 88 rectal on admit and hypertensive (MAP 126).  DDx includes overdose, infectious, hypertensive emergency  ASSESSMENT / PLAN:  PULMONARY A: Acute Respiratory Insufficiency - in setting of altered mental status  Pulmonary Edema - mild, noted on initial film ? Aspiration - prior to admit, emesis noted at cords on intubation  P:   Unsure when dialysis is going to be today but patient is weaning very well, will wean to extubate today OOB to chair Titrate O2 for sat of 88-92% PT evaluat IS  If RN concerned about swallowing will order SLP  CARDIOVASCULAR A:  HTN  Cardiomegaly - noted on initial CXR  Hypothermia - initial temp 88 P:  Norvasc, PRN hydralazine Tele monitoring  RENAL A:    AKI - in setting of suspected prolonged down time, must also consider toxins, HTN Lactic Acidosis  Hypokalemia  Hypomagnesemia  Rhabdomyolysis - CK >50k on admit P:   Continue sodium bicarb until dialysis Dialysis today per nephrology Follow CK  GASTROINTESTINAL A:   Morbid Obesity Elevated LFTs, likely shock liver > improving  P:   Hold TF for extubation LFTs in AM  HEMATOLOGIC A:   DVT Prophylaxis  P:  SCD's + SQ Heparin for DVT prophylaxis  Trend CBC    INFECTIOUS A:   Suspected Aspiration  P:   Follow cultures Continue Unasyn  ENDOCRINE A:   Hyperglycemia - Hgb A1c 6.9 on  admit  TSH is normal P:   SSI coverage CBG  NEUROLOGIC A:   Acute Metabolic Encephalopathy - in setting of overdose Suspected Suicide Attempt - ETOH, tylenol, salicylate negative  Bipolar Disorder  Schizophrenia  P:   RASS goal: 0 to -1  Propofol drip for sedation.  Wean to off for extubation today after RN is back from CT PRN fentanyl for pain.   FAMILY  - Updates:  No family to update bedside, patient updated. - Inter-disciplinary family meet or Palliative Care meeting due by:  3/10  The patient is critically ill with multiple organ systems failure and requires high complexity decision making for assessment and support, frequent evaluation and titration of therapies, application of advanced monitoring technologies and extensive interpretation of multiple databases.   Critical Care Time devoted to patient care services described in this note is  35  Minutes. This time reflects time of care of this signee Dr Jennet Maduro. This critical care time does not reflect procedure time, or teaching time or supervisory time of PA/NP/Med student/Med Resident etc but could involve care discussion time.  Rush Farmer, M.D. [facility] Pulmonary/Critical Care Medicine. Pager: [phone]. After hours pager: [phone].  04/08/2017, 8:59 AM

## 2017-04-09 ENCOUNTER — Inpatient Hospital Stay (HOSPITAL_COMMUNITY): Payer: Medicaid Other

## 2017-04-09 LAB — HEPATITIS B CORE ANTIBODY, IGM: Hep B C IgM: NEGATIVE

## 2017-04-09 LAB — BASIC METABOLIC PANEL
ANION GAP: 16 — AB (ref 5–15)
BUN: 54 mg/dL — AB (ref 6–20)
CHLORIDE: 97 mmol/L — AB (ref 101–111)
CO2: 24 mmol/L (ref 22–32)
Calcium: 7 mg/dL — ABNORMAL LOW (ref 8.9–10.3)
Creatinine, Ser: 6.71 mg/dL — ABNORMAL HIGH (ref 0.44–1.00)
GFR calc Af Amer: 8 mL/min — ABNORMAL LOW (ref >60)
GFR calc non Af Amer: 7 mL/min — ABNORMAL LOW (ref >60)
GLUCOSE: 123 mg/dL — AB (ref 65–99)
POTASSIUM: 3.5 mmol/L (ref 3.5–5.1)
SODIUM: 137 mmol/L (ref 135–145)

## 2017-04-09 LAB — CBC
HCT: 29 % — ABNORMAL LOW (ref 36.0–46.0)
HEMOGLOBIN: 9.9 g/dL — AB (ref 12.0–15.0)
MCH: 29.4 pg (ref 26.0–34.0)
MCHC: 34.1 g/dL (ref 30.0–36.0)
MCV: 86.1 fL (ref 78.0–100.0)
Platelets: 182 10*3/uL (ref 150–400)
RBC: 3.37 MIL/uL — AB (ref 3.87–5.11)
RDW: 14.2 % (ref 11.5–15.5)
WBC: 16.2 10*3/uL — AB (ref 4.0–10.5)

## 2017-04-09 LAB — HEPATIC FUNCTION PANEL
ALBUMIN: 2 g/dL — AB (ref 3.5–5.0)
ALT: 233 U/L — ABNORMAL HIGH (ref 14–54)
AST: 423 U/L — AB (ref 15–41)
Alkaline Phosphatase: 92 U/L (ref 38–126)
BILIRUBIN TOTAL: 1 mg/dL (ref 0.3–1.2)
Bilirubin, Direct: 0.4 mg/dL (ref 0.1–0.5)
Indirect Bilirubin: 0.6 mg/dL (ref 0.3–0.9)
TOTAL PROTEIN: 5.5 g/dL — AB (ref 6.5–8.1)

## 2017-04-09 LAB — GLUCOSE, CAPILLARY
GLUCOSE-CAPILLARY: 107 mg/dL — AB (ref 65–99)
GLUCOSE-CAPILLARY: 131 mg/dL — AB (ref 65–99)
GLUCOSE-CAPILLARY: 136 mg/dL — AB (ref 65–99)
GLUCOSE-CAPILLARY: 75 mg/dL (ref 65–99)
Glucose-Capillary: 50 mg/dL — ABNORMAL LOW (ref 65–99)
Glucose-Capillary: 91 mg/dL (ref 65–99)
Glucose-Capillary: 95 mg/dL (ref 65–99)

## 2017-04-09 LAB — HEPATITIS B SURFACE ANTIGEN: Hepatitis B Surface Ag: NEGATIVE

## 2017-04-09 LAB — CK: CK TOTAL: 37372 U/L — AB (ref 38–234)

## 2017-04-09 LAB — TRIGLYCERIDES: TRIGLYCERIDES: 188 mg/dL — AB (ref <150)

## 2017-04-09 LAB — HEPATITIS B SURFACE ANTIBODY,QUALITATIVE: Hep B S Ab: REACTIVE

## 2017-04-09 LAB — PHOSPHORUS: Phosphorus: 6.8 mg/dL — ABNORMAL HIGH (ref 2.5–4.6)

## 2017-04-09 LAB — MAGNESIUM: MAGNESIUM: 2 mg/dL (ref 1.7–2.4)

## 2017-04-09 MED ORDER — CHLORHEXIDINE GLUCONATE CLOTH 2 % EX PADS
6.0000 | MEDICATED_PAD | Freq: Every day | CUTANEOUS | Status: DC
Start: 2017-04-09 — End: 2017-04-29
  Administered 2017-04-09 – 2017-04-29 (×19): 6 via TOPICAL

## 2017-04-09 MED ORDER — AMPICILLIN-SULBACTAM SODIUM 3 (2-1) G IJ SOLR
3.0000 g | INTRAMUSCULAR | Status: DC
  Administered 2017-04-09 – 2017-04-10 (×2): 3 g via INTRAVENOUS
  Filled 2017-04-09 (×5): qty 3

## 2017-04-09 MED ORDER — ACETAMINOPHEN 325 MG PO TABS
650.0000 mg | ORAL_TABLET | Freq: Four times a day (QID) | ORAL | Status: DC | PRN
  Administered 2017-04-09 – 2017-04-29 (×19): 650 mg via ORAL
  Filled 2017-04-09 (×18): qty 2

## 2017-04-09 MED ORDER — DEXTROSE 50 % IV SOLN
25.0000 mL | Freq: Once | INTRAVENOUS | Status: AC
  Administered 2017-04-09: 25 mL via INTRAVENOUS

## 2017-04-09 MED ORDER — CHLORHEXIDINE GLUCONATE CLOTH 2 % EX PADS: 6.0000 | MEDICATED_PAD | Freq: Every day | CUTANEOUS | Status: DC

## 2017-04-09 MED ORDER — FENTANYL CITRATE (PF) 100 MCG/2ML IJ SOLN
25.0000 ug | INTRAMUSCULAR | Status: DC | PRN
Start: 2017-04-09 — End: 2017-04-11
  Administered 2017-04-09 – 2017-04-11 (×12): 25 ug via INTRAVENOUS
  Filled 2017-04-09 (×11): qty 2

## 2017-04-09 MED ORDER — LOPERAMIDE HCL 2 MG PO CAPS
ORAL_CAPSULE | ORAL | Status: AC
  Filled 2017-04-09: qty 1

## 2017-04-09 MED ORDER — DEXTROSE 5 % IV SOLN
INTRAVENOUS | Status: DC
  Administered 2017-04-09 – 2017-04-10 (×2): via INTRAVENOUS
  Administered 2017-04-11: 50 mL via INTRAVENOUS
  Administered 2017-04-13: 1000 mL via INTRAVENOUS

## 2017-04-09 MED ORDER — DEXTROSE-NACL 5-0.9 % IV SOLN
INTRAVENOUS | Status: DC
  Administered 2017-04-09: via INTRAVENOUS

## 2017-04-09 MED ORDER — ACETAMINOPHEN 325 MG PO TABS
ORAL_TABLET | ORAL | Status: AC
  Administered 2017-04-09: 650 mg via ORAL
  Filled 2017-04-09: qty 2

## 2017-04-09 MED ORDER — ONDANSETRON HCL 4 MG/2ML IJ SOLN
INTRAMUSCULAR | Status: AC
  Administered 2017-04-09: 4 mg via INTRAVENOUS
  Filled 2017-04-09: qty 2

## 2017-04-09 MED ORDER — DEXTROSE 50 % IV SOLN
INTRAVENOUS | Status: AC
  Administered 2017-04-09: 25 mL via INTRAVENOUS
  Filled 2017-04-09: qty 50

## 2017-04-09 MED ORDER — LOPERAMIDE HCL 2 MG PO CAPS
2.0000 mg | ORAL_CAPSULE | ORAL | Status: DC | PRN
  Administered 2017-04-09 (×3): 2 mg via ORAL
  Filled 2017-04-09 (×2): qty 1

## 2017-04-09 NOTE — Progress Notes (Signed)
Hypoglycemic Event  CBG: 60   Treatment: 15 GM carbohydrate snack  Symptoms: Pale and None  Follow-up CBG: Time:0012 CBG Result:50  Possible Reasons for Event: Inadequate meal intake  Comments/MD notified: Notified Dr. Jimmy Footman of 1st CBG of 60. MD ordered D5NS IVF@ 50 ml/hr 2nd CBG 50. 25 ml of Dextrose given IV. CBG 131 at [place], [place]

## 2017-04-09 NOTE — Progress Notes (Signed)
PULMONARY / CRITICAL CARE MEDICINE   Name: Jenna Hill MRN: [id number] DOB: 01-08-1971    ADMISSION DATE:  04/05/2017 CONSULTATION DATE:  04/05/17  REFERRING MD:  Dr. Leonette Monarch / EDP   CHIEF COMPLAINT:  AMS  BRIEF SUMMARY:   47 y/o F who presented to [facility] ER via EMS after being found unresponsive.  Prior hx of overdose / suicide attempt.  UDS negative on presentation.  Hypertensive, hypothermic, altered mental status, AKI with lactic acidosis. Found to have rhabdomyolysis with CK >50k.   SUBJECTIVE:  Pt reports ongoing right hip pain and left hip / leg swelling.  Denies hallucinations / psy issues currently.  Reports her PSY meds had been adjusted the day prior to admit.  She denies suicidal intent.   VITAL SIGNS: BP (!) 135/42   Pulse (!) 112   Temp 98.9 F (37.2 C) (Oral)   Resp 13   Ht 5' 6.5" (1.689 m)   Wt 273 lb 9.5 oz (124.1 kg)   SpO2 98%   BMI 43.50 kg/m   HEMODYNAMICS:    VENTILATOR SETTINGS:    INTAKE / OUTPUT: I/O last 3 completed shifts: In: 1531.7 [P.O.:600; I.V.:514.2; NG/GT:217.5; IV Piggyback:200] Out: 1607 [Urine:45; Other:2500]  PHYSICAL EXAMINATION: General: adult female in NAD HEENT: MM pink/moist, no jvd, L IJ HD cath  PSY: calm/apppropriate  Neuro: AAOx4, speech clear, MAE  CV: s1s2 rrr, no m/r/g PULM: even/non-labored, lungs bilaterally diminished but clear  PX:TGGY, non-tender, bsx4 active  Extremities: warm/dry, RLE hip/thigh pitting edema (? If she laid on that side while down) Skin: no rashes or lesions  LABS:  BMET Recent Labs  Lab 04/07/17 0247 04/08/17 0436 04/09/17 0601  NA 142 141 137  K 3.4* 3.4* 3.5  CL 106 103 97*  CO2 22 21* 24  BUN 48* 70* 54*  CREATININE 5.22* 7.49* 6.71*  GLUCOSE 155* 125* 123*    Electrolytes Recent Labs  Lab 04/06/17 0306 04/07/17 0247 04/08/17 0436 04/09/17 0601  CALCIUM 7.5* 7.0* 6.8* 7.0*  MG 1.6*  --  2.2 2.0  PHOS 4.6  --  7.5* 6.8*    CBC Recent Labs  Lab 04/06/17 0306  04/07/17 0247 04/09/17 0601  WBC 15.9* 17.7* 16.2*  HGB 14.5 12.8 9.9*  HCT 42.4 38.5 29.0*  PLT 286 217 182    Coag's No results for input(s): APTT, INR in the last 168 hours.  Sepsis Markers Recent Labs  Lab 04/05/17 1401 04/05/17 1751 04/05/17 2122  LATICACIDVEN 10.54* 7.0* 4.2*    ABG Recent Labs  Lab 04/05/17 1525 04/06/17 0420 04/08/17 0340  PHART 7.408 7.400 7.409  PCO2ART 22.2* 27.0* 35.3  PO2ART 460* 121* 91.1    Liver Enzymes Recent Labs  Lab 04/07/17 0247 04/08/17 0436 04/09/17 0601  AST 733* 492* 423*  ALT 311* 240* 233*  ALKPHOS 35* 47 92  BILITOT 0.7 0.7 1.0  ALBUMIN 2.2* 1.9* 2.0*    Cardiac Enzymes No results for input(s): TROPONINI, PROBNP in the last 168 hours.  Glucose Recent Labs  Lab 04/08/17 2349 04/09/17 0012 04/09/17 0043 04/09/17 0346 04/09/17 0757 04/09/17 1145  GLUCAP 60* 50* 131* 107* 91 136*    Imaging No results found.I reviewed the images personally.   STUDIES:  CT Head 3/5 >> no acute abnormality   CULTURES: BCx2 3/5 >>  Sputum 3/5 >>  RVP 3/5 >> negative   ANTIBIOTICS: Unasyn 3/5 >>   SIGNIFICANT EVENTS: 3/05  Admit with AMS, hypothermia, hypertension, possible aspiration, ? overdose   LINES/TUBES:  ETT 3/5 >> 3/8  DISCUSSION: 47 y/o F admitted 3/5 with AMS of unclear etiology.  UDS negative.  Hypothermic to 88 rectal on admit and hypertensive (MAP 126).  DDx includes overdose, infectious, hypertensive emergency.  Work up consistent with rhabdomyolysis and AKI.  Patient reports PSY meds adjusted day prior to admit and feels it "knocked" her out.  Was given Abilify.    ASSESSMENT / PLAN:  PULMONARY A: Acute Respiratory Insufficiency - in setting of altered mental status  Pulmonary Edema - mild, noted on initial film ? Aspiration - prior to admit, emesis noted at cords on intubation  P:   Pulmonary hygiene - IS, mobilize O2 if needed to support sats > 90% OOB to chair  PT efforts    CARDIOVASCULAR A:  HTN  Cardiomegaly - noted on initial CXR  Hypothermia - initial temp 88 P:  Continue home norvasc Tele monitoring   RENAL A:   AKI - in setting of suspected prolonged down time, must also consider toxins, HTN Lactic Acidosis  Hypokalemia  Hypomagnesemia  Rhabdomyolysis - CK >50k on admit P:   HD per Nephrology, appreciate assistance  Trend BMP / urinary output Replace electrolytes as indicated Avoid nephrotoxic agents, ensure adequate renal perfusion  GASTROINTESTINAL A:   Morbid Obesity Elevated LFTs, likely shock liver > improving P:   Trend LFT's > improving  Diet as tolerated   HEMATOLOGIC A:   DVT Prophylaxis  P:  SCD's + SQ Heparin for DVT prophylaxis  Trend CBC   INFECTIOUS A:   Suspected Aspiration  P:   Follow cultures Stop date added to Unasyn   ENDOCRINE A:   Hyperglycemia - Hgb A1c 6.9 on admit  TSH is normal P:   D5w for hypoglycemia  SSI if needed  CBG monitoring   NEUROLOGIC A:   Acute Metabolic Encephalopathy - in setting of overdose Suspected Suicide Attempt - ETOH, tylenol, salicylate negative  Bipolar Disorder  Schizophrenia  RLE Pain - r/o compartment syndrome, injury  P:   RASS goal: 0 to -1   PRN Tylenol for pain  PRN Fentanyl low dose for severe pain  CT R hip, thigh given swelling and pain, ? If patient laid on the right side while down  May need PSY input regarding meds.  She has an outpatient psychiatrist and good relationship.     FAMILY  - Updates:  Patient updated 3/9 on plan of care  - Global:  Transfer to SDU.  TRH as ov 3/10 am.   [name], NP-C [facility] Pgr: [phone] or if no answer [phone] 04/09/2017, 12:05 PM

## 2017-04-09 NOTE — Progress Notes (Signed)
[facility] Progress Note  Subjective: c/o pain R hip area, minimal recorded UOP  Vitals:   04/09/17 0600 04/09/17 0700 04/09/17 0800 04/09/17 0900  BP: (!) 142/89 134/74 (!) 144/91 (!) 146/92  Pulse: (!) 115 (!) 110 (!) 110 (!) 117  Resp: (!) 22 18 18 20   Temp:   98.9 F (37.2 C)   TempSrc:   Oral   SpO2: 100% 100% 100% 98%  Weight:      Height:        Inpatient medications: . amLODipine  5 mg Oral Daily  . Chlorhexidine Gluconate Cloth  6 each Topical Q0600  . fentaNYL (SUBLIMAZE) injection  50 mcg Intravenous Once  . heparin injection (subcutaneous)  5,000 Units Subcutaneous Q8H  . insulin aspart  0-15 Units Subcutaneous Q4H  . ranitidine  150 mg Oral BID  . vortioxetine HBr  20 mg Oral QHS   . ampicillin-sulbactam (UNASYN) IV Stopped (04/09/17 4665)  . dextrose 5 % and 0.9% NaCl 50 mL/hr at 04/09/17 0621  . propofol (DIPRIVAN) infusion Stopped (04/08/17 0930)   acetaminophen, fentaNYL (SUBLIMAZE) injection, hydrALAZINE, loperamide, ondansetron (ZOFRAN) IV  Exam: Gen obese young AAF, lying flat, no distress Chest occ rhonchi, mostly clear RRR no MRG Abd soft ntnd no mass or ascites +bs obese GU foley cath draining small amts of clear brownish urine MS no joint effusions or deformity Ext 2+ diffuse LE edema Neuro Ox 3, NF   Home meds: - abilify injection monthly / clozapine bid/ doxepin hs/ seroquel xr bid/ trintellix qd / eszopiclone prn - HCTZ 25 qd/ ecasa   UA - yellow, neg Hb/ LE/ nitrite, 5.0 , prot 30, 1.021, 0-5 wbc/ rbc ECHO - mild LVH, o/w normal study CXR - left central venous catheter tip projects over lower SVC. Otherwise stable lines and tubes. Stable hazy opacities of lungs UDS negative, etoh/ salicylate negative, HIV neg   Impression: 1  Acute renal failure - found down, AKI due to ATN from severe rhabdo. Minimal UOP, plan HD #2 today.  Await recovery of renal fxn, supportive care. Take vol down as tolerated.  2  Severe  depression/ schizoaffective disorder - on multiple psych meds 3  Resp failure - off vent now, stable resp 4  AMS - better, Ox 3 5  HTN - stable on norvasc  Plan - as above   Kelly Splinter MD [facility] pager [phone]   04/09/2017, 11:03 AM   Recent Labs  Lab 04/06/17 0306 04/07/17 0247 04/08/17 0436 04/09/17 0601  NA 143 142 141 137  K 4.0 3.4* 3.4* 3.5  CL 110 106 103 97*  CO2 22 22 21* 24  GLUCOSE 123* 155* 125* 123*  BUN 26* 48* 70* 54*  CREATININE 2.94* 5.22* 7.49* 6.71*  CALCIUM 7.5* 7.0* 6.8* 7.0*  PHOS 4.6  --  7.5* 6.8*   Recent Labs  Lab 04/07/17 0247 04/08/17 0436 04/09/17 0601  AST 733* 492* 423*  ALT 311* 240* 233*  ALKPHOS 35* 47 92  BILITOT 0.7 0.7 1.0  PROT 5.2* 7.2 5.5*  ALBUMIN 2.2* 1.9* 2.0*   Recent Labs  Lab 04/06/17 0306 04/07/17 0247 04/09/17 0601  WBC 15.9* 17.7* 16.2*  HGB 14.5 12.8 9.9*  HCT 42.4 38.5 29.0*  MCV 87.1 88.9 86.1  PLT 286 217 182   Iron/TIBC/Ferritin/ %Sat No results found for: IRON, TIBC, FERRITIN, IRONPCTSAT

## 2017-04-09 NOTE — Progress Notes (Signed)
Pharmacy Antibiotic Note  Jenna Hill is a 47 y.o. female with hx bipolar and overdose/suicidal attempts presented to the ED on 04/05/2017 with AMS and was subsequently intubated.  Unasyn started on admission for suspected aspiration PNA. Today is day # 5 of Unasyn. WBC is down to 16.2 and patient is afebrile. Patient is now on hemodialysis with first session yesterday and another session planned for today.   Plan: - Continue  Unasyn at 3 gm every 24 hours while on HD - Monitor renal status and hemodialysis sessions - Monitor length of therapy ________________________________  Height: 5' 6.5" (168.9 cm) Weight: 273 lb 5.9 oz (124 kg) IBW/kg (Calculated) : 60.45  Temp (24hrs), Avg:98.4 F (36.9 C), Min:98 F (36.7 C), Max:99.1 F (37.3 C)  Recent Labs  Lab 04/05/17 1355 04/05/17 1400 04/05/17 1401 04/05/17 1751 04/05/17 2122 04/06/17 0306 04/07/17 0247 04/08/17 0436 04/09/17 0601  WBC 11.8*  --   --   --   --  15.9* 17.7*  --  16.2*  CREATININE 2.01* 2.00*  --   --   --  2.94* 5.22* 7.49* 6.71*  LATICACIDVEN  --   --  10.54* 7.0* 4.2*  --   --   --   --     Estimated Creatinine Clearance: 14.2 mL/min (A) (by C-G formula based on SCr of 6.71 mg/dL (H)).    Allergies  Allergen Reactions  . Bee Venom Anaphylaxis  . Bretylium     Other reaction(s): Seizures  . Codeine Anaphylaxis  . Other Anaphylaxis    bertrillium or bertillium    . Toradol [Ketorolac Tromethamine] Swelling  . Ibuprofen     Raised hives   . Latex Rash    Antimicrobials this admission:  3/5 unasyn>>    Microbiology results:  3/5 BCx x2: NGTD 3/5 MRSA PCR: neg 3/5 resp panel pcr: neg  Thank you for allowing pharmacy to be a part of this patient's care.  Jimmy Footman, PharmD, BCPS PGY2 Infectious Diseases Pharmacy Resident Pager: [phone]  04/09/2017 11:16 AM

## 2017-04-10 ENCOUNTER — Other Ambulatory Visit: Payer: Self-pay

## 2017-04-10 ENCOUNTER — Inpatient Hospital Stay (HOSPITAL_COMMUNITY): Payer: Medicaid Other

## 2017-04-10 DIAGNOSIS — M6282 Rhabdomyolysis: Secondary | ICD-10-CM

## 2017-04-10 DIAGNOSIS — M7989 Other specified soft tissue disorders: Secondary | ICD-10-CM

## 2017-04-10 DIAGNOSIS — F319 Bipolar disorder, unspecified: Secondary | ICD-10-CM

## 2017-04-10 DIAGNOSIS — K7200 Acute and subacute hepatic failure without coma: Secondary | ICD-10-CM

## 2017-04-10 LAB — GLUCOSE, CAPILLARY
GLUCOSE-CAPILLARY: 122 mg/dL — AB (ref 65–99)
GLUCOSE-CAPILLARY: 105 mg/dL — AB (ref 65–99)
GLUCOSE-CAPILLARY: 125 mg/dL — AB (ref 65–99)
GLUCOSE-CAPILLARY: 98 mg/dL (ref 65–99)
Glucose-Capillary: 115 mg/dL — ABNORMAL HIGH (ref 65–99)
Glucose-Capillary: 96 mg/dL (ref 65–99)

## 2017-04-10 LAB — BASIC METABOLIC PANEL
ANION GAP: 12 (ref 5–15)
BUN: 36 mg/dL — ABNORMAL HIGH (ref 6–20)
CO2: 25 mmol/L (ref 22–32)
Calcium: 7.3 mg/dL — ABNORMAL LOW (ref 8.9–10.3)
Chloride: 98 mmol/L — ABNORMAL LOW (ref 101–111)
Creatinine, Ser: 5.45 mg/dL — ABNORMAL HIGH (ref 0.44–1.00)
GFR, EST AFRICAN AMERICAN: 10 mL/min — AB (ref >60)
GFR, EST NON AFRICAN AMERICAN: 9 mL/min — AB (ref >60)
GLUCOSE: 139 mg/dL — AB (ref 65–99)
POTASSIUM: 3.7 mmol/L (ref 3.5–5.1)
SODIUM: 135 mmol/L (ref 135–145)

## 2017-04-10 LAB — CULTURE, BLOOD (ROUTINE X 2)
CULTURE: NO GROWTH
Culture: NO GROWTH
SPECIAL REQUESTS: ADEQUATE
Special Requests: ADEQUATE

## 2017-04-10 LAB — CBC
HCT: 30 % — ABNORMAL LOW (ref 36.0–46.0)
Hemoglobin: 10.2 g/dL — ABNORMAL LOW (ref 12.0–15.0)
MCH: 29.4 pg (ref 26.0–34.0)
MCHC: 34 g/dL (ref 30.0–36.0)
MCV: 86.5 fL (ref 78.0–100.0)
PLATELETS: 191 10*3/uL (ref 150–400)
RBC: 3.47 MIL/uL — AB (ref 3.87–5.11)
RDW: 14 % (ref 11.5–15.5)
WBC: 16.9 10*3/uL — AB (ref 4.0–10.5)

## 2017-04-10 NOTE — Progress Notes (Signed)
[facility] Progress Note  Subjective: up in chair, R flank / hip still very painful  Vitals:   04/10/17 0700 04/10/17 0736 04/10/17 0800 04/10/17 1000  BP: (!) 147/77  120/84   Pulse: (!) 110  (!) 108 (!) 107  Resp: 18  (!) 22 (!) 21  Temp:  98.8 F (37.1 C)    TempSrc:  Axillary    SpO2: 99%  99% 100%  Weight:      Height:        Inpatient medications: . amLODipine  5 mg Oral Daily  . Chlorhexidine Gluconate Cloth  6 each Topical Q0600  . heparin injection (subcutaneous)  5,000 Units Subcutaneous Q8H  . insulin aspart  0-15 Units Subcutaneous Q4H  . ranitidine  150 mg Oral BID  . vortioxetine HBr  20 mg Oral QHS   . ampicillin-sulbactam (UNASYN) IV Stopped (04/10/17 0007)  . dextrose 50 mL/hr at 04/10/17 1027   acetaminophen, fentaNYL (SUBLIMAZE) injection, hydrALAZINE, loperamide, ondansetron (ZOFRAN) IV  Exam: Gen obese young AAF, up in chair, eating, no distress Chest occ rhonchi, mostly clear RRR no MRG Abd soft ntnd no mass or ascites +bs obese GU foley cath draining small amts of clear brownish urine MS no joint effusions or deformity Ext 2-3+ diffuse edema R hip/ lateral thigh area, 1+ edema elsewhere LE's Neuro Ox 3, NF   Home meds: - abilify injection monthly / clozapine bid/ doxepin hs/ seroquel xr bid/ trintellix qd / eszopiclone prn - HCTZ 25 qd/ ecasa    UA - yellow, neg Hb/ LE/ nitrite, 5.0 , prot 30, 1.021, 0-5 wbc/ rbc ECHO - mild LVH, o/w normal study CXR 3/8 > Pulmonary vascular congestion may reflect mild fluid overload. Left lower lobe consolidation unchanged. UDS negative, etoh/ salicylate negative, HIV neg   Impression: 1  Acute renal failure - found down, AKI due to ATN from severe rhabdo. Oliguric, still not making urine. Vol overloaded. SP HD x 2, lytes stable. Plan HD again tomorrow, await return of renal fxn.  Get volume down somewhat. Looks much better overall.  2  Severe depression/ schizoaffective disorder - on  multiple psych meds 3  Resp failure - resolved 4  AMS - resolved 5  HTN - stable on norvasc  Plan - as above   Kelly Splinter MD [facility] pager [phone]   04/10/2017, 11:26 AM   Recent Labs  Lab 04/06/17 0306  04/08/17 0436 04/09/17 0601 04/10/17 0423  NA 143   < > 141 137 135  K 4.0   < > 3.4* 3.5 3.7  CL 110   < > 103 97* 98*  CO2 22   < > 21* 24 25  GLUCOSE 123*   < > 125* 123* 139*  BUN 26*   < > 70* 54* 36*  CREATININE 2.94*   < > 7.49* 6.71* 5.45*  CALCIUM 7.5*   < > 6.8* 7.0* 7.3*  PHOS 4.6  --  7.5* 6.8*  --    < > = values in this interval not displayed.   Recent Labs  Lab 04/07/17 0247 04/08/17 0436 04/09/17 0601  AST 733* 492* 423*  ALT 311* 240* 233*  ALKPHOS 35* 47 92  BILITOT 0.7 0.7 1.0  PROT 5.2* 7.2 5.5*  ALBUMIN 2.2* 1.9* 2.0*   Recent Labs  Lab 04/07/17 0247 04/09/17 0601 04/10/17 0423  WBC 17.7* 16.2* 16.9*  HGB 12.8 9.9* 10.2*  HCT 38.5 29.0* 30.0*  MCV 88.9 86.1 86.5  PLT 217 182 191   Iron/TIBC/Ferritin/ %Sat No results found for: IRON, TIBC, FERRITIN, IRONPCTSAT

## 2017-04-10 NOTE — Progress Notes (Signed)
Pt had episode of vomiting prior to lunch. States it happened all of a sudden, but does not feel nauseous anymore. Dr. Wynelle Cleveland notified. Recommended pt drink less juice and eat more food. (pt has been drinking juice all day and eating very little food)

## 2017-04-10 NOTE — Progress Notes (Signed)
VASCULAR LAB PRELIMINARY  PRELIMINARY  PRELIMINARY  PRELIMINARY  Right lower extremity venous duplex completed.    Preliminary report:  There is no obvious evidence of DVT or SVT noted in the right lower extremity.   Dalan Cowger, RVT 04/10/2017, 2:49 PM

## 2017-04-10 NOTE — Progress Notes (Addendum)
Patient did not eat but 25% of dinner, drinking approx 480 ml cranberry juice and water.  No N/V. Output per foley 64ml amber/brown with sediments.

## 2017-04-10 NOTE — Progress Notes (Addendum)
PROGRESS NOTE    Jenna Hill   CHE:[id number]  DOB: 03-Nov-1970  DOA: 04/05/2017 PCP: Alfonse Spruce, FNP   Brief Narrative:  Jenna Hill is a 47 y/o F with h/o bipolar disorder, schizophrenia and HTN who presented the the hospital from home on 3/5 obtunded with hypothermia & tachycardia. Narcan did not help. She was intubated for airway protection. Vomited during intubation. Lactic acid 10.54,  K 2.7, BUN 21, Cr 2.01 CK > 50,000, Lipase 207, ASH 349, ALD 184 CXR >  Vascular congestion Unasyn started on admission for possible aspiration  Received aggressive IV hydration but renal function continued to worsen and nephrology was consulted.   Extubated on 3/8. Dialysis also started same day.   Subjective: She is awake and alert, sitting up in a chair today. She gives me a little more history. She states that she was started on Abilify 15 mg and took the first dose the night before coming to the ER. Her roommate later found her unresponsive. She had not been sick with any other illness and no other changes in medications were made. She complains of ongoing pain in her right hip and thigh. She is not able to stand up and bear weight on that leg. ROS: no complaints of nausea, vomiting, constipation diarrhea, cough, dyspnea or dysuria. No other complaints.   Assessment & Plan:   Active Problems:    Acute encephalopathy   Bipolar 1 disorder     Schizoaffective disorder, depressive type  - suspected medication overdose? - she denied suicidal attempt- she states she only took a dose of Abilify the night before and had no other changes - CT head negative, APAP and Salicylates negative - UDS negative - Trintellix resumed - Clozapine, doxepin, Lunesta, Seroquel are on hold    Acute respiratory failure - intubated on admission and extubated on 3/8 - resp virus panel negative - resp culture> no growth - LLL consolidation on CXR - given Unasyn for possibly aspiration as she  vomited during intubation  Elevated LFTs - Shock liver vs due to rhabdo - cont to follow LFTs- noted to be improving    Rhabdomyolysis - suspect swelling in right leg is related to this. She may have possibly been laying on the leg for a prolonged period of time - CK has been improving- cont to follow - check venous duplex of leg  Lactic acidosis/ hypothermia - improved  AKI secondary to rhabdomyolysis - oliguric - cr increased to 7.49 and now improving - renal ultrasound 3/7- no hydronephrosis - dialyzed 3/8 and 3/9  Leukocytosis  - WBC normal on admission- has slowly risen to 16- no signs of infection    Hypertension - HCTZ on hold- cont Norvasc  DVT prophylaxis:  Heparin Code Status: Full code Family Communication:  Disposition Plan:  Cont to follow SDU Consultants:   PCCM  Nephrology  Procedures:   Intubation-Extubation Antimicrobials:  Anti-infectives (From admission, onward)   Start     Dose/Rate Route Frequency Ordered Stop   04/09/17 2000  Ampicillin-Sulbactam (UNASYN) 3 g in sodium chloride 0.9 % 100 mL IVPB     3 g 200 mL/hr over 30 Minutes Intravenous Every 24 hours 04/09/17 1119 04/11/17 2359   04/08/17 0600  Ampicillin-Sulbactam (UNASYN) 3 g in sodium chloride 0.9 % 100 mL IVPB  Status:  Discontinued     3 g 200 mL/hr over 30 Minutes Intravenous Every 24 hours 04/07/17 0719 04/09/17 1119   04/06/17 1800  Ampicillin-Sulbactam (UNASYN) 3 g in  sodium chloride 0.9 % 100 mL IVPB  Status:  Discontinued     3 g 200 mL/hr over 30 Minutes Intravenous Every 12 hours 04/06/17 0942 04/07/17 0719   04/05/17 1800  Ampicillin-Sulbactam (UNASYN) 3 g in sodium chloride 0.9 % 100 mL IVPB  Status:  Discontinued     3 g 200 mL/hr over 30 Minutes Intravenous Every 6 hours 04/05/17 1620 04/06/17 0942   04/05/17 1630  Ampicillin-Sulbactam (UNASYN) 3 g in sodium chloride 0.9 % 100 mL IVPB     3 g 200 mL/hr over 30 Minutes Intravenous  Once 04/05/17 1608 04/05/17 1844        Objective: Vitals:   04/10/17 0700 04/10/17 0736 04/10/17 0800 04/10/17 1000  BP: (!) 147/77  120/84   Pulse: (!) 110  (!) 108 (!) 107  Resp: 18  (!) 22 (!) 21  Temp:  98.8 F (37.1 C)    TempSrc:  Axillary    SpO2: 99%  99% 100%  Weight:      Height:        Intake/Output Summary (Last 24 hours) at 04/10/2017 1102 Last data filed at 04/10/2017 1004 Gross per 24 hour  Intake 1710 ml  Output 2571 ml  Net -861 ml   Filed Weights   04/09/17 0500 04/09/17 1620 04/10/17 0600  Weight: 124.1 kg (273 lb 9.5 oz) 124.1 kg (273 lb 9.5 oz) 122.9 kg (270 lb 15.1 oz)    Examination: General exam: Appears comfortable  HEENT: PERRLA, oral mucosa moist, no sclera icterus or thrush Respiratory system: Clear to auscultation. Respiratory effort normal. Cardiovascular system: S1 & S2 heard, RRR.  No murmurs  Gastrointestinal system: Abdomen soft, non-tender, nondistended. Normal bowel sound. No organomegaly Central nervous system: Alert and oriented. No focal neurological deficits. Extremities: No cyanosis, clubbing - significant edema in right leg from foot to hip with pitting in the hip and thigh area- leg is tender to palpation Skin: No rashes or ulcers Psychiatry:  Mood & affect appropriate.   Data Reviewed: I have personally reviewed following labs and imaging studies  CBC: Recent Labs  Lab 04/05/17 1355 04/05/17 1400 04/06/17 0306 04/07/17 0247 04/09/17 0601 04/10/17 0423  WBC 11.8*  --  15.9* 17.7* 16.2* 16.9*  HGB 15.6* 16.0* 14.5 12.8 9.9* 10.2*  HCT 44.9 47.0* 42.4 38.5 29.0* 30.0*  MCV 86.5  --  87.1 88.9 86.1 86.5  PLT 268  --  286 217 182 824   Basic Metabolic Panel: Recent Labs  Lab 04/05/17 1355  04/06/17 0306 04/07/17 0247 04/08/17 0436 04/09/17 0601 04/10/17 0423  NA 138   < > 143 142 141 137 135  K 2.7*   < > 4.0 3.4* 3.4* 3.5 3.7  CL 101   < > 110 106 103 97* 98*  CO2 15*  --  22 22 21* 24 25  GLUCOSE 322*   < > 123* 155* 125* 123* 139*  BUN 21*    < > 26* 48* 70* 54* 36*  CREATININE 2.01*   < > 2.94* 5.22* 7.49* 6.71* 5.45*  CALCIUM 8.6*  --  7.5* 7.0* 6.8* 7.0* 7.3*  MG 2.2  --  1.6*  --  2.2 2.0  --   PHOS  --   --  4.6  --  7.5* 6.8*  --    < > = values in this interval not displayed.   GFR: Estimated Creatinine Clearance: 17.4 mL/min (A) (by C-G formula based on SCr of 5.45 mg/dL (H)). Liver  Function Tests: Recent Labs  Lab 04/05/17 1355 04/06/17 0306 04/07/17 0247 04/08/17 0436 04/09/17 0601  AST 349* 805* 733* 492* 423*  ALT 184* 331* 311* 240* 233*  ALKPHOS 41 34* 35* 47 92  BILITOT 0.6 0.6 0.7 0.7 1.0  PROT 7.6 5.5* 5.2* 7.2 5.5*  ALBUMIN 3.8 2.6* 2.2* 1.9* 2.0*   Recent Labs  Lab 04/05/17 1355  LIPASE 207*   No results for input(s): AMMONIA in the last 168 hours. Coagulation Profile: No results for input(s): INR, PROTIME in the last 168 hours. Cardiac Enzymes: Recent Labs  Lab 04/05/17 1600 04/06/17 0306 04/07/17 0247 04/09/17 0601  CKTOTAL >50,000* >50,000* >50,000* 37,372*   BNP (last 3 results) No results for input(s): PROBNP in the last 8760 hours. HbA1C: No results for input(s): HGBA1C in the last 72 hours. CBG: Recent Labs  Lab 04/09/17 1145 04/09/17 2120 04/09/17 2319 04/10/17 0350 04/10/17 0734  GLUCAP 136* 75 95 115* 122*   Lipid Profile: Recent Labs    04/09/17 0601  TRIG 188*   Thyroid Function Tests: No results for input(s): TSH, T4TOTAL, FREET4, T3FREE, THYROIDAB in the last 72 hours. Anemia Panel: No results for input(s): VITAMINB12, FOLATE, FERRITIN, TIBC, IRON, RETICCTPCT in the last 72 hours. Urine analysis:    Component Value Date/Time   COLORURINE YELLOW 04/05/2017 1427   APPEARANCEUR HAZY (A) 04/05/2017 1427   LABSPEC 1.021 04/05/2017 1427   PHURINE 5.0 04/05/2017 1427   GLUCOSEU 150 (A) 04/05/2017 1427   HGBUR NEGATIVE 04/05/2017 1427   BILIRUBINUR NEGATIVE 04/05/2017 1427   KETONESUR 5 (A) 04/05/2017 1427   PROTEINUR 30 (A) 04/05/2017 1427    UROBILINOGEN 0.2 08/02/2008 0812   NITRITE NEGATIVE 04/05/2017 1427   LEUKOCYTESUR NEGATIVE 04/05/2017 1427   Sepsis Labs: @LABRCNTIP (procalcitonin:4,lacticidven:4) ) Recent Results (from the past 240 hour(s))  Respiratory Panel by PCR     Status: None   Collection Time: 04/05/17  4:01 PM  Result Value Ref Range Status   Adenovirus NOT DETECTED NOT DETECTED Final   Coronavirus 229E NOT DETECTED NOT DETECTED Final   Coronavirus HKU1 NOT DETECTED NOT DETECTED Final   Coronavirus NL63 NOT DETECTED NOT DETECTED Final   Coronavirus OC43 NOT DETECTED NOT DETECTED Final   Metapneumovirus NOT DETECTED NOT DETECTED Final   Rhinovirus / Enterovirus NOT DETECTED NOT DETECTED Final   Influenza A NOT DETECTED NOT DETECTED Final   Influenza B NOT DETECTED NOT DETECTED Final   Parainfluenza Virus 1 NOT DETECTED NOT DETECTED Final   Parainfluenza Virus 2 NOT DETECTED NOT DETECTED Final   Parainfluenza Virus 3 NOT DETECTED NOT DETECTED Final   Parainfluenza Virus 4 NOT DETECTED NOT DETECTED Final   Respiratory Syncytial Virus NOT DETECTED NOT DETECTED Final   Bordetella pertussis NOT DETECTED NOT DETECTED Final   Chlamydophila pneumoniae NOT DETECTED NOT DETECTED Final   Mycoplasma pneumoniae NOT DETECTED NOT DETECTED Final    Comment: Performed at [facility], [place] [address] [place], [place] [address]  Culture, blood (routine x 2)     Status: None (Preliminary result)   Collection Time: 04/05/17  5:51 PM  Result Value Ref Range Status   Specimen Description   Final    BLOOD RIGHT HAND Performed at [facility] [address], [place], [place] [address]    Special Requests   Final    IN PEDIATRIC BOTTLE Blood Culture adequate volume Performed at [facility] [address] [place], [place] [address]    Culture  Final    NO GROWTH 4 DAYS Performed at [facility], [place] [address], [place], [place] [address]    Report Status PENDING   Incomplete  Culture, blood (routine x 2)     Status: None (Preliminary result)   Collection Time: 04/05/17  5:51 PM  Result Value Ref Range Status   Specimen Description   Final    BLOOD RIGHT HAND Performed at [facility] [address], [place], [place] [address]    Special Requests   Final    IN PEDIATRIC BOTTLE Blood Culture adequate volume Performed at [facility] [address] [place], [place] [address]    Culture   Final    NO GROWTH 4 DAYS Performed at [facility], [place] [address] [place], [place] [address]    Report Status PENDING  Incomplete  MRSA PCR Screening     Status: None   Collection Time: 04/05/17  6:13 PM  Result Value Ref Range Status   MRSA by PCR NEGATIVE NEGATIVE Final    Comment:        The GeneXpert MRSA Assay (FDA approved for NASAL specimens only), is one component of a comprehensive MRSA colonization surveillance program. It is not intended to diagnose MRSA infection nor to guide or monitor treatment for MRSA infections. Performed at [facility], [place] [address], [place], [place] [address]          Radiology Studies: Ct Hip Right Wo Contrast  Result Date: 04/09/2017 CLINICAL DATA:  Right hip and thigh swelling after fall. EXAM: CT OF THE RIGHT HIP AND FEMUR WITHOUT CONTRAST TECHNIQUE: Multidetector CT imaging of the right hip was performed according to the standard protocol. Multiplanar CT image reconstructions were also generated. COMPARISON:  None. FINDINGS: Bones/Joint/Cartilage No acute fracture or dislocation. Joint spaces are preserved. Trace suprapatellar knee joint effusion. Bone mineralization is normal. Ligaments Suboptimally assessed by CT. Muscles and Tendons Grossly unremarkable.  No muscle atrophy. Soft tissues Moderate subcutaneous edema and fat stranding about the right lateral hip and circumferentially through the thigh. Small amount of fascial fluid along the  right vastus lateralis muscle. No drainable fluid collection. No hematoma. IMPRESSION: 1. Moderate subcutaneous edema and fat stranding about the right lateral hip and circumferentially through the thigh, nonspecific, but can be seen with cellulitis. No drainable fluid collection. 2.  No acute osseous abnormality. Electronically Signed   By: Titus Dubin M.D.   On: 04/09/2017 15:04   Ct Femur Right Wo Contrast  Result Date: 04/09/2017 CLINICAL DATA:  Right hip and thigh swelling after fall. EXAM: CT OF THE RIGHT HIP AND FEMUR WITHOUT CONTRAST TECHNIQUE: Multidetector CT imaging of the right hip was performed according to the standard protocol. Multiplanar CT image reconstructions were also generated. COMPARISON:  None. FINDINGS: Bones/Joint/Cartilage No acute fracture or dislocation. Joint spaces are preserved. Trace suprapatellar knee joint effusion. Bone mineralization is normal. Ligaments Suboptimally assessed by CT. Muscles and Tendons Grossly unremarkable.  No muscle atrophy. Soft tissues Moderate subcutaneous edema and fat stranding about the right lateral hip and circumferentially through the thigh. Small amount of fascial fluid along the right vastus lateralis muscle. No drainable fluid collection. No hematoma. IMPRESSION: 1. Moderate subcutaneous edema and fat stranding about the right lateral hip and circumferentially through the thigh, nonspecific, but can be seen with cellulitis. No drainable fluid collection. 2.  No acute osseous abnormality. Electronically Signed   By: Titus Dubin M.D.   On: 04/09/2017  15:04      Scheduled Meds: . amLODipine  5 mg Oral Daily  . Chlorhexidine Gluconate Cloth  6 each Topical Q0600  . heparin injection (subcutaneous)  5,000 Units Subcutaneous Q8H  . insulin aspart  0-15 Units Subcutaneous Q4H  . ranitidine  150 mg Oral BID  . vortioxetine HBr  20 mg Oral QHS   Continuous Infusions: . ampicillin-sulbactam (UNASYN) IV Stopped (04/10/17 0007)  .  dextrose 50 mL/hr at 04/10/17 1027     LOS: 5 days    Time spent in minutes: [place], MD [facility] Pager: [email or web address] Password [email or web address] 04/10/2017, 11:02 AM

## 2017-04-11 ENCOUNTER — Encounter (HOSPITAL_COMMUNITY): Payer: Self-pay

## 2017-04-11 DIAGNOSIS — T796XXD Traumatic ischemia of muscle, subsequent encounter: Secondary | ICD-10-CM

## 2017-04-11 LAB — BASIC METABOLIC PANEL
ANION GAP: 13 (ref 5–15)
BUN: 55 mg/dL — ABNORMAL HIGH (ref 6–20)
CALCIUM: 7.6 mg/dL — AB (ref 8.9–10.3)
CO2: 23 mmol/L (ref 22–32)
Chloride: 92 mmol/L — ABNORMAL LOW (ref 101–111)
Creatinine, Ser: 7.71 mg/dL — ABNORMAL HIGH (ref 0.44–1.00)
GFR, EST AFRICAN AMERICAN: 7 mL/min — AB (ref >60)
GFR, EST NON AFRICAN AMERICAN: 6 mL/min — AB (ref >60)
Glucose, Bld: 114 mg/dL — ABNORMAL HIGH (ref 65–99)
POTASSIUM: 3.7 mmol/L (ref 3.5–5.1)
SODIUM: 128 mmol/L — AB (ref 135–145)

## 2017-04-11 LAB — CBC
HCT: 28.3 % — ABNORMAL LOW (ref 36.0–46.0)
HEMOGLOBIN: 9.8 g/dL — AB (ref 12.0–15.0)
MCH: 29.6 pg (ref 26.0–34.0)
MCHC: 34.6 g/dL (ref 30.0–36.0)
MCV: 85.5 fL (ref 78.0–100.0)
Platelets: 181 10*3/uL (ref 150–400)
RBC: 3.31 MIL/uL — AB (ref 3.87–5.11)
RDW: 13.5 % (ref 11.5–15.5)
WBC: 18 10*3/uL — ABNORMAL HIGH (ref 4.0–10.5)

## 2017-04-11 LAB — URINALYSIS, ROUTINE W REFLEX MICROSCOPIC
Glucose, UA: NEGATIVE mg/dL
KETONES UR: NEGATIVE mg/dL
Nitrite: POSITIVE — AB
PROTEIN: 100 mg/dL — AB
Specific Gravity, Urine: 1.02 (ref 1.005–1.030)
pH: 7 (ref 5.0–8.0)

## 2017-04-11 LAB — URINALYSIS, MICROSCOPIC (REFLEX)

## 2017-04-11 LAB — GLUCOSE, CAPILLARY
GLUCOSE-CAPILLARY: 91 mg/dL (ref 65–99)
GLUCOSE-CAPILLARY: 90 mg/dL (ref 65–99)
Glucose-Capillary: 92 mg/dL (ref 65–99)

## 2017-04-11 MED ORDER — CALCIUM ACETATE (PHOS BINDER) 667 MG PO CAPS
667.0000 mg | ORAL_CAPSULE | Freq: Three times a day (TID) | ORAL | Status: DC
  Administered 2017-04-11 – 2017-04-24 (×37): 667 mg via ORAL
  Filled 2017-04-11 (×38): qty 1

## 2017-04-11 MED ORDER — LIDOCAINE-PRILOCAINE 2.5-2.5 % EX CREA: 1.0000 "application " | TOPICAL_CREAM | CUTANEOUS | Status: DC | PRN

## 2017-04-11 MED ORDER — FENTANYL CITRATE (PF) 100 MCG/2ML IJ SOLN
INTRAMUSCULAR | Status: AC
  Filled 2017-04-11: qty 2

## 2017-04-11 MED ORDER — SODIUM CHLORIDE 0.9 % IV SOLN: 100.0000 mL | INTRAVENOUS | Status: DC | PRN

## 2017-04-11 MED ORDER — HEPARIN SODIUM (PORCINE) 1000 UNIT/ML DIALYSIS: 3500.0000 [IU] | INTRAMUSCULAR | Status: DC | PRN

## 2017-04-11 MED ORDER — HEPARIN SODIUM (PORCINE) 1000 UNIT/ML DIALYSIS: 2500.0000 [IU] | INTRAMUSCULAR | Status: DC | PRN

## 2017-04-11 MED ORDER — DICLOFENAC SODIUM 1 % TD GEL
4.0000 g | Freq: Four times a day (QID) | TRANSDERMAL | Status: DC
  Administered 2017-04-11 – 2017-04-29 (×38): 4 g via TOPICAL
  Filled 2017-04-11 (×2): qty 100

## 2017-04-11 MED ORDER — FENTANYL CITRATE (PF) 100 MCG/2ML IJ SOLN
75.0000 ug | INTRAMUSCULAR | Status: DC | PRN
Start: 2017-04-11 — End: 2017-04-12
  Administered 2017-04-12 (×5): 75 ug via INTRAVENOUS
  Filled 2017-04-11 (×5): qty 2

## 2017-04-11 MED ORDER — PENTAFLUOROPROP-TETRAFLUOROETH EX AERO: 1.0000 "application " | INHALATION_SPRAY | CUTANEOUS | Status: DC | PRN

## 2017-04-11 MED ORDER — DIPHENHYDRAMINE HCL 25 MG PO CAPS
ORAL_CAPSULE | ORAL | Status: AC
  Filled 2017-04-11: qty 1

## 2017-04-11 MED ORDER — DIPHENHYDRAMINE HCL 25 MG PO CAPS
25.0000 mg | ORAL_CAPSULE | Freq: Once | ORAL | Status: AC
  Administered 2017-04-11: 25 mg via ORAL

## 2017-04-11 NOTE — Progress Notes (Signed)
Physical Therapy Treatment Patient Details Name: Jenna Hill MRN: [id number] DOB: September 13, 1970 Today's Date: 04/11/2017    History of Present Illness pt is a 47 y/o female with pmh significant for schizophrenia, HTN, bipolar d/o and attempted suicide admitted with AMS and hypothermia, suspected intentional OD of psych medication.  Treating for aspiration and sepsis.  Extubated 3/8.    PT Comments    Continuing work on functional mobility and activity tolerance;  Able to progress amb and activity tolerance modestly today, limited by nausea and vomiting; continued decr R ankle movement; will watch closely for return and possibly consider AFO for dorsiflexion assist next session   Follow Up Recommendations  CIR     Equipment Recommendations  Other (comment)(TBA)    Recommendations for Other Services Rehab consult     Precautions / Restrictions Precautions Precautions: Fall Precaution Comments: No pf/df at R Foot. Restrictions Weight Bearing Restrictions: No    Mobility  Bed Mobility Overal bed mobility: Needs Assistance Bed Mobility: Supine to Sit     Supine to sit: Mod assist     General bed mobility comments: cues for sequence of task, Mod assist to help RLE off of the bed; minimal handheld assist LE and trunk  Transfers Overall transfer level: Needs assistance Equipment used: Rolling walker (2 wheeled) Transfers: Sit to/from Stand Sit to Stand: Mod assist         General transfer comment: Mod assist to power up and steady at initial stand  Ambulation/Gait Ambulation/Gait assistance: Mod assist;+2 safety/equipment Ambulation Distance (Feet): 6 Feet Assistive device: Rolling walker (2 wheeled) Gait Pattern/deviations: Steppage(R)     General Gait Details: pt with no active pf/df on the R LE with abscence of light sensation.  Movement of R LE is uncoordinated with steppage movement, need for visual assist to place R foot and stability assist  throughout.   Stairs            Wheelchair Mobility    Modified Rankin (Stroke Patients Only)       Balance     Sitting balance-Leahy Scale: Fair       Standing balance-Leahy Scale: Poor                              Cognition Arousal/Alertness: Awake/alert Behavior During Therapy: WFL for tasks assessed/performed Overall Cognitive Status: Within Functional Limits for tasks assessed(not formally assessed)                                        Exercises      General Comments General comments (skin integrity, edema, etc.): Session conducted on Room Air; O2 sats stable, greater than or equal to 90%; HR tachy throughout, 140 bpm observed highest; Nauseated and began vomiting after sitting down; RN notified      Pertinent Vitals/Pain Pain Assessment: Faces Faces Pain Scale: Hurts little more Pain Location: R thigh and lower leg Pain Descriptors / Indicators: Aching;Grimacing Pain Intervention(s): Monitored during session    Home Living   Living Arrangements: Other relatives                  Prior Function            PT Goals (current goals can now be found in the care plan section) Acute Rehab PT Goals Patient Stated Goal: pt didn't state any specific goal,  but wants to be able to live alone. PT Goal Formulation: With patient Time For Goal Achievement: 04/22/17 Potential to Achieve Goals: Good Progress towards PT goals: Progressing toward goals    Frequency    Min 3X/week      PT Plan Current plan remains appropriate    Co-evaluation              AM-PAC PT "6 Clicks" Daily Activity  Outcome Measure  Difficulty turning over in bed (including adjusting bedclothes, sheets and blankets)?: A Lot Difficulty moving from lying on back to sitting on the side of the bed? : Unable Difficulty sitting down on and standing up from a chair with arms (e.g., wheelchair, bedside commode, etc,.)?: Unable Help needed  moving to and from a bed to chair (including a wheelchair)?: A Lot Help needed walking in hospital room?: A Lot Help needed climbing 3-5 steps with a railing? : A Lot 6 Click Score: 10    End of Session   Activity Tolerance: Other (comment)(limited by nausea and vomiting) Patient left: in chair;with call bell/phone within reach;with chair alarm set Nurse Communication: Mobility status;Other (comment)(and vomiting) PT Visit Diagnosis: Unsteadiness on feet (R26.81);Other abnormalities of gait and mobility (R26.89);Muscle weakness (generalized) (M62.81);Other symptoms and signs involving the nervous system (R29.898)     Time: 0109-3235 PT Time Calculation (min) (ACUTE ONLY): 29 min  Charges:  $Gait Training: 8-22 mins $Therapeutic Activity: 8-22 mins                    G Codes:       Roney Marion, PT  Acute Rehabilitation Services Pager [phone] Office [place] 04/11/2017, 10:58 AM

## 2017-04-11 NOTE — Progress Notes (Signed)
[facility] Progress Note  Subjective: seen on the way and then in HD- c/o pain all over and nausea  Vitals:   04/11/17 0900 04/11/17 1000 04/11/17 1100 04/11/17 1200  BP:  136/79  (!) 147/92  Pulse: (!) 116 (!) 119 (!) 121 (!) 109  Resp: 19 19 (!) 21 (!) 26  Temp:    98.3 F (36.8 C)  TempSrc:    Oral  SpO2: 97% 96% 98% 94%  Weight:      Height:        Inpatient medications: . amLODipine  5 mg Oral Daily  . Chlorhexidine Gluconate Cloth  6 each Topical Q0600  . diclofenac sodium  4 g Topical QID  . heparin injection (subcutaneous)  5,000 Units Subcutaneous Q8H  . ranitidine  150 mg Oral BID  . vortioxetine HBr  20 mg Oral QHS   . dextrose 50 mL (04/11/17 0025)   acetaminophen, fentaNYL (SUBLIMAZE) injection, hydrALAZINE, loperamide, ondansetron (ZOFRAN) IV  Exam: Gen obese young AAF, up in chair, eating, no distress Chest occ rhonchi, mostly clear RRR no MRG Abd soft ntnd no mass or ascites +bs obese GU foley cath draining small amts of clear brownish urine MS no joint effusions or deformity Ext 2-3+ diffuse edema R hip/ lateral thigh area, 1+ edema elsewhere LE's Neuro Ox 3, NF   Home meds: - abilify injection monthly / clozapine bid/ doxepin hs/ seroquel xr bid/ trintellix qd / eszopiclone prn - HCTZ 25 qd/ ecasa    UA - yellow, neg Hb/ LE/ nitrite, 5.0 , prot 30, 1.021, 0-5 wbc/ rbc ECHO - mild LVH, o/w normal study CXR 3/8 > Pulmonary vascular congestion may reflect mild fluid overload. Left lower lobe consolidation unchanged. UDS negative, etoh/ salicylate negative, HIV neg   Impression: 1  Acute renal failure - found down, AKI due to ATN from severe rhabdo-  still not making urine. Vol overloaded. SP HD x 2- 3/8 and 3/9, lytes stable. Plan HD again today, await return of renal fxn- baseline renal function in Feb 2018 was normal  2  Severe depression/ schizoaffective disorder - on multiple psych meds 3  Resp failure - resolved- still  overloaded- UF as able  4  AMS - resolved 5  HTN - stable on norvasc 6. Anemia- check iron stores 7. Bones- check intact PTH- last phos 6.8, start phoslo      Barbara Ahart A  04/11/2017, 12:50 PM   Recent Labs  Lab 04/06/17 0306  04/08/17 0436 04/09/17 0601 04/10/17 0423 04/11/17 0739  NA 143   < > 141 137 135 128*  K 4.0   < > 3.4* 3.5 3.7 3.7  CL 110   < > 103 97* 98* 92*  CO2 22   < > 21* 24 25 23   GLUCOSE 123*   < > 125* 123* 139* 114*  BUN 26*   < > 70* 54* 36* 55*  CREATININE 2.94*   < > 7.49* 6.71* 5.45* 7.71*  CALCIUM 7.5*   < > 6.8* 7.0* 7.3* 7.6*  PHOS 4.6  --  7.5* 6.8*  --   --    < > = values in this interval not displayed.   Recent Labs  Lab 04/07/17 0247 04/08/17 0436 04/09/17 0601  AST 733* 492* 423*  ALT 311* 240* 233*  ALKPHOS 35* 47 92  BILITOT 0.7 0.7 1.0  PROT 5.2* 7.2 5.5*  ALBUMIN 2.2* 1.9* 2.0*   Recent Labs  Lab 04/09/17 0601 04/10/17 0423  04/11/17 0739  WBC 16.2* 16.9* 18.0*  HGB 9.9* 10.2* 9.8*  HCT 29.0* 30.0* 28.3*  MCV 86.1 86.5 85.5  PLT 182 191 181   Iron/TIBC/Ferritin/ %Sat No results found for: IRON, TIBC, FERRITIN, IRONPCTSAT

## 2017-04-11 NOTE — Progress Notes (Signed)
Physical Therapy Evaluation Note (late entry for evaluation performed on 04/08/17 by Donnella Sham, PT  Clinical Impression: Pt admitted with above diagnosis. Pt currently with functional limitations due to the deficits listed below (see PT Problem List). Presents with decr functional mobility and decr activity tolerance; Gait deviations with instability and incr fall risk, as well as pain, likely related to rhabdomyolysis; Still, [name] is young and motivated, and we are recommending CIR for post-acute rehab to maximize independence and safety with mobility;  Pt will benefit from skilled PT to increase their independence and safety with mobility to allow discharge to the venue listed below.          04/08/17 1200  PT Visit Information  Last PT Received On 04/08/17  Assistance Needed +2 (gait)  History of Present Illness pt is a 47 y/o female with pmh significant for schizophrenia, HTN, bipolar d/o and attempted suicide admitted with AMS and hypothermia, suspected intentional OD of psych medication.  Treating for aspiration and sepsis.  Extubated 3/8.  Precautions  Precautions Fall  Precaution Comments No pf/df at R Foot.  Home Living  Family/patient expects to be discharged to: Private residence  Living Arrangements Alone  Available Help at Discharge Friend(s);Available PRN/intermittently (as needed if called)  Type of Home Apartment  Home Access Stairs to enter  Entrance Stairs-Number of Steps 3  Entrance Stairs-Rails None  Home Layout One level  Bathroom Shower/Tub Tub/shower unit  Automotive engineer None  Prior Function  Level of Independence Independent  Comments drove and ran her own errands, but didn't work due to her schizophrenia.  Communication  Communication No difficulties (a little soft spoken)  Pain Assessment  Pain Assessment Faces  Faces Pain Scale 0  Pain Intervention(s) Monitored during session  Cognition  Arousal/Alertness  Awake/alert  Behavior During Therapy WFL for tasks assessed/performed  Overall Cognitive Status Within Functional Limits for tasks assessed (not formally assessed)  Upper Extremity Assessment  Upper Extremity Assessment Defer to OT evaluation  Lower Extremity Assessment  Lower Extremity Assessment RLE deficits/detail;LLE deficits/detail  RLE Deficits / Details grossly 4- to 4/5 except pf/df 0 to trace/5  RLE Coordination decreased fine motor  LLE Deficits / Details WFL grossly 4/5  Bed Mobility  Overal bed mobility Needs Assistance  Bed Mobility Rolling;Sidelying to Sit  Rolling Min assist  Sidelying to sit Min assist  General bed mobility comments cues for sequence of task, minimal assist LE and trunk  Transfers  Overall transfer level Needs assistance  Equipment used Rolling walker (2 wheeled)  Transfers Sit to/from Stand;Stand Pivot Transfers  Sit to Stand Mod assist  Stand pivot transfers Mod assist  General transfer comment pt with no active pf/df on the R LE with abscence of light sensation.  Movement of R LE is uncoordinated with steppage movement, need for visual assist to place R foot and stability assist throughout.  Balance  Overall balance assessment Needs assistance  Sitting-balance support No upper extremity supported;Feet supported  Sitting balance-Leahy Scale Fair  Sitting balance - Comments able to move out of midline/BOS, but guarded  Standing balance support Bilateral upper extremity supported  Standing balance-Leahy Scale Poor  Standing balance comment reliant on the RW due to R foot instability  General Comments  General comments (skin integrity, edema, etc.) HR tachy in the 120's to 130's, sats on 4L stable overall in the mid 90's or better.  PT - End of Session  Activity Tolerance Patient tolerated treatment well  Patient left in chair;with call bell/phone within reach;with chair alarm set  Nurse Communication Mobility status  PT Assessment  PT  Recommendation/Assessment Patient needs continued PT services  PT Visit Diagnosis Unsteadiness on feet (R26.81);Other abnormalities of gait and mobility (R26.89);Muscle weakness (generalized) (M62.81);Other symptoms and signs involving the nervous system (R29.898)  PT Problem List Decreased strength;Decreased activity tolerance;Decreased balance;Decreased mobility;Decreased coordination;Decreased knowledge of use of DME;Impaired sensation  Barriers to Discharge Decreased caregiver support  PT Plan  PT Frequency (ACUTE ONLY) Min 3X/week  PT Treatment/Interventions (ACUTE ONLY) DME instruction;Gait training;Functional mobility training;Therapeutic activities;Balance training;Patient/family education  AM-PAC PT "6 Clicks" Daily Activity Outcome Measure  Difficulty turning over in bed (including adjusting bedclothes, sheets and blankets)? 1  Difficulty moving from lying on back to sitting on the side of the bed?  1  Difficulty sitting down on and standing up from a chair with arms (e.g., wheelchair, bedside commode, etc,.)? 1  Help needed moving to and from a bed to chair (including a wheelchair)? 2  Help needed walking in hospital room? 2  Help needed climbing 3-5 steps with a railing?  2  6 Click Score 9  Mobility G Code  CL  PT Recommendation  Recommendations for Other Services Rehab consult  Follow Up Recommendations CIR  PT equipment Other (comment) (TBA)  Individuals Consulted  Consulted and Agree with Results and Recommendations Patient  Acute Rehab PT Goals  Patient Stated Goal pt didn't state any specific goal, but wants to be able to live alone.  PT Goal Formulation With patient  Time For Goal Achievement 04/22/17  Potential to Achieve Goals Good  PT Time Calculation  PT Start Time (ACUTE ONLY) 1108  PT Stop Time (ACUTE ONLY) 1150  PT Time Calculation (min) (ACUTE ONLY) 42 min  PT General Charges  $$ ACUTE PT VISIT 1 Visit  PT Evaluation  $PT Eval Moderate Complexity 1 Mod   PT Treatments  $Therapeutic Activity 23-37 mins    Roney Marion, PT  Acute Rehabilitation Services Pager [phone] Office [phone]  (for Donnella Sham, PT)

## 2017-04-11 NOTE — Progress Notes (Signed)
Nutrition Follow-up  DOCUMENTATION CODES:   Morbid obesity  INTERVENTION:   Snacks TID  NUTRITION DIAGNOSIS:   Inadequate oral intake related to decreased appetite as evidenced by meal completion < 50%. Ongoing.   GOAL:   Patient will meet greater than or equal to 90% of their needs Progressing.   MONITOR:   PO intake, Supplement acceptance  ASSESSMENT:   Pt with PMH of HTN, severe psych illness, bipolar 1, schizophrenia, and depression on multiple medications. Pt found down with possible overdose. Pt developed acute kidney injury per MD likely r/t ATN and severe rhabdo.   3/8 HD started for AKI (treatments on 3/8 and 3/9)  Pt discussed during ICU rounds and with RN.  PT recommends CIR Pt reports hip pain, per MD likely due to swelling Pt ate 25% of her breakfast this am but then vomited when she got up with PT.  Pt would like snacks between meals and chocolate milk with meals as a way to supplement her intake.  Pt off of her psych meds and RN reports that pt is hearing voices.   Labs reviewed: Na 128 (L), BUN/Cr 55/7.71 (H) - rising, PO4: 6.8 (H) Medications reviewed   D5NS @ 50 ml/hr UOP: 88 ml x 24 hr Pt is positive 11.2 L since admission, admission weight 114.1 kg (251 lb) - pt up 27 lb; pt edematous. Pt reports usual body weight of 230 lb    Diet Order:  Diet regular Room service appropriate? Yes; Fluid consistency: Thin  EDUCATION NEEDS:   No education needs have been identified at this time  Skin:  Skin Assessment: Reviewed RN Assessment  Last BM:  3/9  Height:   Ht Readings from Last 1 Encounters:  04/05/17 5' 6.5" (1.689 m)    Weight:   Wt Readings from Last 1 Encounters:  04/11/17 278 lb (126.1 kg)    Ideal Body Weight:  60.23 kg  BMI:  Body mass index is 44.2 kg/m.  Estimated Nutritional Needs:   Kcal:  1900-2100  Protein:  110-132 grams  Fluid:  >/= 1.9 L/day  Maylon Peppers RD, LDN, CNSC [phone] Pager [phone] After Hours  Pager

## 2017-04-11 NOTE — Procedures (Signed)
Patient was seen on dialysis and the procedure was supervised.  BFR 150(just getting started)   Via vascath  BP is  147/92.   Patient appears to be tolerating treatment well  Jenna Hill A 04/11/2017

## 2017-04-11 NOTE — Progress Notes (Signed)
Messaged Dr. Wynelle Cleveland :4N ICU 17 given Fentanyl 64mcg at 6:12, now asking for breakthrough pain med for 9/10 pain R hip/thigh.

## 2017-04-11 NOTE — Progress Notes (Signed)
PROGRESS NOTE    Jenna Hill   IRW:[id number]  DOB: Jun 20, 1970  DOA: 04/05/2017 PCP: Alfonse Spruce, FNP   Brief Narrative:  Jenna Hill is a 47 y/o F with h/o bipolar disorder, schizophrenia and HTN who presented the the hospital from home on 3/5 obtunded with hypothermia & tachycardia. Narcan did not help. She was intubated for airway protection. Vomited during intubation. Lactic acid 10.54,  K 2.7, BUN 21, Cr 2.01 CK > 50,000, Lipase 207, ASH 349, ALD 184 CXR >  Vascular congestion Unasyn started on admission for possible aspiration  Received aggressive IV hydration but renal function continued to worsen and nephrology was consulted.   Extubated on 3/8. Dialysis also started same day.   Subjective: Right hip/ leg is hurting today. Vomited up some of her breakfast this AM.  ROS: no complaints of constipation diarrhea, cough, dyspnea or dysuria. No other complaints.   Assessment & Plan:   Active Problems:    Acute encephalopathy   Bipolar 1 disorder     Schizoaffective disorder, depressive type  - suspected medication overdose? - she denied suicidal attempt- she states she only took a dose of Abilify the night before and had no other changes - CT head negative, APAP and Salicylates negative - UDS negative - Trintellix resumed - Clozapine, doxepin, Lunesta, Seroquel are on hold-she is talking to voices she hears in her head- psych has been consulted today    Acute respiratory failure - intubated on admission and extubated on 3/8 - resp virus panel negative - resp culture> no growth - LLL consolidation on CXR - given Unasyn for possibly aspiration as she vomited during intubation- will d/c today- she has had a sufficient course- does no complain of cough  Elevated LFTs - Shock liver vs due to rhabdo     Rhabdomyolysis- severe swelling of right leg  - She may have possibly been laying on the leg for a prolonged period of time resulting in a traumatic  rhabdomyolysis  - CK has been improving- cont to follow -  venous duplex of leg is negative - leg is quite painful mostly in the upper thigh/ hip area were swelling is severe enough to cause pitting edema - area is tender to the touch and palpation- try local measures such as appropriate positioning to take weight off of the area, K pad and Voltaren gel - she is on Fentanyl IV that was started in the ICU as she states that Oxycodone, Hydrocodone and Tramadol all cause hives- unable to give NSAIDs in setting of acute renal failure- have asked her to limit use of this  AKI secondary to rhabdomyolysis - oliguric - cr increased to 7.49 and now improving - renal ultrasound 3/7- no hydronephrosis - dialyzed 3/8 and 3/9 and will go for dialysis again today as urine output remains minimal and Cr is still elevated  Leukocytosis  - WBC normal on admission- has slowly risen to 18- no signs of infection- no cough as mentioned above, no fever- she dose have a foley and is prone to a UTI because of it- will check UA  Vomiting - may be occurred from uremia or Fentanyl- cont PRN Zofran- have asked her to limit Fentanyl- no abdominal pain or tenderness - there is no constipation or diarrhea    Hypertension - HCTZ on hold- cont Norvasc  Lactic acidosis/ hypothermia - improved  DVT prophylaxis:  Heparin Code Status: Full code Family Communication:  Disposition Plan:  Cont to follow SDU for now  Consultants:   PCCM  Nephrology  Procedures:   Intubation-Extubation Antimicrobials:  Anti-infectives (From admission, onward)   Start     Dose/Rate Route Frequency Ordered Stop   04/09/17 2000  Ampicillin-Sulbactam (UNASYN) 3 g in sodium chloride 0.9 % 100 mL IVPB     3 g 200 mL/hr over 30 Minutes Intravenous Every 24 hours 04/09/17 1119 04/11/17 2359   04/08/17 0600  Ampicillin-Sulbactam (UNASYN) 3 g in sodium chloride 0.9 % 100 mL IVPB  Status:  Discontinued     3 g 200 mL/hr over 30 Minutes  Intravenous Every 24 hours 04/07/17 0719 04/09/17 1119   04/06/17 1800  Ampicillin-Sulbactam (UNASYN) 3 g in sodium chloride 0.9 % 100 mL IVPB  Status:  Discontinued     3 g 200 mL/hr over 30 Minutes Intravenous Every 12 hours 04/06/17 0942 04/07/17 0719   04/05/17 1800  Ampicillin-Sulbactam (UNASYN) 3 g in sodium chloride 0.9 % 100 mL IVPB  Status:  Discontinued     3 g 200 mL/hr over 30 Minutes Intravenous Every 6 hours 04/05/17 1620 04/06/17 0942   04/05/17 1630  Ampicillin-Sulbactam (UNASYN) 3 g in sodium chloride 0.9 % 100 mL IVPB     3 g 200 mL/hr over 30 Minutes Intravenous  Once 04/05/17 1608 04/05/17 1844       Objective: Vitals:   04/11/17 0700 04/11/17 0800 04/11/17 0900 04/11/17 1000  BP:  136/79  136/79  Pulse: (!) 114 (!) 114 (!) 116 (!) 119  Resp: (!) 26 18 19 19   Temp:  99.7 F (37.6 C)    TempSrc:  Oral    SpO2: 100% 93% 97% 96%  Weight:      Height:        Intake/Output Summary (Last 24 hours) at 04/11/2017 1132 Last data filed at 04/11/2017 1093 Gross per 24 hour  Intake 1578.34 ml  Output 80 ml  Net 1498.34 ml   Filed Weights   04/09/17 1620 04/10/17 0600 04/11/17 0556  Weight: 124.1 kg (273 lb 9.5 oz) 122.9 kg (270 lb 15.1 oz) 126.1 kg (278 lb)    Examination: General exam: Appears comfortable  HEENT: PERRLA, oral mucosa moist, no sclera icterus or thrush Respiratory system: Clear to auscultation. Respiratory effort normal. Cardiovascular system: S1 & S2 heard, RRR.  No murmurs  Gastrointestinal system: Abdomen soft, non-tender, nondistended. Normal bowel sound. No organomegaly Central nervous system: Alert and oriented. No focal neurological deficits. Extremities: No cyanosis, clubbing - significant edema in right leg from foot to hip with pitting in the hip and thigh area- leg is tender to palpation Skin: No rashes or ulcers Psychiatry:  Mood & affect appropriate. She is talking to the voices inside her head.  Data Reviewed: I have personally  reviewed following labs and imaging studies  CBC: Recent Labs  Lab 04/06/17 0306 04/07/17 0247 04/09/17 0601 04/10/17 0423 04/11/17 0739  WBC 15.9* 17.7* 16.2* 16.9* 18.0*  HGB 14.5 12.8 9.9* 10.2* 9.8*  HCT 42.4 38.5 29.0* 30.0* 28.3*  MCV 87.1 88.9 86.1 86.5 85.5  PLT 286 217 182 191 235   Basic Metabolic Panel: Recent Labs  Lab 04/05/17 1355  04/06/17 0306 04/07/17 0247 04/08/17 0436 04/09/17 0601 04/10/17 0423 04/11/17 0739  NA 138   < > 143 142 141 137 135 128*  K 2.7*   < > 4.0 3.4* 3.4* 3.5 3.7 3.7  CL 101   < > 110 106 103 97* 98* 92*  CO2 15*  --  22 22 21*  24 25 23   GLUCOSE 322*   < > 123* 155* 125* 123* 139* 114*  BUN 21*   < > 26* 48* 70* 54* 36* 55*  CREATININE 2.01*   < > 2.94* 5.22* 7.49* 6.71* 5.45* 7.71*  CALCIUM 8.6*  --  7.5* 7.0* 6.8* 7.0* 7.3* 7.6*  MG 2.2  --  1.6*  --  2.2 2.0  --   --   PHOS  --   --  4.6  --  7.5* 6.8*  --   --    < > = values in this interval not displayed.   GFR: Estimated Creatinine Clearance: 12.5 mL/min (A) (by C-G formula based on SCr of 7.71 mg/dL (H)). Liver Function Tests: Recent Labs  Lab 04/05/17 1355 04/06/17 0306 04/07/17 0247 04/08/17 0436 04/09/17 0601  AST 349* 805* 733* 492* 423*  ALT 184* 331* 311* 240* 233*  ALKPHOS 41 34* 35* 47 92  BILITOT 0.6 0.6 0.7 0.7 1.0  PROT 7.6 5.5* 5.2* 7.2 5.5*  ALBUMIN 3.8 2.6* 2.2* 1.9* 2.0*   Recent Labs  Lab 04/05/17 1355  LIPASE 207*   No results for input(s): AMMONIA in the last 168 hours. Coagulation Profile: No results for input(s): INR, PROTIME in the last 168 hours. Cardiac Enzymes: Recent Labs  Lab 04/05/17 1600 04/06/17 0306 04/07/17 0247 04/09/17 0601  CKTOTAL >50,000* >50,000* >50,000* 37,372*   BNP (last 3 results) No results for input(s): PROBNP in the last 8760 hours. HbA1C: No results for input(s): HGBA1C in the last 72 hours. CBG: Recent Labs  Lab 04/10/17 1957 04/10/17 2307 04/11/17 0352 04/11/17 0815 04/11/17 1129  GLUCAP 98  96 92 91 90   Lipid Profile: Recent Labs    04/09/17 0601  TRIG 188*   Thyroid Function Tests: No results for input(s): TSH, T4TOTAL, FREET4, T3FREE, THYROIDAB in the last 72 hours. Anemia Panel: No results for input(s): VITAMINB12, FOLATE, FERRITIN, TIBC, IRON, RETICCTPCT in the last 72 hours. Urine analysis:    Component Value Date/Time   COLORURINE YELLOW 04/05/2017 1427   APPEARANCEUR HAZY (A) 04/05/2017 1427   LABSPEC 1.021 04/05/2017 1427   PHURINE 5.0 04/05/2017 1427   GLUCOSEU 150 (A) 04/05/2017 1427   HGBUR NEGATIVE 04/05/2017 1427   BILIRUBINUR NEGATIVE 04/05/2017 1427   KETONESUR 5 (A) 04/05/2017 1427   PROTEINUR 30 (A) 04/05/2017 1427   UROBILINOGEN 0.2 08/02/2008 0812   NITRITE NEGATIVE 04/05/2017 1427   LEUKOCYTESUR NEGATIVE 04/05/2017 1427   Sepsis Labs: @LABRCNTIP (procalcitonin:4,lacticidven:4) ) Recent Results (from the past 240 hour(s))  Respiratory Panel by PCR     Status: None   Collection Time: 04/05/17  4:01 PM  Result Value Ref Range Status   Adenovirus NOT DETECTED NOT DETECTED Final   Coronavirus 229E NOT DETECTED NOT DETECTED Final   Coronavirus HKU1 NOT DETECTED NOT DETECTED Final   Coronavirus NL63 NOT DETECTED NOT DETECTED Final   Coronavirus OC43 NOT DETECTED NOT DETECTED Final   Metapneumovirus NOT DETECTED NOT DETECTED Final   Rhinovirus / Enterovirus NOT DETECTED NOT DETECTED Final   Influenza A NOT DETECTED NOT DETECTED Final   Influenza B NOT DETECTED NOT DETECTED Final   Parainfluenza Virus 1 NOT DETECTED NOT DETECTED Final   Parainfluenza Virus 2 NOT DETECTED NOT DETECTED Final   Parainfluenza Virus 3 NOT DETECTED NOT DETECTED Final   Parainfluenza Virus 4 NOT DETECTED NOT DETECTED Final   Respiratory Syncytial Virus NOT DETECTED NOT DETECTED Final   Bordetella pertussis NOT DETECTED NOT DETECTED Final  Chlamydophila pneumoniae NOT DETECTED NOT DETECTED Final   Mycoplasma pneumoniae NOT DETECTED NOT DETECTED Final    Comment:  Performed at [facility], [place] [address], [place], [place] [address]  Culture, blood (routine x 2)     Status: None   Collection Time: 04/05/17  5:51 PM  Result Value Ref Range Status   Specimen Description   Final    BLOOD RIGHT HAND Performed at [facility] [address], [place], [place] [address]    Special Requests   Final    IN PEDIATRIC BOTTLE Blood Culture adequate volume Performed at [facility] [address] [place], [place] [address]    Culture   Final    NO GROWTH 5 DAYS Performed at [facility], [place] [address] [place], [place] [address]    Report Status 04/10/2017 FINAL  Final  Culture, blood (routine x 2)     Status: None   Collection Time: 04/05/17  5:51 PM  Result Value Ref Range Status   Specimen Description   Final    BLOOD RIGHT HAND Performed at [facility] [address] [place], [place] [address]    Special Requests   Final    IN PEDIATRIC BOTTLE Blood Culture adequate volume Performed at [facility] [address] [place], [place] [address]    Culture   Final    NO GROWTH 5 DAYS Performed at [facility], [place] [address], [place], [place] [address]    Report Status 04/10/2017 FINAL  Final  MRSA PCR Screening     Status: None   Collection Time: 04/05/17  6:13 PM  Result Value Ref Range Status   MRSA by PCR NEGATIVE NEGATIVE Final    Comment:        The GeneXpert MRSA Assay (FDA approved for NASAL specimens only), is one component of a comprehensive MRSA colonization surveillance program. It is not intended to diagnose MRSA infection nor to guide or monitor treatment for MRSA infections. Performed at [facility], [place] [address] [place], [place] [address]          Radiology Studies: Ct Hip Right Wo Contrast  Result Date: 04/09/2017 CLINICAL DATA:  Right hip and thigh swelling after fall. EXAM: CT OF THE RIGHT HIP  AND FEMUR WITHOUT CONTRAST TECHNIQUE: Multidetector CT imaging of the right hip was performed according to the standard protocol. Multiplanar CT image reconstructions were also generated. COMPARISON:  None. FINDINGS: Bones/Joint/Cartilage No acute fracture or dislocation. Joint spaces are preserved. Trace suprapatellar knee joint effusion. Bone mineralization is normal. Ligaments Suboptimally assessed by CT. Muscles and Tendons Grossly unremarkable.  No muscle atrophy. Soft tissues Moderate subcutaneous edema and fat stranding about the right lateral hip and circumferentially through the thigh. Small amount of fascial fluid along the right vastus lateralis muscle. No drainable fluid collection. No hematoma. IMPRESSION: 1. Moderate subcutaneous edema and fat stranding about the right lateral hip and circumferentially through the thigh, nonspecific, but can be seen with cellulitis. No drainable fluid collection. 2.  No acute osseous abnormality. Electronically Signed   By: Titus Dubin M.D.   On: 04/09/2017 15:04   Ct Femur Right Wo Contrast  Result Date: 04/09/2017 CLINICAL DATA:  Right hip and thigh swelling after fall. EXAM: CT OF THE RIGHT HIP AND FEMUR WITHOUT CONTRAST TECHNIQUE: Multidetector CT imaging of the right hip was performed according to the standard protocol. Multiplanar CT image reconstructions were  also generated. COMPARISON:  None. FINDINGS: Bones/Joint/Cartilage No acute fracture or dislocation. Joint spaces are preserved. Trace suprapatellar knee joint effusion. Bone mineralization is normal. Ligaments Suboptimally assessed by CT. Muscles and Tendons Grossly unremarkable.  No muscle atrophy. Soft tissues Moderate subcutaneous edema and fat stranding about the right lateral hip and circumferentially through the thigh. Small amount of fascial fluid along the right vastus lateralis muscle. No drainable fluid collection. No hematoma. IMPRESSION: 1. Moderate subcutaneous edema and fat stranding  about the right lateral hip and circumferentially through the thigh, nonspecific, but can be seen with cellulitis. No drainable fluid collection. 2.  No acute osseous abnormality. Electronically Signed   By: Titus Dubin M.D.   On: 04/09/2017 15:04      Scheduled Meds: . amLODipine  5 mg Oral Daily  . Chlorhexidine Gluconate Cloth  6 each Topical Q0600  . diclofenac sodium  4 g Topical QID  . heparin injection (subcutaneous)  5,000 Units Subcutaneous Q8H  . ranitidine  150 mg Oral BID  . vortioxetine HBr  20 mg Oral QHS   Continuous Infusions: . ampicillin-sulbactam (UNASYN) IV Stopped (04/10/17 2035)  . dextrose 50 mL (04/11/17 0025)     LOS: 6 days    Time spent in minutes: [place], MD [facility] Pager: [email or web address] Password [facility] 04/11/2017, 11:32 AM

## 2017-04-12 ENCOUNTER — Encounter (HOSPITAL_COMMUNITY): Payer: Self-pay | Admitting: Physical Medicine and Rehabilitation

## 2017-04-12 ENCOUNTER — Inpatient Hospital Stay (HOSPITAL_COMMUNITY): Payer: Medicaid Other

## 2017-04-12 DIAGNOSIS — D72829 Elevated white blood cell count, unspecified: Secondary | ICD-10-CM

## 2017-04-12 LAB — CBC
HCT: 29.6 % — ABNORMAL LOW (ref 36.0–46.0)
Hemoglobin: 10 g/dL — ABNORMAL LOW (ref 12.0–15.0)
MCH: 29.2 pg (ref 26.0–34.0)
MCHC: 33.8 g/dL (ref 30.0–36.0)
MCV: 86.3 fL (ref 78.0–100.0)
PLATELETS: 205 10*3/uL (ref 150–400)
RBC: 3.43 MIL/uL — ABNORMAL LOW (ref 3.87–5.11)
RDW: 13.4 % (ref 11.5–15.5)
WBC: 20.9 10*3/uL — ABNORMAL HIGH (ref 4.0–10.5)

## 2017-04-12 LAB — IRON AND TIBC
Iron: 31 ug/dL (ref 28–170)
Saturation Ratios: 16 % (ref 10.4–31.8)
TIBC: 196 ug/dL — ABNORMAL LOW (ref 250–450)
UIBC: 165 ug/dL

## 2017-04-12 LAB — COMPREHENSIVE METABOLIC PANEL
ALBUMIN: 1.8 g/dL — AB (ref 3.5–5.0)
ALK PHOS: 117 U/L (ref 38–126)
ALT: 158 U/L — AB (ref 14–54)
AST: 152 U/L — ABNORMAL HIGH (ref 15–41)
Anion gap: 12 (ref 5–15)
BUN: 43 mg/dL — AB (ref 6–20)
CALCIUM: 8 mg/dL — AB (ref 8.9–10.3)
CO2: 25 mmol/L (ref 22–32)
CREATININE: 6.84 mg/dL — AB (ref 0.44–1.00)
Chloride: 94 mmol/L — ABNORMAL LOW (ref 101–111)
GFR calc Af Amer: 8 mL/min — ABNORMAL LOW (ref >60)
GFR calc non Af Amer: 7 mL/min — ABNORMAL LOW (ref >60)
GLUCOSE: 109 mg/dL — AB (ref 65–99)
Potassium: 4.1 mmol/L (ref 3.5–5.1)
SODIUM: 131 mmol/L — AB (ref 135–145)
Total Bilirubin: 0.9 mg/dL (ref 0.3–1.2)
Total Protein: 5.5 g/dL — ABNORMAL LOW (ref 6.5–8.1)

## 2017-04-12 LAB — CK: Total CK: 4558 U/L — ABNORMAL HIGH (ref 38–234)

## 2017-04-12 LAB — FERRITIN: Ferritin: 302 ng/mL (ref 11–307)

## 2017-04-12 MED ORDER — SODIUM CHLORIDE 0.9 % IV SOLN
125.0000 mg | Freq: Every day | INTRAVENOUS | Status: AC
  Administered 2017-04-12 – 2017-04-16 (×5): 125 mg via INTRAVENOUS
  Filled 2017-04-12 (×11): qty 10

## 2017-04-12 MED ORDER — HYDRALAZINE HCL 20 MG/ML IJ SOLN
10.0000 mg | INTRAMUSCULAR | Status: DC | PRN
  Administered 2017-04-15 – 2017-04-23 (×3): 10 mg via INTRAVENOUS
  Filled 2017-04-12 (×6): qty 1

## 2017-04-12 MED ORDER — QUETIAPINE FUMARATE ER 400 MG PO TB24
400.0000 mg | ORAL_TABLET | Freq: Every day | ORAL | Status: DC
  Administered 2017-04-12 – 2017-04-28 (×17): 400 mg via ORAL
  Filled 2017-04-12 (×19): qty 1

## 2017-04-12 MED ORDER — CLOZAPINE 100 MG PO TABS
400.0000 mg | ORAL_TABLET | Freq: Every day | ORAL | Status: DC
  Administered 2017-04-12: 400 mg via ORAL
  Filled 2017-04-12 (×2): qty 4

## 2017-04-12 MED ORDER — FENTANYL CITRATE (PF) 100 MCG/2ML IJ SOLN
25.0000 ug | INTRAMUSCULAR | Status: DC | PRN
  Administered 2017-04-12 – 2017-04-14 (×6): 25 ug via INTRAVENOUS
  Filled 2017-04-12 (×6): qty 2

## 2017-04-12 MED ORDER — FENTANYL CITRATE (PF) 100 MCG/2ML IJ SOLN: 25.0000 ug | INTRAMUSCULAR | Status: DC | PRN

## 2017-04-12 NOTE — Progress Notes (Signed)
Late Entry Evaluation Note from 04/08/17.  Pt admitted with/for AMS and hypothermia.  Pt needing min to mod assist for basic mobility. Of note, pt not showing df/pf on R LE.  Pt currently limited functionally due to the problems listed. ( See problems list.)   Pt will benefit from PT to maximize function and safety in order to get ready for next venue listed below.    04/08/17 1200  PT Visit Information  Last PT Received On 04/08/17  Assistance Needed +2 (gait)  History of Present Illness pt is a 47 y/o female with pmh significant for schizophrenia, HTN, bipolar d/o and attempted suicide admitted with AMS and hypothermia, suspected intentional OD of psych medication.  Treating for aspiration and sepsis.  Extubated 3/8.  Precautions  Precautions Fall  Precaution Comments No pf/df at R Foot.  Home Living  Family/patient expects to be discharged to: Private residence  Living Arrangements Alone  Available Help at Discharge Friend(s);Available PRN/intermittently (as needed if called)  Type of Home Apartment  Home Access Stairs to enter  Entrance Stairs-Number of Steps 3  Entrance Stairs-Rails None  Home Layout One level  Bathroom Shower/Tub Tub/shower unit  Automotive engineer None  Prior Function  Level of Independence Independent  Comments drove and ran her own errands, but didn't work due to her schizophrenia.  Communication  Communication No difficulties (a little soft spoken)  Pain Assessment  Pain Assessment Faces  Faces Pain Scale 0  Pain Intervention(s) Monitored during session  Cognition  Arousal/Alertness Awake/alert  Behavior During Therapy WFL for tasks assessed/performed  Overall Cognitive Status Within Functional Limits for tasks assessed (not formally assessed)  Upper Extremity Assessment  Upper Extremity Assessment Defer to OT evaluation  Lower Extremity Assessment  Lower Extremity Assessment RLE deficits/detail;LLE deficits/detail  RLE  Deficits / Details grossly 4- to 4/5 except pf/df 0 to trace/5  RLE Coordination decreased fine motor  LLE Deficits / Details WFL grossly 4/5  Bed Mobility  Overal bed mobility Needs Assistance  Bed Mobility Rolling;Sidelying to Sit  Rolling Min assist  Sidelying to sit Min assist  General bed mobility comments cues for sequence of task, minimal assist LE and trunk  Transfers  Overall transfer level Needs assistance  Equipment used Rolling walker (2 wheeled)  Transfers Sit to/from Stand;Stand Pivot Transfers  Sit to Stand Mod assist  Stand pivot transfers Mod assist  General transfer comment pt with no active pf/df on the R LE with abscence of light sensation.  Movement of R LE is uncoordinated with steppage movement, need for visual assist to place R foot and stability assist throughout.  Balance  Overall balance assessment Needs assistance  Sitting-balance support No upper extremity supported;Feet supported  Sitting balance-Leahy Scale Fair  Sitting balance - Comments able to move out of midline/BOS, but guarded  Standing balance support Bilateral upper extremity supported  Standing balance-Leahy Scale Poor  Standing balance comment reliant on the RW due to R foot instability  General Comments  General comments (skin integrity, edema, etc.) HR tachy in the 120's to 130's, sats on 4L stable overall in the mid 90's or better.  PT - End of Session  Activity Tolerance Patient tolerated treatment well  Patient left in chair;with call bell/phone within reach;with chair alarm set  Nurse Communication Mobility status  PT Assessment  PT Recommendation/Assessment Patient needs continued PT services  PT Visit Diagnosis Unsteadiness on feet (R26.81);Other abnormalities of gait and mobility (R26.89);Muscle weakness (generalized) (M62.81);Other symptoms and  signs involving the nervous system (R29.898)  PT Problem List Decreased strength;Decreased activity tolerance;Decreased balance;Decreased  mobility;Decreased coordination;Decreased knowledge of use of DME;Impaired sensation  Barriers to Discharge Decreased caregiver support  PT Plan  PT Frequency (ACUTE ONLY) Min 3X/week  PT Treatment/Interventions (ACUTE ONLY) DME instruction;Gait training;Functional mobility training;Therapeutic activities;Balance training;Patient/family education  AM-PAC PT "6 Clicks" Daily Activity Outcome Measure  Difficulty turning over in bed (including adjusting bedclothes, sheets and blankets)? 1  Difficulty moving from lying on back to sitting on the side of the bed?  1  Difficulty sitting down on and standing up from a chair with arms (e.g., wheelchair, bedside commode, etc,.)? 1  Help needed moving to and from a bed to chair (including a wheelchair)? 2  Help needed walking in hospital room? 2  Help needed climbing 3-5 steps with a railing?  2  6 Click Score 9  Mobility G Code  CL  PT Recommendation  Recommendations for Other Services Rehab consult  Follow Up Recommendations CIR  PT equipment Other (comment) (TBA)  Individuals Consulted  Consulted and Agree with Results and Recommendations Patient  Acute Rehab PT Goals  Patient Stated Goal pt didn't state any specific goal, but wants to be able to live alone.  PT Goal Formulation With patient  Time For Goal Achievement 04/22/17  Potential to Achieve Goals Good  PT Time Calculation  PT Start Time (ACUTE ONLY) 1108  PT Stop Time (ACUTE ONLY) 1150  PT Time Calculation (min) (ACUTE ONLY) 42 min  PT General Charges  $$ ACUTE PT VISIT 1 Visit  PT Evaluation  $PT Eval Moderate Complexity 1 Mod  PT Treatments  $Therapeutic Activity 23-37 mins   04/12/2017  Donnella Sham, PT [phone]  (pager)

## 2017-04-12 NOTE — Progress Notes (Signed)
PROGRESS NOTE    Jenna Hill   YWV:[id number]  DOB: [birth year]  DOA: 04/05/2017 PCP: Alfonse Spruce, FNP   Brief Narrative:  Jenna Hill is a 47 y/o F with h/o bipolar disorder, schizophrenia and HTN who presented the the hospital from home on 3/5 obtunded with hypothermia & tachycardia. Narcan did not help. She was intubated for airway protection. Vomited during intubation. Lactic acid 10.54,  K 2.7, BUN 21, Cr 2.01 CK > 50,000, Lipase 207, ASH 349, ALD 184 CXR >  Vascular congestion Unasyn started on admission for possible aspiration  Received aggressive IV hydration but renal function continued to worsen and nephrology was consulted.   Extubated on 3/8. Dialysis also started same day.   Subjective:  She has no new complaints today. Right hip/thigh still are painful ROS: no complaints of constipation diarrhea, cough, dyspnea or dysuria. No other complaints.   Assessment & Plan:   Active Problems:    Acute encephalopathy   Bipolar 1 disorder     Schizoaffective disorder, depressive type  - suspected medication overdose? - she denied suicidal attempt- she states she only took a dose of Abilify the night before and had no other changes - CT head negative, APAP and Salicylates negative - UDS negative - Trintellix resumed - Clozapine, doxepin, Lunesta, Seroquel are on hold due to Rhabdomyolysis/ AKI - will resume today -she is talking to voices she hears in her head- psych was consulted 3/11- awaiting consult    Acute respiratory failure - intubated on admission and extubated on 3/8 - resp virus panel negative - resp culture> no growth - LLL consolidation on CXR - given Unasyn for possibly aspiration as she vomited during intubation-received a 5 day course-- does no complain of cough or dyspnea      Rhabdomyolysis- severe swelling of right leg  - She may have possibly been laying on the leg for a prolonged period of time resulting in a traumatic rhabdomyolysis    - CK has been improving- cont to follow - CT  On 3/9: Moderate subcutaneous edema and fat stranding about the right lateral hip and circumferentially through the thigh, nonspecific, but can be seen with cellulitis. No drainable fluid collection. -  venous duplex of leg is negative - leg is quite painful mostly in the upper thigh/ hip area were swelling is severe enough to cause pitting edema - area is tender to the touch and palpation- try local measures such as appropriate positioning to take weight off of the area, K pad and Voltaren gel - she is on Fentanyl IV that was started in the ICU as she states that Oxycodone, Hydrocodone and Tramadol all cause hives- unable to give NSAIDs in setting of acute renal failure- have asked her to limit use of this  AKI secondary to rhabdomyolysis - oliguric - Cr increased to 7.49   - renal ultrasound 3/7- no hydronephrosis - nephrology was consulted by ICU team and dialysis was started on 3/8  - nephrology following for ongoing dialysis needs - CPK now 4558  Elevated LFTs - Shock liver vs due to rhabdo- improving  Leukocytosis  - WBC normal on admission- has slowly risen to 20- no signs of infection- no cough as mentioned above, no fever, no diarrhea - she dose have a foley and is prone to a UTI because of it  - UA mostly showing hematuria (has gross hematuria) and a small amount of WBC and bacteria  - she also has a dialysis catheter and is  at risk for bacteremia - she is having intermittent vomiting- had it in the AM yesterday and today but is able to tolerate food and drink the rest of the day- abd xray and abdominal exam unrevealing - will check blood cultures today- cont to follow WBC count  Vomiting - may be occurred from uremia or Fentanyl- cont PRN Zofran- have asked her to limit Fentanyl- no abdominal pain or tenderness - there is no constipation or diarrhea    Hypertension - HCTZ on hold- cont Norvasc  Lactic acidosis/ hypothermia -  improved  DVT prophylaxis:  Heparin Code Status: Full code Family Communication:  Disposition Plan:  Cont to follow SDU for now Consultants:   PCCM  Nephrology  Procedures:   Intubation-Extubation Antimicrobials:  Anti-infectives (From admission, onward)   Start     Dose/Rate Route Frequency Ordered Stop   04/09/17 2000  Ampicillin-Sulbactam (UNASYN) 3 g in sodium chloride 0.9 % 100 mL IVPB  Status:  Discontinued     3 g 200 mL/hr over 30 Minutes Intravenous Every 24 hours 04/09/17 1119 04/11/17 1134   04/08/17 0600  Ampicillin-Sulbactam (UNASYN) 3 g in sodium chloride 0.9 % 100 mL IVPB  Status:  Discontinued     3 g 200 mL/hr over 30 Minutes Intravenous Every 24 hours 04/07/17 0719 04/09/17 1119   04/06/17 1800  Ampicillin-Sulbactam (UNASYN) 3 g in sodium chloride 0.9 % 100 mL IVPB  Status:  Discontinued     3 g 200 mL/hr over 30 Minutes Intravenous Every 12 hours 04/06/17 0942 04/07/17 0719   04/05/17 1800  Ampicillin-Sulbactam (UNASYN) 3 g in sodium chloride 0.9 % 100 mL IVPB  Status:  Discontinued     3 g 200 mL/hr over 30 Minutes Intravenous Every 6 hours 04/05/17 1620 04/06/17 0942   04/05/17 1630  Ampicillin-Sulbactam (UNASYN) 3 g in sodium chloride 0.9 % 100 mL IVPB     3 g 200 mL/hr over 30 Minutes Intravenous  Once 04/05/17 1608 04/05/17 1844       Objective: Vitals:   04/12/17 0504 04/12/17 0610 04/12/17 0800 04/12/17 0810  BP:  137/76    Pulse:  (!) 113 (!) 109   Resp:  18 19   Temp: 98.2 F (36.8 C)   98.2 F (36.8 C)  TempSrc: Oral   Oral  SpO2:  95% 97%   Weight:      Height:        Intake/Output Summary (Last 24 hours) at 04/12/2017 1240 Last data filed at 04/12/2017 0100 Gross per 24 hour  Intake 650 ml  Output 2505 ml  Net -1855 ml   Filed Weights   04/10/17 0600 04/11/17 0556 04/12/17 0205  Weight: 122.9 kg (270 lb 15.1 oz) 126.1 kg (278 lb) 124.2 kg (273 lb 13 oz)    Examination: General exam: Appears comfortable  HEENT: PERRLA, oral  mucosa moist, no sclera icterus or thrush Respiratory system: Clear to auscultation. Respiratory effort normal. Cardiovascular system: S1 & S2 heard, RRR.  No murmurs  Gastrointestinal system: Abdomen soft, non-tender, nondistended. Normal bowel sound. No organomegaly Central nervous system: Alert and oriented. No focal neurological deficits. Extremities: No cyanosis, clubbing - significant edema in right leg from foot to hip with pitting in the hip and thigh area- leg is tender to palpation Skin: No rashes or ulcers Psychiatry:  Mood & affect appropriate. She is talking to the voices inside her head.  Data Reviewed: I have personally reviewed following labs and imaging studies  CBC: Recent  Labs  Lab 04/07/17 0247 04/09/17 0601 04/10/17 0423 04/11/17 0739 04/12/17 0835  WBC 17.7* 16.2* 16.9* 18.0* 20.9*  HGB 12.8 9.9* 10.2* 9.8* 10.0*  HCT 38.5 29.0* 30.0* 28.3* 29.6*  MCV 88.9 86.1 86.5 85.5 86.3  PLT 217 182 191 181 462   Basic Metabolic Panel: Recent Labs  Lab 04/05/17 1355  04/06/17 0306  04/08/17 0436 04/09/17 0601 04/10/17 0423 04/11/17 0739 04/12/17 0835  NA 138   < > 143   < > 141 137 135 128* 131*  K 2.7*   < > 4.0   < > 3.4* 3.5 3.7 3.7 4.1  CL 101   < > 110   < > 103 97* 98* 92* 94*  CO2 15*  --  22   < > 21* 24 25 23 25   GLUCOSE 322*   < > 123*   < > 125* 123* 139* 114* 109*  BUN 21*   < > 26*   < > 70* 54* 36* 55* 43*  CREATININE 2.01*   < > 2.94*   < > 7.49* 6.71* 5.45* 7.71* 6.84*  CALCIUM 8.6*  --  7.5*   < > 6.8* 7.0* 7.3* 7.6* 8.0*  MG 2.2  --  1.6*  --  2.2 2.0  --   --   --   PHOS  --   --  4.6  --  7.5* 6.8*  --   --   --    < > = values in this interval not displayed.   GFR: Estimated Creatinine Clearance: 14 mL/min (A) (by C-G formula based on SCr of 6.84 mg/dL (H)). Liver Function Tests: Recent Labs  Lab 04/06/17 0306 04/07/17 0247 04/08/17 0436 04/09/17 0601 04/12/17 0835  AST 805* 733* 492* 423* 152*  ALT 331* 311* 240* 233* 158*    ALKPHOS 34* 35* 47 92 117  BILITOT 0.6 0.7 0.7 1.0 0.9  PROT 5.5* 5.2* 7.2 5.5* 5.5*  ALBUMIN 2.6* 2.2* 1.9* 2.0* 1.8*   Recent Labs  Lab 04/05/17 1355  LIPASE 207*   No results for input(s): AMMONIA in the last 168 hours. Coagulation Profile: No results for input(s): INR, PROTIME in the last 168 hours. Cardiac Enzymes: Recent Labs  Lab 04/05/17 1600 04/06/17 0306 04/07/17 0247 04/09/17 0601 04/12/17 0835  CKTOTAL >50,000* >50,000* >50,000* 70,350* 4,558*   BNP (last 3 results) No results for input(s): PROBNP in the last 8760 hours. HbA1C: No results for input(s): HGBA1C in the last 72 hours. CBG: Recent Labs  Lab 04/10/17 1957 04/10/17 2307 04/11/17 0352 04/11/17 0815 04/11/17 1129  GLUCAP 98 96 92 91 90   Lipid Profile: No results for input(s): CHOL, HDL, LDLCALC, TRIG, CHOLHDL, LDLDIRECT in the last 72 hours. Thyroid Function Tests: No results for input(s): TSH, T4TOTAL, FREET4, T3FREE, THYROIDAB in the last 72 hours. Anemia Panel: Recent Labs    04/12/17 0835  FERRITIN 302  TIBC 196*  IRON 31   Urine analysis:    Component Value Date/Time   COLORURINE BROWN (A) 04/11/2017 1814   APPEARANCEUR TURBID (A) 04/11/2017 1814   LABSPEC 1.020 04/11/2017 1814   PHURINE 7.0 04/11/2017 1814   GLUCOSEU NEGATIVE 04/11/2017 1814   HGBUR LARGE (A) 04/11/2017 1814   BILIRUBINUR SMALL (A) 04/11/2017 1814   KETONESUR NEGATIVE 04/11/2017 1814   PROTEINUR 100 (A) 04/11/2017 1814   UROBILINOGEN 0.2 08/02/2008 0812   NITRITE POSITIVE (A) 04/11/2017 1814   LEUKOCYTESUR TRACE (A) 04/11/2017 1814   Sepsis Labs: @LABRCNTIP (procalcitonin:4,lacticidven:4) ) Recent  Results (from the past 240 hour(s))  Respiratory Panel by PCR     Status: None   Collection Time: 04/05/17  4:01 PM  Result Value Ref Range Status   Adenovirus NOT DETECTED NOT DETECTED Final   Coronavirus 229E NOT DETECTED NOT DETECTED Final   Coronavirus HKU1 NOT DETECTED NOT DETECTED Final    Coronavirus NL63 NOT DETECTED NOT DETECTED Final   Coronavirus OC43 NOT DETECTED NOT DETECTED Final   Metapneumovirus NOT DETECTED NOT DETECTED Final   Rhinovirus / Enterovirus NOT DETECTED NOT DETECTED Final   Influenza A NOT DETECTED NOT DETECTED Final   Influenza B NOT DETECTED NOT DETECTED Final   Parainfluenza Virus 1 NOT DETECTED NOT DETECTED Final   Parainfluenza Virus 2 NOT DETECTED NOT DETECTED Final   Parainfluenza Virus 3 NOT DETECTED NOT DETECTED Final   Parainfluenza Virus 4 NOT DETECTED NOT DETECTED Final   Respiratory Syncytial Virus NOT DETECTED NOT DETECTED Final   Bordetella pertussis NOT DETECTED NOT DETECTED Final   Chlamydophila pneumoniae NOT DETECTED NOT DETECTED Final   Mycoplasma pneumoniae NOT DETECTED NOT DETECTED Final    Comment: Performed at [facility], [place] [address], [place], [place] [address]  Culture, blood (routine x 2)     Status: None   Collection Time: 04/05/17  5:51 PM  Result Value Ref Range Status   Specimen Description   Final    BLOOD RIGHT HAND Performed at [facility] [address] [place], [place] [address]    Special Requests   Final    IN PEDIATRIC BOTTLE Blood Culture adequate volume Performed at [facility] [address] [place], [place] [address]    Culture   Final    NO GROWTH 5 DAYS Performed at [facility], [place] [address] [place], [place] [address]    Report Status 04/10/2017 FINAL  Final  Culture, blood (routine x 2)     Status: None   Collection Time: 04/05/17  5:51 PM  Result Value Ref Range Status   Specimen Description   Final    BLOOD RIGHT HAND Performed at [facility] [address], [place], [place] [address]    Special Requests   Final    IN PEDIATRIC BOTTLE Blood Culture adequate volume Performed at [facility] [address], [place], [place] [address]    Culture   Final    NO GROWTH 5 DAYS Performed at  [facility], [place] [address] [place], [place] [address]    Report Status 04/10/2017 FINAL  Final  MRSA PCR Screening     Status: None   Collection Time: 04/05/17  6:13 PM  Result Value Ref Range Status   MRSA by PCR NEGATIVE NEGATIVE Final    Comment:        The GeneXpert MRSA Assay (FDA approved for NASAL specimens only), is one component of a comprehensive MRSA colonization surveillance program. It is not intended to diagnose MRSA infection nor to guide or monitor treatment for MRSA infections. Performed at [facility], [place] [address], [place], [place] [address]          Radiology Studies: Dg Abd Portable 1v  Result Date: 04/12/2017 CLINICAL DATA:  Acute onset of nausea and vomiting. Generalized abdominal distention. EXAM: PORTABLE ABDOMEN - 1 VIEW COMPARISON:  Renal ultrasound performed 04/07/2017 FINDINGS: The visualized bowel gas pattern is unremarkable. Scattered air and stool filled loops of colon are seen; no  abnormal dilatation of small bowel loops is seen to suggest small bowel obstruction. No free intra-abdominal air is identified, though evaluation for free air is limited on supine views. The visualized osseous structures are within normal limits; the sacroiliac joints are unremarkable in appearance. The visualized lung bases are essentially clear. IMPRESSION: Unremarkable bowel gas pattern; no free intra-abdominal air seen. Electronically Signed   By: Garald Balding M.D.   On: 04/12/2017 03:40      Scheduled Meds: . amLODipine  5 mg Oral Daily  . calcium acetate  667 mg Oral TID WC  . Chlorhexidine Gluconate Cloth  6 each Topical Q0600  . diclofenac sodium  4 g Topical QID  . heparin injection (subcutaneous)  5,000 Units Subcutaneous Q8H  . ranitidine  150 mg Oral BID  . vortioxetine HBr  20 mg Oral QHS   Continuous Infusions: . dextrose 50 mL/hr at 04/12/17 0100  . ferric gluconate (FERRLECIT/NULECIT) IV       LOS: 7 days     Time spent in minutes: [place], MD [facility] Pager: [email or web address] Password [facility] 04/12/2017, 12:40 PM

## 2017-04-12 NOTE — Progress Notes (Addendum)
OT Cancellation Note  Patient Details Name: Jenna Hill MRN: [id number] DOB: October 03, 1970   Cancelled Treatment:    Reason Eval/Treat Not Completed: Other (comment); pt sleeping soundly upon entering room, able to arouse though reporting increased fatigue and recently returned to bed (noted to be dozing off when speaking to therapist); pt politely request OT check back at a later time. Will follow up as schedule permits.   Lou Cal, OT Pager [phone] 04/12/2017  [name] 04/12/2017, 3:42 PM

## 2017-04-12 NOTE — Consult Note (Signed)
Physical Medicine and Rehabilitation Consult   Reason for Consult: Functional deficits.  Referring Physician: Dr. Wynelle Cleveland   HPI: Jenna Hill is a 47 y.o. female with history of bipolar disorder, schizophrenia, HTN , history of overdose/sucide attempts who was admitted after being found down on 04/05/17. Patient was obtunded, with dried vomitus on cheeks, hypothermic, tachycardic and had AKI. UDS negative and encephalopathy felt to be due to suicide attempt. She was intubated for airway protection and started on  IV unasyn for aspiration PNA and IVF for  AKI with rhabdomyolysis. She continued to have worsening of renal status requiring HD. She continues to have volume overload with oliguria. .  She tolerated extubation on 3/8 and therapy evaluations done yesterday revealing gait disorder.  Psych medications resumed and evalution pending.  CIR recommended for follow up therapy.    Review of Systems  Constitutional: Negative for chills and fever.  HENT: Negative for hearing loss.   Eyes: Negative for blurred vision and double vision.  Respiratory: Negative for shortness of breath.   Cardiovascular: Positive for leg swelling. Negative for chest pain.  Gastrointestinal: Positive for abdominal pain and nausea.  Genitourinary:       Foley in place  Musculoskeletal: Positive for back pain and myalgias.  Neurological: Positive for sensory change (numbness from abdomen down to RLE> LLE), focal weakness (RLE), weakness and headaches.  Psychiatric/Behavioral: Positive for hallucinations and memory loss. The patient is nervous/anxious.       Past Medical History:  Diagnosis Date  . Bipolar 1 disorder ([place])   . Hypertension   . Schizophrenia [facility])     Past Surgical History:  Procedure Laterality Date  . ANKLE ARTHROSCOPY Right   . CESAREAN SECTION    . HAND SURGERY Right   . ORIF ANKLE FRACTURE Left 01/29/2016   Procedure: OPEN REDUCTION INTERNAL FIXATION LEFT ANKLE BIMALLEOLAR  FRACTURE AND SYNDESMOSIS;  Surgeon: Wylene Simmer, MD;  Location: [facility];  Service: Orthopedics;  Laterality: Left;    Family History  Problem Relation Age of Onset  . Healthy Mother   . Healthy Father   . Healthy Sister      Social History: Lives alone--has family in [place]. 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 She reports that  has never smoked.  She has never used smokeless tobacco. She reports that she doe not drink alcohol. She reports that she does not use drugs.    Allergies  Allergen Reactions  . Bee Venom Anaphylaxis  . Bretylium     Other reaction(s): Seizures  . Codeine Anaphylaxis  . Other Anaphylaxis    bertrillium or bertillium    . Toradol [Ketorolac Tromethamine] Swelling  . Ibuprofen     Raised hives   . Latex Rash   Medications Prior to Admission  Medication Sig Dispense Refill  . ABILIFY MAINTENA 400 MG PRSY Inject 400 mg into the skin every 30 (thirty) days. 12/18/15  1  . clozapine (CLOZARIL) 200 MG tablet Take 400 mg by mouth at bedtime.     Marland Kitchen doxepin (SINEQUAN) 50 MG capsule Take 50-100 mg by mouth at bedtime.    Marland Kitchen EPINEPHrine 0.3 mg/0.3 mL IJ SOAJ injection Inject 0.3 mLs (0.3 mg total) into the muscle daily as needed (allergic reaction). 1 Device 0  . ESZOPICLONE 3 MG tablet Take 3 mg by mouth at bedtime.  0  . QUEtiapine (SEROQUEL XR) 400  MG 24 hr tablet Take 400 mg by mouth at bedtime.     . TRINTELLIX 20 MG TABS tablet Take 20 mg by mouth daily. With food  2  . acetaminophen (TYLENOL) 500 MG tablet Take 2 tablets (1,000 mg total) by mouth every 6 (six) hours as needed for mild pain or moderate pain. (Patient not taking: Reported on 04/06/2017) 60 tablet 0  . amLODipine (NORVASC) 5 MG tablet Take 1 tablet (5 mg total) by mouth daily. (Patient not taking: Reported on 04/06/2017) 30 tablet 2  . aspirin EC 81 MG tablet Take 1 tablet (81 mg total) by mouth 2 (two) times daily. (Patient not  taking: Reported on 04/06/2017) 90 tablet 0  . hydrochlorothiazide (HYDRODIURIL) 12.5 MG tablet Take 2 tablets (25 mg total) by mouth daily. (Patient not taking: Reported on 04/06/2017) 30 tablet 2  . loratadine (ALLERGY RELIEF) 10 MG tablet Take 10 mg by mouth daily.      Home: Home Living Family/patient expects to be discharged to:: Private residence Living Arrangements: Other relatives Available Help at Discharge: Friend(s), Available PRN/intermittently(as needed if called) Type of Home: Apartment Home Access: Stairs to enter [employer] of Steps: 3 Entrance Stairs-Rails: None Home Layout: One level Bathroom Shower/Tub: Chiropodist: Standard Home Equipment: None  Functional History: Prior Function Level of Independence: Independent Comments: drove and ran her own errands, but didn't work due to her schizophrenia. Functional Status:  Mobility: Bed Mobility Overal bed mobility: Needs Assistance Bed Mobility: Supine to Sit Rolling: Min assist Sidelying to sit: Min assist Supine to sit: Min assist General bed mobility comments: cues for sequence of task,  assist to help RLE off of the bed; minimal handheld assist LE and trunk Transfers Overall transfer level: Needs assistance Equipment used: Rolling walker (2 wheeled) Transfers: Sit to/from Stand Sit to Stand: Mod assist Stand pivot transfers: Mod assist General transfer comment: Mod assist to power up and steady at initial stand Ambulation/Gait Ambulation/Gait assistance: Mod assist, +2 safety/equipment Ambulation Distance (Feet): 10 Feet(with one seated rest break) Assistive device: Rolling walker (2 wheeled) Gait Pattern/deviations: Steppage, Decreased stance time - right, Decreased step length - left(R) General Gait Details: Used Ace wrap R foot for some dorsiflexion assist and for some extra stability R foot, as [place] described feeling like her Right ankle was "rolling" last session; Painful  in R stance     ADL:    Cognition: Cognition Overall Cognitive Status: Within Functional Limits for tasks assessed Orientation Level: Oriented X4 Cognition Arousal/Alertness: Awake/alert Behavior During Therapy: WFL for tasks assessed/performed Overall Cognitive Status: Within Functional Limits for tasks assessed   Blood pressure 137/76, pulse (!) 109, temperature 98.1 F (36.7 C), temperature source Oral, resp. rate 19, height 5' 6.5" (1.689 m), weight 124.2 kg (273 lb 13 oz), SpO2 97 %. Physical Exam  Nursing note and vitals reviewed. Constitutional: She is oriented to person, place, and time. She appears well-developed and well-nourished. No distress.  Obese female. Fatigued appearing.  NAD.   HENT:  Head: Normocephalic and atraumatic.  Mouth/Throat: Oropharynx is clear and moist.  Eyes: Conjunctivae and EOM are normal. Pupils are equal, round, and reactive to light.  Neck: Normal range of motion. Neck supple.  Cardiovascular: Regular rhythm. Tachycardia present.  Respiratory: Effort normal. No stridor. No respiratory distress. She has decreased breath sounds.  GI: Soft. Bowel sounds are normal. She exhibits no distension. There is no tenderness.  Musculoskeletal: She exhibits edema and tenderness (dysesthesis RLE).  2+ edema right  hip/thigh with  pain at attempts at ROM.  Right foot drop with 2+ edema.   Neurological: She is alert and oriented to person, place, and time. Coordination abnormal.  Speech mildly dysarthric. Able to answer biographic questions without difficulty. Unable to recall details of admission--- except " Rhabdomyolysis"  Reports dysesthesias right hemi body. Moves all 4, left more than right  Skin: Skin is warm and dry. She is not diaphoretic.  Psychiatric: Her speech is normal. Her affect is blunt. Cognition and memory are impaired.    Results for orders placed or performed during the hospital encounter of 04/05/17 (from the past 24 hour(s))    Urinalysis, Routine w reflex microscopic     Status: Abnormal   Collection Time: 04/11/17  6:14 PM  Result Value Ref Range   Color, Urine BROWN (A) YELLOW   APPearance TURBID (A) CLEAR   Specific Gravity, Urine 1.020 1.005 - 1.030   pH 7.0 5.0 - 8.0   Glucose, UA NEGATIVE NEGATIVE mg/dL   Hgb urine dipstick LARGE (A) NEGATIVE   Bilirubin Urine SMALL (A) NEGATIVE   Ketones, ur NEGATIVE NEGATIVE mg/dL   Protein, ur 100 (A) NEGATIVE mg/dL   Nitrite POSITIVE (A) NEGATIVE   Leukocytes, UA TRACE (A) NEGATIVE  Urinalysis, Microscopic (reflex)     Status: Abnormal   Collection Time: 04/11/17  6:14 PM  Result Value Ref Range   RBC / HPF TOO NUMEROUS TO COUNT 0 - 5 RBC/hpf   WBC, UA 6-30 0 - 5 WBC/hpf   Bacteria, UA FEW (A) NONE SEEN   Squamous Epithelial / LPF 6-30 (A) NONE SEEN   Mucus PRESENT   Comprehensive metabolic panel     Status: Abnormal   Collection Time: 04/12/17  8:35 AM  Result Value Ref Range   Sodium 131 (L) 135 - 145 mmol/L   Potassium 4.1 3.5 - 5.1 mmol/L   Chloride 94 (L) 101 - 111 mmol/L   CO2 25 22 - 32 mmol/L   Glucose, Bld 109 (H) 65 - 99 mg/dL   BUN 43 (H) 6 - 20 mg/dL   Creatinine, Ser 6.84 (H) 0.44 - 1.00 mg/dL   Calcium 8.0 (L) 8.9 - 10.3 mg/dL   Total Protein 5.5 (L) 6.5 - 8.1 g/dL   Albumin 1.8 (L) 3.5 - 5.0 g/dL   AST 152 (H) 15 - 41 U/L   ALT 158 (H) 14 - 54 U/L   Alkaline Phosphatase 117 38 - 126 U/L   Total Bilirubin 0.9 0.3 - 1.2 mg/dL   GFR calc non Af Amer 7 (L) >60 mL/min   GFR calc Af Amer 8 (L) >60 mL/min   Anion gap 12 5 - 15  CK     Status: Abnormal   Collection Time: 04/12/17  8:35 AM  Result Value Ref Range   Total CK 4,558 (H) 38 - 234 U/L  CBC     Status: Abnormal   Collection Time: 04/12/17  8:35 AM  Result Value Ref Range   WBC 20.9 (H) 4.0 - 10.5 K/uL   RBC 3.43 (L) 3.87 - 5.11 MIL/uL   Hemoglobin 10.0 (L) 12.0 - 15.0 g/dL   HCT 29.6 (L) 36.0 - 46.0 %   MCV 86.3 78.0 - 100.0 fL   MCH 29.2 26.0 - 34.0 pg   MCHC 33.8 30.0 -  36.0 g/dL   RDW 13.4 11.5 - 15.5 %   Platelets 205 150 - 400 K/uL  Iron and TIBC     Status:  Abnormal   Collection Time: 04/12/17  8:35 AM  Result Value Ref Range   Iron 31 28 - 170 ug/dL   TIBC 196 (L) 250 - 450 ug/dL   Saturation Ratios 16 10.4 - 31.8 %   UIBC 165 ug/dL  Ferritin     Status: None   Collection Time: 04/12/17  8:35 AM  Result Value Ref Range   Ferritin 302 11 - 307 ng/mL   Dg Abd Portable 1v  Result Date: 04/12/2017 CLINICAL DATA:  Acute onset of nausea and vomiting. Generalized abdominal distention. EXAM: PORTABLE ABDOMEN - 1 VIEW COMPARISON:  Renal ultrasound performed 04/07/2017 FINDINGS: The visualized bowel gas pattern is unremarkable. Scattered air and stool filled loops of colon are seen; no abnormal dilatation of small bowel loops is seen to suggest small bowel obstruction. No free intra-abdominal air is identified, though evaluation for free air is limited on supine views. The visualized osseous structures are within normal limits; the sacroiliac joints are unremarkable in appearance. The visualized lung bases are essentially clear. IMPRESSION: Unremarkable bowel gas pattern; no free intra-abdominal air seen. Electronically Signed   By: Garald Balding M.D.   On: 04/12/2017 03:40    Assessment/Plan: Diagnosis: Functional and mobility deficits secondary to rhabdomyolysis/encephalopathy after suicide attempt 1. Does the need for close, 24 hr/day medical supervision in concert with the patient's rehab needs make it unreasonable for this patient to be served in a less intensive setting? Yes 2. Co-Morbidities requiring supervision/potential complications: bipolar and schizoaffective disrder, acute renal failure, htn 3. Due to bladder management, bowel management, safety, skin/wound care, disease management, medication administration, pain management and patient education, does the patient require 24 hr/day rehab nursing? Yes 4. Does the patient require coordinated care of  a physician, rehab nurse, PT (1-2 hrs/day, 5 days/week), OT (1-2 hrs/day, 5 days/week) and SLP (1-2 hrs/day, 5 days/week) to address physical and functional deficits in the context of the above medical diagnosis(es)? Yes Addressing deficits in the following areas: balance, endurance, locomotion, strength, transferring, bowel/bladder control, bathing, dressing, feeding, grooming, toileting, cognition, speech and psychosocial support 5. Can the patient actively participate in an intensive therapy program of at least 3 hrs of therapy per day at least 5 days per week? Yes 6. The potential for patient to make measurable gains while on inpatient rehab is good 7. Anticipated functional outcomes upon discharge from inpatient rehab are modified independent and supervision  with PT, modified independent and supervision with OT, modified independent and supervision with SLP. 8. Estimated rehab length of stay to reach the above functional goals is: potentially 10-15 days 9. Anticipated D/C setting: Home 10. Anticipated post D/C treatments: [facility] therapy 11. Overall Rehab/Functional Prognosis: good  RECOMMENDATIONS: This patient's condition is appropriate for continued rehabilitative care in the following setting: CIR Patient has agreed to participate in recommended program. Potentially Note that insurance prior authorization may be required for reimbursement for recommended care.  Comment: Rehab Admissions Coordinator to follow up.  Thanks,  Meredith Staggers, MD, Mellody Drown    Bary Leriche, PA-C 04/12/2017

## 2017-04-12 NOTE — Progress Notes (Signed)
Therapy continues to recommend CIR consult. Noted pt currently receiving hemodialysis. If pt would like to be considered for admit, Please place consult order. [phone]

## 2017-04-12 NOTE — Progress Notes (Signed)
Patient was transferred from 4NICU to 5W, alert and oriented x 4. LIJ placed for dialysis and also running fluids.  Patient had dialysis on MWF.  Patient has history of bipolar, schizophrenia, HTN, AKI.  Patient hears voices and is talking to those voices.  Psych consult is ordered.  Skin has popped blisters on back that resemble skin tears now and have foams placed over the area.  A prophylatic foam is on her sacrum.  Moisture associated skin damage in perineum area as well as moisture in the groin.

## 2017-04-12 NOTE — Progress Notes (Signed)
[facility] Progress Note  Subjective: HD yest, removed 2500 tolerated well- no UOP recorded- moved to less acute floor.  "I havent had any of my psych meds since I have been here"   Vitals:   04/12/17 0211 04/12/17 0436 04/12/17 0504 04/12/17 0810  BP: 127/75     Pulse:      Resp: 16     Temp: 98.1 F (36.7 C) 98 F (36.7 C) 98.2 F (36.8 C) 98.2 F (36.8 C)  TempSrc: Oral Oral Oral Oral  SpO2:      Weight:      Height:        Inpatient medications: . amLODipine  5 mg Oral Daily  . calcium acetate  667 mg Oral TID WC  . Chlorhexidine Gluconate Cloth  6 each Topical Q0600  . diclofenac sodium  4 g Topical QID  . heparin injection (subcutaneous)  5,000 Units Subcutaneous Q8H  . ranitidine  150 mg Oral BID  . vortioxetine HBr  20 mg Oral QHS   . dextrose 50 mL/hr at 04/12/17 0100   acetaminophen, fentaNYL (SUBLIMAZE) injection, hydrALAZINE, loperamide, ondansetron (ZOFRAN) IV  Exam: Gen obese young AAF,  no distress but mult c/o's Chest occ rhonchi, mostly clear RRR no MRG Abd soft ntnd no mass or ascites +bs obese GU foley cath draining small amts of clear brownish urine MS no joint effusions or deformity Ext 2-3+ diffuse edema R hip/ lateral thigh area, 1+ edema elsewhere LE's Neuro Ox 3, NF   Home meds: - abilify injection monthly / clozapine bid/ doxepin hs/ seroquel xr bid/ trintellix qd / eszopiclone prn - HCTZ 25 qd/ ecasa    UA - yellow, neg Hb/ LE/ nitrite, 5.0 , prot 30, 1.021, 0-5 wbc/ rbc ECHO - mild LVH, o/w normal study CXR 3/8 > Pulmonary vascular congestion may reflect mild fluid overload. Left lower lobe consolidation unchanged. UDS negative, etoh/ salicylate negative, HIV neg   Impression: 1  Acute renal failure - found down, AKI due to ATN from severe rhabdo-  still not making urine. Vol overloaded. SP HD x 2- 3/8 and 3/9, then again on 3/11 lytes stable. Monitor UOP and labs with PRN HD and await return of renal fxn- baseline  renal function in Feb 2018 was normal.  Most likely will need HD tomorrow especially if no UOP.  [name] now 38 days old - will likely keep in thru the week-- if needs dialysis past that may need tunneled cath   2  Severe depression/ schizoaffective disorder - on multiple psych meds- I would think could restart - per primary  3  Resp failure - resolved- still overloaded- UF as able  4  AMS - resolved 5  HTN - stable on norvasc 6. Anemia- iron stores low- will replete 7. Bones- check intact PTH- last phos 6.8, started phoslo      Reata Petrov A  04/12/2017, 11:40 AM   Recent Labs  Lab 04/06/17 0306  04/08/17 0436 04/09/17 0601 04/10/17 0423 04/11/17 0739 04/12/17 0835  NA 143   < > 141 137 135 128* 131*  K 4.0   < > 3.4* 3.5 3.7 3.7 4.1  CL 110   < > 103 97* 98* 92* 94*  CO2 22   < > 21* 24 25 23 25   GLUCOSE 123*   < > 125* 123* 139* 114* 109*  BUN 26*   < > 70* 54* 36* 55* 43*  CREATININE 2.94*   < > 7.49* 6.71*  5.45* 7.71* 6.84*  CALCIUM 7.5*   < > 6.8* 7.0* 7.3* 7.6* 8.0*  PHOS 4.6  --  7.5* 6.8*  --   --   --    < > = values in this interval not displayed.   Recent Labs  Lab 04/08/17 0436 04/09/17 0601 04/12/17 0835  AST 492* 423* 152*  ALT 240* 233* 158*  ALKPHOS 47 92 117  BILITOT 0.7 1.0 0.9  PROT 7.2 5.5* 5.5*  ALBUMIN 1.9* 2.0* 1.8*   Recent Labs  Lab 04/10/17 0423 04/11/17 0739 04/12/17 0835  WBC 16.9* 18.0* 20.9*  HGB 10.2* 9.8* 10.0*  HCT 30.0* 28.3* 29.6*  MCV 86.5 85.5 86.3  PLT 191 181 205   Iron/TIBC/Ferritin/ %Sat    Component Value Date/Time   IRON 31 04/12/2017 0835   TIBC 196 (L) 04/12/2017 0835   FERRITIN 302 04/12/2017 0835   IRONPCTSAT 16 04/12/2017 0835

## 2017-04-12 NOTE — Progress Notes (Signed)
Physical Therapy Treatment Patient Details Name: Jenna Hill MRN: [id number] DOB: 1971-01-04 Today's Date: 04/12/2017    History of Present Illness pt is a 47 y/o female with pmh significant for schizophrenia, HTN, bipolar d/o and attempted suicide admitted with AMS and hypothermia, suspected intentional OD of psych medication.  Treating for aspiration and sepsis.  Extubated 3/8.    PT Comments    Continuing work on functional mobility and activity tolerance;  Noting improved power up to sanding today, and able to incr amb distance modestly; one seated rest break; HR max 138  Continue to recommend comprehensive inpatient rehab (CIR) for post-acute therapy needs. Will place CIR screen.   Follow Up Recommendations  CIR     Equipment Recommendations  Rolling walker with 5" wheels;3in1 (PT)    Recommendations for Other Services Rehab consult     Precautions / Restrictions Precautions Precautions: Fall Precaution Comments: No pf/df at R Foot.    Mobility  Bed Mobility Overal bed mobility: Needs Assistance Bed Mobility: Supine to Sit     Supine to sit: Min assist     General bed mobility comments: cues for sequence of task,  assist to help RLE off of the bed; minimal handheld assist LE and trunk  Transfers Overall transfer level: Needs assistance Equipment used: Rolling walker (2 wheeled) Transfers: Sit to/from Stand Sit to Stand: Mod assist         General transfer comment: Mod assist to power up and steady at initial stand  Ambulation/Gait Ambulation/Gait assistance: Mod assist;+2 safety/equipment Ambulation Distance (Feet): 10 Feet(with one seated rest break) Assistive device: Rolling walker (2 wheeled) Gait Pattern/deviations: Steppage;Decreased stance time - right;Decreased step length - left(R)     General Gait Details: Used Ace wrap R foot for some dorsiflexion assist and for some extra stability R foot, as Jenna Hill described feeling like her Right ankle  was "rolling" last session; Painful in R stance    Stairs            Wheelchair Mobility    Modified Rankin (Stroke Patients Only)       Balance     Sitting balance-Leahy Scale: Fair       Standing balance-Leahy Scale: Poor Standing balance comment: reliant on the RW due to R foot instability                            Cognition Arousal/Alertness: Awake/alert Behavior During Therapy: WFL for tasks assessed/performed Overall Cognitive Status: Within Functional Limits for tasks assessed                                        Exercises      General Comments General comments (skin integrity, edema, etc.): Session conducted on Room Air, and O2 sats stable; tachy HR, getting up to 137 with amb; settles down to 110s with seated rest      Pertinent Vitals/Pain Pain Assessment: 0-10 Pain Score: 8 (after pain meds) Pain Location: R thigh and lower leg Pain Descriptors / Indicators: Aching;Grimacing Pain Intervention(s): Monitored during session;Patient requesting pain meds-RN notified;RN gave pain meds during session    Home Living                      Prior Function            PT Goals (current goals can  now be found in the care plan section) Acute Rehab PT Goals Patient Stated Goal: pt didn't state any specific goal, but wants to be able to live alone. PT Goal Formulation: With patient Time For Goal Achievement: 04/22/17 Potential to Achieve Goals: Good Progress towards PT goals: Progressing toward goals(slowly)    Frequency    Min 3X/week      PT Plan Current plan remains appropriate    Co-evaluation              AM-PAC PT "6 Clicks" Daily Activity  Outcome Measure  Difficulty turning over in bed (including adjusting bedclothes, sheets and blankets)?: A Lot Difficulty moving from lying on back to sitting on the side of the bed? : Unable Difficulty sitting down on and standing up from a chair with arms  (e.g., wheelchair, bedside commode, etc,.)?: Unable Help needed moving to and from a bed to chair (including a wheelchair)?: A Lot Help needed walking in hospital room?: A Lot Help needed climbing 3-5 steps with a railing? : A Lot 6 Click Score: 10    End of Session Equipment Utilized During Treatment: Gait belt Activity Tolerance: Patient tolerated treatment well Patient left: in chair;with call bell/phone within reach;with chair alarm set Nurse Communication: Mobility status PT Visit Diagnosis: Unsteadiness on feet (R26.81);Other abnormalities of gait and mobility (R26.89);Muscle weakness (generalized) (M62.81);Other symptoms and signs involving the nervous system (R29.898)     Time: 0933-1012(minus approx 5 minutes while RN gave meds) PT Time Calculation (min) (ACUTE ONLY): 39 min  Charges:  $Gait Training: 8-22 mins $Therapeutic Activity: 8-22 mins                    G Codes:       Jenna Hill, PT  Acute Rehabilitation Services Pager [phone] Office [phone]    Jenna Hill 04/12/2017, 11:46 AM

## 2017-04-13 ENCOUNTER — Inpatient Hospital Stay (HOSPITAL_COMMUNITY): Payer: Medicaid Other

## 2017-04-13 DIAGNOSIS — Z818 Family history of other mental and behavioral disorders: Secondary | ICD-10-CM

## 2017-04-13 DIAGNOSIS — F251 Schizoaffective disorder, depressive type: Secondary | ICD-10-CM

## 2017-04-13 DIAGNOSIS — Z62810 Personal history of physical and sexual abuse in childhood: Secondary | ICD-10-CM

## 2017-04-13 DIAGNOSIS — G4700 Insomnia, unspecified: Secondary | ICD-10-CM

## 2017-04-13 DIAGNOSIS — K7200 Acute and subacute hepatic failure without coma: Secondary | ICD-10-CM

## 2017-04-13 LAB — RENAL FUNCTION PANEL
Albumin: 1.7 g/dL — ABNORMAL LOW (ref 3.5–5.0)
Anion gap: 13 (ref 5–15)
BUN: 56 mg/dL — ABNORMAL HIGH (ref 6–20)
CO2: 23 mmol/L (ref 22–32)
Calcium: 8.1 mg/dL — ABNORMAL LOW (ref 8.9–10.3)
Chloride: 92 mmol/L — ABNORMAL LOW (ref 101–111)
Creatinine, Ser: 8.17 mg/dL — ABNORMAL HIGH (ref 0.44–1.00)
GFR calc Af Amer: 6 mL/min — ABNORMAL LOW
GFR calc non Af Amer: 5 mL/min — ABNORMAL LOW
Glucose, Bld: 111 mg/dL — ABNORMAL HIGH (ref 65–99)
Phosphorus: 6.2 mg/dL — ABNORMAL HIGH (ref 2.5–4.6)
Potassium: 3.9 mmol/L (ref 3.5–5.1)
Sodium: 128 mmol/L — ABNORMAL LOW (ref 135–145)

## 2017-04-13 LAB — DIFFERENTIAL
Basophils Absolute: 0 K/uL (ref 0.0–0.1)
Basophils Relative: 0 %
Eosinophils Absolute: 0.2 K/uL (ref 0.0–0.7)
Eosinophils Relative: 1 %
Lymphocytes Relative: 10 %
Lymphs Abs: 2.1 K/uL (ref 0.7–4.0)
Monocytes Absolute: 3.1 K/uL — ABNORMAL HIGH (ref 0.1–1.0)
Monocytes Relative: 15 %
Neutro Abs: 15.5 K/uL — ABNORMAL HIGH (ref 1.7–7.7)
Neutrophils Relative %: 74 %

## 2017-04-13 LAB — BLOOD GAS, VENOUS
ACID-BASE DEFICIT: 10 mmol/L — AB (ref 0.0–2.0)
BICARBONATE: 16.9 mmol/L — AB (ref 20.0–28.0)
O2 Saturation: 54 %
PH VEN: 7.316 (ref 7.250–7.430)
PO2 VEN: 24.6 mmHg — AB (ref 32.0–45.0)
Patient temperature: 88
pCO2, Ven: 30.9 mmHg — ABNORMAL LOW (ref 44.0–60.0)

## 2017-04-13 LAB — CBC WITH DIFFERENTIAL/PLATELET
Band Neutrophils: 0 %
Basophils Absolute: 0 K/uL (ref 0.0–0.1)
Basophils Relative: 0 %
Blasts: 0 %
Eosinophils Absolute: 0 K/uL (ref 0.0–0.7)
Eosinophils Relative: 0 %
HCT: 22.2 % — ABNORMAL LOW (ref 36.0–46.0)
Hemoglobin: 7.6 g/dL — ABNORMAL LOW (ref 12.0–15.0)
Lymphocytes Relative: 16 %
Lymphs Abs: 2.8 K/uL (ref 0.7–4.0)
MCH: 29.6 pg (ref 26.0–34.0)
MCHC: 34.2 g/dL (ref 30.0–36.0)
MCV: 86.4 fL (ref 78.0–100.0)
Metamyelocytes Relative: 0 %
Monocytes Absolute: 1 K/uL (ref 0.1–1.0)
Monocytes Relative: 6 %
Myelocytes: 0 %
Neutro Abs: 13.6 K/uL — ABNORMAL HIGH (ref 1.7–7.7)
Neutrophils Relative %: 78 %
Platelets: 152 K/uL (ref 150–400)
Promyelocytes Absolute: 0 %
RBC: 2.57 MIL/uL — ABNORMAL LOW (ref 3.87–5.11)
RDW: 13.8 % (ref 11.5–15.5)
WBC: 17.4 K/uL — ABNORMAL HIGH (ref 4.0–10.5)
nRBC: 0 /100{WBCs}

## 2017-04-13 LAB — HEMOGLOBIN AND HEMATOCRIT, BLOOD
HCT: 27.9 % — ABNORMAL LOW (ref 36.0–46.0)
Hemoglobin: 9.5 g/dL — ABNORMAL LOW (ref 12.0–15.0)

## 2017-04-13 LAB — TYPE AND SCREEN
ABO/RH(D): O POS
Antibody Screen: NEGATIVE

## 2017-04-13 LAB — PARATHYROID HORMONE, INTACT (NO CA): PTH: 132 pg/mL — ABNORMAL HIGH (ref 15–65)

## 2017-04-13 LAB — CBC
HEMATOCRIT: 28.5 % — AB (ref 36.0–46.0)
Hemoglobin: 9.8 g/dL — ABNORMAL LOW (ref 12.0–15.0)
MCH: 29.4 pg (ref 26.0–34.0)
MCHC: 34.4 g/dL (ref 30.0–36.0)
MCV: 85.6 fL (ref 78.0–100.0)
Platelets: 228 10*3/uL (ref 150–400)
RBC: 3.33 MIL/uL — ABNORMAL LOW (ref 3.87–5.11)
RDW: 13.7 % (ref 11.5–15.5)
WBC: 24.3 10*3/uL — AB (ref 4.0–10.5)

## 2017-04-13 LAB — URINE CULTURE

## 2017-04-13 LAB — ABO/RH: ABO/RH(D): O POS

## 2017-04-13 LAB — PATHOLOGIST SMEAR REVIEW

## 2017-04-13 MED ORDER — ACETAMINOPHEN 325 MG PO TABS
ORAL_TABLET | ORAL | Status: AC
  Filled 2017-04-13: qty 2

## 2017-04-13 MED ORDER — LORAZEPAM 2 MG/ML IJ SOLN
INTRAMUSCULAR | Status: AC
  Administered 2017-04-13: 1 mg via INTRAVENOUS
  Filled 2017-04-13: qty 1

## 2017-04-13 MED ORDER — CEFAZOLIN SODIUM-DEXTROSE 1-4 GM/50ML-% IV SOLN
1.0000 g | INTRAVENOUS | Status: AC
  Administered 2017-04-13 – 2017-04-19 (×7): 1 g via INTRAVENOUS
  Filled 2017-04-13 (×7): qty 50

## 2017-04-13 MED ORDER — LORAZEPAM 2 MG/ML IJ SOLN
1.0000 mg | Freq: Once | INTRAMUSCULAR | Status: AC
  Administered 2017-04-13: 1 mg via INTRAVENOUS

## 2017-04-13 MED ORDER — CLOZAPINE 25 MG PO TABS
25.0000 mg | ORAL_TABLET | Freq: Every day | ORAL | Status: DC
  Administered 2017-04-13 – 2017-04-14 (×2): 25 mg via ORAL
  Filled 2017-04-13 (×3): qty 1

## 2017-04-13 MED ORDER — IOPAMIDOL (ISOVUE-300) INJECTION 61%
INTRAVENOUS | Status: AC
  Filled 2017-04-13: qty 30

## 2017-04-13 NOTE — Progress Notes (Signed)
Physical Therapy Treatment Patient Details Name: Jenna Hill MRN: [id number] DOB: [birth year] Today's Date: 04/13/2017    History of Present Illness pt is a 47 y/o female with pmh significant for schizophrenia, HTN, bipolar d/o and attempted suicide admitted with AMS and hypothermia, suspected intentional OD of psych medication.  Treating for aspiration and sepsis.  Extubated 3/8.    PT Comments    Pt asleep on arrival to room. Took several attempts to rouse, with pt drifting off during session. Session currently limited by lethargy. She required mod A for all mobilities. Very unsteady with gait secondary to R foot lacking DF/PF. Pt may benefit from foot drop AFO for increased stability. Will continue to follow acutely to maximize pt's safety with mobility and functional independence.    Follow Up Recommendations  CIR     Equipment Recommendations  Rolling walker with 5" wheels;3in1 (PT)    Recommendations for Other Services Rehab consult     Precautions / Restrictions Precautions Precautions: Fall Precaution Comments: No pf/df at R Foot. Restrictions Weight Bearing Restrictions: No    Mobility  Bed Mobility Overal bed mobility: Needs Assistance Bed Mobility: Supine to Sit     Supine to sit: Mod assist     General bed mobility comments: Mod A to progress LEs and elevate trunk to come to EOB. Cues for sequencing and technique  Transfers Overall transfer level: Needs assistance Equipment used: Rolling walker (2 wheeled) Transfers: Sit to/from Stand Sit to Stand: Mod assist         General transfer comment: mod A to power up into standing and to steady.   Ambulation/Gait Ambulation/Gait assistance: Mod assist;+2 safety/equipment Ambulation Distance (Feet): 3 Feet Assistive device: Rolling walker (2 wheeled) Gait Pattern/deviations: Steppage;Decreased stance time - right;Decreased step length - left Gait velocity: decreased Gait velocity interpretation: Below  normal speed for age/gender General Gait Details: Pt very unsteady with gait this AM. Would benefit from a foot drop AFO to increase stability on R ankle. Mod A to steady with chair close behind. Pt with sudden need to sit.   Stairs            Wheelchair Mobility    Modified Rankin (Stroke Patients Only)       Balance Overall balance assessment: Needs assistance Sitting-balance support: No upper extremity supported;Feet supported Sitting balance-Leahy Scale: Fair Sitting balance - Comments: able to move out of midline/BOS, but guarded   Standing balance support: Bilateral upper extremity supported Standing balance-Leahy Scale: Poor Standing balance comment: reliant on the RW and external support due to R foot instability                            Cognition Arousal/Alertness: Lethargic Behavior During Therapy: Flat affect Overall Cognitive Status: Within Functional Limits for tasks assessed                                 General Comments: Lethargic and mumbling during session.      Exercises      General Comments General comments (skin integrity, edema, etc.): Tachy HR remaining in mid 120s throughout activity      Pertinent Vitals/Pain Pain Assessment: No/denies pain    Home Living                      Prior Function  PT Goals (current goals can now be found in the care plan section) Acute Rehab PT Goals Patient Stated Goal: pt didn't state any specific goal, but wants to be able to live alone. PT Goal Formulation: With patient Time For Goal Achievement: 04/22/17 Potential to Achieve Goals: Good Progress towards PT goals: Progressing toward goals(slowly)    Frequency    Min 3X/week      PT Plan Current plan remains appropriate    Co-evaluation              AM-PAC PT "6 Clicks" Daily Activity  Outcome Measure  Difficulty turning over in bed (including adjusting bedclothes, sheets and  blankets)?: Unable Difficulty moving from lying on back to sitting on the side of the bed? : Unable Difficulty sitting down on and standing up from a chair with arms (e.g., wheelchair, bedside commode, etc,.)?: Unable Help needed moving to and from a bed to chair (including a wheelchair)?: A Lot Help needed walking in hospital room?: A Lot Help needed climbing 3-5 steps with a railing? : Total 6 Click Score: 8    End of Session Equipment Utilized During Treatment: Gait belt Activity Tolerance: Patient tolerated treatment well Patient left: in chair;with call bell/phone within reach;with chair alarm set Nurse Communication: Mobility status PT Visit Diagnosis: Unsteadiness on feet (R26.81);Other abnormalities of gait and mobility (R26.89);Muscle weakness (generalized) (M62.81);Other symptoms and signs involving the nervous system (R29.898)     Time: 0936-1000 PT Time Calculation (min) (ACUTE ONLY): 24 min  Charges:  $Gait Training: 8-22 mins $Therapeutic Activity: 8-22 mins                    G Codes:       Benjiman Core, [place] Pager [phone] Acute Rehab   Allena Katz 04/13/2017, 10:35 AM

## 2017-04-13 NOTE — Progress Notes (Signed)
Patient going to dialysis. Alert and oriented; no complaints, no acute distress noted.

## 2017-04-13 NOTE — Progress Notes (Addendum)
PROGRESS NOTE    Jenna Hill  FBP:[id number] DOB: [birth year] DOA: 04/05/2017 PCP: Alfonse Spruce, FNP    Brief Narrative by Dr Wynelle Cleveland: " Jenna Hill is a 47 y/o F with h/o bipolar disorder, schizophrenia and HTN who presented the the hospital from home on 3/5 obtunded with hypothermia & tachycardia. Narcan did not help. She was intubated for airway protection. Vomited during intubation. Lactic acid 10.54,  K 2.7, BUN 21, Cr 2.01 CK > 50,000, Lipase 207, ASH 349, ALD 184 CXR >  Vascular congestion. Received 4 days of Unasyn  on admission for possible aspiration Received aggressive IV hydration but renal function continued to worsen and nephrology was consulted.  Extubated on 3/8. Dialysis also started same day.   Patient still requiring HD, she has persistent leukocytosis and vomiting.    Assessment & Plan:   Active Problems:   Hypertension   Schizoaffective disorder, depressive type ([place])   Acute encephalopathy   Acute respiratory failure (HCC)   Shock liver   Rhabdomyolysis   Bipolar 1 disorder (HCC)  1-Acute encephalopathy;  Bipolar, schizoaffective disorder.  ? Medication overdose. Psych consulted.  CT head negative,  APAP and salicylate negative.  UDS negative.  Trintellix resume. Her psychiatric medications; clozapine, Seroquel, doxepin were resume 3-12.  Psych consulted , patient was hearing voices, and to help adjust medications.  Discussed with Dr Elmore Guise; will resume clozapine to 25 mg them tomorrow 50 mg them increment dose 100--200--300-400, to avoid myocarditis.    Tachycardia; called  by nurse patient HR has been in the 130 since she came from HD. Patient asymptomatic, no chest pain  or dyspnea. Will repeat hb. No fever per nurse.  If HR continue to be elevated might need PRN med.   2-Acute respiratory Failure;  intubated on admission and extubated on 3/8 - resp virus panel negative - resp culture> no growth - LLL consolidation on CXR -treated with  Unasyn.   3-Rhabdomyolysis- severe swelling of right leg CK improving.  Now reuiring HD>  Discussed with Dr Moshe Cipro, will discontinue IV fluids.  - CT  On 3/9: Moderate subcutaneous edema and fat stranding about the right lateral hip and circumferentially through the thigh, nonspecific, but can be seen with cellulitis. No drainable fluid collection. -  venous duplex of Right leg is negative No redness right lower extremity.  CK 5,000.   AKI; secondary to Rhabdomyolysis.  Requiring HD.  Plan for HD today, per nephrologist.   Transaminases; shock liver vs rhabdo.  Follow enzymes.  Hepatitis b negative.   Leukocytosis. Patient with worsening leukocytosis. Ct hip with edema, consider cellulitis. Will start ancef. Also patient with vomiting, transaminases, will check CT abdomen and add ancef.   Anemia; hb range 10--9.  Hb this am at 7.6, repeated at 9.  No evidence of active bleeding.   DVT prophylaxis: Heparin  Code Status: full code.  Family Communication: care discussed with patient.  Disposition Plan: CIR, when leukocytosis improved, check CT abdomen.   Consultants:   Nephrology  CCM    Procedures:   Intubation-Extubation    Antimicrobials: Ampicillin received for 4 days.  Ancef 3-13  Subjective: She is still having intermittent vomiting.  Denies abdominal pain. No BM in days,. Report passing gas,.  denies chest pian.  Right leg pain , is not worse.   Objective: Vitals:   04/13/17 0820 04/13/17 0906 04/13/17 1153 04/13/17 1214  BP: (!) 164/94 (!) 150/87 (!) 149/85   Pulse: (!) 128 (!) 117 (!) 120  Resp: 20 (!) 21 20   Temp: 98.5 F (36.9 C)  98.5 F (36.9 C) (!) 97.5 F (36.4 C)  TempSrc: Oral  Oral Oral  SpO2: 100% 95% 100%   Weight:      Height:        Intake/Output Summary (Last 24 hours) at 04/13/2017 1325 Last data filed at 04/13/2017 1140 Gross per 24 hour  Intake 2406.17 ml  Output 50 ml  Net 2356.17 ml   Filed Weights    04/11/17 0556 04/12/17 0205 04/13/17 0421  Weight: 126.1 kg (278 lb) 124.2 kg (273 lb 13 oz) 125.3 kg (276 lb 3.8 oz)    Examination:  General exam: Appears calm and comfortable  Respiratory system: crackles bases.  Respiratory effort normal. Cardiovascular system: S1 & S2 heard, RRR. No JVD, murmurs, rubs, gallops or clicks. No pedal edema. Gastrointestinal system: Abdomen is distended, soft and nontender. No organomegaly or masses felt. Normal bowel sounds heard.  Central nervous system: Alert and oriented. No focal neurological deficits. slowly answer questions.  Extremities: Symmetric 5 x 5 power. Skin: No rashes, lesions or ulcers, no redness LE.  Psychiatry: Judgement and insight appear normal. Mood & affect appropriate.     Data Reviewed: I have personally reviewed following labs and imaging studies  CBC: Recent Labs  Lab 04/10/17 0423 04/11/17 0739 04/12/17 0835 04/13/17 0501 04/13/17 1035  WBC 16.9* 18.0* 20.9* 17.4* 24.3*  NEUTROABS  --   --  15.5* 13.6*  --   HGB 10.2* 9.8* 10.0* 7.6* 9.8*  HCT 30.0* 28.3* 29.6* 22.2* 28.5*  MCV 86.5 85.5 86.3 86.4 85.6  PLT 191 181 205 152 253   Basic Metabolic Panel: Recent Labs  Lab 04/08/17 0436 04/09/17 0601 04/10/17 0423 04/11/17 0739 04/12/17 0835 04/13/17 0501  NA 141 137 135 128* 131* 128*  K 3.4* 3.5 3.7 3.7 4.1 3.9  CL 103 97* 98* 92* 94* 92*  CO2 21* 24 25 23 25 23   GLUCOSE 125* 123* 139* 114* 109* 111*  BUN 70* 54* 36* 55* 43* 56*  CREATININE 7.49* 6.71* 5.45* 7.71* 6.84* 8.17*  CALCIUM 6.8* 7.0* 7.3* 7.6* 8.0* 8.1*  MG 2.2 2.0  --   --   --   --   PHOS 7.5* 6.8*  --   --   --  6.2*   GFR: Estimated Creatinine Clearance: 11.7 mL/min (A) (by C-G formula based on SCr of 8.17 mg/dL (H)). Liver Function Tests: Recent Labs  Lab 04/07/17 0247 04/08/17 0436 04/09/17 0601 04/12/17 0835 04/13/17 0501  AST 733* 492* 423* 152*  --   ALT 311* 240* 233* 158*  --   ALKPHOS 35* 47 92 117  --   BILITOT 0.7  0.7 1.0 0.9  --   PROT 5.2* 7.2 5.5* 5.5*  --   ALBUMIN 2.2* 1.9* 2.0* 1.8* 1.7*   No results for input(s): LIPASE, AMYLASE in the last 168 hours. No results for input(s): AMMONIA in the last 168 hours. Coagulation Profile: No results for input(s): INR, PROTIME in the last 168 hours. Cardiac Enzymes: Recent Labs  Lab 04/07/17 0247 04/09/17 0601 04/12/17 0835  CKTOTAL >50,000* 37,372* 4,558*   BNP (last 3 results) No results for input(s): PROBNP in the last 8760 hours. HbA1C: No results for input(s): HGBA1C in the last 72 hours. CBG: Recent Labs  Lab 04/10/17 1957 04/10/17 2307 04/11/17 0352 04/11/17 0815 04/11/17 1129  GLUCAP 98 96 92 91 90   Lipid Profile: No results for input(s): CHOL, HDL,  LDLCALC, TRIG, CHOLHDL, LDLDIRECT in the last 72 hours. Thyroid Function Tests: No results for input(s): TSH, T4TOTAL, FREET4, T3FREE, THYROIDAB in the last 72 hours. Anemia Panel: Recent Labs    04/12/17 0835  FERRITIN 302  TIBC 196*  IRON 31   Sepsis Labs: No results for input(s): PROCALCITON, LATICACIDVEN in the last 168 hours.  Recent Results (from the past 240 hour(s))  Respiratory Panel by PCR     Status: None   Collection Time: 04/05/17  4:01 PM  Result Value Ref Range Status   Adenovirus NOT DETECTED NOT DETECTED Final   Coronavirus 229E NOT DETECTED NOT DETECTED Final   Coronavirus HKU1 NOT DETECTED NOT DETECTED Final   Coronavirus NL63 NOT DETECTED NOT DETECTED Final   Coronavirus OC43 NOT DETECTED NOT DETECTED Final   Metapneumovirus NOT DETECTED NOT DETECTED Final   Rhinovirus / Enterovirus NOT DETECTED NOT DETECTED Final   Influenza A NOT DETECTED NOT DETECTED Final   Influenza B NOT DETECTED NOT DETECTED Final   Parainfluenza Virus 1 NOT DETECTED NOT DETECTED Final   Parainfluenza Virus 2 NOT DETECTED NOT DETECTED Final   Parainfluenza Virus 3 NOT DETECTED NOT DETECTED Final   Parainfluenza Virus 4 NOT DETECTED NOT DETECTED Final   Respiratory Syncytial  Virus NOT DETECTED NOT DETECTED Final   Bordetella pertussis NOT DETECTED NOT DETECTED Final   Chlamydophila pneumoniae NOT DETECTED NOT DETECTED Final   Mycoplasma pneumoniae NOT DETECTED NOT DETECTED Final    Comment: Performed at [facility], [address] [place], [place] [address]  Culture, blood (routine x 2)     Status: None   Collection Time: 04/05/17  5:51 PM  Result Value Ref Range Status   Specimen Description   Final    BLOOD RIGHT HAND Performed at [facility] [address], [place], [place] [address]    Special Requests   Final    IN PEDIATRIC BOTTLE Blood Culture adequate volume Performed at [facility] [address], [place], [place] [address]    Culture   Final    NO GROWTH 5 DAYS Performed at [facility], [place] [address], [place], [place] [address]    Report Status 04/10/2017 FINAL  Final  Culture, blood (routine x 2)     Status: None   Collection Time: 04/05/17  5:51 PM  Result Value Ref Range Status   Specimen Description   Final    BLOOD RIGHT HAND Performed at [facility] [address] [place], [place] [address]    Special Requests   Final    IN PEDIATRIC BOTTLE Blood Culture adequate volume Performed at [facility] [address], [place], [place] [address]    Culture   Final    NO GROWTH 5 DAYS Performed at [facility], [place] [address], [place], [place] [address]    Report Status 04/10/2017 FINAL  Final  MRSA PCR Screening     Status: None   Collection Time: 04/05/17  6:13 PM  Result Value Ref Range Status   MRSA by PCR NEGATIVE NEGATIVE Final    Comment:        The GeneXpert MRSA Assay (FDA approved for NASAL specimens only), is one component of a comprehensive MRSA colonization surveillance program. It is not intended to diagnose MRSA infection nor to guide or monitor treatment for MRSA infections. Performed at [facility], [place] [address] [place],  [address]  Culture, Urine     Status: Abnormal   Collection Time: 04/11/17 10:49 PM  Result Value Ref Range Status   Specimen Description URINE, CLEAN CATCH  Final   Special Requests NONE  Final   Culture (A)  Final    <10,000 COLONIES/mL INSIGNIFICANT GROWTH Performed at [facility], [address] [place], [place] [address]    Report Status 04/13/2017 FINAL  Final         Radiology Studies: Dg Abd Portable 1v  Result Date: 04/12/2017 CLINICAL DATA:  Acute onset of nausea and vomiting. Generalized abdominal distention. EXAM: PORTABLE ABDOMEN - 1 VIEW COMPARISON:  Renal ultrasound performed 04/07/2017 FINDINGS: The visualized bowel gas pattern is unremarkable. Scattered air and stool filled loops of colon are seen; no abnormal dilatation of small bowel loops is seen to suggest small bowel obstruction. No free intra-abdominal air is identified, though evaluation for free air is limited on supine views. The visualized osseous structures are within normal limits; the sacroiliac joints are unremarkable in appearance. The visualized lung bases are essentially clear. IMPRESSION: Unremarkable bowel gas pattern; no free intra-abdominal air seen. Electronically Signed   By: Garald Balding M.D.   On: 04/12/2017 03:40        Scheduled Meds: . amLODipine  5 mg Oral Daily  . calcium acetate  667 mg Oral TID WC  . Chlorhexidine Gluconate Cloth  6 each Topical Q0600  . cloZAPine  400 mg Oral QHS  . diclofenac sodium  4 g Topical QID  . heparin injection (subcutaneous)  5,000 Units Subcutaneous Q8H  . QUEtiapine  400 mg Oral QHS  . ranitidine  150 mg Oral BID  . vortioxetine HBr  20 mg Oral QHS   Continuous Infusions: . ferric gluconate (FERRLECIT/NULECIT) IV Stopped (04/13/17 1215)     LOS: 8 days    Time spent: 35 minutes.     Elmarie Shiley, MD [facility] Pager [phone]  If 7PM-7AM, please contact  night-coverage [email or web address] Password [email or web address] 04/13/2017, 1:25 PM

## 2017-04-13 NOTE — Progress Notes (Signed)
Patient back from dialysis. No acute distress noted at this time. Will continue to monitor.

## 2017-04-13 NOTE — Procedures (Signed)
Patient was seen on dialysis and the procedure was supervised.  BFR 400  Via vascath BP is  114/80.   Patient appears to be tolerating treatment well- c/o pan but this is not new for her- big goal and now tachy- will watch   Jenna Hill A 04/13/2017

## 2017-04-13 NOTE — Progress Notes (Signed)
Since patient came back from dialysis her HR is in 130 s. MD made aware. Will continue to monitor.

## 2017-04-13 NOTE — Progress Notes (Signed)
OT Cancellation Note  Patient Details Name: Jenna Hill MRN: [id number] DOB: 05-10-1970   Cancelled Treatment:    Reason Eval/Treat Not Completed: Patient at procedure or test/ unavailable(HD). Will follow.  Malka So 04/13/2017, 1:02 PM

## 2017-04-13 NOTE — Progress Notes (Signed)
Pharmacy Antibiotic Note  Jenna Hill is a 47 y.o. female admitted on 04/05/2017 with rhabdomyelitis.  Pt was found down, thought secondary to drug overdose.  Pt continues to have swelling to R hip and now WBC trending up.  Pharmacy has been consulted for Ancef dosing for cellulitis.  Also of note, pt with AKI, requiring intermittent HD.  HD planned for 3/13.  Plan: Ancef 1gm IV q24h - give after HD on dialysis days.  Height: 5' 6.5" (168.9 cm) Weight: 276 lb 3.8 oz (125.3 kg) IBW/kg (Calculated) : 60.45  Temp (24hrs), Avg:98.4 F (36.9 C), Min:97.5 F (36.4 C), Max:98.8 F (37.1 C)  Recent Labs  Lab 04/09/17 0601 04/10/17 0423 04/11/17 0739 04/12/17 0835 04/13/17 0501 04/13/17 1035  WBC 16.2* 16.9* 18.0* 20.9* 17.4* 24.3*  CREATININE 6.71* 5.45* 7.71* 6.84* 8.17*  --     Estimated Creatinine Clearance: 11.7 mL/min (A) (by C-G formula based on SCr of 8.17 mg/dL (H)).    Allergies  Allergen Reactions  . Bee Venom Anaphylaxis  . Bretylium     Other reaction(s): Seizures  . Codeine Anaphylaxis  . Other Anaphylaxis    bertrillium or bertillium    . Toradol [Ketorolac Tromethamine] Swelling  . Ibuprofen     Raised hives   . Latex Rash    Antimicrobials this admission: Unasyn 3/10 >> 3/15  Ancef  3/13 >>   Dose adjustments this admission:   Microbiology results: 3/12 BCx: ngtd 3/11 UCx: insig growth  3/5 MRSA PCR: negative  Thank you for allowing pharmacy to be a part of this patient's care.  [employer], Pharm.D., BCPS Clinical Pharmacist Pager: [phone] Clinical phone for 04/13/2017 from 8:30-4:00 is [phone]. After 4pm, please call Main Rx [phone]) for assistance. 04/13/2017 2:00 PM
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
## 2017-04-13 NOTE — Progress Notes (Signed)
[facility] Progress Note  Subjective: Min UOP recorded- labs worse-  Looks like they are evaluating for rehab  Vitals:   04/13/17 0449 04/13/17 0450 04/13/17 0820 04/13/17 0906  BP:   (!) 164/94 (!) 150/87  Pulse: (!) 120 (!) 121 (!) 128 (!) 117  Resp: (!) 22 (!) 22 20 (!) 21  Temp:   98.5 F (36.9 C)   TempSrc:   Oral   SpO2: 98% 98% 100% 95%  Weight:      Height:        Inpatient medications: . amLODipine  5 mg Oral Daily  . calcium acetate  667 mg Oral TID WC  . Chlorhexidine Gluconate Cloth  6 each Topical Q0600  . cloZAPine  400 mg Oral QHS  . diclofenac sodium  4 g Topical QID  . heparin injection (subcutaneous)  5,000 Units Subcutaneous Q8H  . QUEtiapine  400 mg Oral QHS  . ranitidine  150 mg Oral BID  . vortioxetine HBr  20 mg Oral QHS   . dextrose 1,000 mL (04/13/17 0903)  . ferric gluconate (FERRLECIT/NULECIT) IV Stopped (04/13/17 1215)   acetaminophen, fentaNYL (SUBLIMAZE) injection, hydrALAZINE, ondansetron (ZOFRAN) IV  Exam: Gen obese young AAF,  no distress but mult c/o's Chest occ rhonchi, mostly clear RRR no MRG Abd soft ntnd no mass or ascites +bs obese GU foley cath draining small amts of clear brownish urine MS no joint effusions or deformity Ext 2-3+ diffuse edema R hip/ lateral thigh area, 1+ edema elsewhere LE's Neuro Ox 3, NF   Home meds: - abilify injection monthly / clozapine bid/ doxepin hs/ seroquel xr bid/ trintellix qd / eszopiclone prn - HCTZ 25 qd/ ecasa    UA - yellow, neg Hb/ LE/ nitrite, 5.0 , prot 30, 1.021, 0-5 wbc/ rbc ECHO - mild LVH, o/w normal study CXR 3/8 > Pulmonary vascular congestion may reflect mild fluid overload. Left lower lobe consolidation unchanged. UDS negative, etoh/ salicylate negative, HIV neg   Impression: 1  Acute renal failure - found down, AKI due to ATN from severe rhabdo-  still not making much urine. Vol overloaded. SP HD x 2- 3/8 and 3/9, then again on 3/11 lytes stable. Monitor  UOP and labs with PRN HD and await return of renal fxn- baseline renal function in Feb 2018 was normal.  needs HD today  Jenna Hill now 44 days old - will likely keep in thru the week-- if needs dialysis past that will need tunneled cath   2  Severe depression/ schizoaffective disorder - on multiple psych meds-  restarted - per primary  3  Resp failure - resolved- still overloaded- UF as able  4  AMS - resolved 5  HTN - stable on norvasc 6. Anemia- iron stores low- will replete 7. Bones- check intact PTH- last phos 6.8, started phoslo - PTH 132- no meds needed      Jenna Hill A  04/13/2017, 11:21 AM   Recent Labs  Lab 04/08/17 0436 04/09/17 0601  04/11/17 0739 04/12/17 0835 04/13/17 0501  NA 141 137   < > 128* 131* 128*  K 3.4* 3.5   < > 3.7 4.1 3.9  CL 103 97*   < > 92* 94* 92*  CO2 21* 24   < > 23 25 23   GLUCOSE 125* 123*   < > 114* 109* 111*  BUN 70* 54*   < > 55* 43* 56*  CREATININE 7.49* 6.71*   < > 7.71* 6.84* 8.17*  CALCIUM  6.8* 7.0*   < > 7.6* 8.0* 8.1*  PHOS 7.5* 6.8*  --   --   --  6.2*   < > = values in this interval not displayed.   Recent Labs  Lab 04/08/17 0436 04/09/17 0601 04/12/17 0835 04/13/17 0501  AST 492* 423* 152*  --   ALT 240* 233* 158*  --   ALKPHOS 47 92 117  --   BILITOT 0.7 1.0 0.9  --   PROT 7.2 5.5* 5.5*  --   ALBUMIN 1.9* 2.0* 1.8* 1.7*   Recent Labs  Lab 04/12/17 0835 04/13/17 0501 04/13/17 1035  WBC 20.9* 17.4* 24.3*  NEUTROABS 15.5* 13.6*  --   HGB 10.0* 7.6* 9.8*  HCT 29.6* 22.2* 28.5*  MCV 86.3 86.4 85.6  PLT 205 152 228   Iron/TIBC/Ferritin/ %Sat    Component Value Date/Time   IRON 31 04/12/2017 0835   TIBC 196 (L) 04/12/2017 0835   FERRITIN 302 04/12/2017 0835   IRONPCTSAT 16 04/12/2017 0835

## 2017-04-13 NOTE — Progress Notes (Signed)
Patient currently not in room. I will follow up with patient once available. [phone]

## 2017-04-14 LAB — BASIC METABOLIC PANEL
Anion gap: 11 (ref 5–15)
BUN: 30 mg/dL — ABNORMAL HIGH (ref 6–20)
CALCIUM: 7.7 mg/dL — AB (ref 8.9–10.3)
CO2: 25 mmol/L (ref 22–32)
CREATININE: 5.82 mg/dL — AB (ref 0.44–1.00)
Chloride: 94 mmol/L — ABNORMAL LOW (ref 101–111)
GFR calc non Af Amer: 8 mL/min — ABNORMAL LOW (ref >60)
GFR, EST AFRICAN AMERICAN: 9 mL/min — AB (ref >60)
Glucose, Bld: 98 mg/dL (ref 65–99)
Potassium: 4.1 mmol/L (ref 3.5–5.1)
Sodium: 130 mmol/L — ABNORMAL LOW (ref 135–145)

## 2017-04-14 LAB — HEPATIC FUNCTION PANEL
ALT: 122 U/L — AB (ref 14–54)
AST: 102 U/L — ABNORMAL HIGH (ref 15–41)
Albumin: 1.8 g/dL — ABNORMAL LOW (ref 3.5–5.0)
Alkaline Phosphatase: 111 U/L (ref 38–126)
BILIRUBIN DIRECT: 0.2 mg/dL (ref 0.1–0.5)
BILIRUBIN INDIRECT: 0.4 mg/dL (ref 0.3–0.9)
Total Bilirubin: 0.6 mg/dL (ref 0.3–1.2)
Total Protein: 5.2 g/dL — ABNORMAL LOW (ref 6.5–8.1)

## 2017-04-14 LAB — CBC
HCT: 26.6 % — ABNORMAL LOW (ref 36.0–46.0)
Hemoglobin: 8.8 g/dL — ABNORMAL LOW (ref 12.0–15.0)
MCH: 28.6 pg (ref 26.0–34.0)
MCHC: 33.1 g/dL (ref 30.0–36.0)
MCV: 86.4 fL (ref 78.0–100.0)
PLATELETS: 243 10*3/uL (ref 150–400)
RBC: 3.08 MIL/uL — AB (ref 3.87–5.11)
RDW: 13.6 % (ref 11.5–15.5)
WBC: 23.8 10*3/uL — ABNORMAL HIGH (ref 4.0–10.5)

## 2017-04-14 LAB — LIPASE, BLOOD: LIPASE: 30 U/L (ref 11–51)

## 2017-04-14 LAB — CK: Total CK: 3827 U/L — ABNORMAL HIGH (ref 38–234)

## 2017-04-14 MED ORDER — CLOZAPINE 25 MG PO TABS
50.0000 mg | ORAL_TABLET | Freq: Every day | ORAL | Status: AC
  Administered 2017-04-15: 50 mg via ORAL
  Filled 2017-04-14: qty 2

## 2017-04-14 MED ORDER — CLOZAPINE 100 MG PO TABS
200.0000 mg | ORAL_TABLET | Freq: Every day | ORAL | Status: AC
  Administered 2017-04-17: 200 mg via ORAL
  Filled 2017-04-14: qty 2

## 2017-04-14 MED ORDER — CLOZAPINE 100 MG PO TABS
400.0000 mg | ORAL_TABLET | Freq: Every day | ORAL | Status: DC
  Administered 2017-04-19 – 2017-04-28 (×10): 400 mg via ORAL
  Filled 2017-04-14 (×11): qty 4

## 2017-04-14 MED ORDER — CLOZAPINE 100 MG PO TABS
100.0000 mg | ORAL_TABLET | Freq: Every day | ORAL | Status: AC
  Administered 2017-04-16: 100 mg via ORAL
  Filled 2017-04-14: qty 1

## 2017-04-14 MED ORDER — CLOZAPINE 100 MG PO TABS
300.0000 mg | ORAL_TABLET | Freq: Every day | ORAL | Status: AC
  Administered 2017-04-18: 300 mg via ORAL
  Filled 2017-04-14: qty 3

## 2017-04-14 MED ORDER — FENTANYL CITRATE (PF) 100 MCG/2ML IJ SOLN
50.0000 ug | INTRAMUSCULAR | Status: DC | PRN
  Administered 2017-04-14 – 2017-04-16 (×10): 50 ug via INTRAVENOUS
  Filled 2017-04-14 (×10): qty 2

## 2017-04-14 NOTE — Evaluation (Signed)
Occupational Therapy Evaluation Patient Details Name: Jenna Hill MRN: [id number] DOB: 1970/06/09 Today's Date: 04/14/2017    History of Present Illness pt is a 47 y/o female with pmh significant for schizophrenia, HTN, bipolar d/o and attempted suicide admitted with AMS and hypothermia, suspected intentional OD of psych medication.  Treating for aspiration and sepsis.  Extubated 3/8.   Clinical Impression   Patient presenting with decreased I in self care, functional mobility, balance, safety awareness, strength, and endurance.  Patient reports being I PTA. Patient currently functioning mod A overall and min A for bed mobility.Pt with increased pain in R LE and ankle throughout session just with rolling and declined functional transfers and OOB tasks. Patient will benefit from acute OT to increase overall independence in the areas of ADLs, functional mobility, and safety awareness in order to safely discharge to next venue of care.    Follow Up Recommendations  CIR    Equipment Recommendations  Other (comment)(to be determined at next venue of care)    Recommendations for Other Services       Precautions / Restrictions Precautions Precautions: Fall Precaution Comments: No pf/df at R Foot.      Mobility Bed Mobility Overal bed mobility: Needs Assistance Bed Mobility: Rolling Rolling: Min assist    General bed mobility comments: declined sitting EOB or OOB activities during evaluation  Transfers    General transfer comment: declined transfers        ADL either performed or assessed with clinical judgement   ADL Overall ADL's : Needs assistance/impaired       General ADL Comments: upon entering the room, pt supine in bed and NT present in room. Pt not making an attempt to wash peri area or buttocks. She declined donning B socks this session.      Vision Baseline Vision/History: Wears glasses Wears Glasses: Reading only Patient Visual Report: No change from  baseline              Pertinent Vitals/Pain Pain Assessment: 0-10 Pain Score: 9  Pain Location: R thigh and lower leg Pain Descriptors / Indicators: Aching;Grimacing Pain Intervention(s): Monitored during session;Limited activity within patient's tolerance     Hand Dominance Right   Extremity/Trunk Assessment Upper Extremity Assessment Upper Extremity Assessment: Generalized weakness   Lower Extremity Assessment Lower Extremity Assessment: Defer to PT evaluation       Communication Communication Communication: No difficulties   Cognition Arousal/Alertness: Awake/alert Behavior During Therapy: Flat affect Overall Cognitive Status: Within Functional Limits for tasks assessed                 Home Living Family/patient expects to be discharged to:: Private residence Living Arrangements: Other relatives Available Help at Discharge: Friend(s);Available PRN/intermittently Type of Home: Apartment Home Access: Stairs to enter Entrance Stairs-Number of Steps: 3 Entrance Stairs-Rails: None Home Layout: One level     Bathroom Shower/Tub: Teacher, early years/pre: Standard     Home Equipment: None       Prior Functioning/Environment Level of Independence: Independent   Comments: drove and ran her own errands, but didn't work due to her schizophrenia.        OT Problem List: Decreased strength;Impaired balance (sitting and/or standing);Decreased knowledge of precautions;Pain;Decreased range of motion;Decreased safety awareness;Increased edema;Cardiopulmonary status limiting activity;Decreased activity tolerance;Decreased coordination;Decreased knowledge of use of DME or AE      OT Treatment/Interventions: Self-care/ADL training;Therapeutic exercise;Neuromuscular education;Energy conservation;DME and/or AE instruction;Therapeutic activities;Balance training;Patient/family education    OT Goals(Current goals can be  found in the care plan section) Acute  Rehab OT Goals Patient Stated Goal: Pt reports, " To walk better" OT Goal Formulation: With patient Time For Goal Achievement: 04/28/17 Potential to Achieve Goals: Good ADL Goals Pt Will Perform Lower Body Bathing: with min guard assist Pt Will Perform Lower Body Dressing: with min guard assist Pt Will Transfer to Toilet: with min guard assist Pt Will Perform Toileting - Clothing Manipulation and hygiene: with min guard assist  OT Frequency: Min 2X/week   Barriers to D/C:    unknown if pt has caregiver support or not          AM-PAC PT "6 Clicks" Daily Activity     Outcome Measure Help from another person eating meals?: None Help from another person taking care of personal grooming?: A Little Help from another person toileting, which includes using toliet, bedpan, or urinal?: A Lot Help from another person bathing (including washing, rinsing, drying)?: A Lot Help from another person to put on and taking off regular upper body clothing?: A Little Help from another person to put on and taking off regular lower body clothing?: A Lot 6 Click Score: 16   End of Session Nurse Communication: Patient requests pain meds  Activity Tolerance: Patient limited by pain Patient left: in bed;with nursing/sitter in room;with call bell/phone within reach;with bed alarm set  OT Visit Diagnosis: Muscle weakness (generalized) (M62.81);Pain Pain - Right/Left: Right Pain - part of body: Leg;Ankle and joints of foot                Time: 7893-8101 OT Time Calculation (min): 14 min Charges:  OT General Charges $OT Visit: 1 Visit OT Evaluation $OT Eval Moderate Complexity: 1 Mod   Akeiba Axelson P, MS, OTR/L, CBIS 04/14/2017, 3:22 PM

## 2017-04-14 NOTE — Progress Notes (Signed)
[facility]enna [facility] Progress Note  Subjective: Min UOP recorded- had HD yest- removed around 3 liters- HR went up but it was high before as well and remains high - she is c/o pain- right sided  - abd CT- possible pancreatitis but does not fit clinically   Vitals:   04/14/17 0437 04/14/17 0500 04/14/17 0626 04/14/17 0742  BP:   (!) 146/89 (!) 154/77  Pulse:   (!) 125 (!) 125  Resp:   (!) 25   Temp: 98.6 F (37 C)   98.5 F (36.9 C)  TempSrc: Oral   Axillary  SpO2:   97% 95%  Weight:  129.6 kg (285 lb 11.5 oz)    Height:        Inpatient medications: . amLODipine  5 mg Oral Daily  . calcium acetate  667 mg Oral TID WC  . Chlorhexidine Gluconate Cloth  6 each Topical Q0600  . cloZAPine  25 mg Oral Daily  . diclofenac sodium  4 g Topical QID  . heparin injection (subcutaneous)  5,000 Units Subcutaneous Q8H  . QUEtiapine  400 mg Oral QHS  . ranitidine  150 mg Oral BID  . vortioxetine HBr  20 mg Oral QHS   .  ceFAZolin (ANCEF) IV Stopped (04/13/17 2123)  . ferric gluconate (FERRLECIT/NULECIT) IV Stopped (04/13/17 1215)   acetaminophen, fentaNYL (SUBLIMAZE) injection, hydrALAZINE, ondansetron (ZOFRAN) IV  Exam: Gen obese young AAF,  no distress but mult c/o's Chest occ rhonchi, mostly clear RRR no MRG Abd soft ntnd no mass or ascites +bs obese GU foley cath draining small amts of clear brownish urine MS no joint effusions or deformity Ext  pitting  edema elsewhere LE's Neuro Ox 3, NF   Home meds: - abilify injection monthly / clozapine bid/ doxepin hs/ seroquel xr bid/ trintellix qd / eszopiclone prn - HCTZ 25 qd/ ecasa    UA - yellow, neg Hb/ LE/ nitrite, 5.0 , prot 30, 1.021, 0-5 wbc/ rbc ECHO - mild LVH, o/w normal study CXR 3/8 > Pulmonary vascular congestion may reflect mild fluid overload. Left lower lobe consolidation unchanged. UDS negative, etoh/ salicylate negative, HIV neg   Impression: 1  Acute renal failure - found down, AKI due to ATN from  severe rhabdo-  still not making much urine. Vol overloaded. SP HD  3/8 and 3/9,  3/11and 3/13- lytes stable. Monitor UOP and labs with PRN HD and await return of renal fxn- baseline renal function in Feb 2018 was normal. Does not need HD today - will follow- if pattern stays will need HD tomorrow.  [name] now 68 days old - will likely keep in thru the week-- if needs dialysis past that will need tunneled cath   2  Severe depression/ schizoaffective disorder - on multiple psych meds-  restarted - per primary  3  Resp failure - resolved- still overloaded- UF as able  4  AMS - resolved 5  HTN - stable on norvasc- goes up at times maybe due to anxiety  6. Anemia- iron stores low- on ferrlecit  7. Bones- check intact PTH- last phos 6.2, started phoslo - PTH 132- no meds needed  8. Pain- on right side- if that was the side she was down on could be rhabdo pain      [name] A  04/14/2017, 10:25 AM   Recent Labs  Lab 04/08/17 0436 04/09/17 0601  04/12/17 0835 04/13/17 0501 04/14/17 0145  NA 141 137   < > 131* 128* 130*  K 3.4* 3.5   < > 4.1 3.9 4.1  CL 103 97*   < > 94* 92* 94*  CO2 21* 24   < > 25 23 25   GLUCOSE 125* 123*   < > 109* 111* 98  BUN 70* 54*   < > 43* 56* 30*  CREATININE 7.49* 6.71*   < > 6.84* 8.17* 5.82*  CALCIUM 6.8* 7.0*   < > 8.0* 8.1* 7.7*  PHOS 7.5* 6.8*  --   --  6.2*  --    < > = values in this interval not displayed.   Recent Labs  Lab 04/09/17 0601 04/12/17 0835 04/13/17 0501 04/14/17 0145  AST 423* 152*  --  102*  ALT 233* 158*  --  122*  ALKPHOS 92 117  --  111  BILITOT 1.0 0.9  --  0.6  PROT 5.5* 5.5*  --  5.2*  ALBUMIN 2.0* 1.8* 1.7* 1.8*   Recent Labs  Lab 04/12/17 0835 04/13/17 0501 04/13/17 1035 04/13/17 2041 04/14/17 0145  WBC 20.9* 17.4* 24.3*  --  23.8*  NEUTROABS 15.5* 13.6*  --   --   --   HGB 10.0* 7.6* 9.8* 9.5* 8.8*  HCT 29.6* 22.2* 28.5* 27.9* 26.6*  MCV 86.3 86.4 85.6  --  86.4  PLT 205 152 228  --  243    Iron/TIBC/Ferritin/ %Sat    Component Value Date/Time   IRON 31 04/12/2017 0835   TIBC 196 (L) 04/12/2017 0835   FERRITIN 302 04/12/2017 0835   IRONPCTSAT 16 04/12/2017 0835

## 2017-04-14 NOTE — Progress Notes (Signed)
I met with pt at bedside to assess needs and caregiver support. She lives at home independently pta. Currently AKI due to rhabdo. Psych consult completion pending f/u call from friend and provider as outpt to clarify information. I will follow her progress to assist with dispo as needed. [phone]

## 2017-04-14 NOTE — Progress Notes (Signed)
PROGRESS NOTE    Jenna Hill  WCB:[id number] DOB: [birth year] DOA: 04/05/2017 PCP: Alfonse Spruce, FNP    Brief Narrative by Dr Wynelle Cleveland: " Jenna Hill is a 47 y/o F with h/o bipolar disorder, schizophrenia and HTN who presented the the hospital from home on 3/5 obtunded with hypothermia & tachycardia. Narcan did not help. She was intubated for airway protection. Vomited during intubation. Lactic acid 10.54,  K 2.7, BUN 21, Cr 2.01 CK > 50,000, Lipase 207, ASH 349, ALD 184 CXR >  Vascular congestion. Received 4 days of Unasyn  on admission for possible aspiration Received aggressive IV hydration but renal function continued to worsen and nephrology was consulted.  Extubated on 3/8. Dialysis also started same day.   Patient still requiring HD, she has persistent leukocytosis and vomiting.    Assessment & Plan:   Principal Problem:   Schizoaffective disorder, depressive type ([place]) Active Problems:   Hypertension   Acute encephalopathy   Acute respiratory failure (HCC)   Shock liver   Rhabdomyolysis   Bipolar 1 disorder (HCC)  1-Acute encephalopathy;  Bipolar, schizoaffective disorder.  ? Medication overdose. Psych consulted.  CT head negative,  APAP and salicylate negative.  UDS negative.  Trintellix resume. Her psychiatric medications; clozapine, Seroquel, doxepin were resume 3-12.  Psych consulted , patient was hearing voices, and to help adjust medications.  Discussed with Dr Elmore Guise; will resume clozapine to 25 mg them tomorrow 50 mg them increment dose 100--200--300-400, to avoid myocarditis.   Psych also recommend in-pateint psychiatric facility admission. Sitter at bedside.   Tachycardia; hb stable. Fluctuates,.   2-Acute respiratory Failure;  intubated on admission and extubated on 3/8 - resp virus panel negative - resp culture> no growth - LLL consolidation on CXR -treated with Unasyn.   3-Rhabdomyolysis- severe swelling of right leg CK improving.  Now  reuiring HD>  Discussed with Dr Moshe Cipro, will discontinue IV fluids.  - CT  On 3/9: Moderate subcutaneous edema and fat stranding about the right lateral hip and circumferentially through the thigh, nonspecific, but can be seen with cellulitis. No drainable fluid collection. -  venous duplex of Right leg is negative No redness right lower extremity.  CK 5,000.  Report generalized pain. Will increase fentanyl.   AKI; secondary to Rhabdomyolysis.  Requiring HD. Nephrology following.    Transaminases; shock liver vs rhabdo.  Follow enzymes.  Hepatitis b negative.   Leukocytosis. Patient with worsening leukocytosis. Ct hip with edema, consider cellulitis.  Started  ancef. Also patient with vomiting, transaminases.  Continue with ancef.  CT abdomen only showed ? Pancreatitis. Lipase negative. Change diet to clear diet   Anemia; hb range 10--9.  Hb this am at 7.6, repeated at 9.  No evidence of active bleeding.   Hyponatremia. Electrolytes correction with HD.   DVT prophylaxis: Heparin  Code Status: full code.  Family Communication: care discussed with patient.  Disposition Plan: CIR, when leukocytosis improved, check CT abdomen.   Consultants:   Nephrology  CCM    Procedures:   Intubation-Extubation    Antimicrobials: Ampicillin received for 4 days.  Ancef 3-13  Subjective: She denies abdominal pain, report vomiting.  Complaints of generalized body pain   Objective: Vitals:   04/14/17 1026 04/14/17 1126 04/14/17 1127 04/14/17 1159  BP: 122/72  124/73   Pulse: (!) 119  (!) 119   Resp: (!) 22  18   Temp:  98.2 F (36.8 C)  98.7 F (37.1 C)  TempSrc:  Axillary  Oral  SpO2: 95%  95%   Weight:      Height:        Intake/Output Summary (Last 24 hours) at 04/14/2017 1524 Last data filed at 04/14/2017 1408 Gross per 24 hour  Intake 740 ml  Output 3257 ml  Net -2517 ml   Filed Weights   04/13/17 1300 04/13/17 1730 04/14/17 0500  Weight: 127.2 kg (280  lb 6.8 oz) 124.5 kg (274 lb 7.6 oz) 129.6 kg (285 lb 11.5 oz)    Examination:  General exam: NAD Respiratory system; normal respiratory effort  Crackles bases.  Cardiovascular system: S 1, S 2 RRR. Gastrointestinal system: BS present, soft, nt Central nervous system: alert. Non focal.  Extremities: Symmetric power.  Skin: No rash    Data Reviewed: I have personally reviewed following labs and imaging studies  CBC: Recent Labs  Lab 04/11/17 0739 04/12/17 0835 04/13/17 0501 04/13/17 1035 04/13/17 2041 04/14/17 0145  WBC 18.0* 20.9* 17.4* 24.3*  --  23.8*  NEUTROABS  --  15.5* 13.6*  --   --   --   HGB 9.8* 10.0* 7.6* 9.8* 9.5* 8.8*  HCT 28.3* 29.6* 22.2* 28.5* 27.9* 26.6*  MCV 85.5 86.3 86.4 85.6  --  86.4  PLT 181 205 152 228  --  884   Basic Metabolic Panel: Recent Labs  Lab 04/08/17 0436 04/09/17 0601 04/10/17 0423 04/11/17 0739 04/12/17 0835 04/13/17 0501 04/14/17 0145  NA 141 137 135 128* 131* 128* 130*  K 3.4* 3.5 3.7 3.7 4.1 3.9 4.1  CL 103 97* 98* 92* 94* 92* 94*  CO2 21* 24 25 23 25 23 25   GLUCOSE 125* 123* 139* 114* 109* 111* 98  BUN 70* 54* 36* 55* 43* 56* 30*  CREATININE 7.49* 6.71* 5.45* 7.71* 6.84* 8.17* 5.82*  CALCIUM 6.8* 7.0* 7.3* 7.6* 8.0* 8.1* 7.7*  MG 2.2 2.0  --   --   --   --   --   PHOS 7.5* 6.8*  --   --   --  6.2*  --    GFR: Estimated Creatinine Clearance: 16.8 mL/min (A) (by C-G formula based on SCr of 5.82 mg/dL (H)). Liver Function Tests: Recent Labs  Lab 04/08/17 0436 04/09/17 0601 04/12/17 0835 04/13/17 0501 04/14/17 0145  AST 492* 423* 152*  --  102*  ALT 240* 233* 158*  --  122*  ALKPHOS 47 92 117  --  111  BILITOT 0.7 1.0 0.9  --  0.6  PROT 7.2 5.5* 5.5*  --  5.2*  ALBUMIN 1.9* 2.0* 1.8* 1.7* 1.8*   Recent Labs  Lab 04/14/17 0627  LIPASE 30   No results for input(s): AMMONIA in the last 168 hours. Coagulation Profile: No results for input(s): INR, PROTIME in the last 168 hours. Cardiac Enzymes: Recent Labs    Lab 04/09/17 0601 04/12/17 0835 04/14/17 0145  CKTOTAL 37,372* 4,558* 3,827*   BNP (last 3 results) No results for input(s): PROBNP in the last 8760 hours. HbA1C: No results for input(s): HGBA1C in the last 72 hours. CBG: Recent Labs  Lab 04/10/17 1957 04/10/17 2307 04/11/17 0352 04/11/17 0815 04/11/17 1129  GLUCAP 98 96 92 91 90   Lipid Profile: No results for input(s): CHOL, HDL, LDLCALC, TRIG, CHOLHDL, LDLDIRECT in the last 72 hours. Thyroid Function Tests: No results for input(s): TSH, T4TOTAL, FREET4, T3FREE, THYROIDAB in the last 72 hours. Anemia Panel: Recent Labs    04/12/17 0835  FERRITIN 302  TIBC 196*  IRON 31  Sepsis Labs: No results for input(s): PROCALCITON, LATICACIDVEN in the last 168 hours.  Recent Results (from the past 240 hour(s))  Respiratory Panel by PCR     Status: None   Collection Time: 04/05/17  4:01 PM  Result Value Ref Range Status   Adenovirus NOT DETECTED NOT DETECTED Final   Coronavirus 229E NOT DETECTED NOT DETECTED Final   Coronavirus HKU1 NOT DETECTED NOT DETECTED Final   Coronavirus NL63 NOT DETECTED NOT DETECTED Final   Coronavirus OC43 NOT DETECTED NOT DETECTED Final   Metapneumovirus NOT DETECTED NOT DETECTED Final   Rhinovirus / Enterovirus NOT DETECTED NOT DETECTED Final   Influenza A NOT DETECTED NOT DETECTED Final   Influenza B NOT DETECTED NOT DETECTED Final   Parainfluenza Virus 1 NOT DETECTED NOT DETECTED Final   Parainfluenza Virus 2 NOT DETECTED NOT DETECTED Final   Parainfluenza Virus 3 NOT DETECTED NOT DETECTED Final   Parainfluenza Virus 4 NOT DETECTED NOT DETECTED Final   Respiratory Syncytial Virus NOT DETECTED NOT DETECTED Final   Bordetella pertussis NOT DETECTED NOT DETECTED Final   Chlamydophila pneumoniae NOT DETECTED NOT DETECTED Final   Mycoplasma pneumoniae NOT DETECTED NOT DETECTED Final    Comment: Performed at [facility], [address] [place], [place] [address]  Culture, blood  (routine x 2)     Status: None   Collection Time: 04/05/17  5:51 PM  Result Value Ref Range Status   Specimen Description   Final    BLOOD RIGHT HAND Performed at [facility] [address], [place], [place] [address]    Special Requests   Final    IN PEDIATRIC BOTTLE Blood Culture adequate volume Performed at [facility] [address], [place], [place] [address]    Culture   Final    NO GROWTH 5 DAYS Performed at [facility], [place] [address], [place], [place] [address]    Report Status 04/10/2017 FINAL  Final  Culture, blood (routine x 2)     Status: None   Collection Time: 04/05/17  5:51 PM  Result Value Ref Range Status   Specimen Description   Final    BLOOD RIGHT HAND Performed at [facility] [address] [place], [place] [address]    Special Requests   Final    IN PEDIATRIC BOTTLE Blood Culture adequate volume Performed at [facility] [address] [place], [place] [address]    Culture   Final    NO GROWTH 5 DAYS Performed at [facility], [place] [address], [place], [place] [address]    Report Status 04/10/2017 FINAL  Final  MRSA PCR Screening     Status: None   Collection Time: 04/05/17  6:13 PM  Result Value Ref Range Status   MRSA by PCR NEGATIVE NEGATIVE Final    Comment:        The GeneXpert MRSA Assay (FDA approved for NASAL specimens only), is one component of a comprehensive MRSA colonization surveillance program. It is not intended to diagnose MRSA infection nor to guide or monitor treatment for MRSA infections. Performed at [facility], [place] [address], [place], [place] [address]   Culture, Urine     Status: Abnormal   Collection Time: 04/11/17 10:49 PM  Result Value Ref Range Status   Specimen Description URINE, CLEAN CATCH  Final   Special Requests NONE  Final   Culture (A)  Final    <  10,000 COLONIES/mL INSIGNIFICANT  GROWTH Performed at [facility], [place] [address] [place], [place] [address]    Report Status 04/13/2017 FINAL  Final  Culture, blood (Routine X 2) w Reflex to ID Panel     Status: None (Preliminary result)   Collection Time: 04/12/17  6:26 PM  Result Value Ref Range Status   Specimen Description BLOOD RIGHT ARM  Final   Special Requests   Final    BOTTLES DRAWN AEROBIC AND ANAEROBIC Blood Culture adequate volume   Culture   Final    NO GROWTH < 24 HOURS Performed at [facility], [place] [address], [place], [place] [address]    Report Status PENDING  Incomplete  Culture, blood (Routine X 2) w Reflex to ID Panel     Status: None (Preliminary result)   Collection Time: 04/12/17  6:40 PM  Result Value Ref Range Status   Specimen Description BLOOD RIGHT HAND  Final   Special Requests IN PEDIATRIC BOTTLE Blood Culture adequate volume  Final   Culture   Final    NO GROWTH < 24 HOURS Performed at [facility], [place] [address] [place],  [address]    Report Status PENDING  Incomplete         Radiology Studies: Ct Abdomen Pelvis Wo Contrast  Result Date: 04/13/2017 CLINICAL DATA:  47 year old female with nausea vomiting. EXAM: CT ABDOMEN AND PELVIS WITHOUT CONTRAST TECHNIQUE: Multidetector CT imaging of the abdomen and pelvis was performed following the standard protocol without IV contrast. COMPARISON:  Abdominal radiograph dated 04/12/2017 and ultrasound dated 04/07/2017 FINDINGS: Evaluation of this exam is limited in the absence of intravenous contrast. Lower chest: Partially visualized small left pleural effusion. There is associated partial compressive atelectasis of the left lung base. No intra-abdominal free air.  No significant free fluid. Hepatobiliary: The liver is unremarkable as visualized. No intrahepatic biliary ductal dilatation. No calcified gallstone. Pancreas: There is inflammatory changes of the pancreas most consistent with acute pancreatitis. No  drainable fluid collection/abscess or pseudocyst. Correlation with clinical exam and pancreatic enzymes recommended. Spleen: Normal in size without focal abnormality. Adrenals/Urinary Tract: The adrenal glands, kidneys, and visualized ureters appear unremarkable. The urinary bladder is decompressed around a Foley catheter. There is apparent diffuse thickening of the bladder wall which may be partly related to underdistention. Cystitis is not excluded. Correlation with urinalysis recommended. Stomach/Bowel: There is no bowel obstruction or active inflammation. Normal appendix. Vascular/Lymphatic: The abdominal aorta and IVC are grossly unremarkable on this noncontrast CT. No portal venous gas. There is no adenopathy. Reproductive: The uterus is anteverted and grossly unremarkable. The ovaries are grossly unremarkable as visualized. Other: There is diffuse subcutaneous and abdominal wall stranding and edema as well as edema of the visualized upper right thigh. No drainable fluid collection. Musculoskeletal: No acute or significant osseous findings. IMPRESSION: 1. Acute pancreatitis.  No fluid collection or abscess. 2. Small left pleural effusion, and subcutaneous edema and anasarca. Electronically Signed   By: Anner Crete M.D.   On: 04/13/2017 23:09        Scheduled Meds: . amLODipine  5 mg Oral Daily  . calcium acetate  667 mg Oral TID WC  . Chlorhexidine Gluconate Cloth  6 each Topical Q0600  . [START ON 04/15/2017] cloZAPine  50 mg Oral QHS   Followed by  . [START ON 04/16/2017] cloZAPine  100 mg Oral QHS   Followed by  . [START ON 04/17/2017] cloZAPine  200 mg Oral QHS  Followed by  . [START ON 04/18/2017] cloZAPine  300 mg Oral QHS   Followed by  . [START ON 04/19/2017] cloZAPine  400 mg Oral QHS  . diclofenac sodium  4 g Topical QID  . heparin injection (subcutaneous)  5,000 Units Subcutaneous Q8H  . QUEtiapine  400 mg Oral QHS  . ranitidine  150 mg Oral BID  . vortioxetine HBr  20 mg  Oral QHS   Continuous Infusions: .  ceFAZolin (ANCEF) IV Stopped (04/13/17 2123)  . ferric gluconate (FERRLECIT/NULECIT) IV Stopped (04/14/17 1125)     LOS: 9 days    Time spent: 35 minutes.     Elmarie Shiley, MD [facility] Pager [phone]  If 7PM-7AM, please contact night-coverage [email or web address] Password [facility] 04/14/2017, 3:24 PM

## 2017-04-14 NOTE — Progress Notes (Signed)
MD informed that patient's pain is not relief by the medication that she is receiving at this time. Will continue to monitor.

## 2017-04-14 NOTE — Progress Notes (Signed)
Patient unable to keep down contrast and began vomiting it back up.  Only received about half of 1 bottle of contrast. This RN notified CT and they informed this RN they would proceed with the CT abdomen anyways. Will continue to monitor and treat per MD orders.

## 2017-04-15 DIAGNOSIS — R450 Nervousness: Secondary | ICD-10-CM

## 2017-04-15 DIAGNOSIS — F419 Anxiety disorder, unspecified: Secondary | ICD-10-CM

## 2017-04-15 LAB — RENAL FUNCTION PANEL
ANION GAP: 14 (ref 5–15)
Albumin: 2 g/dL — ABNORMAL LOW (ref 3.5–5.0)
BUN: 45 mg/dL — ABNORMAL HIGH (ref 6–20)
CALCIUM: 8 mg/dL — AB (ref 8.9–10.3)
CO2: 22 mmol/L (ref 22–32)
CREATININE: 8.25 mg/dL — AB (ref 0.44–1.00)
Chloride: 93 mmol/L — ABNORMAL LOW (ref 101–111)
GFR, EST AFRICAN AMERICAN: 6 mL/min — AB (ref >60)
GFR, EST NON AFRICAN AMERICAN: 5 mL/min — AB (ref >60)
Glucose, Bld: 100 mg/dL — ABNORMAL HIGH (ref 65–99)
Phosphorus: 5.7 mg/dL — ABNORMAL HIGH (ref 2.5–4.6)
Potassium: 4.2 mmol/L (ref 3.5–5.1)
Sodium: 129 mmol/L — ABNORMAL LOW (ref 135–145)

## 2017-04-15 LAB — CBC
HCT: 25.5 % — ABNORMAL LOW (ref 36.0–46.0)
HEMOGLOBIN: 8.5 g/dL — AB (ref 12.0–15.0)
MCH: 29.1 pg (ref 26.0–34.0)
MCHC: 33.3 g/dL (ref 30.0–36.0)
MCV: 87.3 fL (ref 78.0–100.0)
PLATELETS: 322 10*3/uL (ref 150–400)
RBC: 2.92 MIL/uL — AB (ref 3.87–5.11)
RDW: 13.9 % (ref 11.5–15.5)
WBC: 24.9 10*3/uL — AB (ref 4.0–10.5)

## 2017-04-15 LAB — GLUCOSE, CAPILLARY: GLUCOSE-CAPILLARY: 106 mg/dL — AB (ref 65–99)

## 2017-04-15 MED ORDER — LORAZEPAM 2 MG/ML IJ SOLN: 1.0000 mg | Freq: Once | INTRAMUSCULAR | Status: DC

## 2017-04-15 MED ORDER — DOXEPIN HCL 50 MG PO CAPS
50.0000 mg | ORAL_CAPSULE | Freq: Every day | ORAL | Status: DC
  Administered 2017-04-15 – 2017-04-28 (×14): 50 mg via ORAL
  Filled 2017-04-15 (×15): qty 1

## 2017-04-15 MED ORDER — DARBEPOETIN ALFA 100 MCG/0.5ML IJ SOSY
100.0000 ug | PREFILLED_SYRINGE | INTRAMUSCULAR | Status: DC
  Filled 2017-04-15 (×2): qty 0.5

## 2017-04-15 MED ORDER — DIPHENHYDRAMINE HCL 50 MG/ML IJ SOLN
25.0000 mg | Freq: Once | INTRAMUSCULAR | Status: AC
  Administered 2017-04-18: 25 mg via INTRAVENOUS

## 2017-04-15 MED ORDER — ACETAMINOPHEN 325 MG PO TABS
ORAL_TABLET | ORAL | Status: AC
  Filled 2017-04-15: qty 2

## 2017-04-15 MED ORDER — DIPHENHYDRAMINE HCL 50 MG/ML IJ SOLN
INTRAMUSCULAR | Status: AC
  Administered 2017-04-15: 25 mg
  Filled 2017-04-15: qty 1

## 2017-04-15 MED ORDER — ARIPIPRAZOLE ER 400 MG IM PRSY
400.0000 mg | PREFILLED_SYRINGE | Freq: Once | INTRAMUSCULAR | Status: AC
  Administered 2017-04-15: 400 mg via INTRAMUSCULAR
  Filled 2017-04-15: qty 2
  Filled 2017-04-15: qty 400

## 2017-04-15 MED ORDER — LORAZEPAM 2 MG/ML IJ SOLN
INTRAMUSCULAR | Status: AC
  Administered 2017-04-15: 1 mg
  Filled 2017-04-15: qty 1

## 2017-04-15 MED ORDER — DARBEPOETIN ALFA 100 MCG/0.5ML IJ SOSY
100.0000 ug | PREFILLED_SYRINGE | Freq: Once | INTRAMUSCULAR | Status: AC
  Administered 2017-04-15: 100 ug via SUBCUTANEOUS
  Filled 2017-04-15: qty 0.5

## 2017-04-15 MED ORDER — ARIPIPRAZOLE ER 400 MG IM SRER
400.0000 mg | Freq: Once | INTRAMUSCULAR | Status: DC
  Filled 2017-04-15 (×2): qty 2

## 2017-04-15 NOTE — Procedures (Signed)
Patient was seen on dialysis and the procedure was supervised.  BFR 400  Via vascath  BP is  171/98.   Patient appears to be tolerating treatment well- chronic pain complaints   Jenna Hill A 04/15/2017
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
## 2017-04-15 NOTE — Progress Notes (Signed)
Occupational Therapy Treatment Patient Details Name: Jenna Hill MRN: [id number] DOB: [birth year] Today's Date: 04/15/2017    History of present illness pt is a 47 y/o female with pmh significant for schizophrenia, HTN, bipolar d/o and attempted suicide admitted with AMS and hypothermia, suspected intentional OD of psych medication.  Treating for aspiration and sepsis.  Extubated 3/8.   OT comments  Pt progressing towards acute OT goals. Upon therapist arrival pt stating that she was eager to work with therapy,  "I've been waiting for you guys." Focus of session was simulated toilet transfer and educated on UB strengthening/ROM exercises to help prevent deconditioning. Pt sitting up in recliner at end of session with sitter present. Pt stated she was pleased to be sitting up in the chair. D/c plan remains appropriate.   Follow Up Recommendations  CIR    Equipment Recommendations  Other (comment)(TBD next venue)    Recommendations for Other Services      Precautions / Restrictions Precautions Precautions: Fall Precaution Comments: No pf/df at R Foot. Restrictions Weight Bearing Restrictions: No       Mobility Bed Mobility Overal bed mobility: Needs Assistance Bed Mobility: Supine to Sit     Supine to sit: Min assist;HOB elevated     General bed mobility comments: HOB elevated, assist to advance RLE. Extra time and effort, used bedrails.   Transfers Overall transfer level: Needs assistance Equipment used: Rolling walker (2 wheeled) Transfers: Sit to/from [facility] Sit to Stand: Mod assist Stand pivot transfers: Min assist       General transfer comment: mod A to steady, powerup and control descent. Pt preferring to hold onto the rw with both hands to stand so therapist stabilized rw during standing.     Balance Overall balance assessment: Needs assistance Sitting-balance support: No upper extremity supported;Feet supported Sitting balance-Leahy  Scale: Fair     Standing balance support: Bilateral upper extremity supported Standing balance-Leahy Scale: Poor Standing balance comment: reliant on the RW and external support due to R foot instability                           ADL either performed or assessed with clinical judgement   ADL Overall ADL's : Needs assistance/impaired                         Toilet Transfer: Moderate assistance;Stand-pivot;BSC;RW Toilet Transfer Details (indicate cue type and reason): simulated with EOB>recliner. assist to steady and control descent.           General ADL Comments: Pt completed bed mobility and SPT to recliner. Educated on UB ROM/light strengthening exercises.      Vision       Perception     Praxis      Cognition Arousal/Alertness: Awake/alert Behavior During Therapy: Flat affect Overall Cognitive Status: Within Functional Limits for tasks assessed                                 General Comments: low speech volume        Exercises Exercises: Other exercises Other Exercises Other Exercises: educated on general ROM/light resistance exercises for BUE   Shoulder Instructions       General Comments Pt verbalized that she was eager to work with therapy. "I've been waiting for you guys."     Pertinent Vitals/ Pain  Pain Assessment: Faces Faces Pain Scale: Hurts little more Pain Location: R thigh and lower leg Pain Descriptors / Indicators: Aching Pain Intervention(s): Monitored during session;Limited activity within patient's tolerance;Repositioned  Home Living                                          Prior Functioning/Environment              Frequency  Min 2X/week        Progress Toward Goals  OT Goals(current goals can now be found in the care plan section)  Progress towards OT goals: Progressing toward goals  Acute Rehab OT Goals Patient Stated Goal: Pt reports, " To walk better" OT  Goal Formulation: With patient Time For Goal Achievement: 04/28/17 Potential to Achieve Goals: Good ADL Goals Pt Will Perform Lower Body Bathing: with min guard assist Pt Will Perform Lower Body Dressing: with min guard assist Pt Will Transfer to Toilet: with min guard assist Pt Will Perform Toileting - Clothing Manipulation and hygiene: with min guard assist  Plan Discharge plan remains appropriate    Co-evaluation                 AM-PAC PT "6 Clicks" Daily Activity     Outcome Measure   Help from another person eating meals?: None Help from another person taking care of personal grooming?: A Little Help from another person toileting, which includes using toliet, bedpan, or urinal?: A Lot Help from another person bathing (including washing, rinsing, drying)?: A Lot Help from another person to put on and taking off regular upper body clothing?: A Little Help from another person to put on and taking off regular lower body clothing?: A Lot 6 Click Score: 16    End of Session Equipment Utilized During Treatment: Rolling walker  OT Visit Diagnosis: Muscle weakness (generalized) (M62.81);Pain Pain - Right/Left: Right Pain - part of body: Leg;Ankle and joints of foot   Activity Tolerance Patient tolerated treatment well   Patient Left in chair;with call bell/phone within reach;with nursing/sitter in room   Nurse Communication          Time: 2841-3244 OT Time Calculation (min): 20 min  Charges: OT General Charges $OT Visit: 1 Visit OT Treatments $Self Care/Home Management : 8-22 mins     Hortencia Pilar 04/15/2017, 12:34 PM

## 2017-04-15 NOTE — Progress Notes (Signed)
Orthopedic Tech Progress Note Patient Details:  Jenna Hill 17-Aug-1970 [id number]  Ortho Devices Type of Ortho Device: CAM walker Ortho Device/Splint Location: Provided Cam Walker at bedside.  Pt is OTF at this time.  Size large cam walker provided.       Kristopher Oppenheim 04/15/2017, 4:08 PM

## 2017-04-15 NOTE — Progress Notes (Signed)
PT Cancellation Note  Patient Details Name: Jenna Hill MRN: [id number] DOB: Apr 05, 1970   Cancelled Treatment:    Reason Eval/Treat Not Completed: Patient at procedure or test/unavailable. Pt in HD this afternoon, will not be back till 6pm. Will check back as schedule allows.    Benjiman Core, PTA Pager [phone] Acute Rehab  Allena Katz 04/15/2017, 2:58 PM

## 2017-04-15 NOTE — Progress Notes (Signed)
PROGRESS NOTE    Jenna Hill  ERX:[id number] DOB: 02/07/1970 DOA: 04/05/2017 PCP: Alfonse Spruce, FNP    Brief Narrative by Dr Wynelle Cleveland: " Jenna Hill is a 47 y/o F with h/o bipolar disorder, schizophrenia and HTN who presented the the hospital from home on 3/5 obtunded with hypothermia & tachycardia. Narcan did not help. She was intubated for airway protection. Vomited during intubation. Lactic acid 10.54,  K 2.7, BUN 21, Cr 2.01 CK > 50,000, Lipase 207, ASH 349, ALD 184 CXR >  Vascular congestion. Received 4 days of Unasyn  on admission for possible aspiration Received aggressive IV hydration but renal function continued to worsen and nephrology was consulted.  Extubated on 3/8. Dialysis also started same day.   Patient still requiring HD, she has persistent leukocytosis and vomiting.    Assessment & Plan:   Principal Problem:   Schizoaffective disorder, depressive type ([place]) Active Problems:   Hypertension   Acute encephalopathy   Acute respiratory failure (HCC)   Shock liver   Rhabdomyolysis   Bipolar 1 disorder (HCC)  1-Acute encephalopathy;  Bipolar, schizoaffective disorder.  ? Medication overdose. Psych consulted.  CT head negative,  APAP and salicylate negative.  UDS negative.  Trintellix resume. Her psychiatric medications; clozapine, Seroquel, doxepin were resume 3-12.  Psych consulted , patient was hearing voices, and to help adjust medications.  Discussed with Dr Elmore Guise; will resume clozapine to 25 mg them tomorrow 50 mg them increment dose 100--200--300-400, to avoid myocarditis.   Evaluated by psych, patient does not need inpatient psychiatric admission.  Restarted on Abilify. IM. And doxepin.  patient appears improved today.     Tachycardia; hb stable. Fluctuates,.   2-Acute respiratory Failure;  intubated on admission and extubated on 3/8 - resp virus panel negative - resp culture> no growth - LLL consolidation on CXR -treated with Unasyn.     3-Rhabdomyolysis- severe swelling of right leg CK improving.  Now reuiring HD>  Discussed with Dr Moshe Cipro, will discontinue IV fluids.  - CT  On 3/9: Moderate subcutaneous edema and fat stranding about the right lateral hip and circumferentially through the thigh, nonspecific, but can be seen with cellulitis. No drainable fluid collection. -  venous duplex of Right leg is negative No redness right lower extremity.  CK 5,000.  Report generalized pain.  increase fentanyl.  Right leg appears more swollen, and tight. She report decreased feeling left toes. Ortho consulted. Concern for compartment syndrome.   AKI; secondary to Rhabdomyolysis.  Requiring HD. Nephrology following.    Transaminases; shock liver vs rhabdo.  Follow enzymes.  Hepatitis b negative.   Leukocytosis. Patient with worsening leukocytosis. Ct hip with edema, consider cellulitis.  Started  ancef. Also patient with vomiting, transaminases.  Continue with ancef.  CT abdomen only showed ? Pancreatitis. Lipase negative. Change diet to clear diet  Persist , follow trend.   Anemia; hb range 10--9.  Hb this am at 7.6, repeated at 9.  No evidence of active bleeding.   Hyponatremia. Electrolytes correction with HD.   DVT prophylaxis: Heparin  Code Status: full code.  Family Communication: care discussed with patient.  Disposition Plan: CIR, when leukocytosis improved, check CT abdomen.   Consultants:   Nephrology  CCM    Procedures:   Intubation-Extubation    Antimicrobials: Ampicillin received for 4 days.  Ancef 3-13  Subjective: She has better spirit, she is smiling . Complaining of right ;eg pain, decreased feeling in her toes. .    Objective: Vitals:  04/15/17 1404 04/15/17 1430 04/15/17 1500 04/15/17 1530  BP: (!) 171/98 (!) 154/94 (!) 172/97 121/77  Pulse: (!) 121 (!) 120 (!) 122 (!) 126  Resp: (!) 28   (!) 26  Temp:      TempSrc:      SpO2: 100% 100% 100% 100%  Weight:       Height:        Intake/Output Summary (Last 24 hours) at 04/15/2017 1616 Last data filed at 04/15/2017 1300 Gross per 24 hour  Intake 360 ml  Output -  Net 360 ml   Filed Weights   04/14/17 0500 04/15/17 0623 04/15/17 1356  Weight: 129.6 kg (285 lb 11.5 oz) 125.5 kg (276 lb 10.8 oz) 126 kg (277 lb 12.5 oz)    Examination:  General exam: NAD  Respiratory system; Normal respiratory effort, crackles bases.  Cardiovascular system: S 1, S 2 RRR Gastrointestinal system: BS present, soft, nt Central nervous system: alert Extremities: Symmetric power. Right leg with edema, very tight to palpation.  Skin: no rash    Data Reviewed: I have personally reviewed following labs and imaging studies  CBC: Recent Labs  Lab 04/12/17 0835 04/13/17 0501 04/13/17 1035 04/13/17 2041 04/14/17 0145 04/15/17 1116  WBC 20.9* 17.4* 24.3*  --  23.8* 24.9*  NEUTROABS 15.5* 13.6*  --   --   --   --   HGB 10.0* 7.6* 9.8* 9.5* 8.8* 8.5*  HCT 29.6* 22.2* 28.5* 27.9* 26.6* 25.5*  MCV 86.3 86.4 85.6  --  86.4 87.3  PLT 205 152 228  --  243 355   Basic Metabolic Panel: Recent Labs  Lab 04/09/17 0601  04/11/17 0739 04/12/17 0835 04/13/17 0501 04/14/17 0145 04/15/17 0444  NA 137   < > 128* 131* 128* 130* 129*  K 3.5   < > 3.7 4.1 3.9 4.1 4.2  CL 97*   < > 92* 94* 92* 94* 93*  CO2 24   < > 23 25 23 25 22   GLUCOSE 123*   < > 114* 109* 111* 98 100*  BUN 54*   < > 55* 43* 56* 30* 45*  CREATININE 6.71*   < > 7.71* 6.84* 8.17* 5.82* 8.25*  CALCIUM 7.0*   < > 7.6* 8.0* 8.1* 7.7* 8.0*  MG 2.0  --   --   --   --   --   --   PHOS 6.8*  --   --   --  6.2*  --  5.7*   < > = values in this interval not displayed.   GFR: Estimated Creatinine Clearance: 11.7 mL/min (A) (by C-G formula based on SCr of 8.25 mg/dL (H)). Liver Function Tests: Recent Labs  Lab 04/09/17 0601 04/12/17 0835 04/13/17 0501 04/14/17 0145 04/15/17 0444  AST 423* 152*  --  102*  --   ALT 233* 158*  --  122*  --   ALKPHOS 92  117  --  111  --   BILITOT 1.0 0.9  --  0.6  --   PROT 5.5* 5.5*  --  5.2*  --   ALBUMIN 2.0* 1.8* 1.7* 1.8* 2.0*   Recent Labs  Lab 04/14/17 0627  LIPASE 30   No results for input(s): AMMONIA in the last 168 hours. Coagulation Profile: No results for input(s): INR, PROTIME in the last 168 hours. Cardiac Enzymes: Recent Labs  Lab 04/09/17 0601 04/12/17 0835 04/14/17 0145  CKTOTAL 37,372* 4,558* 3,827*   BNP (last 3 results) No results  for input(s): PROBNP in the last 8760 hours. HbA1C: No results for input(s): HGBA1C in the last 72 hours. CBG: Recent Labs  Lab 04/10/17 1957 04/10/17 2307 04/11/17 0352 04/11/17 0815 04/11/17 1129  GLUCAP 98 96 92 91 90   Lipid Profile: No results for input(s): CHOL, HDL, LDLCALC, TRIG, CHOLHDL, LDLDIRECT in the last 72 hours. Thyroid Function Tests: No results for input(s): TSH, T4TOTAL, FREET4, T3FREE, THYROIDAB in the last 72 hours. Anemia Panel: No results for input(s): VITAMINB12, FOLATE, FERRITIN, TIBC, IRON, RETICCTPCT in the last 72 hours. Sepsis Labs: No results for input(s): PROCALCITON, LATICACIDVEN in the last 168 hours.  Recent Results (from the past 240 hour(s))  Culture, blood (routine x 2)     Status: None   Collection Time: 04/05/17  5:51 PM  Result Value Ref Range Status   Specimen Description   Final    BLOOD RIGHT HAND Performed at [facility] [address], [place], [place] [address]    Special Requests   Final    IN PEDIATRIC BOTTLE Blood Culture adequate volume Performed at [facility] [address], [place], [place] [address]    Culture   Final    NO GROWTH 5 DAYS Performed at [facility], [place] [address] [place], [place] [address]    Report Status 04/10/2017 FINAL  Final  Culture, blood (routine x 2)     Status: None   Collection Time: 04/05/17  5:51 PM  Result Value Ref Range Status   Specimen Description   Final    BLOOD RIGHT HAND Performed  at [facility] [address], [place], [place] [address]    Special Requests   Final    IN PEDIATRIC BOTTLE Blood Culture adequate volume Performed at [facility] [address] [place], [place] [address]    Culture   Final    NO GROWTH 5 DAYS Performed at [facility], [place] [address], [place], [place] [address]    Report Status 04/10/2017 FINAL  Final  MRSA PCR Screening     Status: None   Collection Time: 04/05/17  6:13 PM  Result Value Ref Range Status   MRSA by PCR NEGATIVE NEGATIVE Final    Comment:        The GeneXpert MRSA Assay (FDA approved for NASAL specimens only), is one component of a comprehensive MRSA colonization surveillance program. It is not intended to diagnose MRSA infection nor to guide or monitor treatment for MRSA infections. Performed at [facility], [place] [address], [place], [place] [address]   Culture, Urine     Status: Abnormal   Collection Time: 04/11/17 10:49 PM  Result Value Ref Range Status   Specimen Description URINE, CLEAN CATCH  Final   Special Requests NONE  Final   Culture (A)  Final    <10,000 COLONIES/mL INSIGNIFICANT GROWTH Performed at [facility], [place] [address] [place], [place] [address]    Report Status 04/13/2017 FINAL  Final  Culture, blood (Routine X 2) w Reflex to ID Panel     Status: None (Preliminary result)   Collection Time: 04/12/17  6:26 PM  Result Value Ref Range Status   Specimen Description BLOOD RIGHT ARM  Final   Special Requests   Final    BOTTLES DRAWN AEROBIC AND ANAEROBIC Blood Culture adequate volume   Culture   Final    NO GROWTH 3 DAYS Performed at [facility], [address] [place], [place], [place] [address]    Report Status PENDING  Incomplete  Culture, blood (Routine X 2) w Reflex to ID Panel     Status: None (Preliminary result)   Collection Time: 04/12/17  6:40 PM  Result Value Ref Range Status   Specimen Description  BLOOD RIGHT HAND  Final   Special Requests IN PEDIATRIC BOTTLE Blood Culture adequate volume  Final   Culture   Final    NO GROWTH 3 DAYS Performed at [facility], [place] [address], [place], [place] [address]    Report Status PENDING  Incomplete         Radiology Studies: Ct Abdomen Pelvis Wo Contrast  Result Date: 04/13/2017 CLINICAL DATA:  47 year old female with nausea vomiting. EXAM: CT ABDOMEN AND PELVIS WITHOUT CONTRAST TECHNIQUE: Multidetector CT imaging of the abdomen and pelvis was performed following the standard protocol without IV contrast. COMPARISON:  Abdominal radiograph dated 04/12/2017 and ultrasound dated 04/07/2017 FINDINGS: Evaluation of this exam is limited in the absence of intravenous contrast. Lower chest: Partially visualized small left pleural effusion. There is associated partial compressive atelectasis of the left lung base. No intra-abdominal free air.  No significant free fluid. Hepatobiliary: The liver is unremarkable as visualized. No intrahepatic biliary ductal dilatation. No calcified gallstone. Pancreas: There is inflammatory changes of the pancreas most consistent with acute pancreatitis. No drainable fluid collection/abscess or pseudocyst. Correlation with clinical exam and pancreatic enzymes recommended. Spleen: Normal in size without focal abnormality. Adrenals/Urinary Tract: The adrenal glands, kidneys, and visualized ureters appear unremarkable. The urinary bladder is decompressed around a Foley catheter. There is apparent diffuse thickening of the bladder wall which may be partly related to underdistention. Cystitis is not excluded. Correlation with urinalysis recommended. Stomach/Bowel: There is no bowel obstruction or active inflammation. Normal appendix. Vascular/Lymphatic: The abdominal aorta and IVC are grossly unremarkable on this noncontrast CT. No portal venous gas. There is no adenopathy. Reproductive: The uterus is anteverted and grossly  unremarkable. The ovaries are grossly unremarkable as visualized. Other: There is diffuse subcutaneous and abdominal wall stranding and edema as well as edema of the visualized upper right thigh. No drainable fluid collection. Musculoskeletal: No acute or significant osseous findings. IMPRESSION: 1. Acute pancreatitis.  No fluid collection or abscess. 2. Small left pleural effusion, and subcutaneous edema and anasarca. Electronically Signed   By: Anner Crete M.D.   On: 04/13/2017 23:09        Scheduled Meds: . acetaminophen      . amLODipine  5 mg Oral Daily  . ARIPiprazole ER  400 mg Intramuscular Once  . calcium acetate  667 mg Oral TID WC  . Chlorhexidine Gluconate Cloth  6 each Topical Q0600  . cloZAPine  50 mg Oral QHS   Followed by  . [START ON 04/16/2017] cloZAPine  100 mg Oral QHS   Followed by  . [START ON 04/17/2017] cloZAPine  200 mg Oral QHS   Followed by  . [START ON 04/18/2017] cloZAPine  300 mg Oral QHS   Followed by  . [START ON 04/19/2017] cloZAPine  400 mg Oral QHS  . darbepoetin (ARANESP) injection - DIALYSIS  100 mcg Intravenous Q Fri-HD  . diclofenac sodium  4 g Topical QID  . diphenhydrAMINE  25 mg Intravenous Once  . doxepin  50 mg Oral QHS  . heparin injection (subcutaneous)  5,000 Units Subcutaneous Q8H  . LORazepam  1 mg Intravenous Once  . QUEtiapine  400 mg Oral QHS  .  ranitidine  150 mg Oral BID  . vortioxetine HBr  20 mg Oral QHS   Continuous Infusions: .  ceFAZolin (ANCEF) IV Stopped (04/14/17 2057)  . ferric gluconate (FERRLECIT/NULECIT) IV Stopped (04/15/17 1216)     LOS: 10 days    Time spent: 35 minutes.     Elmarie Shiley, MD [facility] Pager [phone]  If 7PM-7AM, please contact night-coverage [email or web address] Password [facility] 04/15/2017, 4:16 PM

## 2017-04-15 NOTE — Progress Notes (Signed)
[facility] Progress Note  Subjective: no UOP - - she is c/o pain- right sided  - abd CT- possible pancreatitis but does not fit clinically   Vitals:   04/15/17 0623 04/15/17 0630 04/15/17 0820 04/15/17 0955  BP: (!) 169/122 (!) 143/98 (!) 142/81 (!) 157/82  Pulse:  (!) 123 (!) 121   Resp:  (!) 24 20   Temp: 98.5 F (36.9 C)  98.2 F (36.8 C)   TempSrc:   Oral   SpO2: 98% 98% 99%   Weight: 125.5 kg (276 lb 10.8 oz)     Height:        Inpatient medications: . amLODipine  5 mg Oral Daily  . ARIPiprazole ER  400 mg Intramuscular Once  . calcium acetate  667 mg Oral TID WC  . Chlorhexidine Gluconate Cloth  6 each Topical Q0600  . cloZAPine  50 mg Oral QHS   Followed by  . [START ON 04/16/2017] cloZAPine  100 mg Oral QHS   Followed by  . [START ON 04/17/2017] cloZAPine  200 mg Oral QHS   Followed by  . [START ON 04/18/2017] cloZAPine  300 mg Oral QHS   Followed by  . [START ON 04/19/2017] cloZAPine  400 mg Oral QHS  . diclofenac sodium  4 g Topical QID  . doxepin  50 mg Oral QHS  . heparin injection (subcutaneous)  5,000 Units Subcutaneous Q8H  . QUEtiapine  400 mg Oral QHS  . ranitidine  150 mg Oral BID  . vortioxetine HBr  20 mg Oral QHS   .  ceFAZolin (ANCEF) IV Stopped (04/14/17 2057)  . ferric gluconate (FERRLECIT/NULECIT) IV 125 mg (04/15/17 1058)   acetaminophen, fentaNYL (SUBLIMAZE) injection, hydrALAZINE, ondansetron (ZOFRAN) IV  Exam: Gen obese young AAF,  no distress but mult c/o's Chest occ rhonchi, mostly clear RRR no MRG Abd soft ntnd no mass or ascites +bs obese GU foley cath draining small amts of clear brownish urine MS no joint effusions or deformity Ext  pitting  edema elsewhere LE's Neuro Ox 3, NF   Home meds: - abilify injection monthly / clozapine bid/ doxepin hs/ seroquel xr bid/ trintellix qd / eszopiclone prn - HCTZ 25 qd/ ecasa    UA - yellow, neg Hb/ LE/ nitrite, 5.0 , prot 30, 1.021, 0-5 wbc/ rbc ECHO - mild LVH, o/w  normal study CXR 3/8 > Pulmonary vascular congestion may reflect mild fluid overload. Left lower lobe consolidation unchanged. UDS negative, etoh/ salicylate negative, HIV neg   Impression: 1  Acute renal failure - found down, AKI due to ATN from severe rhabdo-  still not making much urine. Vol overloaded. SP HD  3/8 and 3/9,  3/11,  3/13- lytes stable but needs HD today. Monitor UOP and labs with PRN HD and await return of renal fxn- baseline renal function in Feb 2018 was normal.  [name] now 67 days old - will likely keep in thru the week-- if needs dialysis past that will need tunneled cath   2  Severe depression/ schizoaffective disorder - on multiple psych meds-  restarted - per primary  3  Resp failure - resolved- still overloaded- UF as able  4  AMS - resolved 5  HTN - stable on norvasc- goes up at times maybe due to anxiety  6. Anemia- iron stores low- on ferrlecit - will start aranesp give 100 today  7. Bones- check intact PTH- last phos 6.2, started phoslo - PTH 132- no meds needed  8.  Pain- on right side- if that was the side she was down on could be rhabdo pain      Gaige Fussner A  04/15/2017, 11:29 AM   Recent Labs  Lab 04/09/17 0601  04/13/17 0501 04/14/17 0145 04/15/17 0444  NA 137   < > 128* 130* 129*  K 3.5   < > 3.9 4.1 4.2  CL 97*   < > 92* 94* 93*  CO2 24   < > 23 25 22   GLUCOSE 123*   < > 111* 98 100*  BUN 54*   < > 56* 30* 45*  CREATININE 6.71*   < > 8.17* 5.82* 8.25*  CALCIUM 7.0*   < > 8.1* 7.7* 8.0*  PHOS 6.8*  --  6.2*  --  5.7*   < > = values in this interval not displayed.   Recent Labs  Lab 04/09/17 0601 04/12/17 0835 04/13/17 0501 04/14/17 0145 04/15/17 0444  AST 423* 152*  --  102*  --   ALT 233* 158*  --  122*  --   ALKPHOS 92 117  --  111  --   BILITOT 1.0 0.9  --  0.6  --   PROT 5.5* 5.5*  --  5.2*  --   ALBUMIN 2.0* 1.8* 1.7* 1.8* 2.0*   Recent Labs  Lab 04/12/17 0835 04/13/17 0501 04/13/17 1035 04/13/17 2041  04/14/17 0145  WBC 20.9* 17.4* 24.3*  --  23.8*  NEUTROABS 15.5* 13.6*  --   --   --   HGB 10.0* 7.6* 9.8* 9.5* 8.8*  HCT 29.6* 22.2* 28.5* 27.9* 26.6*  MCV 86.3 86.4 85.6  --  86.4  PLT 205 152 228  --  243   Iron/TIBC/Ferritin/ %Sat    Component Value Date/Time   IRON 31 04/12/2017 0835   TIBC 196 (L) 04/12/2017 0835   FERRITIN 302 04/12/2017 0835   IRONPCTSAT 16 04/12/2017 0835

## 2017-04-15 NOTE — Consult Note (Signed)
Reason for Consult:RLE pain Referring Physician: B Regalado  Jenna Hill is an 47 y.o. female.  HPI: Jenna Hill overdosed on her medication on 3/5. She was found down unresponsive. It was unknown how long she was on the ground but she was significantly hypothermic when she came in. She was intubated and admitted to critical care. She promptly developed rhabdomyolysis and was started on HD. She was able to be extubated on 3/8 and promptly c/o right hip pain. CT scans were done which showed circumferential thigh edema but no fx. She continued to c/o pain and sensory changes for the next week. She had instability of the foot when she got up with PT. Orthopedic surgery was consulted today for the swelling essentially to r/o compartment syndrome. She thinks her symptoms have been getting a little worse over the course of week. She describes entire limb pain that's worst in the lateral thigh/buttock and N/T of the foot as well as inability to move it well.  Past Medical History:  Diagnosis Date  . Bipolar 1 disorder ([place])   . Hypertension   . Schizophrenia [facility])     Past Surgical History:  Procedure Laterality Date  . ANKLE ARTHROSCOPY Right   . CESAREAN SECTION    . HAND SURGERY Right   . ORIF ANKLE FRACTURE Left 01/29/2016   Procedure: OPEN REDUCTION INTERNAL FIXATION LEFT ANKLE BIMALLEOLAR FRACTURE AND SYNDESMOSIS;  Surgeon: Wylene Simmer, MD;  Location: [place];  Service: Orthopedics;  Laterality: Left;    Family History  Problem Relation Age of Onset  . Healthy Mother   . Healthy Father   . Healthy Sister     Social History:  reports that  has never smoked. she has never used smokeless tobacco. She reports that she drinks alcohol. She reports that she does not use drugs.  Allergies:  Allergies  Allergen Reactions  . Bee Venom Anaphylaxis  . Bretylium     Other reaction(s): Seizures  . Codeine Anaphylaxis  . Other Anaphylaxis    bertrillium or bertillium    .  Toradol [Ketorolac Tromethamine] Swelling  . Ibuprofen     Raised hives   . Latex Rash    Medications: I have reviewed the patient's current medications.  Results for orders placed or performed during the hospital encounter of 04/05/17 (from the past 48 hour(s))  Hemoglobin and hematocrit, blood     Status: Abnormal   Collection Time: 04/13/17  8:41 PM  Result Value Ref Range   Hemoglobin 9.5 (L) 12.0 - 15.0 g/dL   HCT 27.9 (L) 36.0 - 46.0 %    Comment: Performed at [facility], [address], [place], [place] [address]  Hepatic function panel     Status: Abnormal   Collection Time: 04/14/17  1:45 AM  Result Value Ref Range   Total Protein 5.2 (L) 6.5 - 8.1 g/dL   Albumin 1.8 (L) 3.5 - 5.0 g/dL   AST 102 (H) 15 - 41 U/L   ALT 122 (H) 14 - 54 U/L   Alkaline Phosphatase 111 38 - 126 U/L   Total Bilirubin 0.6 0.3 - 1.2 mg/dL   Bilirubin, Direct 0.2 0.1 - 0.5 mg/dL   Indirect Bilirubin 0.4 0.3 - 0.9 mg/dL    Comment: Performed at [facility] [address] [place], [place] [address]  CK     Status: Abnormal   Collection Time: 04/14/17  1:45 AM  Result Value Ref Range   Total CK 3,827 (  H) 38 - 234 U/L    Comment: Performed at [facility], [place] [address] [place], [place] [address]  Basic metabolic panel     Status: Abnormal   Collection Time: 04/14/17  1:45 AM  Result Value Ref Range   Sodium 130 (L) 135 - 145 mmol/L   Potassium 4.1 3.5 - 5.1 mmol/L   Chloride 94 (L) 101 - 111 mmol/L   CO2 25 22 - 32 mmol/L   Glucose, Bld 98 65 - 99 mg/dL   BUN 30 (H) 6 - 20 mg/dL   Creatinine, Ser 5.82 (H) 0.44 - 1.00 mg/dL   Calcium 7.7 (L) 8.9 - 10.3 mg/dL   GFR calc non Af Amer 8 (L) >60 mL/min   GFR calc Af Amer 9 (L) >60 mL/min    Comment: (NOTE) The eGFR has been calculated using the CKD EPI equation. This calculation has not been validated in all clinical situations. eGFR's persistently <60 mL/min signify possible Chronic Kidney Disease.    Anion gap 11 5  - 15    Comment: Performed at [facility] [address] [place], [place] [address]  CBC     Status: Abnormal   Collection Time: 04/14/17  1:45 AM  Result Value Ref Range   WBC 23.8 (H) 4.0 - 10.5 K/uL   RBC 3.08 (L) 3.87 - 5.11 MIL/uL   Hemoglobin 8.8 (L) 12.0 - 15.0 g/dL   HCT 26.6 (L) 36.0 - 46.0 %   MCV 86.4 78.0 - 100.0 fL   MCH 28.6 26.0 - 34.0 pg   MCHC 33.1 30.0 - 36.0 g/dL   RDW 13.6 11.5 - 15.5 %   Platelets 243 150 - 400 K/uL    Comment: Performed at [facility], [place] [address] [place], [place] [address]  Lipase, blood     Status: None   Collection Time: 04/14/17  6:27 AM  Result Value Ref Range   Lipase 30 11 - 51 U/L    Comment: Performed at [facility] [address], [place], [place] [address]  Renal function panel     Status: Abnormal   Collection Time: 04/15/17  4:44 AM  Result Value Ref Range   Sodium 129 (L) 135 - 145 mmol/L   Potassium 4.2 3.5 - 5.1 mmol/L   Chloride 93 (L) 101 - 111 mmol/L   CO2 22 22 - 32 mmol/L   Glucose, Bld 100 (H) 65 - 99 mg/dL   BUN 45 (H) 6 - 20 mg/dL   Creatinine, Ser 8.25 (H) 0.44 - 1.00 mg/dL   Calcium 8.0 (L) 8.9 - 10.3 mg/dL   Phosphorus 5.7 (H) 2.5 - 4.6 mg/dL   Albumin 2.0 (L) 3.5 - 5.0 g/dL   GFR calc non Af Amer 5 (L) >60 mL/min   GFR calc Af Amer 6 (L) >60 mL/min    Comment: (NOTE) The eGFR has been calculated using the CKD EPI equation. This calculation has not been validated in all clinical situations. eGFR's persistently <60 mL/min signify possible Chronic Kidney Disease.    Anion gap 14 5 - 15    Comment: Performed at [facility] [address] [place] [address]  CBC     Status: Abnormal   Collection Time: 04/15/17 11:16 AM  Result Value Ref Range   WBC 24.9 (H) 4.0 - 10.5 K/uL   RBC 2.92 (L) 3.87 - 5.11 MIL/uL   Hemoglobin 8.5 (L) 12.0 - 15.0 g/dL  HCT 25.5 (L) 36.0 - 46.0 %   MCV 87.3 78.0 - 100.0 fL   MCH 29.1 26.0 - 34.0 pg   MCHC 33.3 30.0 - 36.0 g/dL   RDW  13.9 11.5 - 15.5 %   Platelets 322 150 - 400 K/uL    Comment: Performed at [facility] [address] [place], [place] [address]    Ct Abdomen Pelvis Wo Contrast  Result Date: 04/13/2017 CLINICAL DATA:  47 year old female with nausea vomiting. EXAM: CT ABDOMEN AND PELVIS WITHOUT CONTRAST TECHNIQUE: Multidetector CT imaging of the abdomen and pelvis was performed following the standard protocol without IV contrast. COMPARISON:  Abdominal radiograph dated 04/12/2017 and ultrasound dated 04/07/2017 FINDINGS: Evaluation of this exam is limited in the absence of intravenous contrast. Lower chest: Partially visualized small left pleural effusion. There is associated partial compressive atelectasis of the left lung base. No intra-abdominal free air.  No significant free fluid. Hepatobiliary: The liver is unremarkable as visualized. No intrahepatic biliary ductal dilatation. No calcified gallstone. Pancreas: There is inflammatory changes of the pancreas most consistent with acute pancreatitis. No drainable fluid collection/abscess or pseudocyst. Correlation with clinical exam and pancreatic enzymes recommended. Spleen: Normal in size without focal abnormality. Adrenals/Urinary Tract: The adrenal glands, kidneys, and visualized ureters appear unremarkable. The urinary bladder is decompressed around a Foley catheter. There is apparent diffuse thickening of the bladder wall which may be partly related to underdistention. Cystitis is not excluded. Correlation with urinalysis recommended. Stomach/Bowel: There is no bowel obstruction or active inflammation. Normal appendix. Vascular/Lymphatic: The abdominal aorta and IVC are grossly unremarkable on this noncontrast CT. No portal venous gas. There is no adenopathy. Reproductive: The uterus is anteverted and grossly unremarkable. The ovaries are grossly unremarkable as visualized. Other: There is diffuse subcutaneous and abdominal wall stranding and edema as well as  edema of the visualized upper right thigh. No drainable fluid collection. Musculoskeletal: No acute or significant osseous findings. IMPRESSION: 1. Acute pancreatitis.  No fluid collection or abscess. 2. Small left pleural effusion, and subcutaneous edema and anasarca. Electronically Signed   By: Anner Crete M.D.   On: 04/13/2017 23:09    Review of Systems  Constitutional: Negative for weight loss.  HENT: Negative for ear discharge, ear pain, hearing loss and tinnitus.   Eyes: Negative for blurred vision, double vision, photophobia and pain.  Respiratory: Negative for cough, sputum production and shortness of breath.   Cardiovascular: Negative for chest pain.  Gastrointestinal: Negative for abdominal pain, nausea and vomiting.  Genitourinary: Negative for dysuria, flank pain, frequency and urgency.  Musculoskeletal: Positive for joint pain (RLE). Negative for back pain, falls, myalgias and neck pain.  Neurological: Positive for sensory change and focal weakness (Right foot). Negative for dizziness, tingling, loss of consciousness and headaches.  Endo/Heme/Allergies: Does not bruise/bleed easily.  Psychiatric/Behavioral: Negative for depression, memory loss and substance abuse. The patient is not nervous/anxious.    Blood pressure (!) 171/98, pulse (!) 121, temperature (!) 97.3 F (36.3 C), temperature source Oral, resp. rate (!) 28, height 5' 6.5" (1.689 m), weight 126 kg (277 lb 12.5 oz), SpO2 100 %. Physical Exam  Constitutional: She appears well-developed and well-nourished. No distress.  HENT:  Head: Normocephalic and atraumatic.  Eyes: Conjunctivae are normal. Right eye exhibits no discharge. Left eye exhibits no discharge. No scleral icterus.  Neck: Normal range of motion.  Cardiovascular: Normal rate and regular rhythm.  Respiratory: Effort normal. No respiratory distress.  Musculoskeletal:  LLE No traumatic wounds, ecchymosis,  or rash  Thigh swollen, firm but not tense, mod  TTP  No knee or ankle effusion  Knee stable to varus/ valgus and anterior/posterior stress  Sens SPN, TN paresthetic, DPN absent  Motor EHL 1/5, ext 2/5, flex 0/5  DP 1+, PT 0, 2+ NP edema  Neurological: She is alert.  Skin: Skin is warm and dry. She is not diaphoretic.  Psychiatric: She has a normal mood and affect. Her behavior is normal.    Assessment/Plan: RLE pain -- This is certainly due to muscle ischemia, likely from lying on it for whatever period of time after her overdose. She does not have current compartment syndrome. Unfortunately there is nothing much to do for her except treat her symptomatically and give it time. It's possible she'll regain feeling and function in her foot if the nerves were just compressed vs infarcted. I would recommend ubiquitous elevation along with compression with thigh high TED's or ACE wrap. She can use a CAM boot when ambulating to give her ankle some better stability. She should probably wear this at all times or get a separate PRAFO for non-ambulatory periods so that she doesn't develop foot drop. Ongoing PT/OT will be essential.    Lisette Abu, PA-C Orthopedic Surgery [phone] 04/15/2017, 2:54 PM

## 2017-04-16 LAB — CBC
HEMATOCRIT: 24.1 % — AB (ref 36.0–46.0)
HEMOGLOBIN: 8.1 g/dL — AB (ref 12.0–15.0)
MCH: 29.7 pg (ref 26.0–34.0)
MCHC: 33.6 g/dL (ref 30.0–36.0)
MCV: 88.3 fL (ref 78.0–100.0)
Platelets: 314 10*3/uL (ref 150–400)
RBC: 2.73 MIL/uL — ABNORMAL LOW (ref 3.87–5.11)
RDW: 14 % (ref 11.5–15.5)
WBC: 21.1 10*3/uL — AB (ref 4.0–10.5)

## 2017-04-16 LAB — RENAL FUNCTION PANEL
ANION GAP: 10 (ref 5–15)
Albumin: 1.9 g/dL — ABNORMAL LOW (ref 3.5–5.0)
BUN: 23 mg/dL — AB (ref 6–20)
CHLORIDE: 96 mmol/L — AB (ref 101–111)
CO2: 26 mmol/L (ref 22–32)
Calcium: 7.9 mg/dL — ABNORMAL LOW (ref 8.9–10.3)
Creatinine, Ser: 5.67 mg/dL — ABNORMAL HIGH (ref 0.44–1.00)
GFR calc Af Amer: 9 mL/min — ABNORMAL LOW (ref >60)
GFR calc non Af Amer: 8 mL/min — ABNORMAL LOW (ref >60)
GLUCOSE: 104 mg/dL — AB (ref 65–99)
POTASSIUM: 4.2 mmol/L (ref 3.5–5.1)
Phosphorus: 4.3 mg/dL (ref 2.5–4.6)
Sodium: 132 mmol/L — ABNORMAL LOW (ref 135–145)

## 2017-04-16 MED ORDER — BISACODYL 10 MG RE SUPP
10.0000 mg | Freq: Once | RECTAL | Status: AC
  Administered 2017-04-16: 10 mg via RECTAL
  Filled 2017-04-16: qty 1

## 2017-04-16 MED ORDER — FENTANYL CITRATE (PF) 100 MCG/2ML IJ SOLN
50.0000 ug | INTRAMUSCULAR | Status: DC | PRN
  Administered 2017-04-16 – 2017-04-20 (×23): 50 ug via INTRAVENOUS
  Filled 2017-04-16 (×25): qty 2

## 2017-04-16 NOTE — Progress Notes (Signed)
[facility] Progress Note  Subjective:  HD yest- removed 2000 tolerated well.  100 of urine !!! - - she is c/o pain- right sided  -    Vitals:   04/16/17 0308 04/16/17 0500 04/16/17 0510 04/16/17 1000  BP: 110/73   121/79  Pulse: (!) 113   (!) 117  Resp: (!) 22   (!) 23  Temp:   98.2 F (36.8 C)   TempSrc:    Axillary  SpO2: 96%   97%  Weight:  126.5 kg (278 lb 14.1 oz)    Height:        Inpatient medications: . amLODipine  5 mg Oral Daily  . calcium acetate  667 mg Oral TID WC  . Chlorhexidine Gluconate Cloth  6 each Topical Q0600  . cloZAPine  100 mg Oral QHS   Followed by  . [START ON 04/17/2017] cloZAPine  200 mg Oral QHS   Followed by  . [START ON 04/18/2017] cloZAPine  300 mg Oral QHS   Followed by  . [START ON 04/19/2017] cloZAPine  400 mg Oral QHS  . darbepoetin (ARANESP) injection - DIALYSIS  100 mcg Intravenous Q Fri-HD  . diclofenac sodium  4 g Topical QID  . diphenhydrAMINE  25 mg Intravenous Once  . doxepin  50 mg Oral QHS  . heparin injection (subcutaneous)  5,000 Units Subcutaneous Q8H  . LORazepam  1 mg Intravenous Once  . QUEtiapine  400 mg Oral QHS  . ranitidine  150 mg Oral BID  . vortioxetine HBr  20 mg Oral QHS   .  ceFAZolin (ANCEF) IV Stopped (04/15/17 2109)  . ferric gluconate (FERRLECIT/NULECIT) IV Stopped (04/15/17 1216)   acetaminophen, fentaNYL (SUBLIMAZE) injection, hydrALAZINE, ondansetron (ZOFRAN) IV  Exam: Gen obese young AAF,  no distress but mult c/o's Chest occ rhonchi, mostly clear RRR no MRG Abd soft ntnd no mass or ascites +bs obese GU foley cath draining small amts of clear brownish urine MS no joint effusions or deformity Ext  pitting  edema elsewhere LE's Neuro Ox 3, NF   Home meds: - abilify injection monthly / clozapine bid/ doxepin hs/ seroquel xr bid/ trintellix qd / eszopiclone prn - HCTZ 25 qd/ ecasa    UA - yellow, neg Hb/ LE/ nitrite, 5.0 , prot 30, 1.021, 0-5 wbc/ rbc ECHO - mild LVH, o/w normal  study CXR 3/8 > Pulmonary vascular congestion may reflect mild fluid overload. Left lower lobe consolidation unchanged. UDS negative, etoh/ salicylate negative, HIV neg   Impression: 1  Acute renal failure - found down, AKI due to ATN from severe rhabdo-  still not making much urine but maybe  more. Vol overloaded. SP HD  3/8 and 3/9,  3/11,  3/13 and 3/15-will plan for next  HD Monday if UOP does not improve. Monitor UOP and labs with PRN HD and await return of renal fxn- baseline renal function in Feb 2018 was normal.  [name] now 44 days old - will likely keep in thru Monday-- if needs dialysis past that will need tunneled cath   2  Severe depression/ schizoaffective disorder - on multiple psych meds-  restarted - per primary - requiring a sitter 3  Resp failure - resolved- still overloaded- UF as able  4  AMS - resolved 5  HTN - stable on norvasc- goes up at times maybe due to anxiety  6. Anemia- iron stores low- on ferrlecit - will start aranesp  100 on 3/15 7. Bones- - last phos  6.2, started phoslo - PTH 132- no meds needed  8. Pain- on right side- if that was the side she was down on could be rhabdo pain      Trystyn Dolley A  04/16/2017, 11:54 AM   Recent Labs  Lab 04/13/17 0501 04/14/17 0145 04/15/17 0444 04/16/17 0406  NA 128* 130* 129* 132*  K 3.9 4.1 4.2 4.2  CL 92* 94* 93* 96*  CO2 23 25 22 26   GLUCOSE 111* 98 100* 104*  BUN 56* 30* 45* 23*  CREATININE 8.17* 5.82* 8.25* 5.67*  CALCIUM 8.1* 7.7* 8.0* 7.9*  PHOS 6.2*  --  5.7* 4.3   Recent Labs  Lab 04/12/17 0835  04/14/17 0145 04/15/17 0444 04/16/17 0406  AST 152*  --  102*  --   --   ALT 158*  --  122*  --   --   ALKPHOS 117  --  111  --   --   BILITOT 0.9  --  0.6  --   --   PROT 5.5*  --  5.2*  --   --   ALBUMIN 1.8*   < > 1.8* 2.0* 1.9*   < > = values in this interval not displayed.   Recent Labs  Lab 04/12/17 0835 04/13/17 0501  04/14/17 0145 04/15/17 1116 04/16/17 1119  WBC 20.9* 17.4*    < > 23.8* 24.9* 21.1*  NEUTROABS 15.5* 13.6*  --   --   --   --   HGB 10.0* 7.6*   < > 8.8* 8.5* 8.1*  HCT 29.6* 22.2*   < > 26.6* 25.5* 24.1*  MCV 86.3 86.4   < > 86.4 87.3 88.3  PLT 205 152   < > 243 322 314   < > = values in this interval not displayed.   Iron/TIBC/Ferritin/ %Sat    Component Value Date/Time   IRON 31 04/12/2017 0835   TIBC 196 (L) 04/12/2017 0835   FERRITIN 302 04/12/2017 0835   IRONPCTSAT 16 04/12/2017 4696

## 2017-04-16 NOTE — Progress Notes (Signed)
Pharmacy Antibiotic Note  Jenna Hill is a 47 y.o. female admitted on 04/05/2017 with rhabdomyelitis.  Pt was found down, thought secondary to drug overdose.  Pt continues to have swelling to R hip and now WBC trending up.  Pharmacy has been consulted for Ancef dosing for cellulitis.  Also of note, pt with AKI, requiring intermittent HD currently. Most recent HD treatment was on 3/15. Afebrile, wbc remains elevated at 24.9K.  Plan: Ancef 1gm IV q24h - give after HD on dialysis days.  F/u LOT  Height: 5' 6.5" (168.9 cm) Weight: 278 lb 14.1 oz (126.5 kg) IBW/kg (Calculated) : 60.45  Temp (24hrs), Avg:98.2 F (36.8 C), Min:97.3 F (36.3 C), Max:99.2 F (37.3 C)  Recent Labs  Lab 04/12/17 0835 04/13/17 0501 04/13/17 1035 04/14/17 0145 04/15/17 0444 04/15/17 1116 04/16/17 0406  WBC 20.9* 17.4* 24.3* 23.8*  --  24.9*  --   CREATININE 6.84* 8.17*  --  5.82* 8.25*  --  5.67*    Estimated Creatinine Clearance: 17 mL/min (A) (by C-G formula based on SCr of 5.67 mg/dL (H)).    Allergies  Allergen Reactions  . Bee Venom Anaphylaxis  . Bretylium     Other reaction(s): Seizures  . Codeine Anaphylaxis  . Other Anaphylaxis    bertrillium or bertillium    . Toradol [Ketorolac Tromethamine] Swelling  . Ibuprofen     Raised hives   . Latex Rash    Antimicrobials this admission: Unasyn 3/10 >> 3/15  Ancef  3/13 >>   Dose adjustments this admission:   Microbiology results: 3/12 BCx: ngtd 3/11 UCx: insig growth  3/5 MRSA PCR: negative  Thank you for allowing pharmacy to be a part of this patient's care.  Maryanna Shape, PharmD, BCPS  Clinical Pharmacist  Pager: [phone]   04/16/2017 11:05 AM

## 2017-04-16 NOTE — Progress Notes (Signed)
PROGRESS NOTE    Jenna Hill  FTD:[id number] DOB: [birth year] DOA: 04/05/2017 PCP: Alfonse Spruce, FNP    Brief Narrative by Dr Wynelle Cleveland: " Jenna Hill is a 47 y/o F with h/o bipolar disorder, schizophrenia and HTN who presented the the hospital from home on 3/5 obtunded with hypothermia & tachycardia. Narcan did not help. She was intubated for airway protection. Vomited during intubation. Lactic acid 10.54,  K 2.7, BUN 21, Cr 2.01 CK > 50,000, Lipase 207, ASH 349, ALD 184 CXR >  Vascular congestion. Received 4 days of Unasyn  on admission for possible aspiration Received aggressive IV hydration but renal function continued to worsen and nephrology was consulted.  Extubated on 3/8. Dialysis also started same day.   Patient still requiring HD, she has persistent leukocytosis and vomiting.    Assessment & Plan:   Principal Problem:   Schizoaffective disorder, depressive type ([place]) Active Problems:   Hypertension   Acute encephalopathy   Acute respiratory failure (HCC)   Shock liver   Rhabdomyolysis   Bipolar 1 disorder (HCC)  1-Acute encephalopathy;  Bipolar, schizoaffective disorder.  ? Medication overdose. Psych consulted.  CT head negative,  APAP and salicylate negative.  UDS negative.  Trintellix resume. Her psychiatric medications; clozapine, Seroquel, doxepin were resume 3-12.  Psych consulted , patient was hearing voices, and to help adjust medications.  Discussed with Dr Elmore Guise; will resume clozapine to 25 mg them tomorrow 50 mg them increment dose 100--200--300-400, to avoid myocarditis.   Evaluated by psych, patient does not need inpatient psychiatric admission.  Restarted on Abilify. IM. And doxepin.  Improving   Tachycardia; hb stable. Fluctuates,.   2-Acute respiratory Failure;  intubated on admission and extubated on 3/8 - resp virus panel negative - resp culture> no growth - LLL consolidation on CXR -treated with Unasyn.   3-Rhabdomyolysis-  severe swelling of right leg CK improving.  Now reuiring HD>  Discussed with Dr Moshe Cipro, will discontinue IV fluids.  - CT  On 3/9: Moderate subcutaneous edema and fat stranding about the right lateral hip and circumferentially through the thigh, nonspecific, but can be seen with cellulitis. No drainable fluid collection. -  venous duplex of Right leg is negative No redness right lower extremity.  CK 5,000.  Report generalized pain.  increase fentanyl.  Right leg appears more swollen, and tight. She report decreased feeling left toes. Ortho consulted. No concern for compartment syndrome per ortho.   AKI; secondary to Rhabdomyolysis.  Requiring HD. Nephrology following.    Transaminases; shock liver vs rhabdo.  Follow enzymes.  Hepatitis b negative.   Leukocytosis. Patient with worsening leukocytosis. Ct hip with edema, consider cellulitis.  Started  ancef. Also patient with vomiting, transaminases.  Continue with ancef.  CT abdomen only showed ? Pancreatitis. Lipase negative. Change diet to clear diet  Follow trend.   Anemia; hb range 10--9.  Hb stable at 8  Hyponatremia. Electrolytes correction with HD.   DVT prophylaxis: Heparin  Code Status: full code.  Family Communication: care discussed with patient.  Disposition Plan: CIR, when leukocytosis improved, check CT abdomen.   Consultants:   Nephrology  CCM    Procedures:   Intubation-Extubation    Antimicrobials: Ampicillin received for 4 days.  Ancef 3-13  Subjective: She is complaining of pain in her leg, every 4 hours pain medication is not enough.   Objective: Vitals:   04/16/17 0500 04/16/17 0510 04/16/17 1000 04/16/17 1500  BP:   121/79   Pulse:   Marland Kitchen)  117   Resp:   (!) 23   Temp:  98.2 F (36.8 C) 98.8 F (37.1 C) 98.3 F (36.8 C)  TempSrc:   Oral Oral  SpO2:   97%   Weight: 126.5 kg (278 lb 14.1 oz)     Height:        Intake/Output Summary (Last 24 hours) at 04/16/2017 1619 Last  data filed at 04/16/2017 0511 Gross per 24 hour  Intake -  Output 2100 ml  Net -2100 ml   Filed Weights   04/15/17 1356 04/15/17 1811 04/16/17 0500  Weight: 126 kg (277 lb 12.5 oz) 124.5 kg (274 lb 7.6 oz) 126.5 kg (278 lb 14.1 oz)    Examination:  General exam: NAD Respiratory system; CTA Cardiovascular system: S 1, S 2 RRR Gastrointestinal system: BS present, soft, nt Central nervous system: Alert.  Extremities: Symmetric power. Right leg with edema.  Skin: no rash    Data Reviewed: I have personally reviewed following labs and imaging studies  CBC: Recent Labs  Lab 04/12/17 0835 04/13/17 0501 04/13/17 1035 04/13/17 2041 04/14/17 0145 04/15/17 1116 04/16/17 1119  WBC 20.9* 17.4* 24.3*  --  23.8* 24.9* 21.1*  NEUTROABS 15.5* 13.6*  --   --   --   --   --   HGB 10.0* 7.6* 9.8* 9.5* 8.8* 8.5* 8.1*  HCT 29.6* 22.2* 28.5* 27.9* 26.6* 25.5* 24.1*  MCV 86.3 86.4 85.6  --  86.4 87.3 88.3  PLT 205 152 228  --  243 322 063   Basic Metabolic Panel: Recent Labs  Lab 04/12/17 0835 04/13/17 0501 04/14/17 0145 04/15/17 0444 04/16/17 0406  NA 131* 128* 130* 129* 132*  K 4.1 3.9 4.1 4.2 4.2  CL 94* 92* 94* 93* 96*  CO2 25 23 25 22 26   GLUCOSE 109* 111* 98 100* 104*  BUN 43* 56* 30* 45* 23*  CREATININE 6.84* 8.17* 5.82* 8.25* 5.67*  CALCIUM 8.0* 8.1* 7.7* 8.0* 7.9*  PHOS  --  6.2*  --  5.7* 4.3   GFR: Estimated Creatinine Clearance: 17 mL/min (A) (by C-G formula based on SCr of 5.67 mg/dL (H)). Liver Function Tests: Recent Labs  Lab 04/12/17 0835 04/13/17 0501 04/14/17 0145 04/15/17 0444 04/16/17 0406  AST 152*  --  102*  --   --   ALT 158*  --  122*  --   --   ALKPHOS 117  --  111  --   --   BILITOT 0.9  --  0.6  --   --   PROT 5.5*  --  5.2*  --   --   ALBUMIN 1.8* 1.7* 1.8* 2.0* 1.9*   Recent Labs  Lab 04/14/17 0627  LIPASE 30   No results for input(s): AMMONIA in the last 168 hours. Coagulation Profile: No results for input(s): INR, PROTIME in the  last 168 hours. Cardiac Enzymes: Recent Labs  Lab 04/12/17 0835 04/14/17 0145  CKTOTAL 4,558* 3,827*   BNP (last 3 results) No results for input(s): PROBNP in the last 8760 hours. HbA1C: No results for input(s): HGBA1C in the last 72 hours. CBG: Recent Labs  Lab 04/10/17 2307 04/11/17 0352 04/11/17 0815 04/11/17 1129 04/15/17 1854  GLUCAP 96 92 91 90 106*   Lipid Profile: No results for input(s): CHOL, HDL, LDLCALC, TRIG, CHOLHDL, LDLDIRECT in the last 72 hours. Thyroid Function Tests: No results for input(s): TSH, T4TOTAL, FREET4, T3FREE, THYROIDAB in the last 72 hours. Anemia Panel: No results for input(s): VITAMINB12, FOLATE,  FERRITIN, TIBC, IRON, RETICCTPCT in the last 72 hours. Sepsis Labs: No results for input(s): PROCALCITON, LATICACIDVEN in the last 168 hours.  Recent Results (from the past 240 hour(s))  Culture, Urine     Status: Abnormal   Collection Time: 04/11/17 10:49 PM  Result Value Ref Range Status   Specimen Description URINE, CLEAN CATCH  Final   Special Requests NONE  Final   Culture (A)  Final    <10,000 COLONIES/mL INSIGNIFICANT GROWTH Performed at [facility], [address], [place], [place] [address]    Report Status 04/13/2017 FINAL  Final  Culture, blood (Routine X 2) w Reflex to ID Panel     Status: None (Preliminary result)   Collection Time: 04/12/17  6:26 PM  Result Value Ref Range Status   Specimen Description BLOOD RIGHT ARM  Final   Special Requests   Final    BOTTLES DRAWN AEROBIC AND ANAEROBIC Blood Culture adequate volume   Culture   Final    NO GROWTH 4 DAYS Performed at [facility], [place] [address] [place], [place] [address]    Report Status PENDING  Incomplete  Culture, blood (Routine X 2) w Reflex to ID Panel     Status: None (Preliminary result)   Collection Time: 04/12/17  6:40 PM  Result Value Ref Range Status   Specimen Description BLOOD RIGHT HAND  Final   Special Requests IN PEDIATRIC BOTTLE Blood  Culture adequate volume  Final   Culture   Final    NO GROWTH 4 DAYS Performed at [facility], [place] [address] [place], [place] [address]    Report Status PENDING  Incomplete         Radiology Studies: No results found.      Scheduled Meds: . amLODipine  5 mg Oral Daily  . bisacodyl  10 mg Rectal Once  . calcium acetate  667 mg Oral TID WC  . Chlorhexidine Gluconate Cloth  6 each Topical Q0600  . cloZAPine  100 mg Oral QHS   Followed by  . [START ON 04/17/2017] cloZAPine  200 mg Oral QHS   Followed by  . [START ON 04/18/2017] cloZAPine  300 mg Oral QHS   Followed by  . [START ON 04/19/2017] cloZAPine  400 mg Oral QHS  . darbepoetin (ARANESP) injection - DIALYSIS  100 mcg Intravenous Q Fri-HD  . diclofenac sodium  4 g Topical QID  . diphenhydrAMINE  25 mg Intravenous Once  . doxepin  50 mg Oral QHS  . heparin injection (subcutaneous)  5,000 Units Subcutaneous Q8H  . LORazepam  1 mg Intravenous Once  . QUEtiapine  400 mg Oral QHS  . ranitidine  150 mg Oral BID  . vortioxetine HBr  20 mg Oral QHS   Continuous Infusions: .  ceFAZolin (ANCEF) IV Stopped (04/15/17 2109)  . ferric gluconate (FERRLECIT/NULECIT) IV Stopped (04/15/17 1216)     LOS: 11 days    Time spent: 35 minutes.     Elmarie Shiley, MD [facility] Pager [phone]  If 7PM-7AM, please contact night-coverage [email or web address] Password [facility] 04/16/2017, 4:19 PM

## 2017-04-16 NOTE — Progress Notes (Signed)
Placed patient cell phone and charger cord in the solid utility in safety bin for suicide patients.

## 2017-04-17 LAB — CULTURE, BLOOD (ROUTINE X 2)
CULTURE: NO GROWTH
Culture: NO GROWTH
SPECIAL REQUESTS: ADEQUATE
Special Requests: ADEQUATE

## 2017-04-17 LAB — RENAL FUNCTION PANEL
Albumin: 1.9 g/dL — ABNORMAL LOW (ref 3.5–5.0)
Anion gap: 11 (ref 5–15)
BUN: 32 mg/dL — AB (ref 6–20)
CHLORIDE: 96 mmol/L — AB (ref 101–111)
CO2: 24 mmol/L (ref 22–32)
CREATININE: 7.69 mg/dL — AB (ref 0.44–1.00)
Calcium: 8.1 mg/dL — ABNORMAL LOW (ref 8.9–10.3)
GFR calc Af Amer: 7 mL/min — ABNORMAL LOW (ref >60)
GFR calc non Af Amer: 6 mL/min — ABNORMAL LOW (ref >60)
Glucose, Bld: 92 mg/dL (ref 65–99)
Phosphorus: 5.2 mg/dL — ABNORMAL HIGH (ref 2.5–4.6)
Potassium: 4.6 mmol/L (ref 3.5–5.1)
Sodium: 131 mmol/L — ABNORMAL LOW (ref 135–145)

## 2017-04-17 LAB — CBC
HCT: 25.2 % — ABNORMAL LOW (ref 36.0–46.0)
Hemoglobin: 8.3 g/dL — ABNORMAL LOW (ref 12.0–15.0)
MCH: 28.8 pg (ref 26.0–34.0)
MCHC: 32.9 g/dL (ref 30.0–36.0)
MCV: 87.5 fL (ref 78.0–100.0)
PLATELETS: 356 10*3/uL (ref 150–400)
RBC: 2.88 MIL/uL — ABNORMAL LOW (ref 3.87–5.11)
RDW: 13.6 % (ref 11.5–15.5)
WBC: 24.2 10*3/uL — ABNORMAL HIGH (ref 4.0–10.5)

## 2017-04-17 NOTE — Progress Notes (Signed)
PROGRESS NOTE    Jenna Hill  VVO:[id number] DOB: [birth year] DOA: 04/05/2017 PCP: Jenna Spruce, FNP    Brief Narrative by Dr Wynelle Cleveland: " Jenna Hill is a 47 y/o F with h/o bipolar disorder, schizophrenia and HTN who presented the the hospital from home on 3/5 obtunded with hypothermia & tachycardia. Narcan did not help. She was intubated for airway protection. Vomited during intubation. Lactic acid 10.54,  K 2.7, BUN 21, Cr 2.01 CK > 50,000, Lipase 207, ASH 349, ALD 184 CXR >  Vascular congestion. Received 4 days of Unasyn  on admission for possible aspiration Received aggressive IV hydration but renal function continued to worsen and nephrology was consulted.  Extubated on 3/8. Dialysis also started same day.   Patient still requiring HD, she has persistent leukocytosis and vomiting.    Assessment & Plan:   Principal Problem:   Schizoaffective disorder, depressive type ([place]) Active Problems:   Hypertension   Acute encephalopathy   Acute respiratory failure (HCC)   Shock liver   Rhabdomyolysis   Bipolar 1 disorder (HCC)  1-Acute encephalopathy;  Bipolar, schizoaffective disorder.  ? Medication overdose. Psych consulted.  CT head negative,  APAP and salicylate negative.  UDS negative.  Trintellix resume. Her psychiatric medications; clozapine, Seroquel, doxepin were resume 3-12.  Psych consulted , patient was hearing voices, and to help adjust medications.  Discussed with Dr Elmore Guise; will resume clozapine to 25 mg them tomorrow 50 mg them increment dose 100--200--300-400, to avoid myocarditis.   Evaluated by psych, patient does not need inpatient psychiatric admission.  Restarted on Abilify. IM. And doxepin.  Improving   Tachycardia; hb stable. Fluctuates,.   2-Acute respiratory Failure;  intubated on admission and extubated on 3/8 - resp virus panel negative - resp culture> no growth - LLL consolidation on CXR -treated with Unasyn.   3-Rhabdomyolysis-  severe swelling of right leg CK improving.  Now reuiring HD>  - CT  On 3/9: Moderate subcutaneous edema and fat stranding about the right lateral hip and circumferentially through the thigh, nonspecific, but can be seen with cellulitis. No drainable fluid collection. -  venous duplex of Right leg is negative Report generalized pain.  Increased  fentanyl.  Right leg appears more swollen, and tight. She report decreased feeling left toes. Ortho consulted. No concern for compartment syndrome per ortho.   AKI; secondary to Rhabdomyolysis.  Requiring HD. Nephrology following.    Transaminases; shock liver vs rhabdo.  Follow enzymes. Repeat in am.  Hepatitis b negative.   Leukocytosis. Patient with  Persistent leukocytosis. Ct hip with edema, consider cellulitis.   Continue with ancef to cover for possible cellulitis, day 5.  CT abdomen only showed ? Pancreatitis. Lipase negative. Change diet to clear diet  Follow trend.   Anemia; hb range 10--9.  Hb stable at 8  Hyponatremia. Electrolytes correction with HD.   DVT prophylaxis: Heparin  Code Status: full code.  Family Communication: care discussed with patient.  Disposition Plan: CIR, when leukocytosis improved, check CT abdomen.   Consultants:   Nephrology  CCM    Procedures:   Intubation-Extubation    Antimicrobials: Ampicillin received for 4 days.  Ancef 3-13  Subjective: She is complaining of right leg pain, not worse, still requiring IV fentanyl.   Objective: Vitals:   04/17/17 0504 04/17/17 0542 04/17/17 0618 04/17/17 1413  BP: 123/82   134/68  Pulse: (!) 115   (!) 118  Resp: 15   16  Temp:  98.1 F (36.7 C)  98.2 F (36.8 C)  TempSrc:  Axillary  Oral  SpO2: 96%   99%  Weight:   123.3 kg (271 lb 13.2 oz)   Height:        Intake/Output Summary (Last 24 hours) at 04/17/2017 1456 Last data filed at 04/17/2017 0915 Gross per 24 hour  Intake 470 ml  Output 100 ml  Net 370 ml   Filed Weights    04/15/17 1811 04/16/17 0500 04/17/17 0618  Weight: 124.5 kg (274 lb 7.6 oz) 126.5 kg (278 lb 14.1 oz) 123.3 kg (271 lb 13.2 oz)    Examination:  General exam: NAD Respiratory system; CTA Cardiovascular system: S 1, S 2 RRR Gastrointestinal system: BS present, soft, nt Central nervous system: Alert, non focal.  Extremities: Symmetric power. Right leg with edema..  Skin: No rash    Data Reviewed: I have personally reviewed following labs and imaging studies  CBC: Recent Labs  Lab 04/12/17 0835 04/13/17 0501 04/13/17 1035 04/13/17 2041 04/14/17 0145 04/15/17 1116 04/16/17 1119 04/17/17 0410  WBC 20.9* 17.4* 24.3*  --  23.8* 24.9* 21.1* 24.2*  NEUTROABS 15.5* 13.6*  --   --   --   --   --   --   HGB 10.0* 7.6* 9.8* 9.5* 8.8* 8.5* 8.1* 8.3*  HCT 29.6* 22.2* 28.5* 27.9* 26.6* 25.5* 24.1* 25.2*  MCV 86.3 86.4 85.6  --  86.4 87.3 88.3 87.5  PLT 205 152 228  --  243 322 314 706   Basic Metabolic Panel: Recent Labs  Lab 04/13/17 0501 04/14/17 0145 04/15/17 0444 04/16/17 0406 04/17/17 0410  NA 128* 130* 129* 132* 131*  K 3.9 4.1 4.2 4.2 4.6  CL 92* 94* 93* 96* 96*  CO2 23 25 22 26 24   GLUCOSE 111* 98 100* 104* 92  BUN 56* 30* 45* 23* 32*  CREATININE 8.17* 5.82* 8.25* 5.67* 7.69*  CALCIUM 8.1* 7.7* 8.0* 7.9* 8.1*  PHOS 6.2*  --  5.7* 4.3 5.2*   GFR: Estimated Creatinine Clearance: 12.4 mL/min (A) (by C-G formula based on SCr of 7.69 mg/dL (H)). Liver Function Tests: Recent Labs  Lab 04/12/17 0835 04/13/17 0501 04/14/17 0145 04/15/17 0444 04/16/17 0406 04/17/17 0410  AST 152*  --  102*  --   --   --   ALT 158*  --  122*  --   --   --   ALKPHOS 117  --  111  --   --   --   BILITOT 0.9  --  0.6  --   --   --   PROT 5.5*  --  5.2*  --   --   --   ALBUMIN 1.8* 1.7* 1.8* 2.0* 1.9* 1.9*   Recent Labs  Lab 04/14/17 0627  LIPASE 30   No results for input(s): AMMONIA in the last 168 hours. Coagulation Profile: No results for input(s): INR, PROTIME in the last  168 hours. Cardiac Enzymes: Recent Labs  Lab 04/12/17 0835 04/14/17 0145  CKTOTAL 4,558* 3,827*   BNP (last 3 results) No results for input(s): PROBNP in the last 8760 hours. HbA1C: No results for input(s): HGBA1C in the last 72 hours. CBG: Recent Labs  Lab 04/10/17 2307 04/11/17 0352 04/11/17 0815 04/11/17 1129 04/15/17 1854  GLUCAP 96 92 91 90 106*   Lipid Profile: No results for input(s): CHOL, HDL, LDLCALC, TRIG, CHOLHDL, LDLDIRECT in the last 72 hours. Thyroid Function Tests: No results for input(s): TSH, T4TOTAL, FREET4, T3FREE, THYROIDAB in the last 72  hours. Anemia Panel: No results for input(s): VITAMINB12, FOLATE, FERRITIN, TIBC, IRON, RETICCTPCT in the last 72 hours. Sepsis Labs: No results for input(s): PROCALCITON, LATICACIDVEN in the last 168 hours.  Recent Results (from the past 240 hour(s))  Culture, Urine     Status: Abnormal   Collection Time: 04/11/17 10:49 PM  Result Value Ref Range Status   Specimen Description URINE, CLEAN CATCH  Final   Special Requests NONE  Final   Culture (A)  Final    <10,000 COLONIES/mL INSIGNIFICANT GROWTH Performed at [facility], [address] [name], [place] [address]    Report Status 04/13/2017 FINAL  Final  Culture, blood (Routine X 2) w Reflex to ID Panel     Status: None   Collection Time: 04/12/17  6:26 PM  Result Value Ref Range Status   Specimen Description BLOOD RIGHT ARM  Final   Special Requests   Final    BOTTLES DRAWN AEROBIC AND ANAEROBIC Blood Culture adequate volume   Culture   Final    NO GROWTH 5 DAYS Performed at [facility], [place] [address], [place], [place] [address]    Report Status 04/17/2017 FINAL  Final  Culture, blood (Routine X 2) w Reflex to ID Panel     Status: None   Collection Time: 04/12/17  6:40 PM  Result Value Ref Range Status   Specimen Description BLOOD RIGHT HAND  Final   Special Requests IN PEDIATRIC BOTTLE Blood Culture adequate volume  Final   Culture    Final    NO GROWTH 5 DAYS Performed at [facility], [place] [address], [place], [place] [address]    Report Status 04/17/2017 FINAL  Final         Radiology Studies: No results found.      Scheduled Meds: . amLODipine  5 mg Oral Daily  . calcium acetate  667 mg Oral TID WC  . Chlorhexidine Gluconate Cloth  6 each Topical Q0600  . cloZAPine  200 mg Oral QHS   Followed by  . [START ON 04/18/2017] cloZAPine  300 mg Oral QHS   Followed by  . [START ON 04/19/2017] cloZAPine  400 mg Oral QHS  . darbepoetin (ARANESP) injection - DIALYSIS  100 mcg Intravenous Q Fri-HD  . diclofenac sodium  4 g Topical QID  . diphenhydrAMINE  25 mg Intravenous Once  . doxepin  50 mg Oral QHS  . heparin injection (subcutaneous)  5,000 Units Subcutaneous Q8H  . LORazepam  1 mg Intravenous Once  . QUEtiapine  400 mg Oral QHS  . ranitidine  150 mg Oral BID  . vortioxetine HBr  20 mg Oral QHS   Continuous Infusions: .  ceFAZolin (ANCEF) IV Stopped (04/16/17 2210)     LOS: 12 days    Time spent: 35 minutes.     Elmarie Shiley, MD [facility] Pager [phone]  If 7PM-7AM, please contact night-coverage [email or web address] Password [facility] 04/17/2017, 2:56 PM

## 2017-04-17 NOTE — Plan of Care (Signed)
  Progressing Education: Knowledge of General Education information will improve 04/17/2017 1154 - Progressing by Marquies Wanat, Vertell Limber, RN Health Behavior/Discharge Planning: Ability to manage health-related needs will improve 04/17/2017 1154 - Progressing by Jash Wahlen, Vertell Limber, RN Clinical Measurements: Ability to maintain clinical measurements within normal limits will improve 04/17/2017 1154 - Progressing by Evalee Jefferson, RN Will remain free from infection 04/17/2017 1154 - Progressing by Evalee Jefferson, RN Activity: Risk for activity intolerance will decrease 04/17/2017 1154 - Progressing by Evalee Jefferson, RN Elimination: Will not experience complications related to bowel motility 04/17/2017 1154 - Progressing by Evalee Jefferson, RN Spiritual Needs Ability to function at adequate level 04/17/2017 1154 - Progressing by Sally Menard, Vertell Limber, RN

## 2017-04-17 NOTE — Progress Notes (Signed)
[facility] Progress Note  Subjective:  Another 100 of urine- maybe more she says - - she is still c/o pain- right sided  -    Vitals:   04/17/17 0304 04/17/17 0504 04/17/17 0542 04/17/17 0618  BP: 128/75 123/82    Pulse: (!) 118 (!) 115    Resp: (!) 23 15    Temp:   98.1 F (36.7 C)   TempSrc:   Axillary   SpO2: 94% 96%    Weight:    123.3 kg (271 lb 13.2 oz)  Height:        Inpatient medications: . amLODipine  5 mg Oral Daily  . calcium acetate  667 mg Oral TID WC  . Chlorhexidine Gluconate Cloth  6 each Topical Q0600  . cloZAPine  200 mg Oral QHS   Followed by  . [START ON 04/18/2017] cloZAPine  300 mg Oral QHS   Followed by  . [START ON 04/19/2017] cloZAPine  400 mg Oral QHS  . darbepoetin (ARANESP) injection - DIALYSIS  100 mcg Intravenous Q Fri-HD  . diclofenac sodium  4 g Topical QID  . diphenhydrAMINE  25 mg Intravenous Once  . doxepin  50 mg Oral QHS  . heparin injection (subcutaneous)  5,000 Units Subcutaneous Q8H  . LORazepam  1 mg Intravenous Once  . QUEtiapine  400 mg Oral QHS  . ranitidine  150 mg Oral BID  . vortioxetine HBr  20 mg Oral QHS   .  ceFAZolin (ANCEF) IV Stopped (04/16/17 2210)   acetaminophen, fentaNYL (SUBLIMAZE) injection, hydrALAZINE, ondansetron (ZOFRAN) IV  Exam: Gen obese young AAF,  no distress but mult c/o's Chest occ rhonchi, mostly clear RRR no MRG Abd soft ntnd no mass or ascites +bs obese GU foley cath draining small amts of clear brownish urine MS no joint effusions or deformity Ext  pitting  edema elsewhere LE's Neuro Ox 3, NF   Home meds: - abilify injection monthly / clozapine bid/ doxepin hs/ seroquel xr bid/ trintellix qd / eszopiclone prn - HCTZ 25 qd/ ecasa    UA - yellow, neg Hb/ LE/ nitrite, 5.0 , prot 30, 1.021, 0-5 wbc/ rbc ECHO - mild LVH, o/w normal study CXR 3/8 > Pulmonary vascular congestion may reflect mild fluid overload. Left lower lobe consolidation unchanged. UDS negative, etoh/  salicylate negative, HIV neg   Impression: 1  Acute renal failure - found down, AKI due to ATN from severe rhabdo-  still not making much urine but maybe  More?  Vol overloaded. SP HD  3/8 and 3/9,  3/11,  3/13 and 3/15-will plan for next  HD Monday but check labs first. Monitor UOP and labs with PRN HD and await return of renal fxn- baseline renal function in Feb 2018 was normal.  Jenna Hill now 40 days old - will likely keep in thru Monday-- if needs dialysis past that will need tunneled cath   2  Severe depression/ schizoaffective disorder - on multiple psych meds-  restarted - per primary - requiring Hill sitter 3  Resp failure - resolved- still overloaded- UF as able  4  AMS - resolved 5  HTN - stable on norvasc- goes up at times maybe due to anxiety  6. Anemia- iron stores low- on ferrlecit - started aranesp  100 mcg on 3/15 7. Bones- - last phos 6.2, started phoslo - PTH 132- no meds needed  8. Pain- on right side- if that was the side she was down on could be rhabdo  pain      Jenna Hill  04/17/2017, 10:09 AM   Recent Labs  Lab 04/15/17 0444 04/16/17 0406 04/17/17 0410  NA 129* 132* 131*  K 4.2 4.2 4.6  CL 93* 96* 96*  CO2 22 26 24   GLUCOSE 100* 104* 92  BUN 45* 23* 32*  CREATININE 8.25* 5.67* 7.69*  CALCIUM 8.0* 7.9* 8.1*  PHOS 5.7* 4.3 5.2*   Recent Labs  Lab 04/12/17 0835  04/14/17 0145 04/15/17 0444 04/16/17 0406 04/17/17 0410  AST 152*  --  102*  --   --   --   ALT 158*  --  122*  --   --   --   ALKPHOS 117  --  111  --   --   --   BILITOT 0.9  --  0.6  --   --   --   PROT 5.5*  --  5.2*  --   --   --   ALBUMIN 1.8*   < > 1.8* 2.0* 1.9* 1.9*   < > = values in this interval not displayed.   Recent Labs  Lab 04/12/17 0835 04/13/17 0501  04/15/17 1116 04/16/17 1119 04/17/17 0410  WBC 20.9* 17.4*   < > 24.9* 21.1* 24.2*  NEUTROABS 15.5* 13.6*  --   --   --   --   HGB 10.0* 7.6*   < > 8.5* 8.1* 8.3*  HCT 29.6* 22.2*   < > 25.5* 24.1* 25.2*  MCV  86.3 86.4   < > 87.3 88.3 87.5  PLT 205 152   < > 322 314 356   < > = values in this interval not displayed.   Iron/TIBC/Ferritin/ %Sat    Component Value Date/Time   IRON 31 04/12/2017 0835   TIBC 196 (L) 04/12/2017 0835   FERRITIN 302 04/12/2017 0835   IRONPCTSAT 16 04/12/2017 7793

## 2017-04-18 ENCOUNTER — Inpatient Hospital Stay (HOSPITAL_COMMUNITY): Payer: Medicaid Other

## 2017-04-18 LAB — RENAL FUNCTION PANEL
Albumin: 2 g/dL — ABNORMAL LOW (ref 3.5–5.0)
Anion gap: 13 (ref 5–15)
BUN: 40 mg/dL — AB (ref 6–20)
CO2: 25 mmol/L (ref 22–32)
CREATININE: 9.77 mg/dL — AB (ref 0.44–1.00)
Calcium: 8.3 mg/dL — ABNORMAL LOW (ref 8.9–10.3)
Chloride: 93 mmol/L — ABNORMAL LOW (ref 101–111)
GFR calc Af Amer: 5 mL/min — ABNORMAL LOW (ref >60)
GFR, EST NON AFRICAN AMERICAN: 4 mL/min — AB (ref >60)
GLUCOSE: 96 mg/dL (ref 65–99)
Phosphorus: 5.9 mg/dL — ABNORMAL HIGH (ref 2.5–4.6)
Potassium: 4.3 mmol/L (ref 3.5–5.1)
Sodium: 131 mmol/L — ABNORMAL LOW (ref 135–145)

## 2017-04-18 LAB — CBC
HCT: 20.2 % — ABNORMAL LOW (ref 36.0–46.0)
HCT: 32.7 % — ABNORMAL LOW (ref 36.0–46.0)
Hemoglobin: 6.8 g/dL — CL (ref 12.0–15.0)
Hemoglobin: 10.9 g/dL — ABNORMAL LOW (ref 12.0–15.0)
MCH: 30 pg (ref 26.0–34.0)
MCH: 29.5 pg (ref 26.0–34.0)
MCHC: 33.7 g/dL (ref 30.0–36.0)
MCHC: 33.3 g/dL (ref 30.0–36.0)
MCV: 89 fL (ref 78.0–100.0)
MCV: 88.4 fL (ref 78.0–100.0)
PLATELETS: 300 10*3/uL (ref 150–400)
PLATELETS: 301 10*3/uL (ref 150–400)
RBC: 2.27 MIL/uL — ABNORMAL LOW (ref 3.87–5.11)
RBC: 3.7 MIL/uL — ABNORMAL LOW (ref 3.87–5.11)
RDW: 14.3 % (ref 11.5–15.5)
RDW: 14 % (ref 11.5–15.5)
WBC: 16.6 10*3/uL — AB (ref 4.0–10.5)
WBC: 21 10*3/uL — AB (ref 4.0–10.5)

## 2017-04-18 LAB — HEPATIC FUNCTION PANEL
ALT: 9 U/L — ABNORMAL LOW (ref 14–54)
AST: 43 U/L — AB (ref 15–41)
Albumin: 2 g/dL — ABNORMAL LOW (ref 3.5–5.0)
Alkaline Phosphatase: 75 U/L (ref 38–126)
Total Bilirubin: 0.4 mg/dL (ref 0.3–1.2)
Total Protein: 5.1 g/dL — ABNORMAL LOW (ref 6.5–8.1)

## 2017-04-18 LAB — PREPARE RBC (CROSSMATCH)

## 2017-04-18 LAB — CK: CK TOTAL: 831 U/L — AB (ref 38–234)

## 2017-04-18 LAB — TRIGLYCERIDES: TRIGLYCERIDES: 259 mg/dL — AB (ref <150)

## 2017-04-18 MED ORDER — FENTANYL CITRATE (PF) 100 MCG/2ML IJ SOLN
INTRAMUSCULAR | Status: AC
  Filled 2017-04-18: qty 2

## 2017-04-18 MED ORDER — GEMFIBROZIL 600 MG PO TABS: 600.0000 mg | ORAL_TABLET | Freq: Two times a day (BID) | ORAL | Status: DC

## 2017-04-18 MED ORDER — ACETAMINOPHEN 325 MG PO TABS
ORAL_TABLET | ORAL | Status: AC
  Filled 2017-04-18: qty 2

## 2017-04-18 MED ORDER — SODIUM CHLORIDE 0.9 % IV SOLN: Freq: Once | INTRAVENOUS | Status: DC

## 2017-04-18 MED ORDER — ACETAMINOPHEN 325 MG PO TABS
650.0000 mg | ORAL_TABLET | Freq: Three times a day (TID) | ORAL | Status: DC
  Administered 2017-04-18 – 2017-04-28 (×32): 650 mg via ORAL
  Filled 2017-04-18 (×31): qty 2

## 2017-04-18 MED ORDER — DIPHENHYDRAMINE HCL 50 MG/ML IJ SOLN
INTRAMUSCULAR | Status: AC
  Filled 2017-04-18: qty 1

## 2017-04-18 NOTE — Progress Notes (Signed)
Nutrition Follow-up  DOCUMENTATION CODES:   Morbid obesity  INTERVENTION:  Adjust snacks for patient Monitor for needs, liberalize diet as possible.   NUTRITION DIAGNOSIS:   Inadequate oral intake related to decreased appetite as evidenced by meal completion < 50%. -resolved  GOAL:   Patient will meet greater than or equal to 90% of their needs -progressing  MONITOR:   PO intake, Supplement acceptance  REASON FOR ASSESSMENT:   Consult Enteral/tube feeding initiation and management  ASSESSMENT:   Pt with PMH of HTN, severe psych illness, bipolar 1, schizophrenia, and depression on multiple medications. Pt found down with possible overdose. Pt developed acute kidney injury per MD likely r/t ATN and severe rhabdo.   Spoke with patient at bedside. She has been eating well. PO  Averaging 51% per chart. She had a hamburger last night with leeched potatoes and a popsickle which she ate 100% of.  Last HD was 3/15, being dialyzed today Very taut on right side, non-piting edema.  Intake/Output Summary (Last 24 hours) at 04/18/2017 1536 Last data filed at 04/18/2017 1500 Gross per 24 hour  Intake 540 ml  Output 275 ml  Net 265 ml   Labs reviewed:  Na 131, Phos 5.9 Medications reviewed  Diet Order:  Diet renal with fluid restriction Fluid restriction: 1200 mL Fluid; Room service appropriate? Yes; Fluid consistency: Thin  EDUCATION NEEDS:   No education needs have been identified at this time  Skin:  Skin Assessment: Reviewed RN Assessment  Last BM:  3/9  Height:   Ht Readings from Last 1 Encounters:  04/05/17 5' 6.5" (1.689 m)    Weight:   Wt Readings from Last 1 Encounters:  04/18/17 277 lb 12.5 oz (126 kg)    Ideal Body Weight:  60.23 kg  BMI:  Body mass index is 44.16 kg/m.  Estimated Nutritional Needs:   Kcal:  1900-2100  Protein:  110-132 grams  Fluid:  UOP +1L  Jenna Hill. Jenna Navis, MS, RD LDN Inpatient Clinical Dietitian Pager [phone]

## 2017-04-18 NOTE — Progress Notes (Signed)
Impression: 1 Acute renal failure - found down, AKI due to ATN from severe rhabdo-   SP HD  3/8 and 3/9,  3/11,  3/13 and 3/15.  Has Temp cath still. Oliguric. For HD today. 2 Severe depression/ schizoaffective disorder - on multiple psych meds-  restarted - requiring a sitter 3 Resp failure - resolved 5 HTN - stable on norvasc- goes up at times maybe due to anxiety  6. Anemia-  on ferrlecit - and  aranesp  100 on 3/15, Hgb variation today-recheck on HD.  Subjective: Interval History: Has foley.  Diff with ambulation  Objective: Vital signs in last 24 hours: Temp:  [98.2 F (36.8 C)-98.8 F (37.1 C)] 98.8 F (37.1 C) (03/18 0534) Pulse Rate:  [113-124] 113 (03/18 0534) Resp:  [16-22] 22 (03/17 2109) BP: (134-162)/(68-79) 146/77 (03/18 0534) SpO2:  [98 %-100 %] 98 % (03/18 0534) Weight:  [125.8 kg (277 lb 5.4 oz)] 125.8 kg (277 lb 5.4 oz) (03/18 0430) Weight change: 2.5 kg (5 lb 8.2 oz)  Intake/Output from previous day: 03/17 0701 - 03/18 0700 In: 800 [P.O.:800] Out: 275 [Urine:275] Intake/Output this shift: No intake/output data recorded.  General appearance: alert and cooperative Resp: clear to auscultation bilaterally Chest wall: no tenderness Cardio: regular rate and rhythm, S1, S2 normal, no murmur, click, rub or gallop Extremities: edema RLE edematous 1-2+ and tender and weak  Lab Results: Recent Labs    04/17/17 0410 04/18/17 0423  WBC 24.2* 16.6*  HGB 8.3* 6.8*  HCT 25.2* 20.2*  PLT 356 300   BMET:  Recent Labs    04/17/17 0410 04/18/17 0423  NA 131* 131*  K 4.6 4.3  CL 96* 93*  CO2 24 25  GLUCOSE 92 96  BUN 32* 40*  CREATININE 7.69* 9.77*  CALCIUM 8.1* 8.3*   No results for input(s): PTH in the last 72 hours. Iron Studies: No results for input(s): IRON, TIBC, TRANSFERRIN, FERRITIN in the last 72 hours. Studies/Results: No results found.  Scheduled: . amLODipine  5 mg Oral Daily  . calcium acetate  667 mg Oral TID WC  . Chlorhexidine  Gluconate Cloth  6 each Topical Q0600  . cloZAPine  300 mg Oral QHS   Followed by  . [START ON 04/19/2017] cloZAPine  400 mg Oral QHS  . darbepoetin (ARANESP) injection - DIALYSIS  100 mcg Intravenous Q Fri-HD  . diclofenac sodium  4 g Topical QID  . diphenhydrAMINE  25 mg Intravenous Once  . doxepin  50 mg Oral QHS  . heparin injection (subcutaneous)  5,000 Units Subcutaneous Q8H  . LORazepam  1 mg Intravenous Once  . QUEtiapine  400 mg Oral QHS  . ranitidine  150 mg Oral BID  . vortioxetine HBr  20 mg Oral QHS     LOS: 13 days   [name] 04/18/2017,9:03 AM

## 2017-04-18 NOTE — Progress Notes (Signed)
CRITICAL VALUE ALERT  Critical Value:  Hemoglobin 6.8  Date & Time Notied:  04/18/17; 5:02am2  Provider Notified: Silas Sacramento, NP  Orders Received/Actions taken:  1 unit of RBC ordered

## 2017-04-18 NOTE — Progress Notes (Signed)
Pharmacy Antibiotic Note  Jenna Hill is a 47 y.o. female admitted on 04/05/2017 with rhabdomyelitis.  Pt was found down, thought secondary to drug overdose, she continued to have swelling to R hip and pharmacy was been consulted for Ancef dosing for cellulitis.  Patient with AKI with to rhabdomyolysis, requiring intermittent HD. Most recent HD treatment was on 3/15 (4h @ BFR 400), going again today per notes.  WBC continues to trend down, afebrile.  Plan: Ancef 1g IV q24h- last dose due on 3/19 per discussion with Dr. Dennison Nancy (will complete 7 days) Pharmacy to sign off as no dose change anticipated and stop date entered.  Height: 5' 6.5" (168.9 cm) Weight: 277 lb 5.4 oz (125.8 kg) IBW/kg (Calculated) : 60.45  Temp (24hrs), Avg:98.4 F (36.9 C), Min:98.2 F (36.8 C), Max:98.8 F (37.1 C)  Recent Labs  Lab 04/14/17 0145 04/15/17 0444 04/15/17 1116 04/16/17 0406 04/16/17 1119 04/17/17 0410 04/18/17 0423  WBC 23.8*  --  24.9*  --  21.1* 24.2* 16.6*  CREATININE 5.82* 8.25*  --  5.67*  --  7.69* 9.77*    Estimated Creatinine Clearance: 9.8 mL/min (A) (by C-G formula based on SCr of 9.77 mg/dL (H)).    Allergies  Allergen Reactions  . Bee Venom Anaphylaxis  . Bretylium     Other reaction(s): Seizures  . Codeine Anaphylaxis  . Other Anaphylaxis    bertrillium or bertillium    . Toradol [Ketorolac Tromethamine] Swelling  . Ibuprofen     Raised hives   . Latex Rash    Antimicrobials this admission: Unasyn 3/10 >> 3/15  Ancef  3/13 >> (3/19)  Microbiology results: 3/12 BCx: ngtd 3/11 UCx: insig growth  3/5 MRSA PCR: negative  Thank you for allowing pharmacy to be a part of this patient's care.  Jenna Hill, PharmD, BCPS Clinical Pharmacist Clinical Phone for 04/18/2017 until 3:30pm: [phone] If after 3:30pm, please call main pharmacy at [phone] 04/18/2017 11:05 AM

## 2017-04-18 NOTE — Progress Notes (Addendum)
PROGRESS NOTE    Jenna Hill  ONG:[id number] DOB: July 06, 1970 DOA: 04/05/2017 PCP: Alfonse Spruce, FNP    Brief Narrative by Dr Wynelle Cleveland: " Jenna Hill is a 47 y/o F with h/o bipolar disorder, schizophrenia and HTN who presented the the hospital from home on 3/5 obtunded with hypothermia & tachycardia. Narcan did not help. She was intubated for airway protection. Vomited during intubation. Lactic acid 10.54,  K 2.7, BUN 21, Cr 2.01 CK > 50,000, Lipase 207, ASH 349, ALD 184 CXR >  Vascular congestion. Received 4 days of Unasyn  on admission for possible aspiration Received aggressive IV hydration but renal function continued to worsen and nephrology was consulted.  Extubated on 3/8. Dialysis also started same day.   Patient still requiring HD, she has persistent leukocytosis and vomiting.    Assessment & Plan:   Principal Problem:   Schizoaffective disorder, depressive type ([place]) Active Problems:   Hypertension   Acute encephalopathy   Acute respiratory failure (HCC)   Shock liver   Rhabdomyolysis   Bipolar 1 disorder (HCC)  1-Acute Metabolic encephalopathy; possible related  Medication overdose, renal  failure.  Bipolar, schizoaffective disorder.   Psych consulted.  CT head negative,  APAP and salicylate negative.  UDS negative.  Trintellix resume. Her psychiatric medications; clozapine, Seroquel, doxepin were resume 3-12.  Psych consulted , patient was hearing voices, and to help adjust medications.  Discussed with Dr Elmore Guise; will resume clozapine to 25 mg them tomorrow 50 mg them increment dose 100--200--300-400, to avoid myocarditis.   Evaluated by psych, patient does not need inpatient psychiatric admission.  Restarted on Abilify. IM. And doxepin.  Improving   Tachycardia; hb stable. Fluctuates,.   2-Acute respiratory Failure;  intubated on admission and extubated on 3/8 - resp virus panel negative - resp culture> no growth - LLL consolidation on  CXR -treated with Unasyn.   3-Rhabdomyolysis- severe swelling of right leg CK improving.  Now reuiring HD>  - CT  On 3/9: Moderate subcutaneous edema and fat stranding about the right lateral hip and circumferentially through the thigh, nonspecific, but can be seen with cellulitis. No drainable fluid collection. -  venous duplex of Right leg is negative Report generalized pain. Continue with fentanyl.  Right leg appears more swollen, and tight. She report decreased feeling left toes. Ortho consulted. No concern for compartment syndrome per ortho.   AKI; secondary to Rhabdomyolysis.  Requiring HD. Nephrology following.    Transaminases; shock liver vs rhabdo.  LFT normalized.  [place] with cholelithiasis. Monitor for now, follow LFT>  Hepatitis b negative.   Leukocytosis.  Patient with  Persistent leukocytosis. Ct hip with edema, consider cellulitis.   Continue with ancef to cover for possible cellulitis, day 6/7.  Trending down.   CT abdomen only showed ? Pancreatitis. Lipase negative. She denies abdominal pain/ [place] with cholelithiasis. Need follow up.    Hypertriglyceridemia;  Will need lopid when muscle pain improved. Due to risk for myopathy from lopid.   Anemia; hb range 10--9.  Hb drop to 6. Plan to transfuse 2 units with HD  Hyponatremia. Electrolytes correction with HD.   DVT prophylaxis: Heparin  Code Status: full code.  Family Communication: care discussed with patient.  Disposition Plan: to be determine  Consultants:   Nephrology  CCM    Procedures:   Intubation-Extubation    Antimicrobials: Ampicillin received for 4 days.  Ancef 3-13  Subjective: She denies abdominal pain. She is complaining of leg pain.     Objective:  Vitals:   04/17/17 1413 04/17/17 2109 04/18/17 0430 04/18/17 0534  BP: 134/68 (!) 162/79  (!) 146/77  Pulse: (!) 118 (!) 124  (!) 113  Resp: 16 (!) 22    Temp: 98.2 F (36.8 C) 98.3 F (36.8 C)  98.8 F (37.1 C)  TempSrc:  Oral Oral  Oral  SpO2: 99% 100%  98%  Weight:   125.8 kg (277 lb 5.4 oz)   Height:        Intake/Output Summary (Last 24 hours) at 04/18/2017 1431 Last data filed at 04/18/2017 0400 Gross per 24 hour  Intake 440 ml  Output 275 ml  Net 165 ml   Filed Weights   04/16/17 0500 04/17/17 0618 04/18/17 0430  Weight: 126.5 kg (278 lb 14.1 oz) 123.3 kg (271 lb 13.2 oz) 125.8 kg (277 lb 5.4 oz)    Examination:  General exam: NAD Respiratory system; CTA Cardiovascular system: S 1, S 2 RRR Gastrointestinal system: BS present, soft, nt Central nervous system: non focal.  Extremities: symmetric power, right leg with less edema.  Skin: No rash    Data Reviewed: I have personally reviewed following labs and imaging studies  CBC: Recent Labs  Lab 04/12/17 0835 04/13/17 0501  04/14/17 0145 04/15/17 1116 04/16/17 1119 04/17/17 0410 04/18/17 0423  WBC 20.9* 17.4*   < > 23.8* 24.9* 21.1* 24.2* 16.6*  NEUTROABS 15.5* 13.6*  --   --   --   --   --   --   HGB 10.0* 7.6*   < > 8.8* 8.5* 8.1* 8.3* 6.8*  HCT 29.6* 22.2*   < > 26.6* 25.5* 24.1* 25.2* 20.2*  MCV 86.3 86.4   < > 86.4 87.3 88.3 87.5 89.0  PLT 205 152   < > 243 322 314 356 300   < > = values in this interval not displayed.   Basic Metabolic Panel: Recent Labs  Lab 04/13/17 0501 04/14/17 0145 04/15/17 0444 04/16/17 0406 04/17/17 0410 04/18/17 0423  NA 128* 130* 129* 132* 131* 131*  K 3.9 4.1 4.2 4.2 4.6 4.3  CL 92* 94* 93* 96* 96* 93*  CO2 23 25 22 26 24 25   GLUCOSE 111* 98 100* 104* 92 96  BUN 56* 30* 45* 23* 32* 40*  CREATININE 8.17* 5.82* 8.25* 5.67* 7.69* 9.77*  CALCIUM 8.1* 7.7* 8.0* 7.9* 8.1* 8.3*  PHOS 6.2*  --  5.7* 4.3 5.2* 5.9*   GFR: Estimated Creatinine Clearance: 9.8 mL/min (A) (by C-G formula based on SCr of 9.77 mg/dL (H)). Liver Function Tests: Recent Labs  Lab 04/12/17 0835  04/14/17 0145 04/15/17 0444 04/16/17 0406 04/17/17 0410 04/18/17 0423  AST 152*  --  102*  --   --   --  43*  ALT  158*  --  122*  --   --   --  9*  ALKPHOS 117  --  111  --   --   --  75  BILITOT 0.9  --  0.6  --   --   --  0.4  PROT 5.5*  --  5.2*  --   --   --  5.1*  ALBUMIN 1.8*   < > 1.8* 2.0* 1.9* 1.9* 2.0*  2.0*   < > = values in this interval not displayed.   Recent Labs  Lab 04/14/17 0627  LIPASE 30   No results for input(s): AMMONIA in the last 168 hours. Coagulation Profile: No results for input(s): INR, PROTIME in the last 168  hours. Cardiac Enzymes: Recent Labs  Lab 04/12/17 0835 04/14/17 0145 04/18/17 0423  CKTOTAL 4,558* 3,827* 831*   BNP (last 3 results) No results for input(s): PROBNP in the last 8760 hours. HbA1C: No results for input(s): HGBA1C in the last 72 hours. CBG: Recent Labs  Lab 04/15/17 1854  GLUCAP 106*   Lipid Profile: Recent Labs    04/18/17 0423  TRIG 259*   Thyroid Function Tests: No results for input(s): TSH, T4TOTAL, FREET4, T3FREE, THYROIDAB in the last 72 hours. Anemia Panel: No results for input(s): VITAMINB12, FOLATE, FERRITIN, TIBC, IRON, RETICCTPCT in the last 72 hours. Sepsis Labs: No results for input(s): PROCALCITON, LATICACIDVEN in the last 168 hours.  Recent Results (from the past 240 hour(s))  Culture, Urine     Status: Abnormal   Collection Time: 04/11/17 10:49 PM  Result Value Ref Range Status   Specimen Description URINE, CLEAN CATCH  Final   Special Requests NONE  Final   Culture (A)  Final    <10,000 COLONIES/mL INSIGNIFICANT GROWTH Performed at [facility], [address], [place], [place] [address]    Report Status 04/13/2017 FINAL  Final  Culture, blood (Routine X 2) w Reflex to ID Panel     Status: None   Collection Time: 04/12/17  6:26 PM  Result Value Ref Range Status   Specimen Description BLOOD RIGHT ARM  Final   Special Requests   Final    BOTTLES DRAWN AEROBIC AND ANAEROBIC Blood Culture adequate volume   Culture   Final    NO GROWTH 5 DAYS Performed at [facility], [place] [address],  [place], [place] [address]    Report Status 04/17/2017 FINAL  Final  Culture, blood (Routine X 2) w Reflex to ID Panel     Status: None   Collection Time: 04/12/17  6:40 PM  Result Value Ref Range Status   Specimen Description BLOOD RIGHT HAND  Final   Special Requests IN PEDIATRIC BOTTLE Blood Culture adequate volume  Final   Culture   Final    NO GROWTH 5 DAYS Performed at [facility], [place] [address] [place], [place] [address]    Report Status 04/17/2017 FINAL  Final         Radiology Studies: US Abdomen Limited Ruq  Result Date: 04/18/2017 CLINICAL DATA:  Abnormal transaminase. EXAM: ULTRASOUND ABDOMEN LIMITED RIGHT UPPER QUADRANT COMPARISON:  CT scan of the abdomen dated 04/13/2017 FINDINGS: Gallbladder: Multiple gallstones. Gallbladder wall is not thickened. Negative sonographic Murphy's sign. Common bile duct: Diameter: 3.8 mm, normal. Liver: No focal lesion identified. Within normal limits in parenchymal echogenicity. Portal vein is patent on color Doppler imaging with normal direction of blood flow towards the liver. IMPRESSION: 1. Cholelithiasis. 2. Incidental note made of right pleural effusion. Electronically Signed   By: Lorriane Shire M.D.   On: 04/18/2017 09:29        Scheduled Meds: . diphenhydrAMINE      . acetaminophen  650 mg Oral TID  . amLODipine  5 mg Oral Daily  . calcium acetate  667 mg Oral TID WC  . Chlorhexidine Gluconate Cloth  6 each Topical Q0600  . cloZAPine  300 mg Oral QHS   Followed by  . [START ON 04/19/2017] cloZAPine  400 mg Oral QHS  . darbepoetin (ARANESP) injection - DIALYSIS  100 mcg Intravenous Q Fri-HD  . diclofenac sodium  4 g Topical QID  . doxepin  50 mg Oral QHS  .  gemfibrozil  600 mg Oral BID AC  . heparin injection (subcutaneous)  5,000 Units Subcutaneous Q8H  . LORazepam  1 mg Intravenous Once  . QUEtiapine  400 mg Oral QHS  . ranitidine  150 mg Oral BID  . vortioxetine HBr  20 mg Oral QHS   Continuous Infusions: .  sodium chloride    . sodium chloride    .  ceFAZolin (ANCEF) IV Stopped (04/17/17 2135)     LOS: 13 days    Time spent: 35 minutes.     Elmarie Shiley, MD [facility] Pager [phone]  If 7PM-7AM, please contact night-coverage [email or web address] Password [facility] 04/18/2017, 2:31 PM

## 2017-04-18 NOTE — Progress Notes (Signed)
PT Cancellation Note  Patient Details Name: Zariel Capano MRN: [id number] DOB: 04/09/1970   Cancelled Treatment:    Reason Eval/Treat Not Completed: Medical issues which prohibited therapy. Hgb 6.8. Pt scheduled for transfusion as well as HD today. Will hold PT and re-attempt at later date/time.    Lorriane Shire 04/18/2017, 10:41 AM

## 2017-04-19 ENCOUNTER — Inpatient Hospital Stay (HOSPITAL_COMMUNITY): Payer: Medicaid Other

## 2017-04-19 LAB — BPAM RBC
Blood Product Expiration Date: 201904082359
Blood Product Expiration Date: 201904092359
ISSUE DATE / TIME: 201903181453
ISSUE DATE / TIME: 201903181453
Unit Type and Rh: 5100
Unit Type and Rh: 5100

## 2017-04-19 LAB — RENAL FUNCTION PANEL
ALBUMIN: 2.2 g/dL — AB (ref 3.5–5.0)
Anion gap: 13 (ref 5–15)
BUN: 25 mg/dL — AB (ref 6–20)
CALCIUM: 8.1 mg/dL — AB (ref 8.9–10.3)
CO2: 24 mmol/L (ref 22–32)
Chloride: 94 mmol/L — ABNORMAL LOW (ref 101–111)
Creatinine, Ser: 7.35 mg/dL — ABNORMAL HIGH (ref 0.44–1.00)
GFR calc Af Amer: 7 mL/min — ABNORMAL LOW (ref >60)
GFR, EST NON AFRICAN AMERICAN: 6 mL/min — AB (ref >60)
GLUCOSE: 123 mg/dL — AB (ref 65–99)
PHOSPHORUS: 3.8 mg/dL (ref 2.5–4.6)
POTASSIUM: 3.9 mmol/L (ref 3.5–5.1)
SODIUM: 131 mmol/L — AB (ref 135–145)

## 2017-04-19 LAB — TYPE AND SCREEN
ABO/RH(D): O POS
Antibody Screen: NEGATIVE
Unit division: 0
Unit division: 0

## 2017-04-19 MED ORDER — RANITIDINE HCL 150 MG/10ML PO SYRP
150.0000 mg | ORAL_SOLUTION | Freq: Every day | ORAL | Status: DC
  Filled 2017-04-19 (×2): qty 10

## 2017-04-19 MED ORDER — RANITIDINE HCL 150 MG/10ML PO SYRP: 150.0000 mg | ORAL_SOLUTION | Freq: Every day | ORAL | Status: DC

## 2017-04-19 NOTE — Progress Notes (Signed)
Foley was D/C per MD order. Will continue to monitor. °

## 2017-04-19 NOTE — Progress Notes (Signed)
Impression: 1 Acute renal failure - found down, AKI due to ATN from severe rhabdo-  SP HD 3/8 and 3/9, 3/11, 3/13 and 3/15.  Has Temp cath still. Now Non-Oliguric. Hope to hold on HD. 2 Severe depression/ schizoaffective disorder - on multiple psych meds- restarted - requiring a sitter 3 Resp failure - resolved 5 HTN - stable on norvasc- goes up at times maybe due to anxiety  6. Anemia-  on ferrlecit - and  aranesp s/p PRBCs yesterday  Subjective: Interval History: Now good UOP  Objective: Vital signs in last 24 hours: Temp:  [98 F (36.7 C)-98.8 F (37.1 C)] 98.6 F (37 C) (03/19 0524) Pulse Rate:  [112-120] 120 (03/19 0524) Resp:  [13-20] 20 (03/19 0524) BP: (137-169)/(76-97) 156/93 (03/19 1118) SpO2:  [95 %-100 %] 95 % (03/19 0524) Weight:  [124 kg (273 lb 5.9 oz)-126 kg (277 lb 12.5 oz)] 124 kg (273 lb 5.9 oz) (03/18 1746) Weight change: 0.2 kg (7.1 oz)  Intake/Output from previous day: 03/18 0701 - 03/19 0700 In: 1390 [P.O.:240; Blood:1150] Out: 3275 [Urine:775] Intake/Output this shift: Total I/O In: 120 [P.O.:120] Out: 150 [Emesis/NG output:150]  General appearance: alert and cooperative Chest wall: no tenderness Cardio: regular rate and rhythm, S1, S2 normal, no murmur, click, rub or gallop Extremities: edema 1+  Lab Results: Recent Labs    04/18/17 0423 04/18/17 2056  WBC 16.6* 21.0*  HGB 6.8* 10.9*  HCT 20.2* 32.7*  PLT 300 301   BMET:  Recent Labs    04/18/17 0423 04/19/17 0359  NA 131* 131*  K 4.3 3.9  CL 93* 94*  CO2 25 24  GLUCOSE 96 123*  BUN 40* 25*  CREATININE 9.77* 7.35*  CALCIUM 8.3* 8.1*   No results for input(s): PTH in the last 72 hours. Iron Studies: No results for input(s): IRON, TIBC, TRANSFERRIN, FERRITIN in the last 72 hours. Studies/Results: Dg Abd Portable 1v  Result Date: 04/19/2017 CLINICAL DATA:  Abdominal pain. Vomiting and diarrhea. Abdominal tenderness. EXAM: PORTABLE ABDOMEN - 1 VIEW COMPARISON:  CT  04/13/2017 FINDINGS: No bowel dilatation to suggest obstruction. No evidence of free air on single supine view. Right lateral abdomen is excluded from the field of view. IMPRESSION: Nonobstructive bowel gas pattern. Right lateral abdomen excluded from the field of view. Electronically Signed   By: Jeb Levering M.D.   On: 04/19/2017 03:36   US Abdomen Limited Ruq  Result Date: 04/18/2017 CLINICAL DATA:  Abnormal transaminase. EXAM: ULTRASOUND ABDOMEN LIMITED RIGHT UPPER QUADRANT COMPARISON:  CT scan of the abdomen dated 04/13/2017 FINDINGS: Gallbladder: Multiple gallstones. Gallbladder wall is not thickened. Negative sonographic Murphy's sign. Common bile duct: Diameter: 3.8 mm, normal. Liver: No focal lesion identified. Within normal limits in parenchymal echogenicity. Portal vein is patent on color Doppler imaging with normal direction of blood flow towards the liver. IMPRESSION: 1. Cholelithiasis. 2. Incidental note made of right pleural effusion. Electronically Signed   By: Lorriane Shire M.D.   On: 04/18/2017 09:29    Scheduled: . acetaminophen  650 mg Oral TID  . amLODipine  5 mg Oral Daily  . calcium acetate  667 mg Oral TID WC  . Chlorhexidine Gluconate Cloth  6 each Topical Q0600  . cloZAPine  400 mg Oral QHS  . darbepoetin (ARANESP) injection - DIALYSIS  100 mcg Intravenous Q Fri-HD  . diclofenac sodium  4 g Topical QID  . doxepin  50 mg Oral QHS  . heparin injection (subcutaneous)  5,000 Units Subcutaneous Q8H  .  LORazepam  1 mg Intravenous Once  . QUEtiapine  400 mg Oral QHS  . ranitidine  150 mg Oral BID  . vortioxetine HBr  20 mg Oral QHS       LOS: 14 days   [name] 04/19/2017,11:21 AM

## 2017-04-19 NOTE — Progress Notes (Signed)
I met with pt at bedside as she sits in chair after working with therapy. Noted HD on hold. Pt continues to receive fentanyl IV q 3 hrs for pain. I discussed with pt the need to transition to po pain med control before pursuing an inpt rehab/CIR admit. I will follow her progress and am hopeful for bed available to admit when pt medically ready for d/c. [phone]

## 2017-04-19 NOTE — PMR Pre-admission (Signed)
PMR Admission Coordinator Pre-Admission Assessment  Patient: Jenna Hill is an 47 y.o., female MRN: [id number] DOB: 07-16-1970 Height: 5' 6.5" (168.9 cm) Weight: 123.7 kg (272 lb 12.8 oz)              Insurance Information  PRIMARY: Medicaid [place] Access      Policy#: [id number]      Subscriber: pt Benefits:  Phone #: [phone]     Name: 04/19/2017 Eff. Date: active 04/19/2017 CIR: 100%        Medicaid Application Date:       Case Manager:  Disability Application Date:       Case Worker:  Disabled due to psych issues   Emergency Contact Information Contact Information    Name Relation Home Work [place] Mother [phone]  [phone]   [place] Form [phone]     Copes, Langley Gauss Relative   [phone]     Current Medical History  Patient Admitting Diagnosis: debility secondary to rhabdo/encephalopathy  History of Present Illness:  HPI: Jenna Hill is a 47 y.o. female with history of bipolar disorder, schizophrenia, HTN , history of overdose/suicide attempts who was admitted after being found down on 04/05/17. Patient was obtunded, with dried vomitus on cheeks, hypothermic, tachycardic and had AKI. UDS negative and encephalopathy felt to be due to suicide attempt. She was intubated for airway protection and started on  IV Unasyn for aspiration PNA and IVF for  AKI with rhabdomyolysis. She continued to have worsening of renal status requiring HD which began on 04/08/17. She continues to have volume overload with oliguria. .  She tolerated extubation on 3/8.   She has been on hemodialysis with some hope for renal function recovery. She has been having leukocytosis of unclear etiology. Receiving Unasyn for presumed cellulitis of RLE. CT of hip showed edema vs infection. Patient was evaluated by ortho and there was no concern for compartment syndrome. Felt due to muscle ischemia likely from lying on it for some time before found down. Recommended  elevation along with compression with thigh high TED's or ace wrap. Also use a CAM boot when ambulating to give her ankle some stability. She could also wear at all times or get a separate PR AFO for non ambulatory periods to prevent foot drop.   She has had nausea presumed related to uremia. CT of abdomen with some concern for pancreatitis, but pt denies abdominal pain. Pt has gallstone by [place], no evidence of cholecystitis. Out pt follow up with GI and or Surgery.  Psych consulted and saw pt on 04/15/2017 related to medication overdose. Patient was hearing voices and for med management,ent issues. Discussed with Dr. Elmore Guise; resume clozapine in incremental doses. Patient does not need inpatient psychiatric admission. Restarted on Abilify IM and doxepin.  Pain management needed with RLE pain and pt on fentanyl prn IV. Patient with allergies and intolerance to tramadol, percocet, Vicodin, and need to avoid NSAID due to renal failure. Transitioning to po pain meds on 04/20/17.  Nephrology following labs with last Hemodialysis 04/21/17. Creatinine has felt to have plateau with good urine output now. Hopeful to remove temporary dialysis catheter on Monday. NO indications for dialysis at this time.      Past Medical History  Past Medical History:  Diagnosis Date  . Bipolar 1 disorder ([place])   . Hypertension   . Schizophrenia ([place])     Family History  family history includes Healthy in her father, mother, and sister.  Prior Rehab/Hospitalizations:  Has the  patient had major surgery during 100 days prior to admission? No  Current Medications   Current Facility-Administered Medications:  .  0.9 %  sodium chloride infusion, , Intravenous, Once, Bodenheimer, Charles A, NP .  0.9 %  sodium chloride infusion, , Intravenous, Once, Regalado, Belkys A, MD .  acetaminophen (TYLENOL) tablet 650 mg, 650 mg, Oral, Q6H PRN, Ollis, Brandi L, NP, 650 mg at 04/19/17 0531 .  acetaminophen (TYLENOL) tablet 650  mg, 650 mg, Oral, TID, Regalado, Belkys A, MD, 650 mg at 04/20/17 0911 .  amLODipine (NORVASC) tablet 5 mg, 5 mg, Oral, Daily, Mannam, Praveen, MD, 5 mg at 04/19/17 0957 .  calcium acetate (PHOSLO) capsule 667 mg, 667 mg, Oral, TID WC, Corliss Parish, MD, 667 mg at 04/20/17 1229 .  Chlorhexidine Gluconate Cloth 2 % PADS 6 each, 6 each, Topical, Q0600, Mannam, Praveen, MD, 6 each at 04/18/17 0436 .  [COMPLETED] cloZAPine (CLOZARIL) tablet 50 mg, 50 mg, Oral, QHS, 50 mg at 04/15/17 2225 **FOLLOWED BY** [COMPLETED] cloZAPine (CLOZARIL) tablet 100 mg, 100 mg, Oral, QHS, 100 mg at 04/16/17 2141 **FOLLOWED BY** [COMPLETED] cloZAPine (CLOZARIL) tablet 200 mg, 200 mg, Oral, QHS, 200 mg at 04/17/17 2104 **FOLLOWED BY** [COMPLETED] cloZAPine (CLOZARIL) tablet 300 mg, 300 mg, Oral, QHS, 300 mg at 04/18/17 2116 **FOLLOWED BY** cloZAPine (CLOZARIL) tablet 400 mg, 400 mg, Oral, QHS, Regalado, Belkys A, MD, 400 mg at 04/19/17 2023 .  Darbepoetin Alfa (ARANESP) injection 100 mcg, 100 mcg, Intravenous, Q Fri-HD, Corliss Parish, MD .  diclofenac sodium (VOLTAREN) 1 % transdermal gel 4 g, 4 g, Topical, QID, Rizwan, Saima, MD, 4 g at 04/20/17 1230 .  doxepin (SINEQUAN) capsule 50 mg, 50 mg, Oral, QHS, Faythe Dingwall, DO, 50 mg at 04/19/17 2024 .  fentaNYL (DURAGESIC - dosed mcg/hr) 25 mcg, 25 mcg, Transdermal, Q48H, Mikhail, Maryann, DO, 25 mcg at 04/20/17 1328 .  heparin injection 5,000 Units, 5,000 Units, Subcutaneous, Q8H, Ollis, Brandi L, NP, 5,000 Units at 04/20/17 0618 .  hydrALAZINE (APRESOLINE) injection 10 mg, 10 mg, Intravenous, Q4H PRN, Kirby-Graham, Karsten Fells, NP, 10 mg at 04/16/17 2214 .  LORazepam (ATIVAN) injection 1 mg, 1 mg, Intravenous, Once, Corliss Parish, MD .  ondansetron [facility]) injection 4 mg, 4 mg, Intravenous, Q6H PRN, Ollis, Brandi L, NP, 4 mg at 04/20/17 0905 .  QUEtiapine (SEROQUEL XR) 24 hr tablet 400 mg, 400 mg, Oral, QHS, Rizwan, Saima, MD, 400 mg at 04/19/17 2023 .   ranitidine (ZANTAC) 150 MG/10ML syrup 150 mg, 150 mg, Oral, QHS, Regalado, Belkys A, MD .  traMADol (ULTRAM) tablet 100 mg, 100 mg, Oral, Q12H PRN, Mikhail, Velta Addison, DO .  vortioxetine HBr (TRINTELLIX) tablet 20 mg, 20 mg, Oral, QHS, Deterding, Guadelupe Sabin, MD, 20 mg at 04/19/17 2024  Patients Current Diet: Diet renal with fluid restriction Fluid restriction: 1200 mL Fluid; Room service appropriate? Yes; Fluid consistency: Thin  Precautions / Restrictions Precautions Precautions: Fall Precaution Comments: No pf/df at R Foot. 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 None of the above  Prior Functional Level Prior  Function Level of Independence: Independent Comments: drove and ran her own errands, but didn't work due to her schizophrenia.  Self Care: Did the patient need help bathing, dressing, using the toilet or eating?  Independent  Indoor Mobility: Did the patient need assistance with walking from room to room (with or without device)? Independent  Stairs: Did the patient need assistance with internal or external stairs (with or without device)? Independent  Functional Cognition: Did the patient need help planning regular tasks such as shopping or remembering to take medications? Independent  Current Functional Level Cognition  Overall Cognitive Status: Within Functional Limits for tasks assessed Orientation Level: Oriented X4 General Comments: low speech volume    Extremity  Assessment (includes Sensation/Coordination)  Upper Extremity Assessment: Generalized weakness  Lower Extremity Assessment: Defer to PT evaluation RLE Deficits / Details: grossly 4- to 4/5 except pf/df 0 to trace/5 RLE Coordination: decreased fine motor LLE Deficits / Details: WFL grossly 4/5    ADLs  Overall ADL's : Needs assistance/impaired Toilet Transfer: Moderate assistance, Stand-pivot, BSC, RW Toilet Transfer Details (indicate cue type and reason): simulated with EOB>recliner. assist to steady and control descent. General ADL Comments: Pt completed bed mobility and SPT to recliner. Educated on UB ROM/light strengthening exercises.     Mobility  Overal bed mobility: Needs Assistance Bed Mobility: Supine to Sit Rolling: Min assist Sidelying to sit: Min assist Supine to sit: Min assist, HOB elevated General bed mobility comments: minA for management of R LE to floor, vc for use of R UE to reach across to bedrail to pull up into sitting    Transfers  Overall transfer level: Needs assistance Equipment used: Rolling walker (2 wheeled) Transfers: Sit to/from Stand Sit to Stand: Mod assist Stand pivot transfers: Min assist General transfer comment: mod A to power up into standing and to steady.     Ambulation / Gait / Stairs / Wheelchair Mobility  Ambulation/Gait Ambulation/Gait assistance: +2 safety/equipment, Min assist Ambulation Distance (Feet): 10 Feet Assistive device: Rolling walker (2 wheeled) Gait Pattern/deviations: Decreased stance time - right, Decreased step length - left General Gait Details: minA for steadying with RW, increased stance time on R LE facilitated by CAM walker, pt fatigues quickly, requiring close chair follow for safety Gait velocity: decreased Gait velocity interpretation: Below normal speed for age/gender    Posture / Balance Dynamic Sitting Balance Sitting balance - Comments: able to move out of midline/BOS, but guarded Balance Overall  balance assessment: Needs assistance Sitting-balance support: No upper extremity supported, Feet supported Sitting balance-Leahy Scale: Fair Sitting balance - Comments: able to move out of midline/BOS, but guarded Standing balance support: Bilateral upper extremity supported Standing balance-Leahy Scale: Poor Standing balance comment: reliant on the RW and external support due to R foot instability    Special needs/care consideration BiPAP/CPAP  N/a CPM  N/a Continuous Drip IV  N/a Dialysis AKI with last dialysis 3/21. Hopeful to remove dialysis catheter on Monday Life Vest  N/a Oxygen  N/a Special Bed  N/a Trach Size  N/a Wound Vac n/a Skin abrasion right thigh with blister; moisture associated skin damage to perineum and thighs Bowel mgmt: continent LBM  04/27/2017 Bladder mgmt: continent; now non-oliguric; need accurate intake and output measurements Diabetic mgmt  N/a Encouragement needed for patient to remain out of bed during the day for full participation in [place]: Alone  Lives With: Alone Available Help at Discharge: Friend(s), Available PRN/intermittently Type of Home: [place]:  One level Home Access: Stairs to enter Entrance Stairs-Rails: None Entrance Stairs-Number of Steps: 3 Bathroom Shower/Tub: Optometrist: Yes How Accessible: Accessible via walker Home Care Services: No Additional Comments: friend works 7 pm until 7 am and can check in on her  Discharge Living Setting Plans for Discharge Living Setting: Apartment, Alone Type of Home at Discharge: Apartment Discharge Home Layout: One level Discharge Home Access: Stairs to enter Entrance Stairs-Rails: None Entrance Stairs-Number of Steps: 3 Discharge Bathroom Shower/Tub: Tub/shower unit, Curtain Discharge Bathroom Toilet: Standard Discharge Bathroom Accessibility: Yes How Accessible: Accessible  via walker Does the patient have any problems obtaining your medications?: No  Social/Family/Support Systems Contact Information: Mom and local friend, Mom in [place] Anticipated Caregiver: friends prn Anticipated Caregiver's Contact Information: see above Ability/Limitations of Caregiver: friends work so intermittent assist only Caregiver Availability: Intermittent Discharge Plan Discussed with Primary Caregiver: Yes Is Caregiver In Agreement with Plan?: Yes Does Caregiver/Family have Issues with Lodging/Transportation while Pt is in Rehab?: No  Goals/Additional Needs Patient/Family Goal for Rehab: Mod I wiht PT, OT, and SLP Expected length of stay: ELOS 10- 15 days Pt/Family Agrees to Admission and willing to participate: Yes Program Orientation Provided & Reviewed with Pt/Caregiver Including Roles  & Responsibilities: Yes  Barriers to Discharge: Decreased caregiver support  Psychiatry cleared her 04/15/2017 for not a suicide attempt.   Decrease burden of Care through IP rehab admission: n/a  Possible need for SNF placement upon discharge:not anticipated  Patient Condition: This patient's medical and functional status has changed since the consult dated: 04/12/2017 in which the Rehabilitation Physician determined and documented that the patient's condition is appropriate for intensive rehabilitative care in an inpatient rehabilitation facility. See "History of Present Illness" (above) for medical update. Functional changes are: min assist. Patient's medical and functional status update has been discussed with the Rehabilitation physician and patient remains appropriate for inpatient rehabilitation. Will admit to inpatient rehab today.  Preadmission Screen Completed By:  Cleatrice Burke, 04/20/2017 2:05 PM ______________________________________________________________________   Discussed status with Dr. Naaman Plummer on 04/29/2017 at 1306 and received telephone approval for admission  today.  Admission Coordinator:  Cleatrice Burke, [id number] Sudie Grumbling 04/29/2017

## 2017-04-19 NOTE — Progress Notes (Signed)
Patient didn't void after the Foley was removed at 12:00 o'clock. At bladder scan there were approximately 117 cc urine. MD made aware. Will continue to monitor.

## 2017-04-19 NOTE — Progress Notes (Addendum)
Called to room by patient because she was vomiting. Pt denies nausea or abdominal pain, says her vomiting began suddenly. Pt also states she feels like there is a "stone in her stomach" that she "could move if she pushed on it". Denies tenderness upon palpation. Pt stomach is taut to the touch, but no bulge or protrusion noted. Paged MD, STAT xray ordered. Will continue to monitor.

## 2017-04-19 NOTE — Progress Notes (Signed)
Physical Therapy Treatment Patient Details Name: Jenna Hill MRN: [id number] DOB: 09-30-1970 Today's Date: 04/19/2017    History of Present Illness pt is a 47 y/o female with pmh significant for schizophrenia, HTN, bipolar d/o and attempted suicide admitted with AMS and hypothermia, suspected intentional OD of psych medication.  Treating for aspiration and sepsis.  Extubated 3/8.    PT Comments    Pt is making slow progress towards her goals, however she is still limited in her safe mobility by decreased strength and sensation in her R LE. Pt is currently minA for bed mobility with HOB elevated, modA for transfers and minAx2 for ambulation of 10 feet with RW. Discharge plans continue to remain appropriate at this time. PT will continue to follow acutely until d/c.   Follow Up Recommendations  CIR     Equipment Recommendations  Rolling walker with 5" wheels;3in1 (PT)    Recommendations for Other Services Rehab consult     Precautions / Restrictions Precautions Precautions: Fall Precaution Comments: No pf/df at R Foot. Required Braces or Orthoses: Other Brace/Splint(CAM walker for maintaining dorsiflexion) Restrictions Weight Bearing Restrictions: No    Mobility  Bed Mobility Overal bed mobility: Needs Assistance Bed Mobility: Supine to Sit     Supine to sit: Min assist;HOB elevated     General bed mobility comments: minA for management of R LE to floor, vc for use of R UE to reach across to bedrail to pull up into sitting  Transfers Overall transfer level: Needs assistance Equipment used: Rolling walker (2 wheeled) Transfers: Sit to/from Stand Sit to Stand: Mod assist         General transfer comment: mod A to power up into standing and to steady.   Ambulation/Gait Ambulation/Gait assistance: +2 safety/equipment;Min assist Ambulation Distance (Feet): 10 Feet Assistive device: Rolling walker (2 wheeled) Gait Pattern/deviations: Decreased stance time -  right;Decreased step length - left Gait velocity: decreased Gait velocity interpretation: Below normal speed for age/gender General Gait Details: minA for steadying with RW, increased stance time on R LE facilitated by CAM walker, pt fatigues quickly, requiring close chair follow for safety        Balance Overall balance assessment: Needs assistance Sitting-balance support: No upper extremity supported;Feet supported Sitting balance-Leahy Scale: Fair     Standing balance support: Bilateral upper extremity supported Standing balance-Leahy Scale: Poor Standing balance comment: reliant on the RW and external support due to R foot instability                            Cognition Arousal/Alertness: Lethargic Behavior During Therapy: Flat affect Overall Cognitive Status: Within Functional Limits for tasks assessed                                         Pertinent Vitals/Pain Pain Assessment: 0-10 Pain Score: 8  Pain Location: R thigh and lower leg Pain Descriptors / Indicators: Aching Pain Intervention(s): Limited activity within patient's tolerance;Monitored during session;Repositioned     PT Goals (current goals can now be found in the care plan section) Acute Rehab PT Goals Patient Stated Goal: pt didn't state any specific goal, but wants to be able to live alone. PT Goal Formulation: With patient Time For Goal Achievement: 04/22/17 Potential to Achieve Goals: Good Progress towards PT goals: Progressing toward goals    Frequency    Min  3X/week      PT Plan Current plan remains appropriate    AM-PAC PT "6 Clicks" Daily Activity  Outcome Measure  Difficulty turning over in bed (including adjusting bedclothes, sheets and blankets)?: Unable Difficulty moving from lying on back to sitting on the side of the bed? : Unable Difficulty sitting down on and standing up from a chair with arms (e.g., wheelchair, bedside commode, etc,.)?:  Unable Help needed moving to and from a bed to chair (including a wheelchair)?: A Lot Help needed walking in hospital room?: A Lot Help needed climbing 3-5 steps with a railing? : Total 6 Click Score: 8    End of Session Equipment Utilized During Treatment: Gait belt Activity Tolerance: Patient limited by lethargy Patient left: in chair;with call bell/phone within reach;with chair alarm set Nurse Communication: Mobility status PT Visit Diagnosis: Unsteadiness on feet (R26.81);Other abnormalities of gait and mobility (R26.89);Muscle weakness (generalized) (M62.81);Other symptoms and signs involving the nervous system (P79.480)     Time: 1655-3748 PT Time Calculation (min) (ACUTE ONLY): 15 min  Charges:  $Gait Training: 8-22 mins                    G Codes:       Jenna Hill B. Migdalia Dk PT, DPT Acute Rehabilitation  [phone] Pager [phone]     [place] 04/19/2017, 11:51 AM

## 2017-04-19 NOTE — Progress Notes (Signed)
PROGRESS NOTE    Jenna Hill  XAJ:[id number] DOB: [birth year] DOA: 04/05/2017 PCP: Alfonse Spruce, FNP    Brief Narrative by Dr Wynelle Cleveland: " Jenna Hill is a 47 y/o F with h/o bipolar disorder, schizophrenia and HTN who presented the the hospital from home on 3/5 obtunded with hypothermia & tachycardia. Narcan did not help. She was intubated for airway protection. Vomited during intubation.Lactic acid 10.54,  K 2.7, BUN 21, Cr 2.01 CK > 50,000, Lipase 207, ASH 349, ALD 184 CXR >  Vascular congestion. Received 4 days of Unasyn  on admission for possible aspiration Received aggressive IV hydration but renal function continued to worsen and nephrology was consulted.  Extubated on 3/8. Dialysis also started same day.   She has been on HD, there some hope for renal function recovery. She has been having leukocytosis of unclear etiology. She has been receiving Unasyn to cover for presume cellulitis of right lower extremity. She had CT hip that showed edema vs infection. She was evaluated by ortho and there was not concern for compartment syndrome.   She was having some nausea, presume related to uremia. She had CT abdomen with some concern with pancreatitis, but patient denies abdominal pain. On initial presentation lipase was elevated , LFT elevated but Alkaline phosphatase was normal. She had gallstone by [place], no evidence of cholecystitis. She will need to follow up with Gi and or Surgery at some point.       Assessment & Plan:   Principal Problem:   Schizoaffective disorder, depressive type ([place]) Active Problems:   Hypertension   Acute encephalopathy   Acute respiratory failure (HCC)   Shock liver   Rhabdomyolysis   Bipolar 1 disorder (HCC)  1-Acute Metabolic encephalopathy; possible related  Medication overdose, renal  failure.  Bipolar, schizoaffective disorder.  Psych consulted.  CT head negative,  APAP and salicylate negative. UDS negative.  Trintellix resume. Her  psychiatric medications; clozapine, Seroquel, doxepin were resume 3-12.  Psych consulted , patient was hearing voices, and to help adjust medications.  Discussed with Dr Elmore Guise; resume clozapine to 25 mg them tomorrow 50 mg them increment dose 100--200--300-400, to avoid myocarditis.   Evaluated by psych, patient does not need inpatient psychiatric admission.  Restarted on Abilify IM. And doxepin.  Improving   Tachycardia; hb stable. Fluctuates,.   2-Acute respiratory Failure;  intubated on admission and extubated on 3/8 - resp virus panel negative - resp culture> no growth - LLL consolidation on CXR -Treated with Unasyn.   3-Rhabdomyolysis- severe swelling of right leg CK improving.  Now reuiring HD>  - CT  On 3/9: Moderate subcutaneous edema and fat stranding about the right lateral hip and circumferentially through the thigh, nonspecific, but can be seen with cellulitis. No drainable fluid collection. -  venous duplex of Right leg is negative Report generalized pain. Continue with fentanyl PRN. She has allergies and intolerance to tramadol, percocet, Vicodin, would avoid NSAID due to renal failure.  Right leg was notice to be more swollen, and tight. She report decreased feeling left toes. Ortho consulted. No concern for compartment syndrome per ortho.   AKI; secondary to Rhabdomyolysis.  Requiring HD. Nephrology following.  Urine out put improving. 775 cc yesterday.   Transaminases; shock liver vs rhabdo.  LFT normalized.  [place] with cholelithiasis. Monitor for now, follow LFT>  Hepatitis b negative.   Leukocytosis.  Patient with  Persistent leukocytosis. Ct hip with edema, consider cellulitis.   Continue with ancef to cover for possible cellulitis,  day 7/7.  WBC fluctuates.  CT abdomen negative for abscess. [place] negative for cholecystitis. Blood culture negative, urine culture insignificant growth.  In reviewing records she had WBC at 15--19 four years ago. She will need  follow up with hematologist   CT abdomen  showed ? Pancreatitis. Repeated Lipase negative. She denies abdominal pain/ [place] with cholelithiasis.  Need follow up with surgery     Hypertriglyceridemia;  Will need lopid when muscle pain improved. Due to risk for myopathy from lopid.   Anemia; hb range 10--9.  Hb drop to 6. Received 2 units 3-18  Hyponatremia. Electrolytes correction with HD.   DVT prophylaxis: Heparin  Code Status: full code.  Family Communication: care discussed with patient.  Disposition Plan: to be determine  Consultants:   Nephrology  CCM    Procedures:   Intubation-Extubation    Antimicrobials: Ampicillin received for 4 days.  Ancef 3-13  Subjective: Still complaining of lefg pain. Denies abdominal pain.    Objective: Vitals:   04/18/17 2118 04/19/17 0524 04/19/17 0957 04/19/17 1118  BP: 137/76 (!) 169/86 (!) 160/82 (!) 156/93  Pulse: (!) 120 (!) 120    Resp: 13 20    Temp: 98.3 F (36.8 C) 98.6 F (37 C)    TempSrc: Oral Oral    SpO2: 97% 95%    Weight:      Height:        Intake/Output Summary (Last 24 hours) at 04/19/2017 1424 Last data filed at 04/19/2017 1145 Gross per 24 hour  Intake 1270 ml  Output 3725 ml  Net -2455 ml   Filed Weights   04/18/17 0430 04/18/17 1346 04/18/17 1746  Weight: 125.8 kg (277 lb 5.4 oz) 126 kg (277 lb 12.5 oz) 124 kg (273 lb 5.9 oz)    Examination:  General exam: NAD Respiratory system; CTA Cardiovascular system: S 1, S 2 RRR Gastrointestinal system: BS present, soft, nt Central nervous system: Non focal.  Extremities: symmetric power, right leg with less edema.  Skin: No rash    Data Reviewed: I have personally reviewed following labs and imaging studies  CBC: Recent Labs  Lab 04/13/17 0501  04/15/17 1116 04/16/17 1119 04/17/17 0410 04/18/17 0423 04/18/17 2056  WBC 17.4*   < > 24.9* 21.1* 24.2* 16.6* 21.0*  NEUTROABS 13.6*  --   --   --   --   --   --   HGB 7.6*   < > 8.5* 8.1*  8.3* 6.8* 10.9*  HCT 22.2*   < > 25.5* 24.1* 25.2* 20.2* 32.7*  MCV 86.4   < > 87.3 88.3 87.5 89.0 88.4  PLT 152   < > 322 314 356 300 301   < > = values in this interval not displayed.   Basic Metabolic Panel: Recent Labs  Lab 04/15/17 0444 04/16/17 0406 04/17/17 0410 04/18/17 0423 04/19/17 0359  NA 129* 132* 131* 131* 131*  K 4.2 4.2 4.6 4.3 3.9  CL 93* 96* 96* 93* 94*  CO2 22 26 24 25 24   GLUCOSE 100* 104* 92 96 123*  BUN 45* 23* 32* 40* 25*  CREATININE 8.25* 5.67* 7.69* 9.77* 7.35*  CALCIUM 8.0* 7.9* 8.1* 8.3* 8.1*  PHOS 5.7* 4.3 5.2* 5.9* 3.8   GFR: Estimated Creatinine Clearance: 13 mL/min (A) (by C-G formula based on SCr of 7.35 mg/dL (H)). Liver Function Tests: Recent Labs  Lab 04/14/17 0145 04/15/17 0444 04/16/17 0406 04/17/17 0410 04/18/17 0423 04/19/17 0359  AST 102*  --   --   --  43*  --   ALT 122*  --   --   --  9*  --   ALKPHOS 111  --   --   --  75  --   BILITOT 0.6  --   --   --  0.4  --   PROT 5.2*  --   --   --  5.1*  --   ALBUMIN 1.8* 2.0* 1.9* 1.9* 2.0*  2.0* 2.2*   Recent Labs  Lab 04/14/17 0627  LIPASE 30   No results for input(s): AMMONIA in the last 168 hours. Coagulation Profile: No results for input(s): INR, PROTIME in the last 168 hours. Cardiac Enzymes: Recent Labs  Lab 04/14/17 0145 04/18/17 0423  CKTOTAL 3,827* 831*   BNP (last 3 results) No results for input(s): PROBNP in the last 8760 hours. HbA1C: No results for input(s): HGBA1C in the last 72 hours. CBG: Recent Labs  Lab 04/15/17 1854  GLUCAP 106*   Lipid Profile: Recent Labs    04/18/17 0423  TRIG 259*   Thyroid Function Tests: No results for input(s): TSH, T4TOTAL, FREET4, T3FREE, THYROIDAB in the last 72 hours. Anemia Panel: No results for input(s): VITAMINB12, FOLATE, FERRITIN, TIBC, IRON, RETICCTPCT in the last 72 hours. Sepsis Labs: No results for input(s): PROCALCITON, LATICACIDVEN in the last 168 hours.  Recent Results (from the past 240  hour(s))  Culture, Urine     Status: Abnormal   Collection Time: 04/11/17 10:49 PM  Result Value Ref Range Status   Specimen Description URINE, CLEAN CATCH  Final   Special Requests NONE  Final   Culture (A)  Final    <10,000 COLONIES/mL INSIGNIFICANT GROWTH Performed at [facility], [address], [place], [place] [address]    Report Status 04/13/2017 FINAL  Final  Culture, blood (Routine X 2) w Reflex to ID Panel     Status: None   Collection Time: 04/12/17  6:26 PM  Result Value Ref Range Status   Specimen Description BLOOD RIGHT ARM  Final   Special Requests   Final    BOTTLES DRAWN AEROBIC AND ANAEROBIC Blood Culture adequate volume   Culture   Final    NO GROWTH 5 DAYS Performed at [facility], [place] [address], [place], [place] [address]    Report Status 04/17/2017 FINAL  Final  Culture, blood (Routine X 2) w Reflex to ID Panel     Status: None   Collection Time: 04/12/17  6:40 PM  Result Value Ref Range Status   Specimen Description BLOOD RIGHT HAND  Final   Special Requests IN PEDIATRIC BOTTLE Blood Culture adequate volume  Final   Culture   Final    NO GROWTH 5 DAYS Performed at [facility], [place] [address] [place], [place] [address]    Report Status 04/17/2017 FINAL  Final         Radiology Studies: Dg Abd Portable 1v  Result Date: 04/19/2017 CLINICAL DATA:  Abdominal pain. Vomiting and diarrhea. Abdominal tenderness. EXAM: PORTABLE ABDOMEN - 1 VIEW COMPARISON:  CT 04/13/2017 FINDINGS: No bowel dilatation to suggest obstruction. No evidence of free air on single supine view. Right lateral abdomen is excluded from the field of view. IMPRESSION: Nonobstructive bowel gas pattern. Right lateral abdomen excluded from the field of view. Electronically Signed   By: Jeb Levering M.D.   On: 04/19/2017 03:36   US Abdomen Limited Ruq  Result Date: 04/18/2017 CLINICAL DATA:  Abnormal transaminase. EXAM:  ULTRASOUND ABDOMEN LIMITED RIGHT UPPER  QUADRANT COMPARISON:  CT scan of the abdomen dated 04/13/2017 FINDINGS: Gallbladder: Multiple gallstones. Gallbladder wall is not thickened. Negative sonographic Murphy's sign. Common bile duct: Diameter: 3.8 mm, normal. Liver: No focal lesion identified. Within normal limits in parenchymal echogenicity. Portal vein is patent on color Doppler imaging with normal direction of blood flow towards the liver. IMPRESSION: 1. Cholelithiasis. 2. Incidental note made of right pleural effusion. Electronically Signed   By: Lorriane Shire M.D.   On: 04/18/2017 09:29        Scheduled Meds: . acetaminophen  650 mg Oral TID  . amLODipine  5 mg Oral Daily  . calcium acetate  667 mg Oral TID WC  . Chlorhexidine Gluconate Cloth  6 each Topical Q0600  . cloZAPine  400 mg Oral QHS  . darbepoetin (ARANESP) injection - DIALYSIS  100 mcg Intravenous Q Fri-HD  . diclofenac sodium  4 g Topical QID  . doxepin  50 mg Oral QHS  . heparin injection (subcutaneous)  5,000 Units Subcutaneous Q8H  . LORazepam  1 mg Intravenous Once  . QUEtiapine  400 mg Oral QHS  . ranitidine  150 mg Oral BID  . vortioxetine HBr  20 mg Oral QHS   Continuous Infusions: . sodium chloride    . sodium chloride    .  ceFAZolin (ANCEF) IV Stopped (04/18/17 2141)     LOS: 14 days    Time spent: 35 minutes.     Elmarie Shiley, MD [facility] Pager [phone]  If 7PM-7AM, please contact night-coverage [email or web address] Password [facility] 04/19/2017, 2:24 PM

## 2017-04-20 LAB — RENAL FUNCTION PANEL
Albumin: 2.3 g/dL — ABNORMAL LOW (ref 3.5–5.0)
Anion gap: 12 (ref 5–15)
BUN: 32 mg/dL — ABNORMAL HIGH (ref 6–20)
CHLORIDE: 96 mmol/L — AB (ref 101–111)
CO2: 25 mmol/L (ref 22–32)
CREATININE: 8.97 mg/dL — AB (ref 0.44–1.00)
Calcium: 8.3 mg/dL — ABNORMAL LOW (ref 8.9–10.3)
GFR, EST AFRICAN AMERICAN: 5 mL/min — AB (ref >60)
GFR, EST NON AFRICAN AMERICAN: 5 mL/min — AB (ref >60)
Glucose, Bld: 119 mg/dL — ABNORMAL HIGH (ref 65–99)
PHOSPHORUS: 4.6 mg/dL (ref 2.5–4.6)
Potassium: 4 mmol/L (ref 3.5–5.1)
SODIUM: 133 mmol/L — AB (ref 135–145)

## 2017-04-20 LAB — CBC
HCT: 31.3 % — ABNORMAL LOW (ref 36.0–46.0)
Hemoglobin: 10.4 g/dL — ABNORMAL LOW (ref 12.0–15.0)
MCH: 29.5 pg (ref 26.0–34.0)
MCHC: 33.2 g/dL (ref 30.0–36.0)
MCV: 88.9 fL (ref 78.0–100.0)
PLATELETS: 323 10*3/uL (ref 150–400)
RBC: 3.52 MIL/uL — AB (ref 3.87–5.11)
RDW: 14 % (ref 11.5–15.5)
WBC: 17.7 10*3/uL — AB (ref 4.0–10.5)

## 2017-04-20 MED ORDER — HYDROXYZINE HCL 25 MG PO TABS
25.0000 mg | ORAL_TABLET | Freq: Once | ORAL | Status: AC
  Administered 2017-04-20: 25 mg via ORAL

## 2017-04-20 MED ORDER — SODIUM CHLORIDE 0.9 % IV SOLN: 100.0000 mL | INTRAVENOUS | Status: DC | PRN

## 2017-04-20 MED ORDER — OXYCODONE HCL 5 MG PO TABS
5.0000 mg | ORAL_TABLET | ORAL | Status: DC | PRN
  Filled 2017-04-20: qty 2

## 2017-04-20 MED ORDER — LIDOCAINE-PRILOCAINE 2.5-2.5 % EX CREA: 1.0000 "application " | TOPICAL_CREAM | CUTANEOUS | Status: DC | PRN

## 2017-04-20 MED ORDER — HEPARIN SODIUM (PORCINE) 1000 UNIT/ML DIALYSIS: 1000.0000 [IU] | INTRAMUSCULAR | Status: DC | PRN

## 2017-04-20 MED ORDER — HYDROXYZINE HCL 25 MG PO TABS
ORAL_TABLET | ORAL | Status: AC
  Filled 2017-04-20: qty 1

## 2017-04-20 MED ORDER — FENTANYL 12 MCG/HR TD PT72
25.0000 ug | MEDICATED_PATCH | TRANSDERMAL | Status: AC
  Administered 2017-04-20 – 2017-04-22 (×2): 25 ug via TRANSDERMAL
  Filled 2017-04-20 (×3): qty 2

## 2017-04-20 MED ORDER — LIDOCAINE HCL (PF) 1 % IJ SOLN: 5.0000 mL | INTRAMUSCULAR | Status: DC | PRN

## 2017-04-20 MED ORDER — HEPARIN SODIUM (PORCINE) 1000 UNIT/ML DIALYSIS: 20.0000 [IU]/kg | INTRAMUSCULAR | Status: DC | PRN

## 2017-04-20 MED ORDER — ALTEPLASE 2 MG IJ SOLR: 2.0000 mg | Freq: Once | INTRAMUSCULAR | Status: DC | PRN

## 2017-04-20 MED ORDER — TRAMADOL HCL 50 MG PO TABS
100.0000 mg | ORAL_TABLET | Freq: Two times a day (BID) | ORAL | Status: DC | PRN
  Administered 2017-04-22 – 2017-04-29 (×6): 100 mg via ORAL
  Filled 2017-04-20 (×8): qty 2

## 2017-04-20 MED ORDER — PENTAFLUOROPROP-TETRAFLUOROETH EX AERO: 1.0000 "application " | INHALATION_SPRAY | CUTANEOUS | Status: DC | PRN

## 2017-04-20 NOTE — Progress Notes (Signed)
Physical Therapy Treatment Patient Details Name: Jenna Hill MRN: [id number] DOB: 09-Aug-1970 Today's Date: 04/20/2017    History of Present Illness pt is a 47 y/o female with pmh significant for schizophrenia, HTN, bipolar d/o and attempted suicide admitted with AMS and hypothermia, suspected intentional OD of psych medication.  Treating for aspiration and sepsis.  Extubated 3/8.    PT Comments    On arrival to room pt reports her pain as 10/10 but is willing to participate with therapy. Pt with improved mobility since receiving cam boot. She ambulated 10 ft x20 with min A. Pt attempted to ambulate a third time, however pt's menstrual cycle started and pt was returned to room for pt hygiene. Will continue to follow to progress ambulation with cam boot.    Follow Up Recommendations  CIR     Equipment Recommendations  Rolling walker with 5" wheels;3in1 (PT)    Recommendations for Other Services Rehab consult     Precautions / Restrictions Precautions Precautions: Fall Precaution Comments: No pf/df at R Foot. Required Braces or Orthoses: Other Brace/Splint(CAM walker for maintaining dorsiflexion) Restrictions Weight Bearing Restrictions: No    Mobility  Bed Mobility Overal bed mobility: Needs Assistance Bed Mobility: Supine to Sit;Rolling;Sit to Supine Rolling: Modified independent (Device/Increase time)   Supine to sit: Min assist;HOB elevated Sit to supine: Min assist   General bed mobility comments: Min A for R LE managment and to elevate trunk. VC for technique. Pt able to roll with use of bed rails no assist or cueing.  Transfers Overall transfer level: Needs assistance Equipment used: Rolling walker (2 wheeled) Transfers: Sit to/from Stand Sit to Stand: Mod assist;Min assist         General transfer comment: Mod A to power up from EOB, min A to rise from recliner with use of arm rests.   Ambulation/Gait Ambulation/Gait assistance: Min assist;+2  safety/equipment Ambulation Distance (Feet): 20 Feet(21ft x2) Assistive device: Rolling walker (2 wheeled) Gait Pattern/deviations: Decreased stance time - right;Decreased step length - left Gait velocity: decreased Gait velocity interpretation: Below normal speed for age/gender General Gait Details: Min A to steady, pt fatigues quickly, requiring chair to follow closely behind. She ambulated 60ft x2, and was willing to continue however, pt menstrual cycle started and had pt sit to prevent prevent bodily fluids dripping on floor.   Stairs            Wheelchair Mobility    Modified Rankin (Stroke Patients Only)       Balance Overall balance assessment: Needs assistance Sitting-balance support: No upper extremity supported;Feet supported Sitting balance-Leahy Scale: Fair Sitting balance - Comments: able to move out of midline/BOS, but guarded   Standing balance support: Bilateral upper extremity supported Standing balance-Leahy Scale: Poor Standing balance comment: reliant on the RW and external support due to R foot instability                            Cognition Arousal/Alertness: Awake/alert Behavior During Therapy: WFL for tasks assessed/performed Overall Cognitive Status: Within Functional Limits for tasks assessed                                        Exercises      General Comments General comments (skin integrity, edema, etc.): HR in 140's during ambulation and 120's at rest      Pertinent  Vitals/Pain Pain Assessment: 0-10 Pain Score: 10-Worst pain ever Pain Location: R thigh and lower leg Pain Descriptors / Indicators: Aching Pain Intervention(s): Monitored during session;Limited activity within patient's tolerance    Home Living                      Prior Function            PT Goals (current goals can now be found in the care plan section) Acute Rehab PT Goals Patient Stated Goal: pt didn't state any  specific goal, but wants to be able to live alone. PT Goal Formulation: With patient Time For Goal Achievement: 04/22/17 Potential to Achieve Goals: Good Progress towards PT goals: Progressing toward goals    Frequency    Min 3X/week      PT Plan Current plan remains appropriate    Co-evaluation              AM-PAC PT "6 Clicks" Daily Activity  Outcome Measure  Difficulty turning over in bed (including adjusting bedclothes, sheets and blankets)?: A Little Difficulty moving from lying on back to sitting on the side of the bed? : Unable Difficulty sitting down on and standing up from a chair with arms (e.g., wheelchair, bedside commode, etc,.)?: Unable Help needed moving to and from a bed to chair (including a wheelchair)?: A Little Help needed walking in hospital room?: A Little Help needed climbing 3-5 steps with a railing? : Total 6 Click Score: 12    End of Session Equipment Utilized During Treatment: Gait belt Activity Tolerance: Patient tolerated treatment well Patient left: with call bell/phone within reach;in bed Nurse Communication: Mobility status;Other (comment)(manustrual cycle start, need for pads) PT Visit Diagnosis: Unsteadiness on feet (R26.81);Other abnormalities of gait and mobility (R26.89);Muscle weakness (generalized) (M62.81);Other symptoms and signs involving the nervous system (R29.898)     Time: 9147-8295 PT Time Calculation (min) (ACUTE ONLY): 34 min  Charges:  $Gait Training: 8-22 mins $Therapeutic Activity: 8-22 mins                    G Codes:      Benjiman Core, [place] Pager [phone] Acute Rehab  Allena Katz 04/20/2017, 2:43 PM

## 2017-04-20 NOTE — Progress Notes (Signed)
Occupational Therapy Treatment Patient Details Name: Jenna Hill MRN: [id number] DOB: 04/20/1970 Today's Date: 04/20/2017    History of present illness pt is a 47 y/o female with pmh significant for schizophrenia, HTN, bipolar d/o and attempted suicide admitted with AMS and hypothermia, suspected intentional OD of psych medication.  Treating for aspiration and sepsis.  Extubated 3/8.   OT comments  Pt able to participate in OT in the afternoon; pain had decreased.  Brother arrived for a visit and pt wanted to end therapy session  Follow Up Recommendations  CIR    Equipment Recommendations  3 in 1 bedside commode(may need wide commode)    Recommendations for Other Services      Precautions / Restrictions Precautions Precautions: Fall Precaution Comments: No pf/df at R Foot. Required Braces or Orthoses: Other Brace/Splint(cam boot) Restrictions Weight Bearing Restrictions: No       Mobility Bed Mobility Overal bed mobility: Needs Assistance Bed Mobility: Supine to Sit;Rolling;Sit to Supine Rolling: Modified independent (Device/Increase time)   Supine to sit: Min assist;HOB elevated Sit to supine: Min assist;HOB elevated   General bed mobility comments: Min A for R LE managment and to elevate trunk. VC for technique. Pt able to roll with use of bed rails no assist or cueing.  Transfers Overall transfer level: Needs assistance Equipment used: Rolling walker (2 wheeled) Transfers: Sit to/from Stand Sit to Stand: Min assist;Mod assist         General transfer comment: assist to rise and stabiliza    Balance Overall balance assessment: Needs assistance Sitting-balance support: No upper extremity supported;Feet supported Sitting balance-Leahy Scale: Fair Sitting balance - Comments: able to move out of midline/BOS, but guarded   Standing balance support: Bilateral upper extremity supported Standing balance-Leahy Scale: Poor Standing balance comment: reliant on the  RW and external support due to R foot instability                           ADL either performed or assessed with clinical judgement   ADL                               Toileting- Clothing Manipulation and Hygiene: Total assistance;Sit to/from stand         General ADL Comments: pt was able to don L sock; cam boot was already on R LE.  Assisted pt with hygiene and changing chux pad as she is on her cycle. Pt states she can usually perform hygiene, but she was unable to reach today.  Did not have much space to work with     Vision       Perception     Praxis      Cognition Arousal/Alertness: Awake/alert Behavior During Therapy: WFL for tasks assessed/performed Overall Cognitive Status: Within Functional Limits for tasks assessed                                          Exercises     Shoulder Instructions       General Comments HR in the 1 hundred teens at rest, increases to 130s with activity; RN aware    Pertinent Vitals/ Pain       Pain Assessment: Faces Pain Score: 10-Worst pain ever Faces Pain Scale: Hurts little more Pain Location: RLE Pain Descriptors /  Indicators: Aching Pain Intervention(s): Limited activity within patient's tolerance;Monitored during session;Premedicated before session;Repositioned  Home Living                                          Prior Functioning/Environment              Frequency  Min 2X/week        Progress Toward Goals  OT Goals(current goals can now be found in the care plan section)  Progress towards OT goals: Progressing toward goals  Acute Rehab OT Goals Patient Stated Goal: pt didn't state any specific goal, but wants to be able to live alone.  Plan      Co-evaluation                 AM-PAC PT "6 Clicks" Daily Activity     Outcome Measure   Help from another person eating meals?: None Help from another person taking care of personal  grooming?: A Little Help from another person toileting, which includes using toliet, bedpan, or urinal?: A Lot Help from another person bathing (including washing, rinsing, drying)?: A Lot Help from another person to put on and taking off regular upper body clothing?: A Little Help from another person to put on and taking off regular lower body clothing?: A Lot 6 Click Score: 16    End of Session    OT Visit Diagnosis: Muscle weakness (generalized) (M62.81);Pain Pain - Right/Left: Right Pain - part of body: Leg;Ankle and joints of foot   Activity Tolerance Patient tolerated treatment well   Patient Left in bed;with call bell/phone within reach;with family/visitor present   Nurse Communication          Time: 1454-1510 OT Time Calculation (min): 16 min  Charges: OT General Charges $OT Visit: 1 Visit OT Treatments $Self Care/Home Management : 8-22 mins  Lesle Chris, OTR/L [phone] 04/20/2017   Jenna Hill 04/20/2017, 3:38 PM

## 2017-04-20 NOTE — Progress Notes (Signed)
Impression: 1 Acute renal failure- found down, AKI due to ATN from severe rhabdo- SP HD beginnin 3/8.  Has Temp cath still. Now Non-Oliguric. With rising creat will dialyze today. 2 Severe depression/ schizoaffective disorder - on multiple psych meds- restarted - requiring a sitter 3 Resp failure - resolved 5 HTN - stable on norvasc- goes up at times maybe due to anxiety  6. Anemia- on ferrlecit - andaranesp s/p PRBCs yesterday  Subjective: Interval History: feels ok UOp about the same  Objective: Vital signs in last 24 hours: Temp:  [98.6 F (37 C)-99 F (37.2 C)] 98.6 F (37 C) (03/20 0605) Pulse Rate:  [116-123] 122 (03/20 0605) Resp:  [17] 17 (03/19 2120) BP: (156-170)/(79-99) 166/79 (03/20 0625) SpO2:  [98 %-100 %] 98 % (03/20 0605) Weight:  [123.7 kg (272 lb 12.8 oz)] 123.7 kg (272 lb 12.8 oz) (03/20 0656) Weight change: -2.259 kg (-15.7 oz)  Intake/Output from previous day: 03/19 0701 - 03/20 0700 In: 120 [P.O.:120] Out: 450 [Urine:300; Emesis/NG output:150] Intake/Output this shift: Total I/O In: 100 [P.O.:100] Out: -   General appearance: alert and cooperative Resp: clear to auscultation bilaterally Chest wall: no tenderness Cardio: regular rate and rhythm, S1, S2 normal, no murmur, click, rub or gallop Extremities: edema 1+  Strength in RLE improving  Lab Results: Recent Labs    04/18/17 2056 04/20/17 0342  WBC 21.0* 17.7*  HGB 10.9* 10.4*  HCT 32.7* 31.3*  PLT 301 323   BMET:  Recent Labs    04/19/17 0359 04/20/17 0342  NA 131* 133*  K 3.9 4.0  CL 94* 96*  CO2 24 25  GLUCOSE 123* 119*  BUN 25* 32*  CREATININE 7.35* 8.97*  CALCIUM 8.1* 8.3*   No results for input(s): PTH in the last 72 hours. Iron Studies: No results for input(s): IRON, TIBC, TRANSFERRIN, FERRITIN in the last 72 hours. Studies/Results: Dg Abd Portable 1v  Result Date: 04/19/2017 CLINICAL DATA:  Abdominal pain. Vomiting and diarrhea. Abdominal tenderness. EXAM:  PORTABLE ABDOMEN - 1 VIEW COMPARISON:  CT 04/13/2017 FINDINGS: No bowel dilatation to suggest obstruction. No evidence of free air on single supine view. Right lateral abdomen is excluded from the field of view. IMPRESSION: Nonobstructive bowel gas pattern. Right lateral abdomen excluded from the field of view. Electronically Signed   By: Jeb Levering M.D.   On: 04/19/2017 03:36    Scheduled: . acetaminophen  650 mg Oral TID  . amLODipine  5 mg Oral Daily  . calcium acetate  667 mg Oral TID WC  . Chlorhexidine Gluconate Cloth  6 each Topical Q0600  . cloZAPine  400 mg Oral QHS  . darbepoetin (ARANESP) injection - DIALYSIS  100 mcg Intravenous Q Fri-HD  . diclofenac sodium  4 g Topical QID  . doxepin  50 mg Oral QHS  . fentaNYL  25 mcg Transdermal Q48H  . heparin injection (subcutaneous)  5,000 Units Subcutaneous Q8H  . LORazepam  1 mg Intravenous Once  . QUEtiapine  400 mg Oral QHS  . ranitidine  150 mg Oral QHS  . vortioxetine HBr  20 mg Oral QHS    LOS: 15 days   [name] 04/20/2017,1:44 PM

## 2017-04-20 NOTE — Progress Notes (Signed)
Met with pt at bedside. She reports she is having dialysis today. Noted Fentanyl d/c'd and switching to oral pain meds. I will follow. [phone]

## 2017-04-20 NOTE — Progress Notes (Signed)
OT Cancellation Note  Patient Details Name: Jenna Hill MRN: [id number] DOB: 1970-11-28   Cancelled Treatment:    Reason Eval/Treat Not Completed: Pain limiting ability to participate. Pt reports her ankle pain is "through the roof".  She is being transitioned to po medication today; had last IV a couple of hours ago.  If pain improves enough to participate in OT, RN will page me.  If not, we will check back another day.  Rhodie Cienfuegos 04/20/2017, 11:37 AM  Lesle Chris, OTR/L [phone] 04/20/2017

## 2017-04-20 NOTE — Progress Notes (Signed)
PROGRESS NOTE    Jenna Hill  ENI:[id number] DOB: 31-Jul-1970 DOA: 04/05/2017 PCP: Alfonse Spruce, FNP   Brief Narrative:  47 year old with history of bipolar disorder, schizophrenia, hypertension who presented to the hospital on 04/05/2017 obtunded with hypothermia and tachycardia.  Narcan was given which did not help.  Patient was then intubated for airway protection.  Her chest x-ray did show some vascular congestion, patient received 4 days of Unasyn for possible aspiration.  Patient was given aggressive IV hydration for renal injury.  Nephrology was consulted and appreciated, patient was placed on dialysis.  Patient was extubated on 04/08/2017.  She was also placed on Unasyn for presumed cellulitis of the right lower extremity.  She had CT of the hip which showed some edema versus infection, she was evaluated by orthopedics, there was no concern for compartment syndrome.  Patient did develop some abdominal pain, CTs scan showed concern for pancreatitis.  She is noted to have gallstones on ultrasound however no evidence of cholecystitis.  Patient will eventually need GI and surgical outpatient follow-ups.  Assessment & Plan   Acute metabolic encephalopathy -Possibly related to medication overdose and renal failure -Mental status does appear to be improved, patient is at baseline -CT head unremarkable -UDS and APAP, salicylate negative  Bipolar/schizoaffective disorder -Psychiatry consulted and appreciated as patient hearing voices.  -Continue clozapine, with gradual increase to 400mg  daily (to avoid myocarditis) -patient does not need inpatient psych admission -continue abilify and doxepin   Tachycardia  -possible secondary to anemia, renal failure, pain -continue to monitor   Acute respiratory failure -Required intubation on admission, was extubated on 04/08/2017 -Respiratory viral panel negative -Respiratory culture shows no growth -CXR x-ray showed left lower lobe  consolidation -Treated with Unasyn  Rhabdomyolysis with severe swelling of the right lower extremity/pain -CK appears to be improving -She did require hemodialysis -CT on 04/09/2017 showed moderate subcutaneous edema and fat stranding about the right lateral hip and circumferentially through the thigh, nonspecific, but can be seen with cellulitis.  No drainable fluid collection. -Right lower extremity venous Doppler negative -Patient continues to have pain and has been using IV fentanyl frequently, over 300 mcg in 1 day -Patient has intolerance to tramadol, Percocet, Vicodin and NSAIDs -Place patient on fentanyl patch with tramadol for breakthrough pain -Orthopedics consulted and appreciated, no concern for compartment syndrome  Acute kidney injury/ATN -Secondary to the above -Neurology consulted and appreciated, patient did require hemodialysis -urine output has been improving -Creatinine currently 8.97 -Continue to monitor BMP  Elevated transaminases/? Pancreatitis -Suspect secondary to shock liver versus rhabdomyolysis -LFTs have improved -Ultrasound with cholelithiasis-suspect patient will need outpatient surgery or GI follow-up -Hepatitis B negative -CT abdomen showed possible pancreatitis.  Lipase was negative.  Patient denied abdominal pain.  Leukocytosis -WBC improving, currently  17.7 -CT hip with edema, possible cellulitis -Completed Ancef course -Blood Cultures show no growth to date -Urine culture shows insignificant growth -Upon review of patient's chart, patient has had leukocytosis for the past several years, she will need hematology outpatient follow-up -continue to monitor CBC  Hypertriglyceridemia -Will need Lopid 1 months old pain has improved  Chronic anemia -Hemoglobin dropped to 6, patient received 2 units PRBC on 04/18/2017 -Baseline hemoglobin approximately 9-10, currently 10.4 -Continue aranesp  Essential hypertension -continue amlodipine  Physical  deconditioning -PT recommended inpatient rehab -CIR consulted  DVT Prophylaxis  heparin  Code Status: Full  Family Communication: none at bedside  Disposition Plan: Admitted. Pending CIR.  Consultants PCCM Nephrology Psychiatry  Orthopedics Inpatient  rehab  Procedures  Intubation/extubation Insertion of HD catheter  Antibiotics   Anti-infectives (From admission, onward)   Start     Dose/Rate Route Frequency Ordered Stop   04/13/17 2000  ceFAZolin (ANCEF) IVPB 1 g/50 mL premix     1 g 100 mL/hr over 30 Minutes Intravenous Every 24 hours 04/13/17 1401 04/19/17 2053   04/09/17 2000  Ampicillin-Sulbactam (UNASYN) 3 g in sodium chloride 0.9 % 100 mL IVPB  Status:  Discontinued     3 g 200 mL/hr over 30 Minutes Intravenous Every 24 hours 04/09/17 1119 04/11/17 1134   04/08/17 0600  Ampicillin-Sulbactam (UNASYN) 3 g in sodium chloride 0.9 % 100 mL IVPB  Status:  Discontinued     3 g 200 mL/hr over 30 Minutes Intravenous Every 24 hours 04/07/17 0719 04/09/17 1119   04/06/17 1800  Ampicillin-Sulbactam (UNASYN) 3 g in sodium chloride 0.9 % 100 mL IVPB  Status:  Discontinued     3 g 200 mL/hr over 30 Minutes Intravenous Every 12 hours 04/06/17 0942 04/07/17 0719   04/05/17 1800  Ampicillin-Sulbactam (UNASYN) 3 g in sodium chloride 0.9 % 100 mL IVPB  Status:  Discontinued     3 g 200 mL/hr over 30 Minutes Intravenous Every 6 hours 04/05/17 1620 04/06/17 0942   04/05/17 1630  Ampicillin-Sulbactam (UNASYN) 3 g in sodium chloride 0.9 % 100 mL IVPB     3 g 200 mL/hr over 30 Minutes Intravenous  Once 04/05/17 1608 04/05/17 1844      Subjective:   Jenna Hill seen and examined today.  Continues to complain of pain.  Denies current chest pain, shortness of breath, abdominal pain, nausea or vomiting, diarrhea constipation.  Objective:   Vitals:   04/20/17 0605 04/20/17 0625 04/20/17 0656 04/20/17 1427  BP: (!) 164/99 (!) 166/79  (!) 166/93  Pulse: (!) 122   (!) 124  Resp:     18  Temp: 98.6 F (37 C)   98.5 F (36.9 C)  TempSrc: Oral   Oral  SpO2: 98%   99%  Weight:   123.7 kg (272 lb 12.8 oz)   Height:        Intake/Output Summary (Last 24 hours) at 04/20/2017 1509 Last data filed at 04/20/2017 1427 Gross per 24 hour  Intake 300 ml  Output -  Net 300 ml   Filed Weights   04/18/17 1346 04/18/17 1746 04/20/17 0656  Weight: 126 kg (277 lb 12.5 oz) 124 kg (273 lb 5.9 oz) 123.7 kg (272 lb 12.8 oz)    Exam  General: Well developed, well nourished, NAD, appears stated age  45: NCAT, mucous membranes moist.   Neck: Supple  Cardiovascular: S1 S2 auscultated, tachycardic  Respiratory: Clear to auscultation bilaterally with equal chest rise  Abdomen: Soft, nontender, nondistended, + bowel sounds  Extremities: warm dry without cyanosis clubbing or edema  Neuro: AAOx3, nonfocal  Psych: Normal affect and demeanor    Data Reviewed: I have personally reviewed following labs and imaging studies  CBC: Recent Labs  Lab 04/16/17 1119 04/17/17 0410 04/18/17 0423 04/18/17 2056 04/20/17 0342  WBC 21.1* 24.2* 16.6* 21.0* 17.7*  HGB 8.1* 8.3* 6.8* 10.9* 10.4*  HCT 24.1* 25.2* 20.2* 32.7* 31.3*  MCV 88.3 87.5 89.0 88.4 88.9  PLT 314 356 300 301 932   Basic Metabolic Panel: Recent Labs  Lab 04/16/17 0406 04/17/17 0410 04/18/17 0423 04/19/17 0359 04/20/17 0342  NA 132* 131* 131* 131* 133*  K 4.2 4.6 4.3 3.9 4.0  CL 96* 96* 93* 94* 96*  CO2 26 24 25 24 25   GLUCOSE 104* 92 96 123* 119*  BUN 23* 32* 40* 25* 32*  CREATININE 5.67* 7.69* 9.77* 7.35* 8.97*  CALCIUM 7.9* 8.1* 8.3* 8.1* 8.3*  PHOS 4.3 5.2* 5.9* 3.8 4.6   GFR: Estimated Creatinine Clearance: 10.6 mL/min (A) (by C-G formula based on SCr of 8.97 mg/dL (H)). Liver Function Tests: Recent Labs  Lab 04/14/17 0145  04/16/17 0406 04/17/17 0410 04/18/17 0423 04/19/17 0359 04/20/17 0342  AST 102*  --   --   --  43*  --   --   ALT 122*  --   --   --  9*  --   --   ALKPHOS 111   --   --   --  75  --   --   BILITOT 0.6  --   --   --  0.4  --   --   PROT 5.2*  --   --   --  5.1*  --   --   ALBUMIN 1.8*   < > 1.9* 1.9* 2.0*  2.0* 2.2* 2.3*   < > = values in this interval not displayed.   Recent Labs  Lab 04/14/17 0627  LIPASE 30   No results for input(s): AMMONIA in the last 168 hours. Coagulation Profile: No results for input(s): INR, PROTIME in the last 168 hours. Cardiac Enzymes: Recent Labs  Lab 04/14/17 0145 04/18/17 0423  CKTOTAL 3,827* 831*   BNP (last 3 results) No results for input(s): PROBNP in the last 8760 hours. HbA1C: No results for input(s): HGBA1C in the last 72 hours. CBG: Recent Labs  Lab 04/15/17 1854  GLUCAP 106*   Lipid Profile: Recent Labs    04/18/17 0423  TRIG 259*   Thyroid Function Tests: No results for input(s): TSH, T4TOTAL, FREET4, T3FREE, THYROIDAB in the last 72 hours. Anemia Panel: No results for input(s): VITAMINB12, FOLATE, FERRITIN, TIBC, IRON, RETICCTPCT in the last 72 hours. Urine analysis:    Component Value Date/Time   COLORURINE BROWN (A) 04/11/2017 1814   APPEARANCEUR TURBID (A) 04/11/2017 1814   LABSPEC 1.020 04/11/2017 1814   PHURINE 7.0 04/11/2017 1814   GLUCOSEU NEGATIVE 04/11/2017 1814   HGBUR LARGE (A) 04/11/2017 1814   BILIRUBINUR SMALL (A) 04/11/2017 1814   KETONESUR NEGATIVE 04/11/2017 1814   PROTEINUR 100 (A) 04/11/2017 1814   UROBILINOGEN 0.2 08/02/2008 0812   NITRITE POSITIVE (A) 04/11/2017 1814   LEUKOCYTESUR TRACE (A) 04/11/2017 1814   Sepsis Labs: @LABRCNTIP (procalcitonin:4,lacticidven:4)  ) Recent Results (from the past 240 hour(s))  Culture, Urine     Status: Abnormal   Collection Time: 04/11/17 10:49 PM  Result Value Ref Range Status   Specimen Description URINE, CLEAN CATCH  Final   Special Requests NONE  Final   Culture (A)  Final    <10,000 COLONIES/mL INSIGNIFICANT GROWTH Performed at [facility], [address], [place], [place] [address]    Report  Status 04/13/2017 FINAL  Final  Culture, blood (Routine X 2) w Reflex to ID Panel     Status: None   Collection Time: 04/12/17  6:26 PM  Result Value Ref Range Status   Specimen Description BLOOD RIGHT ARM  Final   Special Requests   Final    BOTTLES DRAWN AEROBIC AND ANAEROBIC Blood Culture adequate volume   Culture   Final    NO GROWTH 5 DAYS Performed at [facility], [place]  [address], [place], [place] [address]    Report Status 04/17/2017 FINAL  Final  Culture, blood (Routine X 2) w Reflex to ID Panel     Status: None   Collection Time: 04/12/17  6:40 PM  Result Value Ref Range Status   Specimen Description BLOOD RIGHT HAND  Final   Special Requests IN PEDIATRIC BOTTLE Blood Culture adequate volume  Final   Culture   Final    NO GROWTH 5 DAYS Performed at [facility], [place] [address] [place], [place] [address]    Report Status 04/17/2017 FINAL  Final      Radiology Studies: Dg Abd Portable 1v  Result Date: 04/19/2017 CLINICAL DATA:  Abdominal pain. Vomiting and diarrhea. Abdominal tenderness. EXAM: PORTABLE ABDOMEN - 1 VIEW COMPARISON:  CT 04/13/2017 FINDINGS: No bowel dilatation to suggest obstruction. No evidence of free air on single supine view. Right lateral abdomen is excluded from the field of view. IMPRESSION: Nonobstructive bowel gas pattern. Right lateral abdomen excluded from the field of view. Electronically Signed   By: Jeb Levering M.D.   On: 04/19/2017 03:36     Scheduled Meds: . acetaminophen  650 mg Oral TID  . amLODipine  5 mg Oral Daily  . calcium acetate  667 mg Oral TID WC  . Chlorhexidine Gluconate Cloth  6 each Topical Q0600  . cloZAPine  400 mg Oral QHS  . darbepoetin (ARANESP) injection - DIALYSIS  100 mcg Intravenous Q Fri-HD  . diclofenac sodium  4 g Topical QID  . doxepin  50 mg Oral QHS  . fentaNYL  25 mcg Transdermal Q48H  . heparin injection (subcutaneous)  5,000 Units Subcutaneous Q8H  . LORazepam  1 mg Intravenous Once  .  QUEtiapine  400 mg Oral QHS  . ranitidine  150 mg Oral QHS  . vortioxetine HBr  20 mg Oral QHS   Continuous Infusions: . sodium chloride    . sodium chloride       LOS: 15 days   Time Spent in minutes   30 minutes  Jenna Hill D.O. on 04/20/2017 at 3:09 PM  Between 7am to 7pm - Pager - [phone]  After 7pm go to [email or web address] - password [email or web address]  And look for the night coverage person covering for me after hours  [facility] Office  [phone]

## 2017-04-21 DIAGNOSIS — I10 Essential (primary) hypertension: Secondary | ICD-10-CM

## 2017-04-21 DIAGNOSIS — R748 Abnormal levels of other serum enzymes: Secondary | ICD-10-CM

## 2017-04-21 DIAGNOSIS — R112 Nausea with vomiting, unspecified: Secondary | ICD-10-CM

## 2017-04-21 DIAGNOSIS — F319 Bipolar disorder, unspecified: Secondary | ICD-10-CM

## 2017-04-21 DIAGNOSIS — R140 Abdominal distension (gaseous): Secondary | ICD-10-CM

## 2017-04-21 LAB — RENAL FUNCTION PANEL
ALBUMIN: 2.2 g/dL — AB (ref 3.5–5.0)
ANION GAP: 13 (ref 5–15)
BUN: 15 mg/dL (ref 6–20)
CALCIUM: 8 mg/dL — AB (ref 8.9–10.3)
CO2: 26 mmol/L (ref 22–32)
Chloride: 96 mmol/L — ABNORMAL LOW (ref 101–111)
Creatinine, Ser: 5.87 mg/dL — ABNORMAL HIGH (ref 0.44–1.00)
GFR calc Af Amer: 9 mL/min — ABNORMAL LOW (ref >60)
GFR, EST NON AFRICAN AMERICAN: 8 mL/min — AB (ref >60)
GLUCOSE: 135 mg/dL — AB (ref 65–99)
PHOSPHORUS: 3.4 mg/dL (ref 2.5–4.6)
Potassium: 3.4 mmol/L — ABNORMAL LOW (ref 3.5–5.1)
Sodium: 135 mmol/L (ref 135–145)

## 2017-04-21 MED ORDER — DARBEPOETIN ALFA 100 MCG/0.5ML IJ SOSY
100.0000 ug | PREFILLED_SYRINGE | INTRAMUSCULAR | Status: DC
  Administered 2017-04-22: 100 ug via SUBCUTANEOUS
  Filled 2017-04-21 (×2): qty 0.5

## 2017-04-21 NOTE — Progress Notes (Signed)
Physical Therapy Treatment Patient Details Name: Jenna Hill MRN: [id number] DOB: Apr 12, 1970 Today's Date: 04/21/2017    History of Present Illness pt is a 47 y/o female with pmh significant for schizophrenia, HTN, bipolar d/o and attempted suicide admitted with AMS and hypothermia, suspected intentional OD of psych medication.  Treating for aspiration and sepsis.  Extubated 3/8. Received HD on same day. has been on HD, there some hope for renal function recovery. She has been having leukocytosis of unclear etiology. She has been receiving Unasyn to cover for presume cellulitis of right lower extremity. She had CT hip that showed edema vs infection. She was evaluated by ortho and there was not concern for compartment syndrome.    PT Comments    Pt continues to make steady progress towards her goals, however still has reduced L LE strength and endurance. PT is currently minA for management of L LE into and out of bed, min guard to minA for transfers and minA for ambulation of 4x 12 feet with RW. Pt still fatigues quickly requiring seated rest breaks. Pt is very motivated to work with PT and to return to PLOF making her a perfect candidate for CIR. PT will continue to follow acutely until d/c.    Follow Up Recommendations  CIR     Equipment Recommendations  Rolling walker with 5" wheels;3in1 (PT)    Recommendations for Other Services Rehab consult     Precautions / Restrictions Precautions Precautions: Fall Precaution Comments: No pf/df at R Foot. Required Braces or Orthoses: Other Brace/Splint(cam boot) Restrictions Weight Bearing Restrictions: No    Mobility  Bed Mobility Overal bed mobility: Needs Assistance Bed Mobility: Supine to Sit;Sit to Supine     Supine to sit: Min assist;HOB elevated Sit to supine: Min assist;HOB elevated   General bed mobility comments: Min A for R LE managment out of and into bed  Transfers Overall transfer level: Needs assistance Equipment  used: Rolling walker (2 wheeled)   Sit to Stand: Min assist         General transfer comment: minA for stabilization on 1st transfer, min guard for 3 additional transfers  Ambulation/Gait Ambulation/Gait assistance: Min assist Ambulation Distance (Feet): 12 Feet(4 x 12 feet) Assistive device: Rolling walker (2 wheeled) Gait Pattern/deviations: Decreased stance time - right;Decreased step length - left Gait velocity: decreased Gait velocity interpretation: Below normal speed for age/gender General Gait Details: Min A to steady, pt fatigues quickly, requiring chair to follow closely behind. Abe to ambulate 4x 12 feet        Balance Overall balance assessment: Needs assistance Sitting-balance support: No upper extremity supported;Feet supported Sitting balance-Leahy Scale: Fair     Standing balance support: Bilateral upper extremity supported Standing balance-Leahy Scale: Poor Standing balance comment: requires RW for assist                            Cognition Arousal/Alertness: Awake/alert Behavior During Therapy: WFL for tasks assessed/performed Overall Cognitive Status: Within Functional Limits for tasks assessed                                               Pertinent Vitals/Pain Pain Assessment: Faces Faces Pain Scale: Hurts little more Pain Location: RLE Pain Descriptors / Indicators: Aching Pain Intervention(s): Limited activity within patient's tolerance;Monitored during session;Repositioned  PT Goals (current goals can now be found in the care plan section) Acute Rehab PT Goals Patient Stated Goal: pt didn't state any specific goal, but wants to be able to live alone. PT Goal Formulation: With patient Time For Goal Achievement: 04/22/17 Potential to Achieve Goals: Good Progress towards PT goals: Progressing toward goals    Frequency    Min 3X/week      PT Plan Current plan remains appropriate        AM-PAC PT "6 Clicks" Daily Activity  Outcome Measure  Difficulty turning over in bed (including adjusting bedclothes, sheets and blankets)?: A Little Difficulty moving from lying on back to sitting on the side of the bed? : Unable Difficulty sitting down on and standing up from a chair with arms (e.g., wheelchair, bedside commode, etc,.)?: Unable Help needed moving to and from a bed to chair (including a wheelchair)?: A Little Help needed walking in hospital room?: A Little Help needed climbing 3-5 steps with a railing? : Total 6 Click Score: 12    End of Session Equipment Utilized During Treatment: Gait belt Activity Tolerance: Patient tolerated treatment well Patient left: with call bell/phone within reach;in bed;with bed alarm set Nurse Communication: Mobility status PT Visit Diagnosis: Unsteadiness on feet (R26.81);Other abnormalities of gait and mobility (R26.89);Muscle weakness (generalized) (M62.81);Other symptoms and signs involving the nervous system (C58.527)     Time: 7824-2353 PT Time Calculation (min) (ACUTE ONLY): 22 min  Charges:  $Gait Training: 8-22 mins                    G Codes:       Jenna Hill B. Migdalia Dk PT, DPT Acute Rehabilitation  [phone] Pager [phone]     Jenna Hill 04/21/2017, 3:24 PM

## 2017-04-21 NOTE — Progress Notes (Signed)
PROGRESS NOTE    Jenna Hill  QHU:[id number] DOB: 03-18-1970 DOA: 04/05/2017 PCP: Alfonse Spruce, FNP   Brief Narrative:  47 year old with history of bipolar disorder, schizophrenia, hypertension who presented to the hospital on 04/05/2017 obtunded with hypothermia and tachycardia.  Narcan was given which did not help.  Patient was then intubated for airway protection.  Her chest x-ray did show some vascular congestion, patient received 4 days of Unasyn for possible aspiration.  Patient was given aggressive IV hydration for renal injury.  Nephrology was consulted and appreciated, patient was placed on dialysis.  Patient was extubated on 04/08/2017.  She was also placed on Unasyn for presumed cellulitis of the right lower extremity.  She had CT of the hip which showed some edema versus infection, she was evaluated by orthopedics, there was no concern for compartment syndrome.  Patient did develop some abdominal pain, CTs scan showed concern for pancreatitis.  She is noted to have gallstones on ultrasound however no evidence of cholecystitis.  Patient will eventually need GI and surgical outpatient follow-ups.  Assessment & Plan   Acute metabolic encephalopathy -Possibly related to medication overdose and renal failure -Mental status does appear to be improved, patient is at baseline -CT head unremarkable -UDS and APAP, salicylate negative  Bipolar/schizoaffective disorder -Psychiatry consulted and appreciated as patient hearing voices.  -Continue clozapine, with gradual increase to 400mg  daily (to avoid myocarditis) -patient does not need inpatient psych admission -continue abilify and doxepin   Tachycardia  -possible secondary to anemia, renal failure, pain -continue to monitor   Acute respiratory failure -Required intubation on admission, was extubated on 04/08/2017 -Respiratory viral panel negative -Respiratory culture shows no growth -CXR x-ray showed left lower lobe  consolidation -Treated with Unasyn  Rhabdomyolysis with severe swelling of the right lower extremity/pain -CK improved and trending downward -CT on 04/09/2017 showed moderate subcutaneous edema and fat stranding about the right lateral hip and circumferentially through the thigh, nonspecific, but can be seen with cellulitis.  No drainable fluid collection. -Right lower extremity venous Doppler negative -Patient continues to have pain and has been using IV fentanyl frequently, over 300 mcg in 1 day -Patient has intolerance to tramadol, Percocet, Vicodin and NSAIDs -Continue fentanyl patch with tramadol PRN  -Orthopedics consulted and appreciated, no concern for compartment syndrome  Acute kidney injury/ATN -Secondary to the above -Neurology consulted and appreciated -last hemodialysis session on 04/20/2017 -Creatinine currently 5.87 -Continue to monitor BMP  Elevated transaminases/? Pancreatitis -Suspect secondary to shock liver versus rhabdomyolysis -LFTs have improved -Ultrasound with cholelithiasis-suspect patient will need outpatient surgery or GI follow-up -Hepatitis B negative -CT abdomen showed possible pancreatitis.  Lipase was negative.  Patient denied abdominal pain.  Leukocytosis -WBC improving, currently  17.7 -CT hip with edema, possible cellulitis -Completed Ancef course -Blood Cultures show no growth to date -Urine culture shows insignificant growth -Upon review of patient's chart, patient has had leukocytosis for the past several years, she will need hematology outpatient follow-up -continue to monitor CBC  Hypertriglyceridemia -will need lopid once rhabdo has resolved  Chronic anemia -Hemoglobin dropped to 6, patient received 2 units PRBC on 04/18/2017 -Baseline hemoglobin approximately 9-10, currently 10.4 -Continue aranesp  Essential hypertension -continue amlodipine  Physical deconditioning -PT recommended inpatient rehab -CIR  consulted  Nausea/vomiting -occurred overnight, currently denies further nausea -continue zofran PRN  DVT Prophylaxis  heparin  Code Status: Full  Family Communication: none at bedside  Disposition Plan: Admitted. Pending CIR.  Consultants PCCM Nephrology Psychiatry  Orthopedics Inpatient rehab  Procedures  Intubation/extubation Insertion of HD catheter  Antibiotics   Anti-infectives (From admission, onward)   Start     Dose/Rate Route Frequency Ordered Stop   04/13/17 2000  ceFAZolin (ANCEF) IVPB 1 g/50 mL premix     1 g 100 mL/hr over 30 Minutes Intravenous Every 24 hours 04/13/17 1401 04/19/17 2053   04/09/17 2000  Ampicillin-Sulbactam (UNASYN) 3 g in sodium chloride 0.9 % 100 mL IVPB  Status:  Discontinued     3 g 200 mL/hr over 30 Minutes Intravenous Every 24 hours 04/09/17 1119 04/11/17 1134   04/08/17 0600  Ampicillin-Sulbactam (UNASYN) 3 g in sodium chloride 0.9 % 100 mL IVPB  Status:  Discontinued     3 g 200 mL/hr over 30 Minutes Intravenous Every 24 hours 04/07/17 0719 04/09/17 1119   04/06/17 1800  Ampicillin-Sulbactam (UNASYN) 3 g in sodium chloride 0.9 % 100 mL IVPB  Status:  Discontinued     3 g 200 mL/hr over 30 Minutes Intravenous Every 12 hours 04/06/17 0942 04/07/17 0719   04/05/17 1800  Ampicillin-Sulbactam (UNASYN) 3 g in sodium chloride 0.9 % 100 mL IVPB  Status:  Discontinued     3 g 200 mL/hr over 30 Minutes Intravenous Every 6 hours 04/05/17 1620 04/06/17 0942   04/05/17 1630  Ampicillin-Sulbactam (UNASYN) 3 g in sodium chloride 0.9 % 100 mL IVPB     3 g 200 mL/hr over 30 Minutes Intravenous  Once 04/05/17 1608 04/05/17 1844      Subjective:   Jenna Hill seen and examined today.  Continues to complain of right leg pain, but it has improved some. States "it feels as if someone is taking a knife to my leg." Had some nausea and vomiting overnight. Denies current abdominal pain, chest pain, shortness of breath, dizziness, headache.    Objective:   Vitals:   04/21/17 0100 04/21/17 0129 04/21/17 0200 04/21/17 0534  BP: 106/78 115/78 127/77 (!) 144/89  Pulse: (!) 115 (!) 115 (!) 120 (!) 118  Resp:  16 20 16   Temp:  98.7 F (37.1 C) 97.7 F (36.5 C) 98.7 F (37.1 C)  TempSrc:  Oral Oral   SpO2:  94% 97% 96%  Weight:  119.4 kg (263 lb 3.7 oz) 119 kg (262 lb 5.6 oz)   Height:        Intake/Output Summary (Last 24 hours) at 04/21/2017 1218 Last data filed at 04/21/2017 0129 Gross per 24 hour  Intake 200 ml  Output 5000 ml  Net -4800 ml   Filed Weights   04/20/17 2050 04/21/17 0129 04/21/17 0200  Weight: 124.7 kg (274 lb 14.6 oz) 119.4 kg (263 lb 3.7 oz) 119 kg (262 lb 5.6 oz)   Exam  General: Well developed, well nourished, NAD, appears stated age  53: NCAT,  mucous membranes moist.   Neck: Supple  Cardiovascular: S1 S2 auscultated, tachycardic  Respiratory: Clear to auscultation bilaterally with equal chest rise  Abdomen: Soft, nontender, nondistended, + bowel sounds  Extremities: warm dry without cyanosis clubbing or edema  Neuro: AAOx3, nonfocal  Psych: Appropriate mood and affect  Data Reviewed: I have personally reviewed following labs and imaging studies  CBC: Recent Labs  Lab 04/16/17 1119 04/17/17 0410 04/18/17 0423 04/18/17 2056 04/20/17 0342  WBC 21.1* 24.2* 16.6* 21.0* 17.7*  HGB 8.1* 8.3* 6.8* 10.9* 10.4*  HCT 24.1* 25.2* 20.2* 32.7* 31.3*  MCV 88.3 87.5 89.0 88.4 88.9  PLT 314 356 300 301 423   Basic Metabolic Panel: Recent  Labs  Lab 04/17/17 0410 04/18/17 0423 04/19/17 0359 04/20/17 0342 04/21/17 0412  NA 131* 131* 131* 133* 135  K 4.6 4.3 3.9 4.0 3.4*  CL 96* 93* 94* 96* 96*  CO2 24 25 24 25 26   GLUCOSE 92 96 123* 119* 135*  BUN 32* 40* 25* 32* 15  CREATININE 7.69* 9.77* 7.35* 8.97* 5.87*  CALCIUM 8.1* 8.3* 8.1* 8.3* 8.0*  PHOS 5.2* 5.9* 3.8 4.6 3.4   GFR: Estimated Creatinine Clearance: 15.9 mL/min (A) (by C-G formula based on SCr of 5.87 mg/dL  (H)). Liver Function Tests: Recent Labs  Lab 04/17/17 0410 04/18/17 0423 04/19/17 0359 04/20/17 0342 04/21/17 0412  AST  --  43*  --   --   --   ALT  --  9*  --   --   --   ALKPHOS  --  75  --   --   --   BILITOT  --  0.4  --   --   --   PROT  --  5.1*  --   --   --   ALBUMIN 1.9* 2.0*  2.0* 2.2* 2.3* 2.2*   No results for input(s): LIPASE, AMYLASE in the last 168 hours. No results for input(s): AMMONIA in the last 168 hours. Coagulation Profile: No results for input(s): INR, PROTIME in the last 168 hours. Cardiac Enzymes: Recent Labs  Lab 04/18/17 0423  CKTOTAL 831*   BNP (last 3 results) No results for input(s): PROBNP in the last 8760 hours. HbA1C: No results for input(s): HGBA1C in the last 72 hours. CBG: Recent Labs  Lab 04/15/17 1854  GLUCAP 106*   Lipid Profile: No results for input(s): CHOL, HDL, LDLCALC, TRIG, CHOLHDL, LDLDIRECT in the last 72 hours. Thyroid Function Tests: No results for input(s): TSH, T4TOTAL, FREET4, T3FREE, THYROIDAB in the last 72 hours. Anemia Panel: No results for input(s): VITAMINB12, FOLATE, FERRITIN, TIBC, IRON, RETICCTPCT in the last 72 hours. Urine analysis:    Component Value Date/Time   COLORURINE BROWN (A) 04/11/2017 1814   APPEARANCEUR TURBID (A) 04/11/2017 1814   LABSPEC 1.020 04/11/2017 1814   PHURINE 7.0 04/11/2017 1814   GLUCOSEU NEGATIVE 04/11/2017 1814   HGBUR LARGE (A) 04/11/2017 1814   BILIRUBINUR SMALL (A) 04/11/2017 1814   KETONESUR NEGATIVE 04/11/2017 1814   PROTEINUR 100 (A) 04/11/2017 1814   UROBILINOGEN 0.2 08/02/2008 0812   NITRITE POSITIVE (A) 04/11/2017 1814   LEUKOCYTESUR TRACE (A) 04/11/2017 1814   Sepsis Labs: @LABRCNTIP (procalcitonin:4,lacticidven:4)  ) Recent Results (from the past 240 hour(s))  Culture, Urine     Status: Abnormal   Collection Time: 04/11/17 10:49 PM  Result Value Ref Range Status   Specimen Description URINE, CLEAN CATCH  Final   Special Requests NONE  Final   Culture  (A)  Final    <10,000 COLONIES/mL INSIGNIFICANT GROWTH Performed at [facility], [address], [place], [place] [address]    Report Status 04/13/2017 FINAL  Final  Culture, blood (Routine X 2) w Reflex to ID Panel     Status: None   Collection Time: 04/12/17  6:26 PM  Result Value Ref Range Status   Specimen Description BLOOD RIGHT ARM  Final   Special Requests   Final    BOTTLES DRAWN AEROBIC AND ANAEROBIC Blood Culture adequate volume   Culture   Final    NO GROWTH 5 DAYS Performed at [facility], [place] [address], [place], [place] [address]    Report Status 04/17/2017  FINAL  Final  Culture, blood (Routine X 2) w Reflex to ID Panel     Status: None   Collection Time: 04/12/17  6:40 PM  Result Value Ref Range Status   Specimen Description BLOOD RIGHT HAND  Final   Special Requests IN PEDIATRIC BOTTLE Blood Culture adequate volume  Final   Culture   Final    NO GROWTH 5 DAYS Performed at [facility], [place] [address], [place], [place] [address]    Report Status 04/17/2017 FINAL  Final      Radiology Studies: No results found.   Scheduled Meds: . acetaminophen  650 mg Oral TID  . amLODipine  5 mg Oral Daily  . calcium acetate  667 mg Oral TID WC  . Chlorhexidine Gluconate Cloth  6 each Topical Q0600  . cloZAPine  400 mg Oral QHS  . darbepoetin (ARANESP) injection - DIALYSIS  100 mcg Intravenous Q Fri-HD  . diclofenac sodium  4 g Topical QID  . doxepin  50 mg Oral QHS  . fentaNYL  25 mcg Transdermal Q48H  . heparin injection (subcutaneous)  5,000 Units Subcutaneous Q8H  . LORazepam  1 mg Intravenous Once  . QUEtiapine  400 mg Oral QHS  . vortioxetine HBr  20 mg Oral QHS   Continuous Infusions: . sodium chloride    . sodium chloride       LOS: 16 days   Time Spent in minutes   30 minutes  Loyde Orth D.O. on 04/21/2017 at 12:18 PM  Between 7am to 7pm - Pager - [phone]  After 7pm go to [email or web address] - password [email or web address]  And look for  the night coverage person covering for me after hours  [facility] Office  [phone]

## 2017-04-21 NOTE — H&P (Signed)
Physical Medicine and Rehabilitation Admission H&P    Chief Complaint  Patient presents with  . Functional deficits due to rhabdomyolysis.    HPI:   Jenna Hill is a 47 y.o. female with history of bipolar disorder, schizophrenia, HTN , history of overdose/sucide attempts who was admitted after being found down on 04/05/17. Patient was obtunded, with dried vomitus on cheeks, hypothermic, tachycardic and had AKI. UDS negative and encephalopathy felt to be due to suicide attempt. She was intubated for airway protection and started on  IV unasyn for aspiration PNA and IVF for  AKI with rhabdomyolysis. She required HD briefly due to worsening of renal status with volume overload with oliguria.  She tolerated extubation on 3/8 with intermittent HD.  Urine output is improving and HD has been on hold since 3/20. SCr peaked at 11.3 and has started to trend downwards in the past few days. Anemia has been treated with transfusion, ferrlecit as well as aranesp.  Psychiatry consulted for input and felt that AMS likely due to polypharmacy and not SI with recommendations to titrated Clozaril to home dose.   She has had issues with nausea and CT abdomen/pelvis done revealing acute pancreatitis with diffuse subcutaneous and abdominal wall stranding and edema as well as edema of visualized upper thigh--no drainable fluid collection noted. As patient without abdominal pain and Lipase negative symptoms not felt to be due to pancreatitis. Abnormal LFTs felt to be due to shocked liver and rhabdomyolysis.  She continued to report numbness with dysesthesias right hemibody and has had 2-3 + edema RLE with weakness and foot drop.  Dr. Doreatha Martin was consulted due to concerns of compartment syndrome and felt that weakness and pain due to muscle damage from rhabdomyolysis. To use CAM boot/PRAFO to prevent foot drop and EMG/NCS in the future. Medical issues improving but BP remains labile. Patient noted to be limited by RLE  weakness as well as debility due to multiple medical issues. CIR recommended due to functional deficits.    Review of Systems  Constitutional: Negative for chills and fever.  HENT: Negative for hearing loss and tinnitus.   Eyes: Negative for blurred vision and double vision.  Respiratory: Positive for shortness of breath. Negative for cough.   Cardiovascular: Negative for chest pain and palpitations.  Gastrointestinal: Negative for constipation, heartburn and nausea.       Nausea with food  Genitourinary: Negative for dysuria and urgency.  Musculoskeletal: Negative for myalgias and neck pain.  Skin: Negative for itching and rash.  Neurological: Positive for dizziness, sensory change (From right collar bone down right side. ) and headaches.  Psychiatric/Behavioral: The patient has insomnia.     Past Medical History:  Diagnosis Date  . Bipolar 1 disorder ([place])   . Hypertension   . Schizophrenia [facility])     Past Surgical History:  Procedure Laterality Date  . ANKLE ARTHROSCOPY Right   . CESAREAN SECTION    . HAND SURGERY Right   . ORIF ANKLE FRACTURE Left 01/29/2016   Procedure: OPEN REDUCTION INTERNAL FIXATION LEFT ANKLE BIMALLEOLAR FRACTURE AND SYNDESMOSIS;  Surgeon: Wylene Simmer, MD;  Location: [place];  Service: Orthopedics;  Laterality: Left;    Family History  Problem Relation Age of Onset  . Healthy Mother   . Healthy Father   . Healthy Sister     Social History:   Lives alone--has family in [place]. 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 She reports that  has never smoked.  She has never used smokeless tobacco. She reports that she doe not drink alcohol. She reports that she does not use drugs.     Allergies  Allergen Reactions  . Bee Venom Anaphylaxis  . Codeine Swelling    Facial swelling  . Other Anaphylaxis    bertrillium or bertillium    . Toradol [Ketorolac Tromethamine] Swelling    Tongue  swelling  . Lortab [Hydrocodone-Acetaminophen] Hives  . Percocet [Oxycodone-Acetaminophen] Hives  . Ibuprofen     Raised hives Also with naproxen  . Latex Rash    Medications Prior to Admission  Medication Sig Dispense Refill  . ABILIFY MAINTENA 400 MG PRSY Inject 400 mg into the skin every 30 (thirty) days. 12/18/15  1  . clozapine (CLOZARIL) 200 MG tablet Take 400 mg by mouth at bedtime.     Marland Kitchen doxepin (SINEQUAN) 50 MG capsule Take 50-100 mg by mouth at bedtime.    Marland Kitchen EPINEPHrine 0.3 mg/0.3 mL IJ SOAJ injection Inject 0.3 mLs (0.3 mg total) into the muscle daily as needed (allergic reaction). 1 Device 0  . ESZOPICLONE 3 MG tablet Take 3 mg by mouth at bedtime.  0  . QUEtiapine (SEROQUEL XR) 400 MG 24 hr tablet Take 400 mg by mouth at bedtime.     . TRINTELLIX 20 MG TABS tablet Take 20 mg by mouth daily. With food  2  . acetaminophen (TYLENOL) 500 MG tablet Take 2 tablets (1,000 mg total) by mouth every 6 (six) hours as needed for mild pain or moderate pain. (Patient not taking: Reported on 04/06/2017) 60 tablet 0  . amLODipine (NORVASC) 5 MG tablet Take 1 tablet (5 mg total) by mouth daily. (Patient not taking: Reported on 04/06/2017) 30 tablet 2  . aspirin EC 81 MG tablet Take 1 tablet (81 mg total) by mouth 2 (two) times daily. (Patient not taking: Reported on 04/06/2017) 90 tablet 0  . hydrochlorothiazide (HYDRODIURIL) 12.5 MG tablet Take 2 tablets (25 mg total) by mouth daily. (Patient not taking: Reported on 04/06/2017) 30 tablet 2  . loratadine (ALLERGY RELIEF) 10 MG tablet Take 10 mg by mouth daily.      Drug Regimen Review  Drug regimen was reviewed and remains appropriate with no significant issues identified  Home: Home Living Family/patient expects to be discharged to:: Private residence Living Arrangements: Alone Available Help at Discharge: Friend(s), Available PRN/intermittently Type of Home: Apartment Home Access: Stairs to enter [employer] of Steps: 3 Entrance  Stairs-Rails: None Home Layout: One level Bathroom Shower/Tub: Chiropodist: Standard Bathroom Accessibility: Yes Home Equipment: None Additional Comments: friend works 7 pm until 7 am and can check in on her  Lives With: Alone   Functional History: Prior Function Level of Independence: Independent Comments: drove and ran her own errands, but didn't work due to her schizophrenia.  Functional Status:  Mobility: Bed Mobility Overal bed mobility: Needs Assistance Bed Mobility: Supine to Sit, Sit to Supine Rolling: Modified independent (Device/Increase time) Sidelying to sit: Min assist Supine to sit: Min assist, HOB elevated Sit to supine: Min assist, HOB elevated General bed mobility comments: Min A for R LE managment out of and into bed Transfers Overall transfer level: Needs assistance Equipment used: Rolling walker (2 wheeled) Transfers: Sit to/from Stand Sit to Stand: Min assist Stand pivot transfers: Min assist General transfer comment: minA for stabilization on 1st transfer, min guard for 3 additional transfers Ambulation/Gait Ambulation/Gait  assistance: Min assist Ambulation Distance (Feet): 12 Feet(4 x 12 feet) Assistive device: Rolling walker (2 wheeled) Gait Pattern/deviations: Decreased stance time - right, Decreased step length - left General Gait Details: Min A to steady, pt fatigues quickly, requiring chair to follow closely behind. Abe to ambulate 4x 12 feet  Gait velocity: decreased Gait velocity interpretation: Below normal speed for age/gender    ADL: ADL Overall ADL's : Needs assistance/impaired Toilet Transfer: Moderate assistance, Stand-pivot, BSC, RW Toilet Transfer Details (indicate cue type and reason): simulated with EOB>recliner. assist to steady and control descent. Toileting- Clothing Manipulation and Hygiene: Total assistance, Sit to/from stand General ADL Comments: pt was able to don L sock; cam boot was already on R LE.   Assisted pt with hygiene and changing chux pad as she is on her cycle. Pt states she can usually perform hygiene, but she was unable to reach today.  Did not have much space to work with  Cognition: Cognition Overall Cognitive Status: Within Functional Limits for tasks assessed Orientation Level: Oriented X4 Cognition Arousal/Alertness: Awake/alert Behavior During Therapy: WFL for tasks assessed/performed Overall Cognitive Status: Within Functional Limits for tasks assessed General Comments: low speech volume   Blood pressure (!) 152/89, pulse (!) 111, temperature 99 F (37.2 C), temperature source Oral, resp. rate 18, height 5' 6.5" (1.689 m), weight 118 kg (260 lb 3.2 oz), SpO2 94 %. Physical Exam  Nursing note and vitals reviewed. Constitutional: She is oriented to person, place, and time. She appears well-developed and well-nourished. No distress.  HENT:  Head: Atraumatic.  Mouth/Throat: Oropharynx is clear and moist.  Eyes: Pupils are equal, round, and reactive to light. Conjunctivae and EOM are normal.  Neck: Normal range of motion. Neck supple.  Cardiovascular: Normal rate and regular rhythm.  No murmur heard. Respiratory: Effort normal and breath sounds normal. No stridor. No respiratory distress.  GI: Soft. Bowel sounds are normal. She exhibits no distension. There is no tenderness.  Musculoskeletal: She exhibits edema.  Moderate edema right hip -->thight-->foot. Able to activate RLE without pain now.   Neurological: She is alert and oriented to person, place, and time.  UE 5/5 prox to distal. LLE 4/5 prox to distal. RLE: 3 to 3+/5 HF, KE and 0/5 ADF/PF. Decreased sensation right leg which is more severe at foot. Fair insight and awareness.  Skin: Skin is warm and dry. She is not diaphoretic.  Psychiatric: She has a normal mood and affect. Her behavior is normal.  Fairly focused, follows commands. Spoken in a foreign language to me at end of our visit    Results for  orders placed or performed during the hospital encounter of 04/05/17 (from the past 48 hour(s))  CBC     Status: Abnormal   Collection Time: 04/23/17  5:41 PM  Result Value Ref Range   WBC 12.7 (H) 4.0 - 10.5 K/uL   RBC 3.43 (L) 3.87 - 5.11 MIL/uL   Hemoglobin 10.3 (L) 12.0 - 15.0 g/dL   HCT 31.4 (L) 36.0 - 46.0 %   MCV 91.5 78.0 - 100.0 fL   MCH 30.0 26.0 - 34.0 pg   MCHC 32.8 30.0 - 36.0 g/dL   RDW 15.0 11.5 - 15.5 %   Platelets 300 150 - 400 K/uL    Comment: Performed at [facility], [place] [address], [place], [place] [address]  Renal function panel     Status: Abnormal   Collection Time: 04/24/17  4:04 AM  Result Value Ref Range  Sodium 136 135 - 145 mmol/L   Potassium 4.7 3.5 - 5.1 mmol/L   Chloride 99 (L) 101 - 111 mmol/L   CO2 25 22 - 32 mmol/L   Glucose, Bld 96 65 - 99 mg/dL   BUN 42 (H) 6 - 20 mg/dL   Creatinine, Ser 10.93 (H) 0.44 - 1.00 mg/dL   Calcium 9.1 8.9 - 10.3 mg/dL   Phosphorus 6.5 (H) 2.5 - 4.6 mg/dL   Albumin 2.2 (L) 3.5 - 5.0 g/dL   GFR calc non Af Amer 4 (L) >60 mL/min   GFR calc Af Amer 4 (L) >60 mL/min    Comment: (NOTE) The eGFR has been calculated using the CKD EPI equation. This calculation has not been validated in all clinical situations. eGFR's persistently <60 mL/min signify possible Chronic Kidney Disease.    Anion gap 12 5 - 15    Comment: Performed at [facility] [address] [place], [place] [address]  CBC     Status: Abnormal   Collection Time: 04/24/17  4:04 AM  Result Value Ref Range   WBC 13.1 (H) 4.0 - 10.5 K/uL   RBC 3.39 (L) 3.87 - 5.11 MIL/uL   Hemoglobin 9.8 (L) 12.0 - 15.0 g/dL   HCT 30.9 (L) 36.0 - 46.0 %   MCV 91.2 78.0 - 100.0 fL   MCH 28.9 26.0 - 34.0 pg   MCHC 31.7 30.0 - 36.0 g/dL   RDW 14.8 11.5 - 15.5 %   Platelets 285 150 - 400 K/uL    Comment: Performed at [facility], [place] [address] [place], [place] [address]  Renal function panel     Status: Abnormal   Collection Time: 04/25/17  4:02  AM  Result Value Ref Range   Sodium 135 135 - 145 mmol/L   Potassium 4.4 3.5 - 5.1 mmol/L   Chloride 96 (L) 101 - 111 mmol/L   CO2 23 22 - 32 mmol/L   Glucose, Bld 93 65 - 99 mg/dL   BUN 50 (H) 6 - 20 mg/dL   Creatinine, Ser 11.44 (H) 0.44 - 1.00 mg/dL   Calcium 9.4 8.9 - 10.3 mg/dL   Phosphorus 7.1 (H) 2.5 - 4.6 mg/dL   Albumin 2.2 (L) 3.5 - 5.0 g/dL   GFR calc non Af Amer 3 (L) >60 mL/min   GFR calc Af Amer 4 (L) >60 mL/min    Comment: (NOTE) The eGFR has been calculated using the CKD EPI equation. This calculation has not been validated in all clinical situations. eGFR's persistently <60 mL/min signify possible Chronic Kidney Disease.    Anion gap 16 (H) 5 - 15    Comment: Performed at [facility], [place] [address], [place], [place] [address]  CBC     Status: Abnormal   Collection Time: 04/25/17  4:02 AM  Result Value Ref Range   WBC 11.9 (H) 4.0 - 10.5 K/uL   RBC 3.40 (L) 3.87 - 5.11 MIL/uL   Hemoglobin 9.9 (L) 12.0 - 15.0 g/dL   HCT 30.9 (L) 36.0 - 46.0 %   MCV 90.9 78.0 - 100.0 fL   MCH 29.1 26.0 - 34.0 pg   MCHC 32.0 30.0 - 36.0 g/dL   RDW 14.8 11.5 - 15.5 %   Platelets 290 150 - 400 K/uL    Comment: Performed at [facility], [place] [address], [place], [place] [address]   No results found.     Medical Problem List and  Plan: 1.  Functional and mobility deficits secondary to rhabdomyolysis and encephalopathy. Pt with right foot drop and dysesthesias----?sciatic nerve injury due to prolonged down time?  Do not see consistent signs of RUE sensory loss.   -admit to inpatient rehab  -may need AFO  2.  DVT Prophylaxis/Anticoagulation: Pharmaceutical: Heparin 3. Pain Management: Fentanyl patch with tramadol prn   -add gabapentin for dysesthetic pain 4. Mood: LCSW to follow for evaluation and support.  5. Neuropsych: This patient is capable of making decisions on her  own behalf. 6. Skin/Wound Care: Pressure relief measures.  7.  Fluids/Electrolytes/Nutrition: Strict I/O with daily weights.  8. Rhabdomyolysis with acute renal failure: Continue to monitor UOP as well as labs for recovery. SCr trending downwards 11.3-->10.6-->9.7-->8.55  9. Leucocytosis: Resolving. Monitor for signs of infection. 10. Anemia: Will monitor with serial checks. H/H stable in 9-10 range.  11. Abnormal LFTs: Recheck in am. 12. Schizoaffective/depressive disorder: Continue Clozaril, Sinequan, Seroquel and Trintellix at bedtime.  13. HTN: Poorly controlled. Will monitor BP bid. Currently on Metoprolol bid. Will add amlodipine per renal notes.    Post Admission Physician Evaluation: Functional deficits secondary  to rhabdomyolysis/encephalopathy/right foot drop  1. Patient is admitted to receive collaborative, interdisciplinary care between the physiatrist, rehab nursing staff, and therapy team. 2. Patient's level of medical complexity and substantial therapy needs in context of that medical necessity cannot be provided at a lesser intensity of care such as a SNF. 3. Patient has experienced substantial functional loss from his/her baseline which was documented above under the "Functional History" and "Functional Status" headings.  Judging by the patient's diagnosis, physical exam, and functional history, the patient has potential for functional progress which will result in measurable gains while on inpatient rehab.  These gains will be of substantial and practical use upon discharge  in facilitating mobility and self-care at the household level. 4. Physiatrist will provide 24 hour management of medical needs as well as oversight of the therapy plan/treatment and provide guidance as appropriate regarding the interaction of the two. 5. The Preadmission Screening has been reviewed and patient status is unchanged unless otherwise stated above. 6. 24 hour rehab nursing will assist with bladder management, bowel management, safety, skin/wound care, disease  management, medication administration, pain management and patient education  and help integrate therapy concepts, techniques,education, etc. 7. PT will assess and treat for/with: Lower extremity strength, range of motion, stamina, balance, functional mobility, safety, adaptive techniques and equipment, NMR, pain mgt, orthotics.   Goals are: mod I. 8. OT will assess and treat for/with: ADL's, functional mobility, safety, upper extremity strength, adaptive techniques and equipment, NMR, pain mgt, ego support.   Goals are: mod I. Therapy may proceed with showering this patient. 9. SLP will assess and treat for/with: cognition, communication.  Goals are: mod I. 10. Case Management and Social Worker will assess and treat for psychological issues and discharge planning. 11. Team conference will be held weekly to assess progress toward goals and to determine barriers to discharge. 12. Patient will receive at least 3 hours of therapy per day at least 5 days per week. 13. ELOS: 11-15 days       14. Prognosis:  excellent     Meredith Staggers, MD, [facility] 04/29/2017  Bary Leriche, PA-C 04/25/2017

## 2017-04-21 NOTE — Progress Notes (Signed)
Impression: 1 Acute renal failure- found down, AKI due to ATN from severe rhabdo- SP HD beginnin 3/8.  Has Temp cath still.Continues with HD-variable UOP.  Will decide about next HD on Sat. 2 Severe depression/ schizoaffective disorder - on meds 3 s/p Resp failure -  5 HTN - stable on norvasc- some lability poss dialysis related   Subjective: Interval History: no complaints, muscle aches better-some hypotension today  Objective: Vital signs in last 24 hours: Temp:  [97.7 F (36.5 C)-98.9 F (37.2 C)] 98.7 F (37.1 C) (03/21 0534) Pulse Rate:  [110-124] 118 (03/21 0534) Resp:  [14-22] 16 (03/21 0534) BP: (102-170)/(73-99) 144/89 (03/21 0534) SpO2:  [94 %-99 %] 96 % (03/21 0534) Weight:  [119 kg (262 lb 5.6 oz)-124.7 kg (274 lb 14.6 oz)] 119 kg (262 lb 5.6 oz) (03/21 0200) Weight change: 0.959 kg (2 lb 1.8 oz)  Intake/Output from previous day: 03/20 0701 - 03/21 0700 In: 300 [P.O.:300] Out: 5000  Intake/Output this shift: No intake/output data recorded.  General appearance: alert and cooperative Resp: clear to auscultation bilaterally Cardio: regular rate and rhythm, S1, S2 normal, no murmur, click, rub or gallop Extremities: edema 1+, strength improving  Lab Results: Recent Labs    04/18/17 2056 04/20/17 0342  WBC 21.0* 17.7*  HGB 10.9* 10.4*  HCT 32.7* 31.3*  PLT 301 323   BMET:  Recent Labs    04/20/17 0342 04/21/17 0412  NA 133* 135  K 4.0 3.4*  CL 96* 96*  CO2 25 26  GLUCOSE 119* 135*  BUN 32* 15  CREATININE 8.97* 5.87*  CALCIUM 8.3* 8.0*   No results for input(s): PTH in the last 72 hours. Iron Studies: No results for input(s): IRON, TIBC, TRANSFERRIN, FERRITIN in the last 72 hours. Studies/Results: No results found.  Scheduled: . acetaminophen  650 mg Oral TID  . amLODipine  5 mg Oral Daily  . calcium acetate  667 mg Oral TID WC  . Chlorhexidine Gluconate Cloth  6 each Topical Q0600  . cloZAPine  400 mg Oral QHS  . darbepoetin  (ARANESP) injection - DIALYSIS  100 mcg Intravenous Q Fri-HD  . diclofenac sodium  4 g Topical QID  . doxepin  50 mg Oral QHS  . fentaNYL  25 mcg Transdermal Q48H  . heparin injection (subcutaneous)  5,000 Units Subcutaneous Q8H  . LORazepam  1 mg Intravenous Once  . QUEtiapine  400 mg Oral QHS  . vortioxetine HBr  20 mg Oral QHS     LOS: 16 days   [name] 04/21/2017,1:20 PM

## 2017-04-22 LAB — CBC
HCT: 31.1 % — ABNORMAL LOW (ref 36.0–46.0)
Hemoglobin: 10 g/dL — ABNORMAL LOW (ref 12.0–15.0)
MCH: 29.2 pg (ref 26.0–34.0)
MCHC: 32.2 g/dL (ref 30.0–36.0)
MCV: 90.9 fL (ref 78.0–100.0)
PLATELETS: 275 10*3/uL (ref 150–400)
RBC: 3.42 MIL/uL — ABNORMAL LOW (ref 3.87–5.11)
RDW: 14.5 % (ref 11.5–15.5)
WBC: 14 10*3/uL — ABNORMAL HIGH (ref 4.0–10.5)

## 2017-04-22 LAB — RENAL FUNCTION PANEL
Albumin: 2.2 g/dL — ABNORMAL LOW (ref 3.5–5.0)
Anion gap: 13 (ref 5–15)
BUN: 25 mg/dL — AB (ref 6–20)
CALCIUM: 8.6 mg/dL — AB (ref 8.9–10.3)
CHLORIDE: 97 mmol/L — AB (ref 101–111)
CO2: 25 mmol/L (ref 22–32)
CREATININE: 8.07 mg/dL — AB (ref 0.44–1.00)
GFR calc Af Amer: 6 mL/min — ABNORMAL LOW (ref >60)
GFR calc non Af Amer: 5 mL/min — ABNORMAL LOW (ref >60)
Glucose, Bld: 95 mg/dL (ref 65–99)
Phosphorus: 5.4 mg/dL — ABNORMAL HIGH (ref 2.5–4.6)
Potassium: 4 mmol/L (ref 3.5–5.1)
SODIUM: 135 mmol/L (ref 135–145)

## 2017-04-22 MED ORDER — PANTOPRAZOLE SODIUM 40 MG PO TBEC
40.0000 mg | DELAYED_RELEASE_TABLET | Freq: Two times a day (BID) | ORAL | Status: DC
  Administered 2017-04-22 – 2017-04-29 (×14): 40 mg via ORAL
  Filled 2017-04-22 (×16): qty 1

## 2017-04-22 NOTE — Progress Notes (Signed)
VAST/IV responding for routine line checks. Trialysis cath dressing noted to be saturated with cold blood falling outside of prescribed policy. Dressing was changed per policy using sterile technique.   Notable finding: stage 1 skin breakdown noted underneath the biopatch. Md Mikhail notified. No new orders received

## 2017-04-22 NOTE — Care Management Note (Addendum)
Case Management Note  Patient Details  Name: Jenna Hill MRN: [id number] Date of Birth: May 04, 1970  Subjective/Objective:       Admitted with acute encephalopathy from home on 3/5 obtunded with hypothermia & tachycardia , hx of suicide attempt, bipolar, schizophrenia, hypertension. Lives alone. No family support in [place]. Family (mom/siblings) lives in [place].   Hospital course-  Acute renal failure/ AKI due to ATN from severe rhabdo-  HD required   Jenna Hill (Mother) Jenna Hill [id number]     PCP: Fredia Beets  Action/Plan:  CIR when medically ready...pt still requiring dialysis...Marland KitchenCM will continue to follow for disposition needs.  Expected Discharge Date:  (unknown)               Expected Discharge Plan:  Home/Self Care  In-House Referral:     Discharge planning Services  CM Consult  Post Acute Care Choice:    Choice offered to:     DME Arranged:    DME Agency:     HH Arranged:    HH Agency:     Status of Service:  In process, will continue to follow  If discussed at Long Length of Stay Meetings, dates discussed:    Additional Comments:  Sharin Mons, RN 04/22/2017, 3:07 PM

## 2017-04-22 NOTE — Progress Notes (Signed)
PROGRESS NOTE    Jenna Hill  FYB:[id number] DOB: 10-31-1970 DOA: 04/05/2017 PCP: Alfonse Spruce, FNP   Brief Narrative:  47 year old with history of bipolar disorder, schizophrenia, hypertension who presented to the hospital on 04/05/2017 obtunded with hypothermia and tachycardia.  Narcan was given which did not help.  Patient was then intubated for airway protection.  Her chest x-ray did show some vascular congestion, patient received 4 days of Unasyn for possible aspiration.  Patient was given aggressive IV hydration for renal injury.  Nephrology was consulted and appreciated, patient was placed on dialysis.  Patient was extubated on 04/08/2017.  She was also placed on Unasyn for presumed cellulitis of the right lower extremity.  She had CT of the hip which showed some edema versus infection, she was evaluated by orthopedics, there was no concern for compartment syndrome.  Patient did develop some abdominal pain, CTs scan showed concern for pancreatitis.  She is noted to have gallstones on ultrasound however no evidence of cholecystitis.  Patient will eventually need GI and surgical outpatient follow-ups.  Assessment & Plan   Acute metabolic encephalopathy -Resolved, currently AAOx3 -Possibly related to medication overdose and renal failure -CT head unremarkable -UDS and APAP, salicylate negative  Bipolar/schizoaffective disorder -Psychiatry consulted and appreciated as patient hearing voices.  -Continue clozapine, with gradual increase to 400mg  daily (to avoid myocarditis) -patient does not need inpatient psych admission -continue abilify and doxepin   Tachycardia  -possible secondary to anemia, renal failure, pain -continue to monitor   Acute respiratory failure -Required intubation on admission, was extubated on 04/08/2017 -Respiratory viral panel negative -Respiratory culture shows no growth -CXR x-ray showed left lower lobe consolidation -Treated with  Unasyn  Rhabdomyolysis with severe swelling of the right lower extremity/pain -CK improved and trending downward -CT on 04/09/2017 showed moderate subcutaneous edema and fat stranding about the right lateral hip and circumferentially through the thigh, nonspecific, but can be seen with cellulitis.  No drainable fluid collection. -Right lower extremity venous Doppler negative -Patient continues to have pain and has been using IV fentanyl frequently, over 300 mcg in 1 day -Patient has intolerance to tramadol, Percocet, Vicodin and NSAIDs -Continue fentanyl patch with tramadol PRN  -Orthopedics consulted and appreciated, no concern for compartment syndrome  Acute kidney injury/ATN -Secondary to the above -Neurology consulted and appreciated- will reassess need for further HD on 04/23/2017 -last hemodialysis session on 04/20/2017 -Creatinine currently 8.07 -Continue to monitor BMP  Elevated transaminases/? Pancreatitis -Suspect secondary to shock liver versus rhabdomyolysis -LFTs have improved -Ultrasound with cholelithiasis-suspect patient will need outpatient surgery or GI follow-up -Hepatitis B negative -CT abdomen showed possible pancreatitis.  Lipase was negative.  Patient denied abdominal pain.  Leukocytosis -WBC improving, currently  14 -CT hip with edema, possible cellulitis -Completed Ancef course -Blood Cultures show no growth to date -Urine culture shows insignificant growth -Upon review of patient's chart, patient has had leukocytosis for the past several years, she will need hematology outpatient follow-up -continue to monitor CBC  Hypertriglyceridemia -will need lopid once rhabdo has resolved  Chronic anemia -Hemoglobin dropped to 6, patient received 2 units PRBC on 04/18/2017 -Baseline hemoglobin approximately 9-10, currently 10 -Continue aranesp  Essential hypertension -continue amlodipine and IV hydralazine as needed  Physical deconditioning -PT recommended  inpatient rehab -CIR consulted- pending decision and recommendations regarding dialysis   Vomiting -denies nausea, occurs in the morning only -continue zofran PRN -will add on PPI  DVT Prophylaxis  heparin  Code Status: Full  Family Communication: none at  bedside  Disposition Plan: Admitted. Pending CIR.  Consultants PCCM Nephrology Psychiatry  Orthopedics Inpatient rehab  Procedures  Intubation/extubation Insertion of HD catheter  Antibiotics   Anti-infectives (From admission, onward)   Start     Dose/Rate Route Frequency Ordered Stop   04/13/17 2000  ceFAZolin (ANCEF) IVPB 1 g/50 mL premix     1 g 100 mL/hr over 30 Minutes Intravenous Every 24 hours 04/13/17 1401 04/19/17 2053   04/09/17 2000  Ampicillin-Sulbactam (UNASYN) 3 g in sodium chloride 0.9 % 100 mL IVPB  Status:  Discontinued     3 g 200 mL/hr over 30 Minutes Intravenous Every 24 hours 04/09/17 1119 04/11/17 1134   04/08/17 0600  Ampicillin-Sulbactam (UNASYN) 3 g in sodium chloride 0.9 % 100 mL IVPB  Status:  Discontinued     3 g 200 mL/hr over 30 Minutes Intravenous Every 24 hours 04/07/17 0719 04/09/17 1119   04/06/17 1800  Ampicillin-Sulbactam (UNASYN) 3 g in sodium chloride 0.9 % 100 mL IVPB  Status:  Discontinued     3 g 200 mL/hr over 30 Minutes Intravenous Every 12 hours 04/06/17 0942 04/07/17 0719   04/05/17 1800  Ampicillin-Sulbactam (UNASYN) 3 g in sodium chloride 0.9 % 100 mL IVPB  Status:  Discontinued     3 g 200 mL/hr over 30 Minutes Intravenous Every 6 hours 04/05/17 1620 04/06/17 0942   04/05/17 1630  Ampicillin-Sulbactam (UNASYN) 3 g in sodium chloride 0.9 % 100 mL IVPB     3 g 200 mL/hr over 30 Minutes Intravenous  Once 04/05/17 1608 04/05/17 1844      Subjective:   Jenna Hill seen and examined today.  Continues to have vomiting episodes in the morning. Denies current abdominal pain or nausea. Denies current chest pain, shortness of breath, dizziness, headache. Does complain of  pain in her right leg, but feels it is improving slowly.  Objective:   Vitals:   04/21/17 1341 04/21/17 2130 04/22/17 0525 04/22/17 1010  BP: (!) 156/83 (!) 151/91 (!) 153/85 (!) 163/93  Pulse: (!) 126 (!) 124 (!) 117   Resp: 20 18 18    Temp: 98.2 F (36.8 C) 99.4 F (37.4 C) 98.6 F (37 C)   TempSrc: Oral Oral Oral   SpO2: 96% 93% 95%   Weight:   124.9 kg (275 lb 5.7 oz)   Height:       No intake or output data in the 24 hours ending 04/22/17 1202 Filed Weights   04/21/17 0129 04/21/17 0200 04/22/17 0525  Weight: 119.4 kg (263 lb 3.7 oz) 119 kg (262 lb 5.6 oz) 124.9 kg (275 lb 5.7 oz)   Exam  General: Well developed, well nourished, NAD, appears stated age  48: NCAT, mucous membranes moist.   Neck: Supple  Cardiovascular: S1 S2 auscultated, tachycardic   Respiratory: Clear to auscultation bilaterally with equal chest rise  Abdomen: Soft, nontender, nondistended, + bowel sounds  Extremities: warm dry without cyanosis clubbing. RLE with mild edema, improving pain  Neuro: AAOx3, nonfocal  Psych: Normal affect and demeanor with intact judgement and insight  Data Reviewed: I have personally reviewed following labs and imaging studies  CBC: Recent Labs  Lab 04/17/17 0410 04/18/17 0423 04/18/17 2056 04/20/17 0342 04/22/17 0403  WBC 24.2* 16.6* 21.0* 17.7* 14.0*  HGB 8.3* 6.8* 10.9* 10.4* 10.0*  HCT 25.2* 20.2* 32.7* 31.3* 31.1*  MCV 87.5 89.0 88.4 88.9 90.9  PLT 356 300 301 323 546   Basic Metabolic Panel: Recent Labs  Lab 04/18/17  9381 04/19/17 0359 04/20/17 0342 04/21/17 0412 04/22/17 0403  NA 131* 131* 133* 135 135  K 4.3 3.9 4.0 3.4* 4.0  CL 93* 94* 96* 96* 97*  CO2 25 24 25 26 25   GLUCOSE 96 123* 119* 135* 95  BUN 40* 25* 32* 15 25*  CREATININE 9.77* 7.35* 8.97* 5.87* 8.07*  CALCIUM 8.3* 8.1* 8.3* 8.0* 8.6*  PHOS 5.9* 3.8 4.6 3.4 5.4*   GFR: Estimated Creatinine Clearance: 11.9 mL/min (A) (by C-G formula based on SCr of 8.07 mg/dL  (H)). Liver Function Tests: Recent Labs  Lab 04/18/17 0423 04/19/17 0359 04/20/17 0342 04/21/17 0412 04/22/17 0403  AST 43*  --   --   --   --   ALT 9*  --   --   --   --   ALKPHOS 75  --   --   --   --   BILITOT 0.4  --   --   --   --   PROT 5.1*  --   --   --   --   ALBUMIN 2.0*  2.0* 2.2* 2.3* 2.2* 2.2*   No results for input(s): LIPASE, AMYLASE in the last 168 hours. No results for input(s): AMMONIA in the last 168 hours. Coagulation Profile: No results for input(s): INR, PROTIME in the last 168 hours. Cardiac Enzymes: Recent Labs  Lab 04/18/17 0423  CKTOTAL 831*   BNP (last 3 results) No results for input(s): PROBNP in the last 8760 hours. HbA1C: No results for input(s): HGBA1C in the last 72 hours. CBG: Recent Labs  Lab 04/15/17 1854  GLUCAP 106*   Lipid Profile: No results for input(s): CHOL, HDL, LDLCALC, TRIG, CHOLHDL, LDLDIRECT in the last 72 hours. Thyroid Function Tests: No results for input(s): TSH, T4TOTAL, FREET4, T3FREE, THYROIDAB in the last 72 hours. Anemia Panel: No results for input(s): VITAMINB12, FOLATE, FERRITIN, TIBC, IRON, RETICCTPCT in the last 72 hours. Urine analysis:    Component Value Date/Time   COLORURINE BROWN (A) 04/11/2017 1814   APPEARANCEUR TURBID (A) 04/11/2017 1814   LABSPEC 1.020 04/11/2017 1814   PHURINE 7.0 04/11/2017 1814   GLUCOSEU NEGATIVE 04/11/2017 1814   HGBUR LARGE (A) 04/11/2017 1814   BILIRUBINUR SMALL (A) 04/11/2017 1814   KETONESUR NEGATIVE 04/11/2017 1814   PROTEINUR 100 (A) 04/11/2017 1814   UROBILINOGEN 0.2 08/02/2008 0812   NITRITE POSITIVE (A) 04/11/2017 1814   LEUKOCYTESUR TRACE (A) 04/11/2017 1814   Sepsis Labs: @LABRCNTIP (procalcitonin:4,lacticidven:4)  ) Recent Results (from the past 240 hour(s))  Culture, blood (Routine X 2) w Reflex to ID Panel     Status: None   Collection Time: 04/12/17  6:26 PM  Result Value Ref Range Status   Specimen Description BLOOD RIGHT ARM  Final   Special  Requests   Final    BOTTLES DRAWN AEROBIC AND ANAEROBIC Blood Culture adequate volume   Culture   Final    NO GROWTH 5 DAYS Performed at [facility], [place] [address], [place], [place] [address]    Report Status 04/17/2017 FINAL  Final  Culture, blood (Routine X 2) w Reflex to ID Panel     Status: None   Collection Time: 04/12/17  6:40 PM  Result Value Ref Range Status   Specimen Description BLOOD RIGHT HAND  Final   Special Requests IN PEDIATRIC BOTTLE Blood Culture adequate volume  Final   Culture   Final    NO GROWTH 5 DAYS Performed at [facility], [place]  [address], [place], [place] [address]    Report Status 04/17/2017 FINAL  Final      Radiology Studies: No results found.   Scheduled Meds: . acetaminophen  650 mg Oral TID  . amLODipine  5 mg Oral Daily  . calcium acetate  667 mg Oral TID WC  . Chlorhexidine Gluconate Cloth  6 each Topical Q0600  . cloZAPine  400 mg Oral QHS  . darbepoetin (ARANESP) injection - NON-DIALYSIS  100 mcg Subcutaneous Q Fri-1800  . diclofenac sodium  4 g Topical QID  . doxepin  50 mg Oral QHS  . fentaNYL  25 mcg Transdermal Q48H  . heparin injection (subcutaneous)  5,000 Units Subcutaneous Q8H  . LORazepam  1 mg Intravenous Once  . pantoprazole  40 mg Oral BID AC  . QUEtiapine  400 mg Oral QHS  . vortioxetine HBr  20 mg Oral QHS   Continuous Infusions: . sodium chloride    . sodium chloride       LOS: 17 days   Time Spent in minutes   30 minutes  Jenna Hill D.O. on 04/22/2017 at 12:02 PM  Between 7am to 7pm - Pager - [phone]  After 7pm go to [email or web address] - password [email or web address]  And look for the night coverage person covering for me after hours  [facility] Office  [phone]

## 2017-04-22 NOTE — Progress Notes (Signed)
[facility] ROUNDING NOTE   Subjective:   Interval History:  This is a very pleasant lady with bipolar disorder, schizophrenia, hypertension who presented to the hospital on 04/05/2017 obtunded with hypothermia and tachycardia.  It appears that she has had severe rhabdomyolysis and started dialysis 3/8   We shall watch for recovery    Objective:  Vital signs in last 24 hours:  Temp:  [98.2 F (36.8 C)-99.4 F (37.4 C)] 98.6 F (37 C) (03/22 0525) Pulse Rate:  [117-126] 117 (03/22 0525) Resp:  [18-20] 18 (03/22 0525) BP: (151-163)/(83-93) 163/93 (03/22 1010) SpO2:  [93 %-96 %] 95 % (03/22 0525) Weight:  [275 lb 5.7 oz (124.9 kg)] 275 lb 5.7 oz (124.9 kg) (03/22 0525)  Weight change: 7.1 oz (0.2 kg) Filed Weights   04/21/17 0129 04/21/17 0200 04/22/17 0525  Weight: 263 lb 3.7 oz (119.4 kg) 262 lb 5.6 oz (119 kg) 275 lb 5.7 oz (124.9 kg)    Intake/Output: I/O last 3 completed shifts: In: -  Out: 5000 [Other:5000]   Intake/Output this shift:  No intake/output data recorded.  CVS- RRR RS- CTA ABD- BS present soft non-distended EXT- no edema   Basic Metabolic Panel: Recent Labs  Lab 04/18/17 0423 04/19/17 0359 04/20/17 0342 04/21/17 0412 04/22/17 0403  NA 131* 131* 133* 135 135  K 4.3 3.9 4.0 3.4* 4.0  CL 93* 94* 96* 96* 97*  CO2 25 24 25 26 25   GLUCOSE 96 123* 119* 135* 95  BUN 40* 25* 32* 15 25*  CREATININE 9.77* 7.35* 8.97* 5.87* 8.07*  CALCIUM 8.3* 8.1* 8.3* 8.0* 8.6*  PHOS 5.9* 3.8 4.6 3.4 5.4*    Liver Function Tests: Recent Labs  Lab 04/18/17 0423 04/19/17 0359 04/20/17 0342 04/21/17 0412 04/22/17 0403  AST 43*  --   --   --   --   ALT 9*  --   --   --   --   ALKPHOS 75  --   --   --   --   BILITOT 0.4  --   --   --   --   PROT 5.1*  --   --   --   --   ALBUMIN 2.0*  2.0* 2.2* 2.3* 2.2* 2.2*   No results for input(s): LIPASE, AMYLASE in the last 168 hours. No results for input(s): AMMONIA in the last 168  hours.  CBC: Recent Labs  Lab 04/17/17 0410 04/18/17 0423 04/18/17 2056 04/20/17 0342 04/22/17 0403  WBC 24.2* 16.6* 21.0* 17.7* 14.0*  HGB 8.3* 6.8* 10.9* 10.4* 10.0*  HCT 25.2* 20.2* 32.7* 31.3* 31.1*  MCV 87.5 89.0 88.4 88.9 90.9  PLT 356 300 301 323 275    Cardiac Enzymes: Recent Labs  Lab 04/18/17 0423  CKTOTAL 831*    BNP: Invalid input(s): POCBNP  CBG: Recent Labs  Lab 04/15/17 1854  GLUCAP 106*    Microbiology: Results for orders placed or performed during the hospital encounter of 04/05/17  Respiratory Panel by PCR     Status: None   Collection Time: 04/05/17  4:01 PM  Result Value Ref Range Status   Adenovirus NOT DETECTED NOT DETECTED Final   Coronavirus 229E NOT DETECTED NOT DETECTED Final   Coronavirus HKU1 NOT DETECTED NOT DETECTED Final   Coronavirus NL63 NOT DETECTED NOT DETECTED Final   Coronavirus OC43 NOT DETECTED NOT DETECTED Final   Metapneumovirus NOT DETECTED NOT DETECTED Final   Rhinovirus / Enterovirus NOT DETECTED NOT DETECTED Final   Influenza  A NOT DETECTED NOT DETECTED Final   Influenza B NOT DETECTED NOT DETECTED Final   Parainfluenza Virus 1 NOT DETECTED NOT DETECTED Final   Parainfluenza Virus 2 NOT DETECTED NOT DETECTED Final   Parainfluenza Virus 3 NOT DETECTED NOT DETECTED Final   Parainfluenza Virus 4 NOT DETECTED NOT DETECTED Final   Respiratory Syncytial Virus NOT DETECTED NOT DETECTED Final   Bordetella pertussis NOT DETECTED NOT DETECTED Final   Chlamydophila pneumoniae NOT DETECTED NOT DETECTED Final   Mycoplasma pneumoniae NOT DETECTED NOT DETECTED Final    Comment: Performed at [facility], [place] [address] [place], [place] [address]  Culture, blood (routine x 2)     Status: None   Collection Time: 04/05/17  5:51 PM  Result Value Ref Range Status   Specimen Description   Final    BLOOD RIGHT HAND Performed at [facility] [address], [place], [place] [address]    Special Requests    Final    IN PEDIATRIC BOTTLE Blood Culture adequate volume Performed at [facility] [address] [place], [place] [address]    Culture   Final    NO GROWTH 5 DAYS Performed at [facility], [place] [address] [place], [place] [address]    Report Status 04/10/2017 FINAL  Final  Culture, blood (routine x 2)     Status: None   Collection Time: 04/05/17  5:51 PM  Result Value Ref Range Status   Specimen Description   Final    BLOOD RIGHT HAND Performed at [facility] [address] [place], [place] [address]    Special Requests   Final    IN PEDIATRIC BOTTLE Blood Culture adequate volume Performed at [facility] [address] [place], [place] [address]    Culture   Final    NO GROWTH 5 DAYS Performed at [facility], [place] [address], [place], [place] [address]    Report Status 04/10/2017 FINAL  Final  MRSA PCR Screening     Status: None   Collection Time: 04/05/17  6:13 PM  Result Value Ref Range Status   MRSA by PCR NEGATIVE NEGATIVE Final    Comment:        The GeneXpert MRSA Assay (FDA approved for NASAL specimens only), is one component of a comprehensive MRSA colonization surveillance program. It is not intended to diagnose MRSA infection nor to guide or monitor treatment for MRSA infections. Performed at [facility], [place] [address] [place], [place] [address]   Culture, Urine     Status: Abnormal   Collection Time: 04/11/17 10:49 PM  Result Value Ref Range Status   Specimen Description URINE, CLEAN CATCH  Final   Special Requests NONE  Final   Culture (A)  Final    <10,000 COLONIES/mL INSIGNIFICANT GROWTH Performed at [facility], [place] [address], [place],  [address]    Report Status 04/13/2017 FINAL  Final  Culture, blood (Routine X 2) w Reflex to ID Panel     Status: None   Collection Time: 04/12/17  6:26 PM  Result Value Ref Range Status   Specimen  Description BLOOD RIGHT ARM  Final   Special Requests   Final    BOTTLES DRAWN AEROBIC AND ANAEROBIC Blood Culture adequate volume   Culture   Final    NO GROWTH 5 DAYS Performed at [facility], [place] [address], [place], [place]  [address]    Report Status 04/17/2017 FINAL  Final  Culture, blood (Routine X 2) w Reflex to ID Panel     Status: None   Collection Time: 04/12/17  6:40 PM  Result Value Ref Range Status   Specimen Description BLOOD RIGHT HAND  Final   Special Requests IN PEDIATRIC BOTTLE Blood Culture adequate volume  Final   Culture   Final    NO GROWTH 5 DAYS Performed at [facility], [place] [address], [place], [place] [address]    Report Status 04/17/2017 FINAL  Final    Coagulation Studies: No results for input(s): LABPROT, INR in the last 72 hours.  Urinalysis: No results for input(s): COLORURINE, LABSPEC, PHURINE, GLUCOSEU, HGBUR, BILIRUBINUR, KETONESUR, PROTEINUR, UROBILINOGEN, NITRITE, LEUKOCYTESUR in the last 72 hours.  Invalid input(s): APPERANCEUR    Imaging: No results found.   Medications:   . sodium chloride    . sodium chloride     . acetaminophen  650 mg Oral TID  . amLODipine  5 mg Oral Daily  . calcium acetate  667 mg Oral TID WC  . Chlorhexidine Gluconate Cloth  6 each Topical Q0600  . cloZAPine  400 mg Oral QHS  . darbepoetin (ARANESP) injection - NON-DIALYSIS  100 mcg Subcutaneous Q Fri-1800  . diclofenac sodium  4 g Topical QID  . doxepin  50 mg Oral QHS  . fentaNYL  25 mcg Transdermal Q48H  . heparin injection (subcutaneous)  5,000 Units Subcutaneous Q8H  . LORazepam  1 mg Intravenous Once  . pantoprazole  40 mg Oral BID AC  . QUEtiapine  400 mg Oral QHS  . vortioxetine HBr  20 mg Oral QHS   acetaminophen, hydrALAZINE, ondansetron (ZOFRAN) IV, traMADol  Assessment/ Plan:  1 Acute renal failure- found down, AKI due to ATN from severe rhabdo- SP HD beginnin 3/8.Has Temp cath still.Continues with HD-variable UOP.   Will monitor and no dialysis today 2 Severe depression/ schizoaffective disorder - on meds 3 s/p Resp failure -  5 HTN - stable on norvasc- some lability poss dialysis related      LOS: 17 Jerrion Tabbert W @TODAY @12 :06 PM

## 2017-04-23 LAB — CBC
HCT: 31.4 % — ABNORMAL LOW (ref 36.0–46.0)
Hemoglobin: 10.3 g/dL — ABNORMAL LOW (ref 12.0–15.0)
MCH: 30 pg (ref 26.0–34.0)
MCHC: 32.8 g/dL (ref 30.0–36.0)
MCV: 91.5 fL (ref 78.0–100.0)
PLATELETS: 300 10*3/uL (ref 150–400)
RBC: 3.43 MIL/uL — ABNORMAL LOW (ref 3.87–5.11)
RDW: 15 % (ref 11.5–15.5)
WBC: 12.7 10*3/uL — AB (ref 4.0–10.5)

## 2017-04-23 LAB — RENAL FUNCTION PANEL
Albumin: 2.2 g/dL — ABNORMAL LOW (ref 3.5–5.0)
Anion gap: 13 (ref 5–15)
BUN: 33 mg/dL — AB (ref 6–20)
CALCIUM: 8.8 mg/dL — AB (ref 8.9–10.3)
CO2: 24 mmol/L (ref 22–32)
CREATININE: 9.81 mg/dL — AB (ref 0.44–1.00)
Chloride: 97 mmol/L — ABNORMAL LOW (ref 101–111)
GFR calc Af Amer: 5 mL/min — ABNORMAL LOW (ref >60)
GFR, EST NON AFRICAN AMERICAN: 4 mL/min — AB (ref >60)
Glucose, Bld: 86 mg/dL (ref 65–99)
Phosphorus: 6.3 mg/dL — ABNORMAL HIGH (ref 2.5–4.6)
Potassium: 4 mmol/L (ref 3.5–5.1)
SODIUM: 134 mmol/L — AB (ref 135–145)

## 2017-04-23 MED ORDER — METOPROLOL TARTRATE 50 MG PO TABS
50.0000 mg | ORAL_TABLET | Freq: Two times a day (BID) | ORAL | Status: DC
  Administered 2017-04-23 – 2017-04-26 (×8): 50 mg via ORAL
  Filled 2017-04-23 (×8): qty 1

## 2017-04-23 NOTE — Progress Notes (Signed)
Impression: 1 Acute renal failure- found down, AKI due to ATN from severe rhabdo- SP HD beginnin 3/8.Has Temp cath still.Continues with HD-variable UOP.  I don't like the tachycardia so will hold HD and check CBC 2 Severe depression/ schizoaffective disorder - on meds 3 s/p Resp failure -  5 HTN - stable on norvasc-  6 Sinus tachycardia: ? Amlodipine, ? Psych meds,? ABLA--Stop amlodipine, begin metoprolol  Subjective: Interval History: Fair appetite  Objective: Vital signs in last 24 hours: Temp:  [99.1 F (37.3 C)-99.6 F (37.6 C)] 99.1 F (37.3 C) (03/23 0553) Pulse Rate:  [122-147] 130 (03/23 0659) Resp:  [18-19] 19 (03/23 0553) BP: (153-168)/(84-99) 153/84 (03/23 0823) SpO2:  [95 %-97 %] 97 % (03/23 0553) Weight:  [118.1 kg (260 lb 5.8 oz)] 118.1 kg (260 lb 5.8 oz) (03/23 0553) Weight change: -6.8 kg (-14 lb 15.9 oz)  Intake/Output from previous day: 03/22 0701 - 03/23 0700 In: 940 [P.O.:940] Out: -  Intake/Output this shift: Total I/O In: 120 [P.O.:120] Out: -   General appearance: alert and cooperative Resp: clear to auscultation bilaterally Cardio: tachycardic Extremities: edema 1+  Lab Results: Recent Labs    04/22/17 0403  WBC 14.0*  HGB 10.0*  HCT 31.1*  PLT 275   BMET:  Recent Labs    04/22/17 0403 04/23/17 0400  NA 135 134*  K 4.0 4.0  CL 97* 97*  CO2 25 24  GLUCOSE 95 86  BUN 25* 33*  CREATININE 8.07* 9.81*  CALCIUM 8.6* 8.8*   No results for input(s): PTH in the last 72 hours. Iron Studies: No results for input(s): IRON, TIBC, TRANSFERRIN, FERRITIN in the last 72 hours. Studies/Results: No results found.  Scheduled: . acetaminophen  650 mg Oral TID  . amLODipine  5 mg Oral Daily  . calcium acetate  667 mg Oral TID WC  . Chlorhexidine Gluconate Cloth  6 each Topical Q0600  . cloZAPine  400 mg Oral QHS  . darbepoetin (ARANESP) injection - NON-DIALYSIS  100 mcg Subcutaneous Q Fri-1800  . diclofenac sodium  4 g Topical QID   . doxepin  50 mg Oral QHS  . fentaNYL  25 mcg Transdermal Q48H  . heparin injection (subcutaneous)  5,000 Units Subcutaneous Q8H  . LORazepam  1 mg Intravenous Once  . pantoprazole  40 mg Oral BID AC  . QUEtiapine  400 mg Oral QHS  . vortioxetine HBr  20 mg Oral QHS     LOS: 18 days   [name] 04/23/2017,10:48 AM

## 2017-04-23 NOTE — Progress Notes (Signed)
Finally was able to get patient oob to bsc and then to chair. Didn't ask, told her this is what we need to do and she said ok. Urinated 350cc.

## 2017-04-23 NOTE — Progress Notes (Signed)
PROGRESS NOTE    Jenna Hill  NOM:[id number] DOB: 18-Dec-1970 DOA: 04/05/2017 PCP: Alfonse Spruce, FNP   Brief Narrative:  47 year old with history of bipolar disorder, schizophrenia, hypertension who presented to the hospital on 04/05/2017 obtunded with hypothermia and tachycardia.  Narcan was given which did not help.  Patient was then intubated for airway protection.  Her chest x-ray did show some vascular congestion, patient received 4 days of Unasyn for possible aspiration.  Patient was given aggressive IV hydration for renal injury.  Nephrology was consulted and appreciated, patient was placed on dialysis.  Patient was extubated on 04/08/2017.  She was also placed on Unasyn for presumed cellulitis of the right lower extremity.  She had CT of the hip which showed some edema versus infection, she was evaluated by orthopedics, there was no concern for compartment syndrome.  Patient did develop some abdominal pain, CTs scan showed concern for pancreatitis.  She is noted to have gallstones on ultrasound however no evidence of cholecystitis.  Patient will eventually need GI and surgical outpatient follow-ups.  Assessment & Plan   Acute metabolic encephalopathy -Resolved, currently AAOx3 -Possibly related to medication overdose and renal failure -CT head unremarkable -UDS and APAP, salicylate negative  Bipolar/schizoaffective disorder -Psychiatry consulted and appreciated as patient hearing voices.  -Continue clozapine, with gradual increase to 400mg  daily (to avoid myocarditis) -patient does not need inpatient psych admission -continue abilify and doxepin   Tachycardia  -Unknown etiology, possible secondary to anemia, renal failure, pain -TSH checked earlier this admission, WNL  -Echocardiogram EF 9628%, grade 1 diastolic dysfunction -placed on metoprolol by nephrology -continue to monitor   Acute respiratory failure -Required intubation on admission, was extubated on  04/08/2017 -Respiratory viral panel negative -Respiratory culture shows no growth -CXR x-ray showed left lower lobe consolidation -Treated with Unasyn  Rhabdomyolysis with severe swelling of the right lower extremity/pain -CK improved and trending downward -CT on 04/09/2017 showed moderate subcutaneous edema and fat stranding about the right lateral hip and circumferentially through the thigh, nonspecific, but can be seen with cellulitis.  No drainable fluid collection. -Right lower extremity venous Doppler negative -Patient continues to have pain and has been using IV fentanyl frequently, over 300 mcg in 1 day -Patient has intolerance to tramadol, Percocet, Vicodin and NSAIDs -Continue fentanyl patch with tramadol PRN  -Orthopedics consulted and appreciated, no concern for compartment syndrome  Acute kidney injury/ATN -Secondary to the above -Neurology consulted and appreciated -last hemodialysis session on 04/20/2017 -Creatinine currently 9.81 -Continue to monitor BMP  Elevated transaminases/? Pancreatitis -Suspect secondary to shock liver versus rhabdomyolysis -LFTs have improved -Ultrasound with cholelithiasis-suspect patient will need outpatient surgery or GI follow-up -Hepatitis B negative -CT abdomen showed possible pancreatitis.  Lipase was negative.  Patient denied abdominal pain.  Leukocytosis -WBC improving, currently  14 -CT hip with edema, possible cellulitis -Completed Ancef course -Blood Cultures show no growth to date -Urine culture shows insignificant growth -Upon review of patient's chart, patient has had leukocytosis for the past several years, she will need hematology outpatient follow-up -continue to monitor CBC- pending CBC today  Hypertriglyceridemia -will need lopid once rhabdo has resolved  Chronic anemia -Hemoglobin dropped to 6, patient received 2 units PRBC on 04/18/2017 -Baseline hemoglobin approximately 9-10 -pending CBC today -Continue  aranesp  Essential hypertension -amlodipine discontinued today, placed on metoprolol given tachycardia  Physical deconditioning -PT recommended inpatient rehab -CIR consulted- pending decision and recommendations regarding dialysis   Vomiting -denies nausea, occurs in the morning only -has not had  reoccurrence this morning  -continue zofran PRN and PPI  DVT Prophylaxis  heparin  Code Status: Full  Family Communication: none at bedside  Disposition Plan: Admitted. Pending CIR.  Consultants PCCM Nephrology Psychiatry  Orthopedics Inpatient rehab  Procedures  Intubation/extubation Insertion of HD catheter  Antibiotics   Anti-infectives (From admission, onward)   Start     Dose/Rate Route Frequency Ordered Stop   04/13/17 2000  ceFAZolin (ANCEF) IVPB 1 g/50 mL premix     1 g 100 mL/hr over 30 Minutes Intravenous Every 24 hours 04/13/17 1401 04/19/17 2053   04/09/17 2000  Ampicillin-Sulbactam (UNASYN) 3 g in sodium chloride 0.9 % 100 mL IVPB  Status:  Discontinued     3 g 200 mL/hr over 30 Minutes Intravenous Every 24 hours 04/09/17 1119 04/11/17 1134   04/08/17 0600  Ampicillin-Sulbactam (UNASYN) 3 g in sodium chloride 0.9 % 100 mL IVPB  Status:  Discontinued     3 g 200 mL/hr over 30 Minutes Intravenous Every 24 hours 04/07/17 0719 04/09/17 1119   04/06/17 1800  Ampicillin-Sulbactam (UNASYN) 3 g in sodium chloride 0.9 % 100 mL IVPB  Status:  Discontinued     3 g 200 mL/hr over 30 Minutes Intravenous Every 12 hours 04/06/17 0942 04/07/17 0719   04/05/17 1800  Ampicillin-Sulbactam (UNASYN) 3 g in sodium chloride 0.9 % 100 mL IVPB  Status:  Discontinued     3 g 200 mL/hr over 30 Minutes Intravenous Every 6 hours 04/05/17 1620 04/06/17 0942   04/05/17 1630  Ampicillin-Sulbactam (UNASYN) 3 g in sodium chloride 0.9 % 100 mL IVPB     3 g 200 mL/hr over 30 Minutes Intravenous  Once 04/05/17 1608 04/05/17 1844      Subjective:   Jenna Hill seen and examined today.   His further vomiting episode today.  Denies current nausea or abdominal pain.  States she continues to have numbness type feeling in her right lower extremity but complains of pain in the right leg.  Denies chest pain, shortness of breath, headache or dizziness.  Objective:   Vitals:   04/23/17 0553 04/23/17 0659 04/23/17 0823 04/23/17 1133  BP: (!) 168/99 (!) 166/89 (!) 153/84 (!) 147/77  Pulse: (!) 147 (!) 130  (!) 124  Resp: 19     Temp: 99.1 F (37.3 C)     TempSrc: Oral     SpO2: 97%     Weight: 118.1 kg (260 lb 5.8 oz)     Height: 5' 6.5" (1.689 m)       Intake/Output Summary (Last 24 hours) at 04/23/2017 1202 Last data filed at 04/23/2017 1100 Gross per 24 hour  Intake 1060 ml  Output 25 ml  Net 1035 ml   Filed Weights   04/21/17 0200 04/22/17 0525 04/23/17 0553  Weight: 119 kg (262 lb 5.6 oz) 124.9 kg (275 lb 5.7 oz) 118.1 kg (260 lb 5.8 oz)   Exam  General: Well developed, well nourished, NAD, appears stated age  HEENT: NCAT, mucous membranes moist.   Neck: Supple  Cardiovascular: S1 S2 auscultated, tahcycardic  Respiratory: Clear to auscultation bilaterally with equal chest rise  Abdomen: Soft, nontender, nondistended, + bowel sounds  Extremities: warm dry without cyanosis clubbing. RLE with mild edema, improving   Neuro: AAOx3, nonfocal, supposed numbness in RLE.   Skin: Without rashes exudates or nodules  Psych: Appropriate mood and affect   Data Reviewed: I have personally reviewed following labs and imaging studies  CBC: Recent Labs  Lab  04/17/17 0410 04/18/17 0423 04/18/17 2056 04/20/17 0342 04/22/17 0403  WBC 24.2* 16.6* 21.0* 17.7* 14.0*  HGB 8.3* 6.8* 10.9* 10.4* 10.0*  HCT 25.2* 20.2* 32.7* 31.3* 31.1*  MCV 87.5 89.0 88.4 88.9 90.9  PLT 356 300 301 323 341   Basic Metabolic Panel: Recent Labs  Lab 04/19/17 0359 04/20/17 0342 04/21/17 0412 04/22/17 0403 04/23/17 0400  NA 131* 133* 135 135 134*  K 3.9 4.0 3.4* 4.0 4.0  CL 94*  96* 96* 97* 97*  CO2 24 25 26 25 24   GLUCOSE 123* 119* 135* 95 86  BUN 25* 32* 15 25* 33*  CREATININE 7.35* 8.97* 5.87* 8.07* 9.81*  CALCIUM 8.1* 8.3* 8.0* 8.6* 8.8*  PHOS 3.8 4.6 3.4 5.4* 6.3*   GFR: Estimated Creatinine Clearance: 9.4 mL/min (A) (by C-G formula based on SCr of 9.81 mg/dL (H)). Liver Function Tests: Recent Labs  Lab 04/18/17 0423 04/19/17 0359 04/20/17 0342 04/21/17 0412 04/22/17 0403 04/23/17 0400  AST 43*  --   --   --   --   --   ALT 9*  --   --   --   --   --   ALKPHOS 75  --   --   --   --   --   BILITOT 0.4  --   --   --   --   --   PROT 5.1*  --   --   --   --   --   ALBUMIN 2.0*  2.0* 2.2* 2.3* 2.2* 2.2* 2.2*   No results for input(s): LIPASE, AMYLASE in the last 168 hours. No results for input(s): AMMONIA in the last 168 hours. Coagulation Profile: No results for input(s): INR, PROTIME in the last 168 hours. Cardiac Enzymes: Recent Labs  Lab 04/18/17 0423  CKTOTAL 831*   BNP (last 3 results) No results for input(s): PROBNP in the last 8760 hours. HbA1C: No results for input(s): HGBA1C in the last 72 hours. CBG: No results for input(s): GLUCAP in the last 168 hours. Lipid Profile: No results for input(s): CHOL, HDL, LDLCALC, TRIG, CHOLHDL, LDLDIRECT in the last 72 hours. Thyroid Function Tests: No results for input(s): TSH, T4TOTAL, FREET4, T3FREE, THYROIDAB in the last 72 hours. Anemia Panel: No results for input(s): VITAMINB12, FOLATE, FERRITIN, TIBC, IRON, RETICCTPCT in the last 72 hours. Urine analysis:    Component Value Date/Time   COLORURINE BROWN (A) 04/11/2017 1814   APPEARANCEUR TURBID (A) 04/11/2017 1814   LABSPEC 1.020 04/11/2017 1814   PHURINE 7.0 04/11/2017 1814   GLUCOSEU NEGATIVE 04/11/2017 1814   HGBUR LARGE (A) 04/11/2017 1814   BILIRUBINUR SMALL (A) 04/11/2017 1814   KETONESUR NEGATIVE 04/11/2017 1814   PROTEINUR 100 (A) 04/11/2017 1814   UROBILINOGEN 0.2 08/02/2008 0812   NITRITE POSITIVE (A) 04/11/2017 1814    LEUKOCYTESUR TRACE (A) 04/11/2017 1814   Sepsis Labs: @LABRCNTIP (procalcitonin:4,lacticidven:4)  ) No results found for this or any previous visit (from the past 240 hour(s)).    Radiology Studies: No results found.   Scheduled Meds: . acetaminophen  650 mg Oral TID  . calcium acetate  667 mg Oral TID WC  . Chlorhexidine Gluconate Cloth  6 each Topical Q0600  . cloZAPine  400 mg Oral QHS  . darbepoetin (ARANESP) injection - NON-DIALYSIS  100 mcg Subcutaneous Q Fri-1800  . diclofenac sodium  4 g Topical QID  . doxepin  50 mg Oral QHS  . fentaNYL  25 mcg Transdermal Q48H  . heparin injection (  subcutaneous)  5,000 Units Subcutaneous Q8H  . LORazepam  1 mg Intravenous Once  . metoprolol tartrate  50 mg Oral BID  . pantoprazole  40 mg Oral BID AC  . QUEtiapine  400 mg Oral QHS  . vortioxetine HBr  20 mg Oral QHS   Continuous Infusions: . sodium chloride    . sodium chloride       LOS: 18 days   Time Spent in minutes   30 minutes  Jenna Hill D.O. on 04/23/2017 at 12:02 PM  Between 7am to 7pm - Pager - [phone]  After 7pm go to [email or web address] - password [email or web address]  And look for the night coverage person covering for me after hours  [facility] Office  [phone]

## 2017-04-23 NOTE — Progress Notes (Signed)
Patient has only voided 21ml since 0700. Has purewick in place. Will continue to monitor.

## 2017-04-24 LAB — RENAL FUNCTION PANEL
Albumin: 2.2 g/dL — ABNORMAL LOW (ref 3.5–5.0)
Anion gap: 12 (ref 5–15)
BUN: 42 mg/dL — AB (ref 6–20)
CHLORIDE: 99 mmol/L — AB (ref 101–111)
CO2: 25 mmol/L (ref 22–32)
Calcium: 9.1 mg/dL (ref 8.9–10.3)
Creatinine, Ser: 10.93 mg/dL — ABNORMAL HIGH (ref 0.44–1.00)
GFR calc Af Amer: 4 mL/min — ABNORMAL LOW (ref >60)
GFR calc non Af Amer: 4 mL/min — ABNORMAL LOW (ref >60)
Glucose, Bld: 96 mg/dL (ref 65–99)
Phosphorus: 6.5 mg/dL — ABNORMAL HIGH (ref 2.5–4.6)
Potassium: 4.7 mmol/L (ref 3.5–5.1)
SODIUM: 136 mmol/L (ref 135–145)

## 2017-04-24 LAB — CBC
HCT: 30.9 % — ABNORMAL LOW (ref 36.0–46.0)
Hemoglobin: 9.8 g/dL — ABNORMAL LOW (ref 12.0–15.0)
MCH: 28.9 pg (ref 26.0–34.0)
MCHC: 31.7 g/dL (ref 30.0–36.0)
MCV: 91.2 fL (ref 78.0–100.0)
PLATELETS: 285 10*3/uL (ref 150–400)
RBC: 3.39 MIL/uL — ABNORMAL LOW (ref 3.87–5.11)
RDW: 14.8 % (ref 11.5–15.5)
WBC: 13.1 10*3/uL — ABNORMAL HIGH (ref 4.0–10.5)

## 2017-04-24 MED ORDER — CALCIUM ACETATE (PHOS BINDER) 667 MG PO CAPS
1334.0000 mg | ORAL_CAPSULE | Freq: Three times a day (TID) | ORAL | Status: DC
  Administered 2017-04-24 – 2017-04-29 (×15): 1334 mg via ORAL
  Filled 2017-04-24 (×15): qty 2

## 2017-04-24 MED ORDER — FENTANYL 12 MCG/HR TD PT72
25.0000 ug | MEDICATED_PATCH | TRANSDERMAL | Status: DC
  Administered 2017-04-24 – 2017-04-27 (×2): 25 ug via TRANSDERMAL
  Filled 2017-04-24 (×2): qty 2

## 2017-04-24 NOTE — Progress Notes (Signed)
[facility] ROUNDING NOTE   Subjective:   Interval History:  This is a very pleasant lady with bipolar disorder, schizophrenia, hypertension who presented to the hospital on 04/05/2017 obtunded with hypothermia and tachycardia.  It appears that she has had severe rhabdomyolysis and started dialysis 3/8   We shall watch for recovery  Comfortable night     Objective:  Vital signs in last 24 hours:  Temp:  [98.7 F (37.1 C)-99.4 F (37.4 C)] 98.7 F (37.1 C) (03/24 0404) Pulse Rate:  [108-124] 108 (03/24 0404) Resp:  [18-22] 18 (03/24 0404) BP: (136-147)/(77-87) 136/81 (03/24 0404) SpO2:  [94 %-96 %] 94 % (03/24 0404) Weight:  [260 lb 9.6 oz (118.2 kg)] 260 lb 9.6 oz (118.2 kg) (03/24 0404)  Weight change: 3.8 oz (0.107 kg) Filed Weights   04/22/17 0525 04/23/17 0553 04/24/17 0404  Weight: 275 lb 5.7 oz (124.9 kg) 260 lb 5.8 oz (118.1 kg) 260 lb 9.6 oz (118.2 kg)    Intake/Output: I/O last 3 completed shifts: In: 1660 [P.O.:1660] Out: 625 [Urine:625]   Intake/Output this shift:  Total I/O In: 120 [P.O.:120] Out: -   CVS- RRR RS- CTA ABD- BS present soft non-distended EXT- no edema   Basic Metabolic Panel: Recent Labs  Lab 04/20/17 0342 04/21/17 0412 04/22/17 0403 04/23/17 0400 04/24/17 0404  NA 133* 135 135 134* 136  K 4.0 3.4* 4.0 4.0 4.7  CL 96* 96* 97* 97* 99*  CO2 25 26 25 24 25   GLUCOSE 119* 135* 95 86 96  BUN 32* 15 25* 33* 42*  CREATININE 8.97* 5.87* 8.07* 9.81* 10.93*  CALCIUM 8.3* 8.0* 8.6* 8.8* 9.1  PHOS 4.6 3.4 5.4* 6.3* 6.5*    Liver Function Tests: Recent Labs  Lab 04/18/17 0423  04/20/17 0342 04/21/17 0412 04/22/17 0403 04/23/17 0400 04/24/17 0404  AST 43*  --   --   --   --   --   --   ALT 9*  --   --   --   --   --   --   ALKPHOS 75  --   --   --   --   --   --   BILITOT 0.4  --   --   --   --   --   --   PROT 5.1*  --   --   --   --   --   --   ALBUMIN 2.0*  2.0*   < > 2.3* 2.2* 2.2* 2.2* 2.2*   < > = values  in this interval not displayed.   No results for input(s): LIPASE, AMYLASE in the last 168 hours. No results for input(s): AMMONIA in the last 168 hours.  CBC: Recent Labs  Lab 04/18/17 2056 04/20/17 0342 04/22/17 0403 04/23/17 1741 04/24/17 0404  WBC 21.0* 17.7* 14.0* 12.7* 13.1*  HGB 10.9* 10.4* 10.0* 10.3* 9.8*  HCT 32.7* 31.3* 31.1* 31.4* 30.9*  MCV 88.4 88.9 90.9 91.5 91.2  PLT 301 323 275 300 285    Cardiac Enzymes: Recent Labs  Lab 04/18/17 0423  CKTOTAL 831*    BNP: Invalid input(s): POCBNP  CBG: No results for input(s): GLUCAP in the last 168 hours.  Microbiology: Results for orders placed or performed during the hospital encounter of 04/05/17  Respiratory Panel by PCR     Status: None   Collection Time: 04/05/17  4:01 PM  Result Value Ref Range Status   Adenovirus NOT DETECTED NOT DETECTED Final  Coronavirus 229E NOT DETECTED NOT DETECTED Final   Coronavirus HKU1 NOT DETECTED NOT DETECTED Final   Coronavirus NL63 NOT DETECTED NOT DETECTED Final   Coronavirus OC43 NOT DETECTED NOT DETECTED Final   Metapneumovirus NOT DETECTED NOT DETECTED Final   Rhinovirus / Enterovirus NOT DETECTED NOT DETECTED Final   Influenza A NOT DETECTED NOT DETECTED Final   Influenza B NOT DETECTED NOT DETECTED Final   Parainfluenza Virus 1 NOT DETECTED NOT DETECTED Final   Parainfluenza Virus 2 NOT DETECTED NOT DETECTED Final   Parainfluenza Virus 3 NOT DETECTED NOT DETECTED Final   Parainfluenza Virus 4 NOT DETECTED NOT DETECTED Final   Respiratory Syncytial Virus NOT DETECTED NOT DETECTED Final   Bordetella pertussis NOT DETECTED NOT DETECTED Final   Chlamydophila pneumoniae NOT DETECTED NOT DETECTED Final   Mycoplasma pneumoniae NOT DETECTED NOT DETECTED Final    Comment: Performed at [facility], [place] [address] [place], [place] [address]  Culture, blood (routine x 2)     Status: None   Collection Time: 04/05/17  5:51 PM  Result Value Ref Range Status    Specimen Description   Final    BLOOD RIGHT HAND Performed at [facility] [address] [place], [place] [address]    Special Requests   Final    IN PEDIATRIC BOTTLE Blood Culture adequate volume Performed at [facility] [address], [place], [place] [address]    Culture   Final    NO GROWTH 5 DAYS Performed at [facility], [place] [address] [place], [place] [address]    Report Status 04/10/2017 FINAL  Final  Culture, blood (routine x 2)     Status: None   Collection Time: 04/05/17  5:51 PM  Result Value Ref Range Status   Specimen Description   Final    BLOOD RIGHT HAND Performed at [facility] [address] [place], [place] [address]    Special Requests   Final    IN PEDIATRIC BOTTLE Blood Culture adequate volume Performed at [facility] [address] [place], [place] [address]    Culture   Final    NO GROWTH 5 DAYS Performed at [facility], [place] [address] [place], [place] [address]    Report Status 04/10/2017 FINAL  Final  MRSA PCR Screening     Status: None   Collection Time: 04/05/17  6:13 PM  Result Value Ref Range Status   MRSA by PCR NEGATIVE NEGATIVE Final    Comment:        The GeneXpert MRSA Assay (FDA approved for NASAL specimens only), is one component of a comprehensive MRSA colonization surveillance program. It is not intended to diagnose MRSA infection nor to guide or monitor treatment for MRSA infections. Performed at [facility], [place] [address], [place], [place] [address]   Culture, Urine     Status: Abnormal   Collection Time: 04/11/17 10:49 PM  Result Value Ref Range Status   Specimen Description URINE, CLEAN CATCH  Final   Special Requests NONE  Final   Culture (A)  Final    <10,000 COLONIES/mL INSIGNIFICANT GROWTH Performed at [facility], [place] [address] [place],  [address]    Report Status 04/13/2017  FINAL  Final  Culture, blood (Routine X 2) w Reflex to ID Panel     Status: None   Collection Time: 04/12/17  6:26 PM  Result Value Ref  Range Status   Specimen Description BLOOD RIGHT ARM  Final   Special Requests   Final    BOTTLES DRAWN AEROBIC AND ANAEROBIC Blood Culture adequate volume   Culture   Final    NO GROWTH 5 DAYS Performed at [facility], [address] [place], [place] [address]    Report Status 04/17/2017 FINAL  Final  Culture, blood (Routine X 2) w Reflex to ID Panel     Status: None   Collection Time: 04/12/17  6:40 PM  Result Value Ref Range Status   Specimen Description BLOOD RIGHT HAND  Final   Special Requests IN PEDIATRIC BOTTLE Blood Culture adequate volume  Final   Culture   Final    NO GROWTH 5 DAYS Performed at [facility], [place] [address], [place], [place] [address]    Report Status 04/17/2017 FINAL  Final    Coagulation Studies: No results for input(s): LABPROT, INR in the last 72 hours.  Urinalysis: No results for input(s): COLORURINE, LABSPEC, PHURINE, GLUCOSEU, HGBUR, BILIRUBINUR, KETONESUR, PROTEINUR, UROBILINOGEN, NITRITE, LEUKOCYTESUR in the last 72 hours.  Invalid input(s): APPERANCEUR    Imaging: No results found.   Medications:   . sodium chloride    . sodium chloride     . acetaminophen  650 mg Oral TID  . calcium acetate  667 mg Oral TID WC  . Chlorhexidine Gluconate Cloth  6 each Topical Q0600  . cloZAPine  400 mg Oral QHS  . darbepoetin (ARANESP) injection - NON-DIALYSIS  100 mcg Subcutaneous Q Fri-1800  . diclofenac sodium  4 g Topical QID  . doxepin  50 mg Oral QHS  . fentaNYL  25 mcg Transdermal Q48H  . heparin injection (subcutaneous)  5,000 Units Subcutaneous Q8H  . LORazepam  1 mg Intravenous Once  . metoprolol tartrate  50 mg Oral BID  . pantoprazole  40 mg Oral BID AC  . QUEtiapine  400 mg Oral QHS  . vortioxetine HBr  20 mg Oral QHS   acetaminophen, ondansetron (ZOFRAN) IV,  traMADol  Assessment/ Plan:  1 Acute renal failure- found down, AKI due to ATN from severe rhabdo- SP HD beginnin 3/8.Has Temp cath still.Continues with HD-variable UOP.  Will monitor and no dialysis today 2 Severe depression/ schizoaffective disorder - on meds 3 s/p Resp failure -  5 HTN -  Amlodipine 5 and metoprolol 50 mg       LOS: 19 Detta Mellin W @TODAY @10 :13 AM

## 2017-04-24 NOTE — Progress Notes (Signed)
PROGRESS NOTE    Jenna Hill  OMV:[id number] DOB: 1970-07-22 DOA: 04/05/2017 PCP: Alfonse Spruce, FNP   Brief Narrative:  47 year old with history of bipolar disorder, schizophrenia, hypertension who presented to the hospital on 04/05/2017 obtunded with hypothermia and tachycardia.  Narcan was given which did not help.  Patient was then intubated for airway protection.  Her chest x-ray did show some vascular congestion, patient received 4 days of Unasyn for possible aspiration.  Patient was given aggressive IV hydration for renal injury.  Nephrology was consulted and appreciated, patient was placed on dialysis.  Patient was extubated on 04/08/2017.  She was also placed on Unasyn for presumed cellulitis of the right lower extremity.  She had CT of the hip which showed some edema versus infection, she was evaluated by orthopedics, there was no concern for compartment syndrome.  Patient did develop some abdominal pain, CTs scan showed concern for pancreatitis.  She is noted to have gallstones on ultrasound however no evidence of cholecystitis.  Patient will eventually need GI and surgical outpatient follow-ups.  Continues to have worsening creatinine. Monitoring output. Pending further recommendations from nephrology.   Assessment & Plan   Acute metabolic encephalopathy -Resolved, currently AAOx3 -Possibly related to medication overdose and renal failure -CT head unremarkable -UDS and APAP, salicylate negative  Bipolar/schizoaffective disorder -Psychiatry consulted and appreciated as patient hearing voices.  -Continue clozapine, with gradual increase to 400mg  daily (to avoid myocarditis) -patient does not need inpatient psych admission -continue abilify and doxepin   Tachycardia  -Unknown etiology, possible secondary to anemia, renal failure, pain -TSH checked earlier this admission, WNL  -Echocardiogram EF 6283%, grade 1 diastolic dysfunction -placed on metoprolol by nephrology -HR  continues to be high, but mildly improved -continue to monitor   Acute respiratory failure -Required intubation on admission, was extubated on 04/08/2017 -Respiratory viral panel negative -Respiratory culture shows no growth -CXR x-ray showed left lower lobe consolidation -Treated with Unasyn  Rhabdomyolysis with severe swelling of the right lower extremity/pain -CK improved and trending downward -CT on 04/09/2017 showed moderate subcutaneous edema and fat stranding about the right lateral hip and circumferentially through the thigh, nonspecific, but can be seen with cellulitis.  No drainable fluid collection. -Right lower extremity venous Doppler negative -Patient continues to have pain and has been using IV fentanyl frequently, over 300 mcg in 1 day -Patient has intolerance to tramadol, Percocet, Vicodin and NSAIDs -Orthopedics consulted and appreciated, no concern for compartment syndrome -Continue fentanyl patch with tramadol PRN - not sure if patient's pain is being reported accurately. She state she has numbness all over but feels pain in her leg. Will change fentanyl patch to q72 hr.   Acute kidney injury/ATN -Secondary to the above -Neurology consulted and appreciated -last hemodialysis session on 04/20/2017 -Creatinine currently 10.93 -urine output over past 24hrs 625cc -Continue to monitor BMP  Hyperphosphatemia  -currently on phoslo TID, will increase to 2 tabs TID -continue to monitor   Elevated transaminases/? Pancreatitis -Suspect secondary to shock liver versus rhabdomyolysis -LFTs have improved -Ultrasound with cholelithiasis-suspect patient will need outpatient surgery or GI follow-up -Hepatitis B negative -CT abdomen showed possible pancreatitis.  Lipase was negative.  Patient denied abdominal pain.  Leukocytosis -WBC improving, currently  13.1 -CT hip with edema, possible cellulitis -Completed Ancef course -Blood Cultures show no growth to date -Urine culture  shows insignificant growth -Upon review of patient's chart, patient has had leukocytosis for the past several years, she will need hematology outpatient follow-up -continue to monitor  CBC  Hypertriglyceridemia -will need lopid once rhabdo has resolved  Chronic anemia -Hemoglobin dropped to 6, patient received 2 units PRBC on 04/18/2017 -Baseline hemoglobin approximately 9-10 -hemoglobin 9.8 -Continue aranesp  Essential hypertension -amlodipine discontinued today, placed on metoprolol given tachycardia  Physical deconditioning -PT recommended inpatient rehab -CIR consulted- pending decision and recommendations regarding dialysis   Vomiting -denies nausea, occurs in the morning only -has not had reoccurrence this morning  -continue zofran PRN and PPI  DVT Prophylaxis  heparin  Code Status: Full  Family Communication: none at bedside  Disposition Plan: Admitted. Pending CIR.  Consultants PCCM Nephrology Psychiatry  Orthopedics Inpatient rehab  Procedures  Intubation/extubation Insertion of HD catheter  Antibiotics   Anti-infectives (From admission, onward)   Start     Dose/Rate Route Frequency Ordered Stop   04/13/17 2000  ceFAZolin (ANCEF) IVPB 1 g/50 mL premix     1 g 100 mL/hr over 30 Minutes Intravenous Every 24 hours 04/13/17 1401 04/19/17 2053   04/09/17 2000  Ampicillin-Sulbactam (UNASYN) 3 g in sodium chloride 0.9 % 100 mL IVPB  Status:  Discontinued     3 g 200 mL/hr over 30 Minutes Intravenous Every 24 hours 04/09/17 1119 04/11/17 1134   04/08/17 0600  Ampicillin-Sulbactam (UNASYN) 3 g in sodium chloride 0.9 % 100 mL IVPB  Status:  Discontinued     3 g 200 mL/hr over 30 Minutes Intravenous Every 24 hours 04/07/17 0719 04/09/17 1119   04/06/17 1800  Ampicillin-Sulbactam (UNASYN) 3 g in sodium chloride 0.9 % 100 mL IVPB  Status:  Discontinued     3 g 200 mL/hr over 30 Minutes Intravenous Every 12 hours 04/06/17 0942 04/07/17 0719   04/05/17 1800   Ampicillin-Sulbactam (UNASYN) 3 g in sodium chloride 0.9 % 100 mL IVPB  Status:  Discontinued     3 g 200 mL/hr over 30 Minutes Intravenous Every 6 hours 04/05/17 1620 04/06/17 0942   04/05/17 1630  Ampicillin-Sulbactam (UNASYN) 3 g in sodium chloride 0.9 % 100 mL IVPB     3 g 200 mL/hr over 30 Minutes Intravenous  Once 04/05/17 1608 04/05/17 1844      Subjective:   Jenna Hill seen and examined today.  No further vomiting. States she continues to have pain in her leg, but it feels numb. Also states her whole body feels numb, even her heart. Denies chest pain, shortness of breath, abdominal pain.  Objective:   Vitals:   04/23/17 1133 04/23/17 1328 04/23/17 2122 04/24/17 0404  BP: (!) 147/77 (!) 142/87 (!) 143/81 136/81  Pulse: (!) 124 (!) 114 (!) 114 (!) 108  Resp:  20 (!) 22 18  Temp:  99.4 F (37.4 C) 99.3 F (37.4 C) 98.7 F (37.1 C)  TempSrc:  Oral Oral Oral  SpO2:  96% 95% 94%  Weight:    118.2 kg (260 lb 9.6 oz)  Height:        Intake/Output Summary (Last 24 hours) at 04/24/2017 1138 Last data filed at 04/24/2017 1004 Gross per 24 hour  Intake 720 ml  Output 600 ml  Net 120 ml   Filed Weights   04/22/17 0525 04/23/17 0553 04/24/17 0404  Weight: 124.9 kg (275 lb 5.7 oz) 118.1 kg (260 lb 5.8 oz) 118.2 kg (260 lb 9.6 oz)   Exam  General: Well developed, well nourished, NAD, appears stated age  HEENT: NCAT, mucous membranes moist.   Neck: Supple  Cardiovascular: S1 S2 auscultated, tachycardic  Respiratory: Clear to auscultation bilaterally with  equal chest rise  Abdomen: Soft, obese, nontender, nondistended, + bowel sounds  Extremities: warm dry without cyanosis clubbing. RLE edema (improving)  Neuro: AAOx3, nonfocal  Psych: Appropriate  Data Reviewed: I have personally reviewed following labs and imaging studies  CBC: Recent Labs  Lab 04/18/17 2056 04/20/17 0342 04/22/17 0403 04/23/17 1741 04/24/17 0404  WBC 21.0* 17.7* 14.0* 12.7* 13.1*    HGB 10.9* 10.4* 10.0* 10.3* 9.8*  HCT 32.7* 31.3* 31.1* 31.4* 30.9*  MCV 88.4 88.9 90.9 91.5 91.2  PLT 301 323 275 300 073   Basic Metabolic Panel: Recent Labs  Lab 04/20/17 0342 04/21/17 0412 04/22/17 0403 04/23/17 0400 04/24/17 0404  NA 133* 135 135 134* 136  K 4.0 3.4* 4.0 4.0 4.7  CL 96* 96* 97* 97* 99*  CO2 25 26 25 24 25   GLUCOSE 119* 135* 95 86 96  BUN 32* 15 25* 33* 42*  CREATININE 8.97* 5.87* 8.07* 9.81* 10.93*  CALCIUM 8.3* 8.0* 8.6* 8.8* 9.1  PHOS 4.6 3.4 5.4* 6.3* 6.5*   GFR: Estimated Creatinine Clearance: 8.5 mL/min (A) (by C-G formula based on SCr of 10.93 mg/dL (H)). Liver Function Tests: Recent Labs  Lab 04/18/17 0423  04/20/17 0342 04/21/17 0412 04/22/17 0403 04/23/17 0400 04/24/17 0404  AST 43*  --   --   --   --   --   --   ALT 9*  --   --   --   --   --   --   ALKPHOS 75  --   --   --   --   --   --   BILITOT 0.4  --   --   --   --   --   --   PROT 5.1*  --   --   --   --   --   --   ALBUMIN 2.0*  2.0*   < > 2.3* 2.2* 2.2* 2.2* 2.2*   < > = values in this interval not displayed.   No results for input(s): LIPASE, AMYLASE in the last 168 hours. No results for input(s): AMMONIA in the last 168 hours. Coagulation Profile: No results for input(s): INR, PROTIME in the last 168 hours. Cardiac Enzymes: Recent Labs  Lab 04/18/17 0423  CKTOTAL 831*   BNP (last 3 results) No results for input(s): PROBNP in the last 8760 hours. HbA1C: No results for input(s): HGBA1C in the last 72 hours. CBG: No results for input(s): GLUCAP in the last 168 hours. Lipid Profile: No results for input(s): CHOL, HDL, LDLCALC, TRIG, CHOLHDL, LDLDIRECT in the last 72 hours. Thyroid Function Tests: No results for input(s): TSH, T4TOTAL, FREET4, T3FREE, THYROIDAB in the last 72 hours. Anemia Panel: No results for input(s): VITAMINB12, FOLATE, FERRITIN, TIBC, IRON, RETICCTPCT in the last 72 hours. Urine analysis:    Component Value Date/Time   COLORURINE BROWN  (A) 04/11/2017 1814   APPEARANCEUR TURBID (A) 04/11/2017 1814   LABSPEC 1.020 04/11/2017 1814   PHURINE 7.0 04/11/2017 1814   GLUCOSEU NEGATIVE 04/11/2017 1814   HGBUR LARGE (A) 04/11/2017 1814   BILIRUBINUR SMALL (A) 04/11/2017 1814   KETONESUR NEGATIVE 04/11/2017 1814   PROTEINUR 100 (A) 04/11/2017 1814   UROBILINOGEN 0.2 08/02/2008 0812   NITRITE POSITIVE (A) 04/11/2017 1814   LEUKOCYTESUR TRACE (A) 04/11/2017 1814   Sepsis Labs: @LABRCNTIP (procalcitonin:4,lacticidven:4)  ) No results found for this or any previous visit (from the past 240 hour(s)).    Radiology Studies: No results found.  Scheduled Meds: . acetaminophen  650 mg Oral TID  . calcium acetate  667 mg Oral TID WC  . Chlorhexidine Gluconate Cloth  6 each Topical Q0600  . cloZAPine  400 mg Oral QHS  . darbepoetin (ARANESP) injection - NON-DIALYSIS  100 mcg Subcutaneous Q Fri-1800  . diclofenac sodium  4 g Topical QID  . doxepin  50 mg Oral QHS  . fentaNYL  25 mcg Transdermal Q48H  . heparin injection (subcutaneous)  5,000 Units Subcutaneous Q8H  . LORazepam  1 mg Intravenous Once  . metoprolol tartrate  50 mg Oral BID  . pantoprazole  40 mg Oral BID AC  . QUEtiapine  400 mg Oral QHS  . vortioxetine HBr  20 mg Oral QHS   Continuous Infusions: . sodium chloride    . sodium chloride       LOS: 19 days   Time Spent in minutes   30 minutes  Lasonia Casino D.O. on 04/24/2017 at 11:38 AM  Between 7am to 7pm - Pager - [phone]  After 7pm go to [email or web address] - password [email or web address]  And look for the night coverage person covering for me after hours  [facility] Office  [phone]

## 2017-04-25 DIAGNOSIS — N179 Acute kidney failure, unspecified: Secondary | ICD-10-CM

## 2017-04-25 LAB — RENAL FUNCTION PANEL
Albumin: 2.2 g/dL — ABNORMAL LOW (ref 3.5–5.0)
Anion gap: 16 — ABNORMAL HIGH (ref 5–15)
BUN: 50 mg/dL — ABNORMAL HIGH (ref 6–20)
CALCIUM: 9.4 mg/dL (ref 8.9–10.3)
CO2: 23 mmol/L (ref 22–32)
CREATININE: 11.44 mg/dL — AB (ref 0.44–1.00)
Chloride: 96 mmol/L — ABNORMAL LOW (ref 101–111)
GFR, EST AFRICAN AMERICAN: 4 mL/min — AB (ref >60)
GFR, EST NON AFRICAN AMERICAN: 3 mL/min — AB (ref >60)
Glucose, Bld: 93 mg/dL (ref 65–99)
Phosphorus: 7.1 mg/dL — ABNORMAL HIGH (ref 2.5–4.6)
Potassium: 4.4 mmol/L (ref 3.5–5.1)
SODIUM: 135 mmol/L (ref 135–145)

## 2017-04-25 LAB — CBC
HCT: 30.9 % — ABNORMAL LOW (ref 36.0–46.0)
Hemoglobin: 9.9 g/dL — ABNORMAL LOW (ref 12.0–15.0)
MCH: 29.1 pg (ref 26.0–34.0)
MCHC: 32 g/dL (ref 30.0–36.0)
MCV: 90.9 fL (ref 78.0–100.0)
PLATELETS: 290 10*3/uL (ref 150–400)
RBC: 3.4 MIL/uL — ABNORMAL LOW (ref 3.87–5.11)
RDW: 14.8 % (ref 11.5–15.5)
WBC: 11.9 10*3/uL — ABNORMAL HIGH (ref 4.0–10.5)

## 2017-04-25 MED ORDER — BISACODYL 5 MG PO TBEC
10.0000 mg | DELAYED_RELEASE_TABLET | Freq: Once | ORAL | Status: AC
  Administered 2017-04-25: 10 mg via ORAL
  Filled 2017-04-25: qty 2

## 2017-04-25 NOTE — Progress Notes (Signed)
Occupational Therapy Treatment Patient Details Name: Jenna Hill MRN: [id number] DOB: 10-21-1970 Today's Date: 04/25/2017    History of present illness pt is a 47 y/o female with pmh significant for schizophrenia, HTN, bipolar d/o and attempted suicide admitted with AMS and hypothermia, suspected intentional OD of psych medication.  Treating for aspiration and sepsis.  Extubated 3/8. Received HD on same day. has been on HD, there some hope for renal function recovery. She has been having leukocytosis of unclear etiology. She has been receiving Unasyn to cover for presume cellulitis of right lower extremity. She had CT hip that showed edema vs infection. She was evaluated by ortho and there was not concern for compartment syndrome.   OT comments  Pt continues to benefit from OT intervention. Pt having just returned to bed at OT arrival. Pt declined OOB tasks or sitting EOB but was agreeable to therapeutic exercise. OT demonstrated use of level 2 resistive theraband and pt performed 3 sets of 15 reps for alternating punches, chest press, bicep curls, shoulder elevation, and shoulder diagonals. Pt required min verbal cues for proper technique and rest breaks secondary to fatigue. Pt asking several questions in regard to expectation of recommended CIR stay. Pt remained in bed as therapist exited the room. Bed alarm activated.  Follow Up Recommendations  CIR    Equipment Recommendations  Other (comment)(defer to next venue of care)    Recommendations for Other Services      Precautions / Restrictions Precautions Precautions: Fall Precaution Comments: No pf/df at R Foot. Required Braces or Orthoses: Other Brace/Splint Restrictions Weight Bearing Restrictions: No       Mobility Bed Mobility               General bed mobility comments: in bed and declined sitting EOB         ADL either performed or assessed with clinical judgement        Vision Baseline Vision/History:  Wears glasses Wears Glasses: Reading only Patient Visual Report: No change from baseline            Cognition Arousal/Alertness: Awake/alert Behavior During Therapy: WFL for tasks assessed/performed Overall Cognitive Status: Within Functional Limits for tasks assessed                   Pertinent Vitals/ Pain       Pain Assessment: No/denies pain         Frequency  Min 2X/week        Progress Toward Goals  OT Goals(current goals can now be found in the care plan section)  Progress towards OT goals: Progressing toward goals  Acute Rehab OT Goals Patient Stated Goal: to get stronger OT Goal Formulation: With patient Time For Goal Achievement: 05/10/18 Potential to Achieve Goals: Good  Plan Discharge plan remains appropriate    AM-PAC PT "6 Clicks" Daily Activity     Outcome Measure   Help from another person eating meals?: None Help from another person taking care of personal grooming?: A Little Help from another person toileting, which includes using toliet, bedpan, or urinal?: A Lot Help from another person bathing (including washing, rinsing, drying)?: A Lot Help from another person to put on and taking off regular upper body clothing?: A Little Help from another person to put on and taking off regular lower body clothing?: A Lot 6 Click Score: 16    End of Session    OT Visit Diagnosis: Muscle weakness (generalized) (M62.81);Pain Pain - Right/Left: Right Pain -  part of body: Leg;Ankle and joints of foot   Activity Tolerance Patient tolerated treatment well   Patient Left in bed;with call bell/phone within reach;with family/visitor present           Time: 1132-1150 OT Time Calculation (min): 18 min  Charges: OT General Charges $OT Visit: 1 Visit OT Treatments $Therapeutic Exercise: 8-22 mins     Carleigh Buccieri P, MS, OTR/L, CBIS 04/25/2017, 12:25 PM

## 2017-04-25 NOTE — Progress Notes (Signed)
[facility] ROUNDING NOTE   Subjective:   Interval History:  This is a very pleasant lady with bipolar disorder, schizophrenia, hypertension who presented to the hospital on 04/05/2017 obtunded with hypothermia and tachycardia.  It appears that she has had severe rhabdomyolysis and started dialysis 3/8   We shall watch for recovery  Comfortable night  Good urine output  Last dialysis 3/21  Creatinine now increased to 11.44  No indications for dialysis at this point   Objective:  Vital signs in last 24 hours:  Temp:  [98 F (36.7 C)-99 F (37.2 C)] 99 F (37.2 C) (03/25 0425) Pulse Rate:  [101-111] 111 (03/25 0821) Resp:  [18-20] 18 (03/25 0425) BP: (145-162)/(80-101) 152/89 (03/25 0821) SpO2:  [94 %-99 %] 94 % (03/25 0425) Weight:  [260 lb 3.2 oz (118 kg)] 260 lb 3.2 oz (118 kg) (03/25 0425)  Weight change: -6.4 oz (-0.181 kg) Filed Weights   04/23/17 0553 04/24/17 0404 04/25/17 0425  Weight: 260 lb 5.8 oz (118.1 kg) 260 lb 9.6 oz (118.2 kg) 260 lb 3.2 oz (118 kg)    Intake/Output: I/O last 3 completed shifts: In: 423 [P.O.:785] Out: 975 [Urine:875; Emesis/NG output:100]   Intake/Output this shift:  No intake/output data recorded.  CVS- RRR RS- CTA ABD- BS present soft non-distended EXT- no edema   Basic Metabolic Panel: Recent Labs  Lab 04/21/17 0412 04/22/17 0403 04/23/17 0400 04/24/17 0404 04/25/17 0402  NA 135 135 134* 136 135  K 3.4* 4.0 4.0 4.7 4.4  CL 96* 97* 97* 99* 96*  CO2 26 25 24 25 23   GLUCOSE 135* 95 86 96 93  BUN 15 25* 33* 42* 50*  CREATININE 5.87* 8.07* 9.81* 10.93* 11.44*  CALCIUM 8.0* 8.6* 8.8* 9.1 9.4  PHOS 3.4 5.4* 6.3* 6.5* 7.1*    Liver Function Tests: Recent Labs  Lab 04/21/17 0412 04/22/17 0403 04/23/17 0400 04/24/17 0404 04/25/17 0402  ALBUMIN 2.2* 2.2* 2.2* 2.2* 2.2*   No results for input(s): LIPASE, AMYLASE in the last 168 hours. No results for input(s): AMMONIA in the last 168 hours.  CBC: Recent  Labs  Lab 04/20/17 0342 04/22/17 0403 04/23/17 1741 04/24/17 0404 04/25/17 0402  WBC 17.7* 14.0* 12.7* 13.1* 11.9*  HGB 10.4* 10.0* 10.3* 9.8* 9.9*  HCT 31.3* 31.1* 31.4* 30.9* 30.9*  MCV 88.9 90.9 91.5 91.2 90.9  PLT 323 275 300 285 290    Cardiac Enzymes: No results for input(s): CKTOTAL, CKMB, CKMBINDEX, TROPONINI in the last 168 hours.  BNP: Invalid input(s): POCBNP  CBG: No results for input(s): GLUCAP in the last 168 hours.  Microbiology: Results for orders placed or performed during the hospital encounter of 04/05/17  Respiratory Panel by PCR     Status: None   Collection Time: 04/05/17  4:01 PM  Result Value Ref Range Status   Adenovirus NOT DETECTED NOT DETECTED Final   Coronavirus 229E NOT DETECTED NOT DETECTED Final   Coronavirus HKU1 NOT DETECTED NOT DETECTED Final   Coronavirus NL63 NOT DETECTED NOT DETECTED Final   Coronavirus OC43 NOT DETECTED NOT DETECTED Final   Metapneumovirus NOT DETECTED NOT DETECTED Final   Rhinovirus / Enterovirus NOT DETECTED NOT DETECTED Final   Influenza A NOT DETECTED NOT DETECTED Final   Influenza B NOT DETECTED NOT DETECTED Final   Parainfluenza Virus 1 NOT DETECTED NOT DETECTED Final   Parainfluenza Virus 2 NOT DETECTED NOT DETECTED Final   Parainfluenza Virus 3 NOT DETECTED NOT DETECTED Final   Parainfluenza Virus 4  NOT DETECTED NOT DETECTED Final   Respiratory Syncytial Virus NOT DETECTED NOT DETECTED Final   Bordetella pertussis NOT DETECTED NOT DETECTED Final   Chlamydophila pneumoniae NOT DETECTED NOT DETECTED Final   Mycoplasma pneumoniae NOT DETECTED NOT DETECTED Final    Comment: Performed at [facility], [place] [address], [place], [place] [address]  Culture, blood (routine x 2)     Status: None   Collection Time: 04/05/17  5:51 PM  Result Value Ref Range Status   Specimen Description   Final    BLOOD RIGHT HAND Performed at [facility] [address] [place], [place] [address]     Special Requests   Final    IN PEDIATRIC BOTTLE Blood Culture adequate volume Performed at [facility] [address] [place], [place] [address]    Culture   Final    NO GROWTH 5 DAYS Performed at [facility], [place] [address] [place], [place] [address]    Report Status 04/10/2017 FINAL  Final  Culture, blood (routine x 2)     Status: None   Collection Time: 04/05/17  5:51 PM  Result Value Ref Range Status   Specimen Description   Final    BLOOD RIGHT HAND Performed at [facility] [address] [place], [place] [address]    Special Requests   Final    IN PEDIATRIC BOTTLE Blood Culture adequate volume Performed at [facility] [address] [place], [place] [address]    Culture   Final    NO GROWTH 5 DAYS Performed at [facility], [place] [address], [place], [place] [address]    Report Status 04/10/2017 FINAL  Final  MRSA PCR Screening     Status: None   Collection Time: 04/05/17  6:13 PM  Result Value Ref Range Status   MRSA by PCR NEGATIVE NEGATIVE Final    Comment:        The GeneXpert MRSA Assay (FDA approved for NASAL specimens only), is one component of a comprehensive MRSA colonization surveillance program. It is not intended to diagnose MRSA infection nor to guide or monitor treatment for MRSA infections. Performed at [facility], [place] [address], [place], [place] [address]   Culture, Urine     Status: Abnormal   Collection Time: 04/11/17 10:49 PM  Result Value Ref Range Status   Specimen Description URINE, CLEAN CATCH  Final   Special Requests NONE  Final   Culture (A)  Final    <10,000 COLONIES/mL INSIGNIFICANT GROWTH Performed at [facility], [place] [address], [place],  [address]    Report Status 04/13/2017 FINAL  Final  Culture, blood (Routine X 2) w Reflex to ID Panel     Status: None   Collection Time: 04/12/17  6:26 PM  Result Value Ref Range  Status   Specimen Description BLOOD RIGHT ARM  Final   Special Requests   Final    BOTTLES DRAWN AEROBIC AND ANAEROBIC Blood Culture adequate volume   Culture   Final    NO GROWTH 5 DAYS Performed at [facility], [place] [address], [place],  [address]    Report Status 04/17/2017 FINAL  Final  Culture, blood (Routine X 2) w Reflex to ID Panel     Status: None   Collection Time: 04/12/17  6:40 PM  Result Value Ref Range Status   Specimen Description BLOOD RIGHT HAND  Final  Special Requests IN PEDIATRIC BOTTLE Blood Culture adequate volume  Final   Culture   Final    NO GROWTH 5 DAYS Performed at [facility], [place] [address], [place], [place] [address]    Report Status 04/17/2017 FINAL  Final    Coagulation Studies: No results for input(s): LABPROT, INR in the last 72 hours.  Urinalysis: No results for input(s): COLORURINE, LABSPEC, PHURINE, GLUCOSEU, HGBUR, BILIRUBINUR, KETONESUR, PROTEINUR, UROBILINOGEN, NITRITE, LEUKOCYTESUR in the last 72 hours.  Invalid input(s): APPERANCEUR    Imaging: No results found.   Medications:   . sodium chloride    . sodium chloride     . acetaminophen  650 mg Oral TID  . calcium acetate  1,334 mg Oral TID WC  . Chlorhexidine Gluconate Cloth  6 each Topical Q0600  . cloZAPine  400 mg Oral QHS  . darbepoetin (ARANESP) injection - NON-DIALYSIS  100 mcg Subcutaneous Q Fri-1800  . diclofenac sodium  4 g Topical QID  . doxepin  50 mg Oral QHS  . fentaNYL  25 mcg Transdermal Q72H  . heparin injection (subcutaneous)  5,000 Units Subcutaneous Q8H  . LORazepam  1 mg Intravenous Once  . metoprolol tartrate  50 mg Oral BID  . pantoprazole  40 mg Oral BID AC  . QUEtiapine  400 mg Oral QHS  . vortioxetine HBr  20 mg Oral QHS   acetaminophen, ondansetron (ZOFRAN) IV, traMADol  Assessment/ Plan:  1 Acute renal failure- found down, AKI due to ATN from severe rhabdo- SP HD beginnin 3/8.Has Temp cath still.Continues with  HD-variable UOP.  Will monitor and no dialysis today 2 Severe depression/ schizoaffective disorder - on meds 3 s/p Resp failure -  5 HTN -  Amlodipine 5 and metoprolol 50 mg       LOS: 20 Imoni Kohen W @TODAY @11 :16 AM

## 2017-04-25 NOTE — Progress Notes (Addendum)
PROGRESS NOTE    Jenna Hill   AOZ:[id number]  DOB: 1970-07-13  DOA: 04/05/2017 PCP: Alfonse Spruce, FNP   Brief Narrative:  Jenna Hill 47 year old with history of bipolar disorder, schizophrenia, hypertension who presented to the hospital on 04/05/2017 obtunded with hypothermia and tachycardia.  Narcan was given which did not help.   I saw her from 3/10 through 3/12 when she was in the SDU.   She stated that she was started on Abilify 15 mg and took the first dose the night before coming to the ER. Her roommate later found her unresponsive. She had not been sick with any other illness and no other changes in medications were made.  Patient was then intubated for airway protection & vomited during intubation.  She received 4 days of Unasyn for possible aspiration    Patient was given aggressive IV hydration for renal injury but Cr continued to worsen and was suspected to be ATN in relation to severe rhabdomyolysis.  Nephrology was consulted. On 3/8 she started on dialysis and also was able to be extubated.       She also had severe right leg swelling which I felt was the cause of her rhabdomyolysis on admission and suspected she had been laying on the leg when unresponsive. She had CT of the hip prior to my eval which showed some edema versus infection   Patient did develop some abdominal pain, CTs scan showed concern for pancreatitis.  She is noted to have gallstones on ultrasound however no evidence of cholecystitis.  Patient will eventually need GI and surgical outpatient follow-ups.   Subjective: She states she urinated about 200 cc today. ROS: no complaints of nausea, vomiting, constipation diarrhea, cough, dyspnea  . No other complaints.   Assessment & Plan:   Principal Problem:   AKI (acute kidney injury)  - suspected to be ATN from Rhabdo- as mentioned, dialysis started on 3/8 -  initially oliguric  - last dialyzed on 3/20- nephrology is following for further  dialysis needs - Cr going up- per I and O in Epic, had 625 cc of urine yesterday  Active Problems:  Tachycarida/ HTN - HCTZ and Amlodipine used at home are on hold - she has remained tachycardic since admission- Metoprolol started by nephrology on 3/23 - TSH was normal - ECHO > EF 50-55 % and grade 1 dCHF    Schizoaffective disorder  - recommendations by psych on 3/15: "Continue home psychotropic medications as prescribed for schizoaffective disorder-  cont Clozaril until she reaches home dose" - Clozaril is at home dose and Trintellix, Seroquel and Doxepin which she used at home are being continued at home dose     Acute metabolic encephalopathy   Acute respiratory failure     Lactic acidosis/ hypothermia - occurring early on in the admission and all have resolved  - CT head negative, APAP and Salicylates negative- UDS negative - encephalopathy resolved- initial thought was she may have overdosed but later told us that she did not- cause still uncertain - she may have aspirated while unresponsive and was treated with Unasyn by ICU team    Right leg swelling - Rhabdomyolysis/ Elevated LFTs - as mentioned above, suspect she was laying on right leg with caused her rhabdo/ swelling  - Elevated LFTS may also been from rhabdo vs shock liver  -abdominal Ultrasound > cholelithiasis-  -Hepatitis B negative -CT abdomen showed possible pancreatitis.  Lipase was negative.  Patient denied abdominal pain. - leg remains mildly swollen-  she currently has a Fentanyl patch, Tylenol TID and PRN Ultram ordered  Leukocytosis - steadily improving- stress response?  AOCD - currently stable Hb- follow  Hyperphosphatemia  -currently on phoslo TID   Deconditioned - CIR consulted- they are following her progress- with worsening Cr, she is not ready for d/c yet  DVT prophylaxis: heparin Code Status: Full code Family Communication: case being discussed with patient Disposition Plan: not yet ready  for d/c Consultants:   Nephrology  Psych  PCCM  -Ortho Procedures:   Intubation/ Extubation  Dialysis cath  04/10/17- - Bilateral LE venous duplex- no DVT- technically limited exam  2D ECHO - 04/06/17 -   mild LVH. Systolic function was normal- 55% to 60%.   Wall motion was normal; there were no regional wall motion   abnormalities- grade 1 diastolic dysfunction I   Antimicrobials:  Anti-infectives (From admission, onward)   Start     Dose/Rate Route Frequency Ordered Stop   04/13/17 2000  ceFAZolin (ANCEF) IVPB 1 g/50 mL premix     1 g 100 mL/hr over 30 Minutes Intravenous Every 24 hours 04/13/17 1401 04/19/17 2053   04/09/17 2000  Ampicillin-Sulbactam (UNASYN) 3 g in sodium chloride 0.9 % 100 mL IVPB  Status:  Discontinued     3 g 200 mL/hr over 30 Minutes Intravenous Every 24 hours 04/09/17 1119 04/11/17 1134   04/08/17 0600  Ampicillin-Sulbactam (UNASYN) 3 g in sodium chloride 0.9 % 100 mL IVPB  Status:  Discontinued     3 g 200 mL/hr over 30 Minutes Intravenous Every 24 hours 04/07/17 0719 04/09/17 1119   04/06/17 1800  Ampicillin-Sulbactam (UNASYN) 3 g in sodium chloride 0.9 % 100 mL IVPB  Status:  Discontinued     3 g 200 mL/hr over 30 Minutes Intravenous Every 12 hours 04/06/17 0942 04/07/17 0719   04/05/17 1800  Ampicillin-Sulbactam (UNASYN) 3 g in sodium chloride 0.9 % 100 mL IVPB  Status:  Discontinued     3 g 200 mL/hr over 30 Minutes Intravenous Every 6 hours 04/05/17 1620 04/06/17 0942   04/05/17 1630  Ampicillin-Sulbactam (UNASYN) 3 g in sodium chloride 0.9 % 100 mL IVPB     3 g 200 mL/hr over 30 Minutes Intravenous  Once 04/05/17 1608 04/05/17 1844       Objective: Vitals:   04/24/17 2209 04/24/17 2235 04/25/17 0425 04/25/17 0821  BP: (!) 161/93 (!) 150/80 (!) 145/88 (!) 152/89  Pulse: (!) 110  (!) 105 (!) 111  Resp: 20  18   Temp: 98 F (36.7 C)  99 F (37.2 C)   TempSrc: Oral  Oral   SpO2: 97%  94%   Weight:   118 kg (260 lb 3.2 oz)     Height:        Intake/Output Summary (Last 24 hours) at 04/25/2017 1337 Last data filed at 04/24/2017 2245 Gross per 24 hour  Intake 445 ml  Output 725 ml  Net -280 ml   Filed Weights   04/23/17 0553 04/24/17 0404 04/25/17 0425  Weight: 118.1 kg (260 lb 5.8 oz) 118.2 kg (260 lb 9.6 oz) 118 kg (260 lb 3.2 oz)    Examination: General exam: Appears comfortable  HEENT: PERRLA, oral mucosa moist, no sclera icterus or thrush Respiratory system: Clear to auscultation. Respiratory effort normal. Cardiovascular system: S1 & S2 heard, RRR.  Mild tachycardia Gastrointestinal system: Abdomen soft, non-tender, nondistended. Normal bowel sound. No organomegaly Central nervous system: Alert and oriented. No focal neurological deficits. Extremities:  No cyanosis, clubbing - right leg swelling noted  Skin: No rashes or ulcers Psychiatry:  Mood & affect appropriate.   Data Reviewed: I have personally reviewed following labs and imaging studies  CBC: Recent Labs  Lab 04/20/17 0342 04/22/17 0403 04/23/17 1741 04/24/17 0404 04/25/17 0402  WBC 17.7* 14.0* 12.7* 13.1* 11.9*  HGB 10.4* 10.0* 10.3* 9.8* 9.9*  HCT 31.3* 31.1* 31.4* 30.9* 30.9*  MCV 88.9 90.9 91.5 91.2 90.9  PLT 323 275 300 285 272   Basic Metabolic Panel: Recent Labs  Lab 04/21/17 0412 04/22/17 0403 04/23/17 0400 04/24/17 0404 04/25/17 0402  NA 135 135 134* 136 135  K 3.4* 4.0 4.0 4.7 4.4  CL 96* 97* 97* 99* 96*  CO2 26 25 24 25 23   GLUCOSE 135* 95 86 96 93  BUN 15 25* 33* 42* 50*  CREATININE 5.87* 8.07* 9.81* 10.93* 11.44*  CALCIUM 8.0* 8.6* 8.8* 9.1 9.4  PHOS 3.4 5.4* 6.3* 6.5* 7.1*   GFR: Estimated Creatinine Clearance: 8.1 mL/min (A) (by C-G formula based on SCr of 11.44 mg/dL (H)). Liver Function Tests: Recent Labs  Lab 04/21/17 0412 04/22/17 0403 04/23/17 0400 04/24/17 0404 04/25/17 0402  ALBUMIN 2.2* 2.2* 2.2* 2.2* 2.2*   No results for input(s): LIPASE, AMYLASE in the last 168 hours. No results  for input(s): AMMONIA in the last 168 hours. Coagulation Profile: No results for input(s): INR, PROTIME in the last 168 hours. Cardiac Enzymes: No results for input(s): CKTOTAL, CKMB, CKMBINDEX, TROPONINI in the last 168 hours. BNP (last 3 results) No results for input(s): PROBNP in the last 8760 hours. HbA1C: No results for input(s): HGBA1C in the last 72 hours. CBG: No results for input(s): GLUCAP in the last 168 hours. Lipid Profile: No results for input(s): CHOL, HDL, LDLCALC, TRIG, CHOLHDL, LDLDIRECT in the last 72 hours. Thyroid Function Tests: No results for input(s): TSH, T4TOTAL, FREET4, T3FREE, THYROIDAB in the last 72 hours. Anemia Panel: No results for input(s): VITAMINB12, FOLATE, FERRITIN, TIBC, IRON, RETICCTPCT in the last 72 hours. Urine analysis:    Component Value Date/Time   COLORURINE BROWN (A) 04/11/2017 1814   APPEARANCEUR TURBID (A) 04/11/2017 1814   LABSPEC 1.020 04/11/2017 1814   PHURINE 7.0 04/11/2017 1814   GLUCOSEU NEGATIVE 04/11/2017 1814   HGBUR LARGE (A) 04/11/2017 1814   BILIRUBINUR SMALL (A) 04/11/2017 1814   KETONESUR NEGATIVE 04/11/2017 1814   PROTEINUR 100 (A) 04/11/2017 1814   UROBILINOGEN 0.2 08/02/2008 0812   NITRITE POSITIVE (A) 04/11/2017 1814   LEUKOCYTESUR TRACE (A) 04/11/2017 1814   Sepsis Labs: @LABRCNTIP (procalcitonin:4,lacticidven:4) )No results found for this or any previous visit (from the past 240 hour(s)).   Radiology Studies: No results found.  Scheduled Meds: . acetaminophen  650 mg Oral TID  . calcium acetate  1,334 mg Oral TID WC  . Chlorhexidine Gluconate Cloth  6 each Topical Q0600  . cloZAPine  400 mg Oral QHS  . darbepoetin (ARANESP) injection - NON-DIALYSIS  100 mcg Subcutaneous Q Fri-1800  . diclofenac sodium  4 g Topical QID  . doxepin  50 mg Oral QHS  . fentaNYL  25 mcg Transdermal Q72H  . heparin injection (subcutaneous)  5,000 Units Subcutaneous Q8H  . LORazepam  1 mg Intravenous Once  . metoprolol  tartrate  50 mg Oral BID  . pantoprazole  40 mg Oral BID AC  . QUEtiapine  400 mg Oral QHS  . vortioxetine HBr  20 mg Oral QHS   Continuous Infusions: . sodium chloride    .  sodium chloride       LOS: 20 days    Time spent in minutes: [place], MD [facility] Pager: [email or web address] Password [facility] 04/25/2017, 1:37 PM

## 2017-04-25 NOTE — Progress Notes (Signed)
I continue to follow pt's progress. Last HD 3/21 with creat today noted. [phone]

## 2017-04-25 NOTE — Progress Notes (Signed)
Nutrition Follow-up  DOCUMENTATION CODES:   Morbid obesity  INTERVENTION:  Continue to monitor for needs Liberalize diet as possible Continue snacks  NUTRITION DIAGNOSIS:   Inadequate oral intake related to decreased appetite as evidenced by meal completion < 50%. -ongoing  GOAL:   Patient will meet greater than or equal to 90% of their needs -improving  MONITOR:   PO intake, Supplement acceptance  ASSESSMENT:   Pt with PMH of HTN, severe psych illness, bipolar 1, schizophrenia, and depression on multiple medications. Pt found down with possible overdose. Pt developed acute kidney injury per MD likely r/t ATN and severe rhabdo.   [name] did not eat much today, does not feel good, did not want to eat. RD offered patient options but she has declined them at this time. Will continue to monitor. Average PO intake 38% It improved following previous visit for a few days, seems to wax and wane. Will monitor.  Labs reviewed:  Phos 7.1  Medications reviewed and include:  Phoslo  Diet Order:  Diet renal with fluid restriction Fluid restriction: 1200 mL Fluid; Room service appropriate? Yes; Fluid consistency: Thin  EDUCATION NEEDS:   No education needs have been identified at this time  Skin:  Skin Assessment: Reviewed RN Assessment  Last BM:  04/18/2017  Height:   Ht Readings from Last 1 Encounters:  04/23/17 5' 6.5" (1.689 m)    Weight:   Wt Readings from Last 1 Encounters:  04/25/17 260 lb 3.2 oz (118 kg)    Ideal Body Weight:  60.23 kg  BMI:  Body mass index is 41.37 kg/m.  Estimated Nutritional Needs:   Kcal:  1900-2100  Protein:  110-132 grams  Fluid:  UOP +1L  Jenna Hill. Jenna Cavness, MS, RD LDN Inpatient Clinical Dietitian Pager [phone]

## 2017-04-25 NOTE — Progress Notes (Signed)
Physical Therapy Treatment Patient Details Name: Jenna Hill MRN: [id number] DOB: December 10, 1970 Today's Date: 04/25/2017    History of Present Illness pt is a 47 y/o female with pmh significant for schizophrenia, HTN, bipolar d/o and attempted suicide admitted with AMS and hypothermia, suspected intentional OD of psych medication.  Treating for aspiration and sepsis.  Extubated 3/8. Received HD on same day. has been on HD, there some hope for renal function recovery. She has been having leukocytosis of unclear etiology. She has been receiving Unasyn to cover for presume cellulitis of right lower extremity. She had CT hip that showed edema vs infection. She was evaluated by ortho and there was not concern for compartment syndrome.    PT Comments    Pt is making slow progress towards her goals, however continues to be limited in her safe mobility by SoB with ambulation and decreased strength and endurance. Pt is currently minA for bed mobility, min guard for transfers and minA for ambulation of a total of 66 feet with RW. Pt requires moderate encouragement to not want to sit immediately when she experiences SoB. Pt encourage to decreased her gait speed and take standing rest breaks to recover her breath and not sit immediately. D/c plans remain appropriate at this time to work on strength and endurance in the presence of decreased R LE sensation and strength. PT will continue to follow acutely until d/c.     Follow Up Recommendations  CIR     Equipment Recommendations  Rolling walker with 5" wheels;3in1 (PT)    Recommendations for Other Services Rehab consult     Precautions / Restrictions Precautions Precautions: Fall Precaution Comments: No pf/df at R Foot. Required Braces or Orthoses: Other Brace/Splint(cam boot) Restrictions Weight Bearing Restrictions: No    Mobility  Bed Mobility Overal bed mobility: Needs Assistance Bed Mobility: Supine to Sit;Sit to Supine     Supine to  sit: Min assist;HOB elevated     General bed mobility comments: Min A for R LE managment out of bed  Transfers Overall transfer level: Needs assistance Equipment used: Rolling walker (2 wheeled)   Sit to Stand: Min guard         General transfer comment: min guard for safety  Ambulation/Gait Ambulation/Gait assistance: Min assist Ambulation Distance (Feet): 66 Feet(3x12 feet, 1x30 ) Assistive device: Rolling walker (2 wheeled) Gait Pattern/deviations: Decreased stance time - right;Decreased step length - left Gait velocity: decreased Gait velocity interpretation: Below normal speed for age/gender General Gait Details: Min A to steady, pt fatigues quickly, requiring chair to follow closely behind. Worked with pt to slow down pace of ambulation so that she does not get as out of breath         Balance Overall balance assessment: Needs assistance Sitting-balance support: No upper extremity supported;Feet supported Sitting balance-Leahy Scale: Fair     Standing balance support: Bilateral upper extremity supported Standing balance-Leahy Scale: Poor Standing balance comment: requires RW for assist                            Cognition Arousal/Alertness: Awake/alert Behavior During Therapy: WFL for tasks assessed/performed Overall Cognitive Status: Within Functional Limits for tasks assessed                                               Pertinent  Vitals/Pain Pain Assessment: No/denies pain           PT Goals (current goals can now be found in the care plan section) Acute Rehab PT Goals Patient Stated Goal: to get stonger PT Goal Formulation: With patient Time For Goal Achievement: 04/22/17 Potential to Achieve Goals: Good    Frequency    Min 3X/week      PT Plan Current plan remains appropriate       AM-PAC PT "6 Clicks" Daily Activity  Outcome Measure  Difficulty turning over in bed (including adjusting bedclothes,  sheets and blankets)?: A Little Difficulty moving from lying on back to sitting on the side of the bed? : Unable Difficulty sitting down on and standing up from a chair with arms (e.g., wheelchair, bedside commode, etc,.)?: Unable Help needed moving to and from a bed to chair (including a wheelchair)?: A Little Help needed walking in hospital room?: A Little Help needed climbing 3-5 steps with a railing? : Total 6 Click Score: 12    End of Session Equipment Utilized During Treatment: Gait belt Activity Tolerance: Patient tolerated treatment well Patient left: with call bell/phone within reach;in bed;with bed alarm set Nurse Communication: Mobility status PT Visit Diagnosis: Unsteadiness on feet (R26.81);Other abnormalities of gait and mobility (R26.89);Muscle weakness (generalized) (M62.81);Other symptoms and signs involving the nervous system (G25.638)     Time: 9373-4287 PT Time Calculation (min) (ACUTE ONLY): 28 min  Charges:  $Gait Training: 8-22 mins $Therapeutic Activity: 8-22 mins                    G Codes:       Anesia Blackwell B. Migdalia Dk PT, DPT Acute Rehabilitation  [phone] Pager [phone]     [place] 04/25/2017, 2:27 PM

## 2017-04-26 DIAGNOSIS — T796XXS Traumatic ischemia of muscle, sequela: Secondary | ICD-10-CM

## 2017-04-26 LAB — RENAL FUNCTION PANEL
ALBUMIN: 2.3 g/dL — AB (ref 3.5–5.0)
Anion gap: 13 (ref 5–15)
BUN: 55 mg/dL — ABNORMAL HIGH (ref 6–20)
CHLORIDE: 100 mmol/L — AB (ref 101–111)
CO2: 24 mmol/L (ref 22–32)
Calcium: 9.5 mg/dL (ref 8.9–10.3)
Creatinine, Ser: 11.38 mg/dL — ABNORMAL HIGH (ref 0.44–1.00)
GFR calc Af Amer: 4 mL/min — ABNORMAL LOW (ref >60)
GFR calc non Af Amer: 4 mL/min — ABNORMAL LOW (ref >60)
GLUCOSE: 90 mg/dL (ref 65–99)
PHOSPHORUS: 6.9 mg/dL — AB (ref 2.5–4.6)
POTASSIUM: 4.5 mmol/L (ref 3.5–5.1)
Sodium: 137 mmol/L (ref 135–145)

## 2017-04-26 NOTE — Progress Notes (Signed)
[facility] ROUNDING NOTE   Subjective:   Interval History:  This is a very pleasant lady with bipolar disorder, schizophrenia, hypertension who presented to the hospital on 04/05/2017 obtunded with hypothermia and tachycardia.  It appears that she has had severe rhabdomyolysis and started dialysis 3/8   We shall watch for recovery  Comfortable night  Good urine output  Last dialysis 3/21  Creatinine now increased to 11.38  No indications for dialysis at this point  Appears to have plateaued   Objective:  Vital signs in last 24 hours:  Temp:  [99.2 F (37.3 C)-99.7 F (37.6 C)] 99.2 F (37.3 C) (03/26 0603) Pulse Rate:  [111] 111 (03/26 0603) Resp:  [18-20] 18 (03/26 0603) BP: (151-153)/(85-90) 151/85 (03/26 0603) SpO2:  [87 %-92 %] 87 % (03/26 0603)  Weight change:  Filed Weights   04/23/17 0553 04/24/17 0404 04/25/17 0425  Weight: 260 lb 5.8 oz (118.1 kg) 260 lb 9.6 oz (118.2 kg) 260 lb 3.2 oz (118 kg)    Intake/Output: I/O last 3 completed shifts: In: -  Out: 1400 [Urine:1400]   Intake/Output this shift:  Total I/O In: 120 [P.O.:120] Out: 350 [Urine:350]  CVS- RRR RS- CTA ABD- BS present soft non-distended EXT- no edema   Basic Metabolic Panel: Recent Labs  Lab 04/22/17 0403 04/23/17 0400 04/24/17 0404 04/25/17 0402 04/26/17 0446  NA 135 134* 136 135 137  K 4.0 4.0 4.7 4.4 4.5  CL 97* 97* 99* 96* 100*  CO2 25 24 25 23 24   GLUCOSE 95 86 96 93 90  BUN 25* 33* 42* 50* 55*  CREATININE 8.07* 9.81* 10.93* 11.44* 11.38*  CALCIUM 8.6* 8.8* 9.1 9.4 9.5  PHOS 5.4* 6.3* 6.5* 7.1* 6.9*    Liver Function Tests: Recent Labs  Lab 04/22/17 0403 04/23/17 0400 04/24/17 0404 04/25/17 0402 04/26/17 0446  ALBUMIN 2.2* 2.2* 2.2* 2.2* 2.3*   No results for input(s): LIPASE, AMYLASE in the last 168 hours. No results for input(s): AMMONIA in the last 168 hours.  CBC: Recent Labs  Lab 04/20/17 0342 04/22/17 0403 04/23/17 1741 04/24/17 0404  04/25/17 0402  WBC 17.7* 14.0* 12.7* 13.1* 11.9*  HGB 10.4* 10.0* 10.3* 9.8* 9.9*  HCT 31.3* 31.1* 31.4* 30.9* 30.9*  MCV 88.9 90.9 91.5 91.2 90.9  PLT 323 275 300 285 290    Cardiac Enzymes: No results for input(s): CKTOTAL, CKMB, CKMBINDEX, TROPONINI in the last 168 hours.  BNP: Invalid input(s): POCBNP  CBG: No results for input(s): GLUCAP in the last 168 hours.  Microbiology: Results for orders placed or performed during the hospital encounter of 04/05/17  Respiratory Panel by PCR     Status: None   Collection Time: 04/05/17  4:01 PM  Result Value Ref Range Status   Adenovirus NOT DETECTED NOT DETECTED Final   Coronavirus 229E NOT DETECTED NOT DETECTED Final   Coronavirus HKU1 NOT DETECTED NOT DETECTED Final   Coronavirus NL63 NOT DETECTED NOT DETECTED Final   Coronavirus OC43 NOT DETECTED NOT DETECTED Final   Metapneumovirus NOT DETECTED NOT DETECTED Final   Rhinovirus / Enterovirus NOT DETECTED NOT DETECTED Final   Influenza A NOT DETECTED NOT DETECTED Final   Influenza B NOT DETECTED NOT DETECTED Final   Parainfluenza Virus 1 NOT DETECTED NOT DETECTED Final   Parainfluenza Virus 2 NOT DETECTED NOT DETECTED Final   Parainfluenza Virus 3 NOT DETECTED NOT DETECTED Final   Parainfluenza Virus 4 NOT DETECTED NOT DETECTED Final   Respiratory Syncytial Virus NOT DETECTED  NOT DETECTED Final   Bordetella pertussis NOT DETECTED NOT DETECTED Final   Chlamydophila pneumoniae NOT DETECTED NOT DETECTED Final   Mycoplasma pneumoniae NOT DETECTED NOT DETECTED Final    Comment: Performed at [facility], [place] [address], [place], [place] [address]  Culture, blood (routine x 2)     Status: None   Collection Time: 04/05/17  5:51 PM  Result Value Ref Range Status   Specimen Description   Final    BLOOD RIGHT HAND Performed at [facility] [address] [place], [place] [address]    Special Requests   Final    IN PEDIATRIC BOTTLE Blood Culture adequate  volume Performed at [facility] [address], [place], [place] [address]    Culture   Final    NO GROWTH 5 DAYS Performed at [facility], [place] [address] [place], [place] [address]    Report Status 04/10/2017 FINAL  Final  Culture, blood (routine x 2)     Status: None   Collection Time: 04/05/17  5:51 PM  Result Value Ref Range Status   Specimen Description   Final    BLOOD RIGHT HAND Performed at [facility] [address] [place] [address], [place], [place] [address]    Special Requests   Final    IN PEDIATRIC BOTTLE Blood Culture adequate volume Performed at [facility] [address] [place], [place] [address]    Culture   Final    NO GROWTH 5 DAYS Performed at [facility], [place] [address], [place], [place] [address]    Report Status 04/10/2017 FINAL  Final  MRSA PCR Screening     Status: None   Collection Time: 04/05/17  6:13 PM  Result Value Ref Range Status   MRSA by PCR NEGATIVE NEGATIVE Final    Comment:        The GeneXpert MRSA Assay (FDA approved for NASAL specimens only), is one component of a comprehensive MRSA colonization surveillance program. It is not intended to diagnose MRSA infection nor to guide or monitor treatment for MRSA infections. Performed at [facility], [place] [address], [place], [place] [address]   Culture, Urine     Status: Abnormal   Collection Time: 04/11/17 10:49 PM  Result Value Ref Range Status   Specimen Description URINE, CLEAN CATCH  Final   Special Requests NONE  Final   Culture (A)  Final    <10,000 COLONIES/mL INSIGNIFICANT GROWTH Performed at [facility], [place] [address], [place], [place] [address]    Report Status 04/13/2017 FINAL  Final  Culture, blood (Routine X 2) w Reflex to ID Panel     Status: None   Collection Time: 04/12/17  6:26 PM  Result Value Ref Range Status   Specimen Description BLOOD RIGHT ARM  Final   Special Requests    Final    BOTTLES DRAWN AEROBIC AND ANAEROBIC Blood Culture adequate volume   Culture   Final    NO GROWTH 5 DAYS Performed at [facility], [place] [address], [place],  [address]    Report Status 04/17/2017 FINAL  Final  Culture, blood (Routine X 2) w Reflex to ID Panel     Status: None   Collection Time: 04/12/17  6:40 PM  Result Value Ref Range Status   Specimen Description BLOOD RIGHT HAND  Final   Special Requests IN PEDIATRIC BOTTLE Blood Culture adequate volume  Final   Culture   Final    NO GROWTH 5 DAYS Performed at [facility], [place] [address] [place],  [address]    Report Status 04/17/2017 FINAL  Final    Coagulation Studies: No results for input(s): LABPROT, INR in the last 72 hours.  Urinalysis: No results for input(s): COLORURINE, LABSPEC, PHURINE, GLUCOSEU, HGBUR, BILIRUBINUR, KETONESUR, PROTEINUR, UROBILINOGEN, NITRITE, LEUKOCYTESUR in the last 72 hours.  Invalid input(s): APPERANCEUR    Imaging: No results found.   Medications:   . sodium chloride    . sodium chloride     . acetaminophen  650 mg Oral TID  . calcium acetate  1,334 mg Oral TID WC  . Chlorhexidine Gluconate Cloth  6 each Topical Q0600  . cloZAPine  400 mg Oral QHS  . darbepoetin (ARANESP) injection - NON-DIALYSIS  100 mcg Subcutaneous Q Fri-1800  . diclofenac sodium  4 g Topical QID  . doxepin  50 mg Oral QHS  . fentaNYL  25 mcg Transdermal Q72H  . heparin injection (subcutaneous)  5,000 Units Subcutaneous Q8H  . LORazepam  1 mg Intravenous Once  . metoprolol tartrate  50 mg Oral BID  . pantoprazole  40 mg Oral BID AC  . QUEtiapine  400 mg Oral QHS  . vortioxetine HBr  20 mg Oral QHS   acetaminophen, ondansetron (ZOFRAN) IV, traMADol  Assessment/ Plan:  1 Acute renal failure- found down, AKI due to ATN from severe rhabdo- SP HD beginnin 3/8.Has Temp cath still.Continues with HD-variable UOP.  Will monitor and no dialysis today 2 Severe depression/  schizoaffective disorder - on meds 3 s/p Resp failure -  5 HTN -  Amlodipine 5 and metoprolol 50 mg       LOS: 21 Tika Hannis W @TODAY @2 :26 PM

## 2017-04-26 NOTE — Progress Notes (Addendum)
PROGRESS NOTE    Jenna Hill   QIH:[id number]  DOB: 09/09/1970  DOA: 04/05/2017 PCP: Alfonse Spruce, FNP   Brief Narrative:  Jenna Hill 47 year old with history of bipolar disorder, schizophrenia, hypertension who presented to the hospital on 04/05/2017 obtunded with hypothermia and tachycardia.  Narcan was given which did not help.   I saw her from 3/10 through 3/12 when she was in the SDU.   She stated that she was started on Abilify 15 mg and took the first dose the night before coming to the ER. Her roommate later found her unresponsive. She had not been sick with any other illness and no other changes in medications were made.  Patient was then intubated for airway protection & vomited during intubation.  She received 4 days of Unasyn for possible aspiration    Patient was given aggressive IV hydration for renal injury but Cr continued to worsen and was suspected to be ATN in relation to severe rhabdomyolysis.  Nephrology was consulted. On 3/8 she started on dialysis and also was able to be extubated.       She also had severe right leg swelling which I felt was the cause of her rhabdomyolysis on admission and suspected she had been laying on the leg when unresponsive. She had CT of the hip prior to my eval which showed some edema versus infection   Patient did develop some abdominal pain, CTs scan showed concern for pancreatitis.  She is noted to have gallstones on ultrasound however no evidence of cholecystitis.  Patient will eventually need GI and surgical outpatient follow-ups.   Subjective: Vomited last night after a cobb salad.    No other complaints.   Assessment & Plan:   Principal Problem:   AKI (acute kidney injury)  - suspected to be ATN from Rhabdo- as mentioned, dialysis started on 3/8 -  initially oliguric  - last dialyzed on 3/20- nephrology is following for further dialysis needs - Cr going up- per I and O in Epic, had 625 cc of urine yesterday  -  3/25- Cr stable today- may have peaked - had ~ 1 L urine output documented for yesterday  Active Problems:  Tachycarida/ HTN - HCTZ and Amlodipine used at home are on hold - she has remained tachycardic since admission- Metoprolol started by nephrology on 3/23 - TSH was normal - ECHO > EF 50-55 % and grade 1 dCHF    Schizoaffective disorder  - recommendations by psych on 3/15: "Continue home psychotropic medications as prescribed for schizoaffective disorder-  cont Clozaril until she reaches home dose" - Clozaril is at home dose and Trintellix, Seroquel and Doxepin which she used at home are being continued at home dose     Acute metabolic encephalopathy   Acute respiratory failure     Lactic acidosis/ hypothermia - occurring early on in the admission and all have resolved  - CT head negative, APAP and Salicylates negative- UDS negative - encephalopathy resolved- initial thought was she may have overdosed but later told us that she did not- cause still uncertain - she may have aspirated while unresponsive and was treated with Unasyn by ICU team    Right leg swelling - Rhabdomyolysis/ Elevated LFTs - as mentioned above, suspect she was laying on right leg with caused her rhabdo/ swelling  - Elevated LFTS may also been from rhabdo vs shock liver  -abdominal Ultrasound > cholelithiasis-  -Hepatitis B negative -CT abdomen showed possible pancreatitis.  Lipase was negative.  Patient denied abdominal pain. - leg remains mildly swollen- she currently has a Fentanyl patch, Tylenol TID and PRN Ultram ordered  Vomiting- intermittent - still able to eat most of her meals  - on Protonix- no cause found yet- ? Due to narcotics or uremia  Leukocytosis - steadily improving- stress response?  AOCD - currently stable Hb- follow  Hyperphosphatemia  -currently on phoslo TID   Deconditioned - CIR consulted- they are following her progress- with worsening Cr, she is not ready for d/c  yet  DVT prophylaxis: heparin Code Status: Full code Family Communication: case being discussed with patient Disposition Plan: not yet ready for d/c Consultants:   Nephrology  Psych  PCCM  -Ortho Procedures:   Intubation/ Extubation  Dialysis cath  04/10/17- - Bilateral LE venous duplex- no DVT- technically limited exam  2D ECHO - 04/06/17 -   mild LVH. Systolic function was normal- 55% to 60%.   Wall motion was normal; there were no regional wall motion   abnormalities- grade 1 diastolic dysfunction I   Antimicrobials:  Anti-infectives (From admission, onward)   Start     Dose/Rate Route Frequency Ordered Stop   04/13/17 2000  ceFAZolin (ANCEF) IVPB 1 g/50 mL premix     1 g 100 mL/hr over 30 Minutes Intravenous Every 24 hours 04/13/17 1401 04/19/17 2053   04/09/17 2000  Ampicillin-Sulbactam (UNASYN) 3 g in sodium chloride 0.9 % 100 mL IVPB  Status:  Discontinued     3 g 200 mL/hr over 30 Minutes Intravenous Every 24 hours 04/09/17 1119 04/11/17 1134   04/08/17 0600  Ampicillin-Sulbactam (UNASYN) 3 g in sodium chloride 0.9 % 100 mL IVPB  Status:  Discontinued     3 g 200 mL/hr over 30 Minutes Intravenous Every 24 hours 04/07/17 0719 04/09/17 1119   04/06/17 1800  Ampicillin-Sulbactam (UNASYN) 3 g in sodium chloride 0.9 % 100 mL IVPB  Status:  Discontinued     3 g 200 mL/hr over 30 Minutes Intravenous Every 12 hours 04/06/17 0942 04/07/17 0719   04/05/17 1800  Ampicillin-Sulbactam (UNASYN) 3 g in sodium chloride 0.9 % 100 mL IVPB  Status:  Discontinued     3 g 200 mL/hr over 30 Minutes Intravenous Every 6 hours 04/05/17 1620 04/06/17 0942   04/05/17 1630  Ampicillin-Sulbactam (UNASYN) 3 g in sodium chloride 0.9 % 100 mL IVPB     3 g 200 mL/hr over 30 Minutes Intravenous  Once 04/05/17 1608 04/05/17 1844       Objective: Vitals:   04/25/17 1352 04/25/17 2352 04/26/17 0603 04/26/17 1454  BP: (!) 165/92 (!) 153/90 (!) 151/85 (!) 181/103  Pulse: (!) 111 (!) 111 (!)  111 (!) 116  Resp: 20 20 18 20   Temp: 99.2 F (37.3 C) 99.7 F (37.6 C) 99.2 F (37.3 C) 98.7 F (37.1 C)  TempSrc: Oral Oral Oral Oral  SpO2: 94% 92% (!) 87% 94%  Weight:      Height:        Intake/Output Summary (Last 24 hours) at 04/26/2017 1642 Last data filed at 04/26/2017 0905 Gross per 24 hour  Intake 120 ml  Output 1100 ml  Net -980 ml   Filed Weights   04/23/17 0553 04/24/17 0404 04/25/17 0425  Weight: 118.1 kg (260 lb 5.8 oz) 118.2 kg (260 lb 9.6 oz) 118 kg (260 lb 3.2 oz)    Examination: General exam: Appears comfortable  HEENT: PERRLA, oral mucosa moist, no sclera icterus or thrush Respiratory system: Clear  to auscultation. Respiratory effort normal. Cardiovascular system: S1 & S2 heard, RRR.  Mild tachycardia Gastrointestinal system: Abdomen soft, non-tender, nondistended. Normal bowel sound. No organomegaly Central nervous system: Alert and oriented. No focal neurological deficits. Extremities: No cyanosis, clubbing - right leg swelling and tenderness noted  Skin: No rashes or ulcers Psychiatry:  Flat affect  Data Reviewed: I have personally reviewed following labs and imaging studies  CBC: Recent Labs  Lab 04/20/17 0342 04/22/17 0403 04/23/17 1741 04/24/17 0404 04/25/17 0402  WBC 17.7* 14.0* 12.7* 13.1* 11.9*  HGB 10.4* 10.0* 10.3* 9.8* 9.9*  HCT 31.3* 31.1* 31.4* 30.9* 30.9*  MCV 88.9 90.9 91.5 91.2 90.9  PLT 323 275 300 285 034   Basic Metabolic Panel: Recent Labs  Lab 04/22/17 0403 04/23/17 0400 04/24/17 0404 04/25/17 0402 04/26/17 0446  NA 135 134* 136 135 137  K 4.0 4.0 4.7 4.4 4.5  CL 97* 97* 99* 96* 100*  CO2 25 24 25 23 24   GLUCOSE 95 86 96 93 90  BUN 25* 33* 42* 50* 55*  CREATININE 8.07* 9.81* 10.93* 11.44* 11.38*  CALCIUM 8.6* 8.8* 9.1 9.4 9.5  PHOS 5.4* 6.3* 6.5* 7.1* 6.9*   GFR: Estimated Creatinine Clearance: 8.1 mL/min (A) (by C-G formula based on SCr of 11.38 mg/dL (H)). Liver Function Tests: Recent Labs  Lab  04/22/17 0403 04/23/17 0400 04/24/17 0404 04/25/17 0402 04/26/17 0446  ALBUMIN 2.2* 2.2* 2.2* 2.2* 2.3*   No results for input(s): LIPASE, AMYLASE in the last 168 hours. No results for input(s): AMMONIA in the last 168 hours. Coagulation Profile: No results for input(s): INR, PROTIME in the last 168 hours. Cardiac Enzymes: No results for input(s): CKTOTAL, CKMB, CKMBINDEX, TROPONINI in the last 168 hours. BNP (last 3 results) No results for input(s): PROBNP in the last 8760 hours. HbA1C: No results for input(s): HGBA1C in the last 72 hours. CBG: No results for input(s): GLUCAP in the last 168 hours. Lipid Profile: No results for input(s): CHOL, HDL, LDLCALC, TRIG, CHOLHDL, LDLDIRECT in the last 72 hours. Thyroid Function Tests: No results for input(s): TSH, T4TOTAL, FREET4, T3FREE, THYROIDAB in the last 72 hours. Anemia Panel: No results for input(s): VITAMINB12, FOLATE, FERRITIN, TIBC, IRON, RETICCTPCT in the last 72 hours. Urine analysis:    Component Value Date/Time   COLORURINE BROWN (A) 04/11/2017 1814   APPEARANCEUR TURBID (A) 04/11/2017 1814   LABSPEC 1.020 04/11/2017 1814   PHURINE 7.0 04/11/2017 1814   GLUCOSEU NEGATIVE 04/11/2017 1814   HGBUR LARGE (A) 04/11/2017 1814   BILIRUBINUR SMALL (A) 04/11/2017 1814   KETONESUR NEGATIVE 04/11/2017 1814   PROTEINUR 100 (A) 04/11/2017 1814   UROBILINOGEN 0.2 08/02/2008 0812   NITRITE POSITIVE (A) 04/11/2017 1814   LEUKOCYTESUR TRACE (A) 04/11/2017 1814   Sepsis Labs: @LABRCNTIP (procalcitonin:4,lacticidven:4) )No results found for this or any previous visit (from the past 240 hour(s)).   Radiology Studies: No results found.  Scheduled Meds: . acetaminophen  650 mg Oral TID  . calcium acetate  1,334 mg Oral TID WC  . Chlorhexidine Gluconate Cloth  6 each Topical Q0600  . cloZAPine  400 mg Oral QHS  . darbepoetin (ARANESP) injection - NON-DIALYSIS  100 mcg Subcutaneous Q Fri-1800  . diclofenac sodium  4 g Topical  QID  . doxepin  50 mg Oral QHS  . fentaNYL  25 mcg Transdermal Q72H  . heparin injection (subcutaneous)  5,000 Units Subcutaneous Q8H  . LORazepam  1 mg Intravenous Once  . metoprolol tartrate  50 mg Oral  BID  . pantoprazole  40 mg Oral BID AC  . QUEtiapine  400 mg Oral QHS  . vortioxetine HBr  20 mg Oral QHS   Continuous Infusions: . sodium chloride    . sodium chloride       LOS: 21 days    Time spent in minutes: [place], MD [facility] Pager: [email or web address] Password [facility] 04/26/2017, 4:42 PM

## 2017-04-27 LAB — RENAL FUNCTION PANEL
ALBUMIN: 2.3 g/dL — AB (ref 3.5–5.0)
Anion gap: 13 (ref 5–15)
BUN: 55 mg/dL — AB (ref 6–20)
CALCIUM: 9.5 mg/dL (ref 8.9–10.3)
CO2: 25 mmol/L (ref 22–32)
Chloride: 100 mmol/L — ABNORMAL LOW (ref 101–111)
Creatinine, Ser: 10.62 mg/dL — ABNORMAL HIGH (ref 0.44–1.00)
GFR calc Af Amer: 4 mL/min — ABNORMAL LOW (ref >60)
GFR calc non Af Amer: 4 mL/min — ABNORMAL LOW (ref >60)
GLUCOSE: 93 mg/dL (ref 65–99)
Phosphorus: 6.4 mg/dL — ABNORMAL HIGH (ref 2.5–4.6)
Potassium: 4 mmol/L (ref 3.5–5.1)
SODIUM: 138 mmol/L (ref 135–145)

## 2017-04-27 MED ORDER — LABETALOL HCL 5 MG/ML IV SOLN
2.5000 mg/min | Freq: Once | INTRAVENOUS | Status: DC
  Filled 2017-04-27: qty 100

## 2017-04-27 MED ORDER — METOPROLOL TARTRATE 50 MG PO TABS
75.0000 mg | ORAL_TABLET | Freq: Two times a day (BID) | ORAL | Status: DC
  Administered 2017-04-27 – 2017-04-29 (×5): 75 mg via ORAL
  Filled 2017-04-27 (×5): qty 1

## 2017-04-27 MED ORDER — LABETALOL HCL 5 MG/ML IV SOLN
2.5000 mg | Freq: Once | INTRAVENOUS | Status: AC
  Administered 2017-04-27: 2.5 mg via INTRAVENOUS
  Filled 2017-04-27: qty 4

## 2017-04-27 MED ORDER — HYDRALAZINE HCL 20 MG/ML IJ SOLN
5.0000 mg | Freq: Once | INTRAMUSCULAR | Status: DC
  Filled 2017-04-27: qty 1

## 2017-04-27 NOTE — Progress Notes (Signed)
  [facility] Progress Note   This is a very pleasant lady with bipolar disorder, schizophrenia, hypertension who presented to the hospital on 04/05/2017 obtunded with hypothermia and tachycardia.  It appears that she has had severe rhabdomyolysis and started dialysis 3/8   We shall watch for recovery   Assessment/ Plan:   1 Acute renal failure- found down, AKI due to ATN from severe rhabdo- SP HD beginning 3/8.Has Temp cath still (last HD 3/21). UOP may be picking up.  Will monitor and no dialysis today - anticipate improvement in renal function soon. Hopefully we can pull the catheter within the 5 days. 2 Severe depression/ schizoaffective disorder - on meds 3 s/pResp failure -  5 HTN -  Amlodipine 5 and metoprolol 50 mg     Subjective:   No indications for dialysis at this point  Appears to have plateaued  C/O right leg pain Denies f/c/n/v.   Objective:   BP (!) 164/100 (BP Location: Left Arm)   Pulse (!) 113   Temp 98.3 F (36.8 C) (Oral)   Resp (!) 24   Ht 5' 6.5" (1.689 m)   Wt 118.3 kg (260 lb 12.9 oz)   SpO2 95%   BMI 41.46 kg/m   Intake/Output Summary (Last 24 hours) at 04/27/2017 1715 Last data filed at 04/27/2017 1600 Gross per 24 hour  Intake 480 ml  Output 550 ml  Net -70 ml   Weight change:   Physical Exam: GEN - NAD A&Ox3 CVS- RRR RS- CTA ABD- BS present soft non-distended EXT- edema in rt leg > lt leg     Imaging: No results found.  Labs: BMET Recent Labs  Lab 04/21/17 0412 04/22/17 0403 04/23/17 0400 04/24/17 0404 04/25/17 0402 04/26/17 0446 04/27/17 0458  NA 135 135 134* 136 135 137 138  K 3.4* 4.0 4.0 4.7 4.4 4.5 4.0  CL 96* 97* 97* 99* 96* 100* 100*  CO2 26 25 24 25 23 24 25   GLUCOSE 135* 95 86 96 93 90 93  BUN 15 25* 33* 42* 50* 55* 55*  CREATININE 5.87* 8.07* 9.81* 10.93* 11.44* 11.38* 10.62*  CALCIUM 8.0* 8.6* 8.8* 9.1 9.4 9.5 9.5  PHOS 3.4 5.4* 6.3* 6.5* 7.1* 6.9* 6.4*   CBC Recent Labs   Lab 04/22/17 0403 04/23/17 1741 04/24/17 0404 04/25/17 0402  WBC 14.0* 12.7* 13.1* 11.9*  HGB 10.0* 10.3* 9.8* 9.9*  HCT 31.1* 31.4* 30.9* 30.9*  MCV 90.9 91.5 91.2 90.9  PLT 275 300 285 290    Medications:    . acetaminophen  650 mg Oral TID  . calcium acetate  1,334 mg Oral TID WC  . Chlorhexidine Gluconate Cloth  6 each Topical Q0600  . cloZAPine  400 mg Oral QHS  . darbepoetin (ARANESP) injection - NON-DIALYSIS  100 mcg Subcutaneous Q Fri-1800  . diclofenac sodium  4 g Topical QID  . doxepin  50 mg Oral QHS  . fentaNYL  25 mcg Transdermal Q72H  . heparin injection (subcutaneous)  5,000 Units Subcutaneous Q8H  . LORazepam  1 mg Intravenous Once  . metoprolol tartrate  75 mg Oral BID  . pantoprazole  40 mg Oral BID AC  . QUEtiapine  400 mg Oral QHS  . vortioxetine HBr  20 mg Oral QHS      Jenna Santee, MD 04/27/2017, 5:15 PM

## 2017-04-27 NOTE — Progress Notes (Signed)
Physical Therapy Treatment Patient Details Name: Jenna Hill MRN: [id number] DOB: [birth year] Today's Date: 04/27/2017    History of Present Illness pt is a 47 y/o female with pmh significant for schizophrenia, HTN, bipolar d/o and attempted suicide admitted with AMS and hypothermia, suspected intentional OD of psych medication.  Treating for aspiration and sepsis.  Extubated 3/8. Received HD on same day. has been on HD, there some hope for renal function recovery. She has been having leukocytosis of unclear etiology. She has been receiving Unasyn to cover for presume cellulitis of right lower extremity. She had CT hip that showed edema vs infection. She was evaluated by ortho and there was not concern for compartment syndrome.    PT Comments    Pt with increased R LE pain and swelling today and as a result pt declines to get out of bed. Pt reluctantly agrees to bed level exercise. Pt also agrees to don CAM walker to maintain R ankle dorsiflexion. D/c plans continue to remain appropriate at this time, expecting her to progress once pain and swelling are controled. PT will continue to follow acutely.     Follow Up Recommendations  CIR     Equipment Recommendations  Rolling walker with 5" wheels;3in1 (PT)    Recommendations for Other Services Rehab consult     Precautions / Restrictions Precautions Precautions: Fall Precaution Comments: No pf/df at R Foot. Required Braces or Orthoses: Other Brace/Splint(cam boot) Restrictions Weight Bearing Restrictions: No          Cognition Arousal/Alertness: Awake/alert Behavior During Therapy: WFL for tasks assessed/performed Overall Cognitive Status: Within Functional Limits for tasks assessed                                        Exercises General Exercises - Lower Extremity Ankle Circles/Pumps: AAROM;Right;AROM;Left;10 reps;Supine Quad Sets: AROM;Both;Supine Gluteal Sets: AROM;Both;10 reps;Supine Heel Slides:  AAROM;Right;AROM;Left;10 reps;Supine Hip ABduction/ADduction: AAROM;Right;AROM;Left;10 reps;Supine Straight Leg Raises: AAROM;Both;10 reps;Supine Other Exercises Other Exercises: R Ankle dorsiflexion with orange band x 10          Pertinent Vitals/Pain Pain Assessment: 0-10 Pain Score: 8  Pain Location: RLE Pain Descriptors / Indicators: Sore;Tightness;Tingling;Grimacing Pain Intervention(s): Monitored during session;Limited activity within patient's tolerance;Premedicated before session;Repositioned           PT Goals (current goals can now be found in the care plan section) Acute Rehab PT Goals Patient Stated Goal: to get stonger PT Goal Formulation: With patient Time For Goal Achievement: 04/22/17 Potential to Achieve Goals: Good Progress towards PT goals: Not progressing toward goals - comment(pt with increased swelling and pain in RLE declines OOB)    Frequency    Min 3X/week      PT Plan Current plan remains appropriate       AM-PAC PT "6 Clicks" Daily Activity  Outcome Measure  Difficulty turning over in bed (including adjusting bedclothes, sheets and blankets)?: A Little Difficulty moving from lying on back to sitting on the side of the bed? : Unable Difficulty sitting down on and standing up from a chair with arms (e.g., wheelchair, bedside commode, etc,.)?: Unable Help needed moving to and from a bed to chair (including a wheelchair)?: A Little Help needed walking in hospital room?: A Little Help needed climbing 3-5 steps with a railing? : Total 6 Click Score: 12    End of Session Equipment Utilized During Treatment: Gait belt Activity Tolerance: Patient  tolerated treatment well Patient left: with call bell/phone within reach;in bed;with bed alarm set Nurse Communication: Mobility status PT Visit Diagnosis: Unsteadiness on feet (R26.81);Other abnormalities of gait and mobility (R26.89);Muscle weakness (generalized) (M62.81);Other symptoms and signs  involving the nervous system (N86.767)     Time: 2094-7096 PT Time Calculation (min) (ACUTE ONLY): 17 min  Charges:  $Therapeutic Exercise: 8-22 mins                    G Codes:       Tennille Montelongo B. Migdalia Dk PT, DPT Acute Rehabilitation  [phone] Pager [phone]     [place] 04/27/2017, 4:46 PM

## 2017-04-27 NOTE — Progress Notes (Signed)
I met with pt at bedside. I discussed the need for her to ask Nursing to assist her up into the chair daily for when I round on her, she is always in the bed. She is getting up to [facility] with assistance. I await medical readiness to admit her to inpt rehab pending bed availability when pt medically ready for d/c. [phone]

## 2017-04-27 NOTE — Progress Notes (Signed)
PROGRESS NOTE    Jenna Hill   HEN:[id number]  DOB: Mar 19, 1970  DOA: 04/05/2017 PCP: Alfonse Spruce, FNP   Brief Narrative:  Jenna Hill 47 year old with history of bipolar disorder, schizophrenia, hypertension who presented to the hospital on 04/05/2017 obtunded with hypothermia and tachycardia.  Narcan was given which did not help.   I saw her from 3/10 through 3/12 when she was in the SDU.   She stated that she was started on Abilify 15 mg and took the first dose the night before coming to the ER. Her roommate later found her unresponsive. She had not been sick with any other illness and no other changes in medications were made.  Patient was then intubated for airway protection & vomited during intubation.  She received 4 days of Unasyn for possible aspiration    Patient was given aggressive IV hydration for renal injury but Cr continued to worsen and was suspected to be ATN in relation to severe rhabdomyolysis.  Nephrology was consulted. On 3/8 she started on dialysis and also was able to be extubated.     She also had severe right leg swelling which I felt was the cause of her rhabdomyolysis on admission and suspected she had been laying on the leg when unresponsive. She had CT of the hip prior to my eval which showed some edema versus infection  Patient did develop some abdominal pain, CTs scan showed concern for pancreatitis.  She is noted to have gallstones on ultrasound however no evidence of cholecystitis.  Patient will eventually need GI and surgical outpatient follow-ups.   Subjective: Pt reports no new problems today.   Assessment & Plan:   Principal Problem:   AKI (acute kidney injury)  - suspected to be ATN from Rhabdo- as mentioned, dialysis started on 3/8 -  initially oliguric  - last dialyzed on 3/20- nephrology is following for further dialysis needs - Cr going up- per I and O in Epic, had 625 cc of urine yesterday  - 3/25- Cr stable today- may have  peaked - had ~ 1 L urine output documented for yesterday  Active Problems:  Tachycarida/ HTN - HCTZ and Amlodipine used at home are on hold - she has remained tachycardic since admission- Metoprolol started by nephrology on 3/23 (increased dose today) - TSH was normal - ECHO > EF 50-55 % and grade 1 dCHF    Schizoaffective disorder  - recommendations by psych on 3/15: "Continue home psychotropic medications as prescribed for schizoaffective disorder-  cont Clozaril until she reaches home dose" - Clozaril is at home dose and Trintellix, Seroquel and Doxepin which she used at home are being continued at home dose     Acute metabolic encephalopathy   Acute respiratory failure     Lactic acidosis/ hypothermia - occurring early on in the admission and all have resolved  - CT head negative, APAP and Salicylates negative- UDS negative - encephalopathy resolved- initial thought was she may have overdosed but later told us that she did not- cause still uncertain - she may have aspirated while unresponsive and was treated with Unasyn by ICU team    Right leg swelling - Rhabdomyolysis/ Elevated LFTs - as mentioned above, suspect she was laying on right leg with caused her rhabdo/ swelling  - Elevated LFTS may also been from rhabdo vs shock liver  -abdominal Ultrasound > cholelithiasis-  -Hepatitis B negative -CT abdomen showed possible pancreatitis.  Lipase was negative.  Patient denied abdominal pain. - leg remains  mildly swollen- she currently has a Fentanyl patch, Tylenol TID and PRN Ultram ordered  Vomiting- intermittent - still able to eat most of her meals  - on Protonix- no cause found yet- ? Due to narcotics or uremia  Leukocytosis - currently suspecting stress reaction.  AOCD - currently stable Hb- follow  Hyperphosphatemia  -currently on phoslo TID   Deconditioned - CIR consulted- they are following her progress- with worsening Cr, she is not ready for d/c yet  DVT  prophylaxis: heparin Code Status: Full code Family Communication: case being discussed with patient Disposition Plan: once cleared for d/c by nephrology Consultants:   Nephrology  Psych  PCCM  -Ortho Procedures:   Intubation/ Extubation  Dialysis cath  04/10/17- - Bilateral LE venous duplex- no DVT- technically limited exam  2D ECHO - 04/06/17 -   mild LVH. Systolic function was normal- 55% to 60%.   Wall motion was normal; there were no regional wall motion   abnormalities- grade 1 diastolic dysfunction I   Antimicrobials:  Anti-infectives (From admission, onward)   Start     Dose/Rate Route Frequency Ordered Stop   04/13/17 2000  ceFAZolin (ANCEF) IVPB 1 g/50 mL premix     1 g 100 mL/hr over 30 Minutes Intravenous Every 24 hours 04/13/17 1401 04/19/17 2053   04/09/17 2000  Ampicillin-Sulbactam (UNASYN) 3 g in sodium chloride 0.9 % 100 mL IVPB  Status:  Discontinued     3 g 200 mL/hr over 30 Minutes Intravenous Every 24 hours 04/09/17 1119 04/11/17 1134   04/08/17 0600  Ampicillin-Sulbactam (UNASYN) 3 g in sodium chloride 0.9 % 100 mL IVPB  Status:  Discontinued     3 g 200 mL/hr over 30 Minutes Intravenous Every 24 hours 04/07/17 0719 04/09/17 1119   04/06/17 1800  Ampicillin-Sulbactam (UNASYN) 3 g in sodium chloride 0.9 % 100 mL IVPB  Status:  Discontinued     3 g 200 mL/hr over 30 Minutes Intravenous Every 12 hours 04/06/17 0942 04/07/17 0719   04/05/17 1800  Ampicillin-Sulbactam (UNASYN) 3 g in sodium chloride 0.9 % 100 mL IVPB  Status:  Discontinued     3 g 200 mL/hr over 30 Minutes Intravenous Every 6 hours 04/05/17 1620 04/06/17 0942   04/05/17 1630  Ampicillin-Sulbactam (UNASYN) 3 g in sodium chloride 0.9 % 100 mL IVPB     3 g 200 mL/hr over 30 Minutes Intravenous  Once 04/05/17 1608 04/05/17 1844       Objective: Vitals:   04/26/17 2357 04/27/17 0500 04/27/17 0645 04/27/17 1328  BP: 139/85  (!) 171/104 (!) 164/100  Pulse: (!) 106  (!) 124 (!) 113    Resp:   20 (!) 24  Temp: 98.6 F (37 C)  98.4 F (36.9 C) 98.3 F (36.8 C)  TempSrc: Oral  Oral Oral  SpO2:   100% 95%  Weight:  118.3 kg (260 lb 12.9 oz)    Height:        Intake/Output Summary (Last 24 hours) at 04/27/2017 1451 Last data filed at 04/27/2017 1441 Gross per 24 hour  Intake 480 ml  Output 550 ml  Net -70 ml   Filed Weights   04/24/17 0404 04/25/17 0425 04/27/17 0500  Weight: 118.2 kg (260 lb 9.6 oz) 118 kg (260 lb 3.2 oz) 118.3 kg (260 lb 12.9 oz)    Examination: General exam: Appears comfortable, in nad. HEENT: PERRLA, oral mucosa moist, no sclera icterus or thrush Respiratory system: Clear to auscultation. Respiratory effort normal.  Equal chest rise.  Cardiovascular system: S1 & S2 heard, RRR.  Mild tachycardia Gastrointestinal system: Abdomen soft, non-tender, nondistended. Normal bowel sound. No organomegaly Central nervous system: Alert and oriented. No focal neurological deficits. Extremities: No cyanosis, clubbing - right leg swelling and tenderness noted  Skin: No rashes or ulcers Psychiatry:  Flat affect  Data Reviewed: I have personally reviewed following labs and imaging studies  CBC: Recent Labs  Lab 04/22/17 0403 04/23/17 1741 04/24/17 0404 04/25/17 0402  WBC 14.0* 12.7* 13.1* 11.9*  HGB 10.0* 10.3* 9.8* 9.9*  HCT 31.1* 31.4* 30.9* 30.9*  MCV 90.9 91.5 91.2 90.9  PLT 275 300 285 160   Basic Metabolic Panel: Recent Labs  Lab 04/23/17 0400 04/24/17 0404 04/25/17 0402 04/26/17 0446 04/27/17 0458  NA 134* 136 135 137 138  K 4.0 4.7 4.4 4.5 4.0  CL 97* 99* 96* 100* 100*  CO2 24 25 23 24 25   GLUCOSE 86 96 93 90 93  BUN 33* 42* 50* 55* 55*  CREATININE 9.81* 10.93* 11.44* 11.38* 10.62*  CALCIUM 8.8* 9.1 9.4 9.5 9.5  PHOS 6.3* 6.5* 7.1* 6.9* 6.4*   GFR: Estimated Creatinine Clearance: 8.7 mL/min (A) (by C-G formula based on SCr of 10.62 mg/dL (H)). Liver Function Tests: Recent Labs  Lab 04/23/17 0400 04/24/17 0404  04/25/17 0402 04/26/17 0446 04/27/17 0458  ALBUMIN 2.2* 2.2* 2.2* 2.3* 2.3*   No results for input(s): LIPASE, AMYLASE in the last 168 hours. No results for input(s): AMMONIA in the last 168 hours. Coagulation Profile: No results for input(s): INR, PROTIME in the last 168 hours. Cardiac Enzymes: No results for input(s): CKTOTAL, CKMB, CKMBINDEX, TROPONINI in the last 168 hours. BNP (last 3 results) No results for input(s): PROBNP in the last 8760 hours. HbA1C: No results for input(s): HGBA1C in the last 72 hours. CBG: No results for input(s): GLUCAP in the last 168 hours. Lipid Profile: No results for input(s): CHOL, HDL, LDLCALC, TRIG, CHOLHDL, LDLDIRECT in the last 72 hours. Thyroid Function Tests: No results for input(s): TSH, T4TOTAL, FREET4, T3FREE, THYROIDAB in the last 72 hours. Anemia Panel: No results for input(s): VITAMINB12, FOLATE, FERRITIN, TIBC, IRON, RETICCTPCT in the last 72 hours. Urine analysis:    Component Value Date/Time   COLORURINE BROWN (A) 04/11/2017 1814   APPEARANCEUR TURBID (A) 04/11/2017 1814   LABSPEC 1.020 04/11/2017 1814   PHURINE 7.0 04/11/2017 1814   GLUCOSEU NEGATIVE 04/11/2017 1814   HGBUR LARGE (A) 04/11/2017 1814   BILIRUBINUR SMALL (A) 04/11/2017 1814   KETONESUR NEGATIVE 04/11/2017 1814   PROTEINUR 100 (A) 04/11/2017 1814   UROBILINOGEN 0.2 08/02/2008 0812   NITRITE POSITIVE (A) 04/11/2017 1814   LEUKOCYTESUR TRACE (A) 04/11/2017 1814   Sepsis Labs: @LABRCNTIP (procalcitonin:4,lacticidven:4) )No results found for this or any previous visit (from the past 240 hour(s)).   Radiology Studies: No results found.  Scheduled Meds: . acetaminophen  650 mg Oral TID  . calcium acetate  1,334 mg Oral TID WC  . Chlorhexidine Gluconate Cloth  6 each Topical Q0600  . cloZAPine  400 mg Oral QHS  . darbepoetin (ARANESP) injection - NON-DIALYSIS  100 mcg Subcutaneous Q Fri-1800  . diclofenac sodium  4 g Topical QID  . doxepin  50 mg Oral QHS   . fentaNYL  25 mcg Transdermal Q72H  . heparin injection (subcutaneous)  5,000 Units Subcutaneous Q8H  . LORazepam  1 mg Intravenous Once  . metoprolol tartrate  75 mg Oral BID  . pantoprazole  40 mg Oral  BID AC  . QUEtiapine  400 mg Oral QHS  . vortioxetine HBr  20 mg Oral QHS   Continuous Infusions: . sodium chloride    . sodium chloride       LOS: 22 days    Time spent in minutes: 30 min  Velvet Bathe, MD [facility] Pager: [email or web address] Password [facility] 04/27/2017, 2:51 PM

## 2017-04-27 NOTE — Progress Notes (Signed)
Pt's AM BP is 171/104; HR is 124. Dr Wendee Beavers was notified. He gave a verbal order of Labetalol 2.5mg  once. Care will be handed over to the day RN.

## 2017-04-28 LAB — RENAL FUNCTION PANEL
ALBUMIN: 2 g/dL — AB (ref 3.5–5.0)
Anion gap: 13 (ref 5–15)
BUN: 51 mg/dL — ABNORMAL HIGH (ref 6–20)
CALCIUM: 9.2 mg/dL (ref 8.9–10.3)
CHLORIDE: 100 mmol/L — AB (ref 101–111)
CO2: 26 mmol/L (ref 22–32)
CREATININE: 9.72 mg/dL — AB (ref 0.44–1.00)
GFR, EST AFRICAN AMERICAN: 5 mL/min — AB (ref >60)
GFR, EST NON AFRICAN AMERICAN: 4 mL/min — AB (ref >60)
Glucose, Bld: 88 mg/dL (ref 65–99)
PHOSPHORUS: 6.2 mg/dL — AB (ref 2.5–4.6)
Potassium: 3.7 mmol/L (ref 3.5–5.1)
Sodium: 139 mmol/L (ref 135–145)

## 2017-04-28 NOTE — Progress Notes (Signed)
  Jenna Hill Progress Note   This is a very pleasant lady with bipolar disorder, schizophrenia, hypertension who presented to the hospital on 04/05/2017 obtunded with hypothermia and tachycardia.  It appears that she has had severe rhabdomyolysis and started dialysis 3/8   Assessment/ Plan:   1 Acute renal failure- found down, AKI due to ATN from severe rhabdo- SP HD beginning 3/8.Has Temp cath still (last HD 3/21). UOP may be picking up. Will monitor and no dialysis today - anticipate improvement in renal function soon. Hopefully we can pull the catheter within the 5 days. - Will plan for pulling catheter on Monday if trend continues. 2 Severe depression/ schizoaffective disorder - on meds 3 s/pResp failure -  5 HTN - Amlodipine 5 and metoprolol 50 mg     Subjective:   No indications for dialysis at this point  Right leg pain; states tylenol not adequate. Denies f/c/n/v/dyspnea   Objective:   BP (!) 157/97   Pulse (!) 108   Temp 98.7 F (37.1 C) (Oral)   Resp 20   Ht 5' 6.5" (1.689 m)   Wt 119 kg (262 lb 5.6 oz)   SpO2 98%   BMI 41.71 kg/m   Intake/Output Summary (Last 24 hours) at 04/28/2017 1342 Last data filed at 04/27/2017 2058 Gross per 24 hour  Intake 240 ml  Output 325 ml  Net -85 ml   Weight change: 0.7 kg (1 lb 8.7 oz)  Physical Exam: GEN - NAD A&Ox3 CVS- RRR RS- CTA ABD- BS present soft non-distended EXT- edema in rt leg > lt leg    Imaging: No results found.  Labs: BMET Recent Labs  Lab 04/22/17 0403 04/23/17 0400 04/24/17 0404 04/25/17 0402 04/26/17 0446 04/27/17 0458 04/28/17 0439  NA 135 134* 136 135 137 138 139  K 4.0 4.0 4.7 4.4 4.5 4.0 3.7  CL 97* 97* 99* 96* 100* 100* 100*  CO2 25 24 25 23 24 25 26   GLUCOSE 95 86 96 93 90 93 88  BUN 25* 33* 42* 50* 55* 55* 51*  CREATININE 8.07* 9.81* 10.93* 11.44* 11.38* 10.62* 9.72*  CALCIUM 8.6* 8.8* 9.1 9.4 9.5 9.5 9.2  PHOS 5.4* 6.3* 6.5* 7.1* 6.9* 6.4* 6.2*    CBC Recent Labs  Lab 04/22/17 0403 04/23/17 1741 04/24/17 0404 04/25/17 0402  WBC 14.0* 12.7* 13.1* 11.9*  HGB 10.0* 10.3* 9.8* 9.9*  HCT 31.1* 31.4* 30.9* 30.9*  MCV 90.9 91.5 91.2 90.9  PLT 275 300 285 290    Medications:    . acetaminophen  650 mg Oral TID  . calcium acetate  1,334 mg Oral TID WC  . Chlorhexidine Gluconate Cloth  6 each Topical Q0600  . cloZAPine  400 mg Oral QHS  . darbepoetin (ARANESP) injection - NON-DIALYSIS  100 mcg Subcutaneous Q Fri-1800  . diclofenac sodium  4 g Topical QID  . doxepin  50 mg Oral QHS  . fentaNYL  25 mcg Transdermal Q72H  . heparin injection (subcutaneous)  5,000 Units Subcutaneous Q8H  . hydrALAZINE  5 mg Intravenous Once  . LORazepam  1 mg Intravenous Once  . metoprolol tartrate  75 mg Oral BID  . pantoprazole  40 mg Oral BID AC  . QUEtiapine  400 mg Oral QHS  . vortioxetine HBr  20 mg Oral QHS      Otelia Santee, MD 04/28/2017, 1:42 PM

## 2017-04-28 NOTE — Progress Notes (Signed)
I met at bedside with pt. Noted pt would only do bed level activities with therapy yesterday. I once again explained that inpt rehab has expectations of participation in 3 hrs per day of therapy per day and up out of bed for a few hours at a time.Pt states she just got up with CNA to [facility] and I explained that limited mobility is not skilled therapy services and bed level exercises with therapy is not seen as full participation with therapy. Pt case was discussed in Quality collaborative with RN CM and SW. Today I am unable to offer pt admission to inpt rehab for she is currently not demonstrating full participation in more intense therapies. Pt seen daily in bed on my rounds. I encouraged her to ask for assist up in to chair daily and to fully participate in therapies. Her current options would be home with [facility] or SNF unless she decides and demonstrates increased activity tolerance. I will follow up tomorrow if pt remains in house. [phone]

## 2017-04-28 NOTE — Progress Notes (Signed)
PROGRESS NOTE    Jenna Hill   HEN:[id number]  DOB: [birth year]  DOA: 04/05/2017 PCP: Alfonse Spruce, FNP   Brief Narrative:  Jenna Hill 47 year old with history of bipolar disorder, schizophrenia, hypertension who presented to the hospital on 04/05/2017 obtunded with hypothermia and tachycardia.  Narcan was given which did not help.   I saw her from 3/10 through 3/12 when she was in the SDU.   She stated that she was started on Abilify 15 mg and took the first dose the night before coming to the ER. Her roommate later found her unresponsive. She had not been sick with any other illness and no other changes in medications were made.  Patient was then intubated for airway protection & vomited during intubation.  She received 4 days of Unasyn for possible aspiration    Patient was given aggressive IV hydration for renal injury but Cr continued to worsen and was suspected to be ATN in relation to severe rhabdomyolysis.  Nephrology was consulted. On 3/8 she started on dialysis and also was able to be extubated.     She also had severe right leg swelling which I felt was the cause of her rhabdomyolysis on admission and suspected she had been laying on the leg when unresponsive. She had CT of the hip prior to my eval which showed some edema versus infection  Patient did develop some abdominal pain, CTs scan showed concern for pancreatitis.  She is noted to have gallstones on ultrasound however no evidence of cholecystitis.  Patient will eventually need GI and surgical outpatient follow-ups.   Subjective: No acute problems reported today.   Assessment & Plan:   Principal Problem:   AKI (acute kidney injury)  - suspected to be ATN from Rhabdo- as mentioned, dialysis started on 3/8 -  initially oliguric  - last dialyzed on 3/20- nephrology is following for further dialysis needs - Cr going up- per I and O in Epic, had 625 cc of urine yesterday  - 3/25- Cr stable today- may have  peaked - had ~ 1 L urine output documented for yesterday  Active Problems:  Tachycarida/ HTN - HCTZ and Amlodipine used at home are on hold - she has remained tachycardic since admission- Metoprolol started by nephrology on 3/23 (increased dose in house) - TSH was normal - ECHO > EF 50-55 % and grade 1 dCHF    Schizoaffective disorder  - recommendations by psych on 3/15: "Continue home psychotropic medications as prescribed for schizoaffective disorder-  cont Clozaril until she reaches home dose" - Clozaril is at home dose and Trintellix, Seroquel and Doxepin which she used at home are being continued at home dose     Acute metabolic encephalopathy   Acute respiratory failure     Lactic acidosis/ hypothermia - occurring early on in the admission and all have resolved  - CT head negative, APAP and Salicylates negative- UDS negative - encephalopathy resolved- initial thought was she may have overdosed but later told us that she did not- cause still uncertain - she may have aspirated while unresponsive and was treated with Unasyn by ICU team    Right leg swelling - Rhabdomyolysis/ Elevated LFTs - as mentioned above, suspect she was laying on right leg with caused her rhabdo/ swelling  - Elevated LFTS may also been from rhabdo vs shock liver  -abdominal Ultrasound > cholelithiasis-  -Hepatitis B negative -CT abdomen showed possible pancreatitis.  Lipase was negative.  Patient denied abdominal pain. - leg remains  mildly swollen- she currently has a Fentanyl patch, Tylenol TID and PRN Ultram ordered  Vomiting- intermittent - still able to eat most of her meals  - on Protonix- no cause found yet- ? Due to narcotics or uremia  Leukocytosis - currently suspecting stress reaction.  AOCD - currently stable Hb- follow  Hyperphosphatemia  -currently on phoslo TID   Deconditioned - CIR consulted- they are following her progress- with worsening Cr, she is not ready for d/c yet  DVT  prophylaxis: heparin Code Status: Full code Family Communication: case being discussed with patient Disposition Plan: once cleared for d/c by nephrology Consultants:   Nephrology  Psych  PCCM  -Ortho Procedures:   Intubation/ Extubation  Dialysis cath  04/10/17- - Bilateral LE venous duplex- no DVT- technically limited exam  2D ECHO - 04/06/17 -   mild LVH. Systolic function was normal- 55% to 60%.   Wall motion was normal; there were no regional wall motion   abnormalities- grade 1 diastolic dysfunction I   Antimicrobials:  Anti-infectives (From admission, onward)   Start     Dose/Rate Route Frequency Ordered Stop   04/13/17 2000  ceFAZolin (ANCEF) IVPB 1 g/50 mL premix     1 g 100 mL/hr over 30 Minutes Intravenous Every 24 hours 04/13/17 1401 04/19/17 2053   04/09/17 2000  Ampicillin-Sulbactam (UNASYN) 3 g in sodium chloride 0.9 % 100 mL IVPB  Status:  Discontinued     3 g 200 mL/hr over 30 Minutes Intravenous Every 24 hours 04/09/17 1119 04/11/17 1134   04/08/17 0600  Ampicillin-Sulbactam (UNASYN) 3 g in sodium chloride 0.9 % 100 mL IVPB  Status:  Discontinued     3 g 200 mL/hr over 30 Minutes Intravenous Every 24 hours 04/07/17 0719 04/09/17 1119   04/06/17 1800  Ampicillin-Sulbactam (UNASYN) 3 g in sodium chloride 0.9 % 100 mL IVPB  Status:  Discontinued     3 g 200 mL/hr over 30 Minutes Intravenous Every 12 hours 04/06/17 0942 04/07/17 0719   04/05/17 1800  Ampicillin-Sulbactam (UNASYN) 3 g in sodium chloride 0.9 % 100 mL IVPB  Status:  Discontinued     3 g 200 mL/hr over 30 Minutes Intravenous Every 6 hours 04/05/17 1620 04/06/17 0942   04/05/17 1630  Ampicillin-Sulbactam (UNASYN) 3 g in sodium chloride 0.9 % 100 mL IVPB     3 g 200 mL/hr over 30 Minutes Intravenous  Once 04/05/17 1608 04/05/17 1844       Objective: Vitals:   04/27/17 2134 04/28/17 0500 04/28/17 1057 04/28/17 1454  BP: (!) 167/91 115/78 (!) 157/97 (!) 154/89  Pulse: (!) 122 96 (!) 108 (!)  102  Resp: 15 20    Temp: 99.7 F (37.6 C) 98.7 F (37.1 C)    TempSrc: Oral Oral    SpO2: 95% 98%  97%  Weight:  119 kg (262 lb 5.6 oz)    Height:        Intake/Output Summary (Last 24 hours) at 04/28/2017 1623 Last data filed at 04/27/2017 2058 Gross per 24 hour  Intake -  Output 325 ml  Net -325 ml   Filed Weights   04/25/17 0425 04/27/17 0500 04/28/17 0500  Weight: 118 kg (260 lb 3.2 oz) 118.3 kg (260 lb 12.9 oz) 119 kg (262 lb 5.6 oz)    Examination: exam unchanged when compared to prior 3/27 General exam: Appears comfortable, in nad. HEENT: PERRLA, oral mucosa moist, no sclera icterus or thrush Respiratory system: Clear to auscultation. Respiratory  effort normal. Equal chest rise.  Cardiovascular system: S1 & S2 heard, RRR.  Mild tachycardia Gastrointestinal system: Abdomen soft, non-tender, nondistended. Normal bowel sound. No organomegaly Central nervous system: Alert and oriented. No focal neurological deficits. Extremities: No cyanosis, clubbing - right leg swelling and tenderness noted  Skin: No rashes or ulcers Psychiatry:  Flat affect  Data Reviewed: I have personally reviewed following labs and imaging studies  CBC: Recent Labs  Lab 04/22/17 0403 04/23/17 1741 04/24/17 0404 04/25/17 0402  WBC 14.0* 12.7* 13.1* 11.9*  HGB 10.0* 10.3* 9.8* 9.9*  HCT 31.1* 31.4* 30.9* 30.9*  MCV 90.9 91.5 91.2 90.9  PLT 275 300 285 010   Basic Metabolic Panel: Recent Labs  Lab 04/24/17 0404 04/25/17 0402 04/26/17 0446 04/27/17 0458 04/28/17 0439  NA 136 135 137 138 139  K 4.7 4.4 4.5 4.0 3.7  CL 99* 96* 100* 100* 100*  CO2 25 23 24 25 26   GLUCOSE 96 93 90 93 88  BUN 42* 50* 55* 55* 51*  CREATININE 10.93* 11.44* 11.38* 10.62* 9.72*  CALCIUM 9.1 9.4 9.5 9.5 9.2  PHOS 6.5* 7.1* 6.9* 6.4* 6.2*   GFR: Estimated Creatinine Clearance: 9.6 mL/min (A) (by C-G formula based on SCr of 9.72 mg/dL (H)). Liver Function Tests: Recent Labs  Lab 04/24/17 0404  04/25/17 0402 04/26/17 0446 04/27/17 0458 04/28/17 0439  ALBUMIN 2.2* 2.2* 2.3* 2.3* 2.0*   No results for input(s): LIPASE, AMYLASE in the last 168 hours. No results for input(s): AMMONIA in the last 168 hours. Coagulation Profile: No results for input(s): INR, PROTIME in the last 168 hours. Cardiac Enzymes: No results for input(s): CKTOTAL, CKMB, CKMBINDEX, TROPONINI in the last 168 hours. BNP (last 3 results) No results for input(s): PROBNP in the last 8760 hours. HbA1C: No results for input(s): HGBA1C in the last 72 hours. CBG: No results for input(s): GLUCAP in the last 168 hours. Lipid Profile: No results for input(s): CHOL, HDL, LDLCALC, TRIG, CHOLHDL, LDLDIRECT in the last 72 hours. Thyroid Function Tests: No results for input(s): TSH, T4TOTAL, FREET4, T3FREE, THYROIDAB in the last 72 hours. Anemia Panel: No results for input(s): VITAMINB12, FOLATE, FERRITIN, TIBC, IRON, RETICCTPCT in the last 72 hours. Urine analysis:    Component Value Date/Time   COLORURINE BROWN (A) 04/11/2017 1814   APPEARANCEUR TURBID (A) 04/11/2017 1814   LABSPEC 1.020 04/11/2017 1814   PHURINE 7.0 04/11/2017 1814   GLUCOSEU NEGATIVE 04/11/2017 1814   HGBUR LARGE (A) 04/11/2017 1814   BILIRUBINUR SMALL (A) 04/11/2017 1814   KETONESUR NEGATIVE 04/11/2017 1814   PROTEINUR 100 (A) 04/11/2017 1814   UROBILINOGEN 0.2 08/02/2008 0812   NITRITE POSITIVE (A) 04/11/2017 1814   LEUKOCYTESUR TRACE (A) 04/11/2017 1814   Sepsis Labs: @LABRCNTIP (procalcitonin:4,lacticidven:4) )No results found for this or any previous visit (from the past 240 hour(s)).   Radiology Studies: No results found.  Scheduled Meds: . acetaminophen  650 mg Oral TID  . calcium acetate  1,334 mg Oral TID WC  . Chlorhexidine Gluconate Cloth  6 each Topical Q0600  . cloZAPine  400 mg Oral QHS  . darbepoetin (ARANESP) injection - NON-DIALYSIS  100 mcg Subcutaneous Q Fri-1800  . diclofenac sodium  4 g Topical QID  . doxepin   50 mg Oral QHS  . fentaNYL  25 mcg Transdermal Q72H  . heparin injection (subcutaneous)  5,000 Units Subcutaneous Q8H  . hydrALAZINE  5 mg Intravenous Once  . LORazepam  1 mg Intravenous Once  . metoprolol tartrate  75  mg Oral BID  . pantoprazole  40 mg Oral BID AC  . QUEtiapine  400 mg Oral QHS  . vortioxetine HBr  20 mg Oral QHS   Continuous Infusions: . sodium chloride    . sodium chloride       LOS: 23 days    Time spent in minutes: 30 min  Velvet Bathe, MD [facility] Pager: [email or web address] Password [facility] 04/28/2017, 4:23 PM

## 2017-04-29 ENCOUNTER — Inpatient Hospital Stay (HOSPITAL_COMMUNITY)
Admission: RE | Admit: 2017-04-29 | Discharge: 2017-05-12 | DRG: 565 | Disposition: A | Discharge status: Home / Self Care | Payer: Medicaid Other | Source: Intra-hospital | Attending: Physical Medicine & Rehabilitation | Admitting: Physical Medicine & Rehabilitation

## 2017-04-29 ENCOUNTER — Encounter (HOSPITAL_COMMUNITY): Payer: Self-pay

## 2017-04-29 ENCOUNTER — Other Ambulatory Visit: Payer: Self-pay

## 2017-04-29 DIAGNOSIS — F251 Schizoaffective disorder, depressive type: Secondary | ICD-10-CM | POA: Diagnosis present

## 2017-04-29 DIAGNOSIS — T796XXS Traumatic ischemia of muscle, sequela: Secondary | ICD-10-CM | POA: Diagnosis not present

## 2017-04-29 DIAGNOSIS — R208 Other disturbances of skin sensation: Secondary | ICD-10-CM | POA: Diagnosis present

## 2017-04-29 DIAGNOSIS — E876 Hypokalemia: Secondary | ICD-10-CM | POA: Diagnosis present

## 2017-04-29 DIAGNOSIS — G4700 Insomnia, unspecified: Secondary | ICD-10-CM | POA: Diagnosis present

## 2017-04-29 DIAGNOSIS — M6282 Rhabdomyolysis: Secondary | ICD-10-CM | POA: Diagnosis present

## 2017-04-29 DIAGNOSIS — F319 Bipolar disorder, unspecified: Secondary | ICD-10-CM | POA: Diagnosis present

## 2017-04-29 DIAGNOSIS — I10 Essential (primary) hypertension: Secondary | ICD-10-CM | POA: Diagnosis present

## 2017-04-29 DIAGNOSIS — S7401XA Injury of sciatic nerve at hip and thigh level, right leg, initial encounter: Secondary | ICD-10-CM

## 2017-04-29 DIAGNOSIS — N179 Acute kidney failure, unspecified: Secondary | ICD-10-CM | POA: Diagnosis present

## 2017-04-29 DIAGNOSIS — S7401XS Injury of sciatic nerve at hip and thigh level, right leg, sequela: Secondary | ICD-10-CM | POA: Diagnosis not present

## 2017-04-29 DIAGNOSIS — T796XXD Traumatic ischemia of muscle, subsequent encounter: Secondary | ICD-10-CM | POA: Diagnosis not present

## 2017-04-29 DIAGNOSIS — M21371 Foot drop, right foot: Secondary | ICD-10-CM | POA: Diagnosis present

## 2017-04-29 DIAGNOSIS — R945 Abnormal results of liver function studies: Secondary | ICD-10-CM | POA: Diagnosis present

## 2017-04-29 DIAGNOSIS — G9340 Encephalopathy, unspecified: Secondary | ICD-10-CM | POA: Diagnosis present

## 2017-04-29 DIAGNOSIS — R5381 Other malaise: Secondary | ICD-10-CM | POA: Diagnosis present

## 2017-04-29 DIAGNOSIS — D649 Anemia, unspecified: Secondary | ICD-10-CM | POA: Diagnosis present

## 2017-04-29 DIAGNOSIS — F250 Schizoaffective disorder, bipolar type: Secondary | ICD-10-CM | POA: Diagnosis present

## 2017-04-29 DIAGNOSIS — S7401XD Injury of sciatic nerve at hip and thigh level, right leg, subsequent encounter: Secondary | ICD-10-CM | POA: Diagnosis not present

## 2017-04-29 HISTORY — DX: Failed or difficult intubation, initial encounter: T88.4XXA

## 2017-04-29 HISTORY — DX: Nausea with vomiting, unspecified: R11.2

## 2017-04-29 HISTORY — DX: Other reaction to spinal and lumbar puncture: G97.1

## 2017-04-29 HISTORY — DX: Other specified postprocedural states: Z98.890

## 2017-04-29 LAB — CBC
HEMATOCRIT: 30.8 % — AB (ref 36.0–46.0)
Hemoglobin: 9.8 g/dL — ABNORMAL LOW (ref 12.0–15.0)
MCH: 29.3 pg (ref 26.0–34.0)
MCHC: 31.8 g/dL (ref 30.0–36.0)
MCV: 91.9 fL (ref 78.0–100.0)
Platelets: 331 10*3/uL (ref 150–400)
RBC: 3.35 MIL/uL — AB (ref 3.87–5.11)
RDW: 14.7 % (ref 11.5–15.5)
WBC: 8.8 10*3/uL (ref 4.0–10.5)

## 2017-04-29 LAB — RENAL FUNCTION PANEL
ANION GAP: 13 (ref 5–15)
Albumin: 2.2 g/dL — ABNORMAL LOW (ref 3.5–5.0)
Albumin: 2.4 g/dL — ABNORMAL LOW (ref 3.5–5.0)
Anion gap: 12 (ref 5–15)
BUN: 50 mg/dL — ABNORMAL HIGH (ref 6–20)
BUN: 46 mg/dL — ABNORMAL HIGH (ref 6–20)
CALCIUM: 9 mg/dL (ref 8.9–10.3)
CHLORIDE: 101 mmol/L (ref 101–111)
CO2: 25 mmol/L (ref 22–32)
CO2: 26 mmol/L (ref 22–32)
CREATININE: 7.56 mg/dL — AB (ref 0.44–1.00)
Calcium: 9.4 mg/dL (ref 8.9–10.3)
Chloride: 101 mmol/L (ref 101–111)
Creatinine, Ser: 8.55 mg/dL — ABNORMAL HIGH (ref 0.44–1.00)
GFR calc Af Amer: 6 mL/min — ABNORMAL LOW (ref >60)
GFR, EST AFRICAN AMERICAN: 7 mL/min — AB (ref >60)
GFR, EST NON AFRICAN AMERICAN: 5 mL/min — AB (ref >60)
GFR, EST NON AFRICAN AMERICAN: 6 mL/min — AB (ref >60)
Glucose, Bld: 88 mg/dL (ref 65–99)
Glucose, Bld: 88 mg/dL (ref 65–99)
POTASSIUM: 3.3 mmol/L — AB (ref 3.5–5.1)
Phosphorus: 5.7 mg/dL — ABNORMAL HIGH (ref 2.5–4.6)
Phosphorus: 4.7 mg/dL — ABNORMAL HIGH (ref 2.5–4.6)
Potassium: 3.5 mmol/L (ref 3.5–5.1)
SODIUM: 139 mmol/L (ref 135–145)
Sodium: 139 mmol/L (ref 135–145)

## 2017-04-29 MED ORDER — AMLODIPINE BESYLATE 5 MG PO TABS
5.0000 mg | ORAL_TABLET | Freq: Every day | ORAL | Status: DC
  Administered 2017-04-29 – 2017-05-03 (×5): 5 mg via ORAL
  Filled 2017-04-29 (×5): qty 1

## 2017-04-29 MED ORDER — GUAIFENESIN-DM 100-10 MG/5ML PO SYRP: 5.0000 mL | ORAL_SOLUTION | Freq: Four times a day (QID) | ORAL | Status: DC | PRN

## 2017-04-29 MED ORDER — DARBEPOETIN ALFA 100 MCG/0.5ML IJ SOSY
100.0000 ug | PREFILLED_SYRINGE | INTRAMUSCULAR | Status: DC
Start: 2017-04-29 — End: 2017-05-12
  Administered 2017-04-29: 100 ug via SUBCUTANEOUS
  Filled 2017-04-29 (×3): qty 0.5

## 2017-04-29 MED ORDER — CHLORHEXIDINE GLUCONATE CLOTH 2 % EX PADS
6.0000 | MEDICATED_PAD | Freq: Every day | CUTANEOUS | Status: DC
Start: 2017-04-30 — End: 2017-05-04
  Administered 2017-04-30 – 2017-05-03 (×3): 6 via TOPICAL

## 2017-04-29 MED ORDER — DIPHENHYDRAMINE HCL 12.5 MG/5ML PO ELIX: 12.5000 mg | ORAL_SOLUTION | Freq: Four times a day (QID) | ORAL | Status: DC | PRN

## 2017-04-29 MED ORDER — TRAMADOL HCL 50 MG PO TABS
100.0000 mg | ORAL_TABLET | Freq: Two times a day (BID) | ORAL | Status: DC | PRN
Start: 2017-04-29 — End: 2017-05-12
  Administered 2017-04-29 – 2017-05-12 (×16): 100 mg via ORAL
  Filled 2017-04-29 (×17): qty 2

## 2017-04-29 MED ORDER — PROCHLORPERAZINE MALEATE 5 MG PO TABS
5.0000 mg | ORAL_TABLET | Freq: Four times a day (QID) | ORAL | Status: DC | PRN
  Administered 2017-04-30 – 2017-05-01 (×2): 5 mg via ORAL
  Administered 2017-05-01 – 2017-05-03 (×5): 10 mg via ORAL
  Administered 2017-05-04: 5 mg via ORAL
  Administered 2017-05-04: 10 mg via ORAL
  Filled 2017-04-29 (×2): qty 1
  Filled 2017-04-29 (×7): qty 2

## 2017-04-29 MED ORDER — PANTOPRAZOLE SODIUM 40 MG PO TBEC
40.0000 mg | DELAYED_RELEASE_TABLET | Freq: Two times a day (BID) | ORAL | Status: DC
  Administered 2017-04-29 – 2017-05-12 (×26): 40 mg via ORAL
  Filled 2017-04-29 (×26): qty 1

## 2017-04-29 MED ORDER — TRAZODONE HCL 50 MG PO TABS: 25.0000 mg | ORAL_TABLET | Freq: Every evening | ORAL | Status: DC | PRN

## 2017-04-29 MED ORDER — POLYETHYLENE GLYCOL 3350 17 G PO PACK
17.0000 g | PACK | Freq: Every day | ORAL | Status: DC | PRN
  Administered 2017-05-01: 17 g via ORAL
  Filled 2017-04-29: qty 1

## 2017-04-29 MED ORDER — GABAPENTIN 100 MG PO CAPS
100.0000 mg | ORAL_CAPSULE | Freq: Every day | ORAL | Status: DC
  Administered 2017-04-29 – 2017-05-01 (×3): 100 mg via ORAL
  Filled 2017-04-29 (×3): qty 1

## 2017-04-29 MED ORDER — HEPARIN SODIUM (PORCINE) 5000 UNIT/ML IJ SOLN
5000.0000 [IU] | Freq: Three times a day (TID) | INTRAMUSCULAR | Status: DC
  Administered 2017-04-29 – 2017-05-12 (×38): 5000 [IU] via SUBCUTANEOUS
  Filled 2017-04-29 (×38): qty 1

## 2017-04-29 MED ORDER — BISACODYL 10 MG RE SUPP
10.0000 mg | Freq: Every day | RECTAL | Status: DC | PRN
  Administered 2017-05-02: 10 mg via RECTAL
  Filled 2017-04-29: qty 1

## 2017-04-29 MED ORDER — FENTANYL 25 MCG/HR TD PT72
25.0000 ug | MEDICATED_PATCH | TRANSDERMAL | Status: DC
  Administered 2017-04-30 – 2017-05-12 (×5): 25 ug via TRANSDERMAL
  Filled 2017-04-29 (×5): qty 1

## 2017-04-29 MED ORDER — PROCHLORPERAZINE EDISYLATE 5 MG/ML IJ SOLN: 5.0000 mg | Freq: Four times a day (QID) | INTRAMUSCULAR | Status: DC | PRN

## 2017-04-29 MED ORDER — METOPROLOL TARTRATE 50 MG PO TABS
75.0000 mg | ORAL_TABLET | Freq: Two times a day (BID) | ORAL | Status: DC
  Administered 2017-04-29 – 2017-05-02 (×6): 75 mg via ORAL
  Filled 2017-04-29 (×6): qty 1

## 2017-04-29 MED ORDER — VORTIOXETINE HBR 20 MG PO TABS
20.0000 mg | ORAL_TABLET | Freq: Every day | ORAL | Status: DC
  Administered 2017-04-29 – 2017-05-11 (×13): 20 mg via ORAL
  Filled 2017-04-29 (×13): qty 1

## 2017-04-29 MED ORDER — CALCIUM ACETATE (PHOS BINDER) 667 MG PO CAPS
1334.0000 mg | ORAL_CAPSULE | Freq: Three times a day (TID) | ORAL | Status: DC
  Administered 2017-04-29 – 2017-05-12 (×38): 1334 mg via ORAL
  Filled 2017-04-29 (×39): qty 2

## 2017-04-29 MED ORDER — PROCHLORPERAZINE 25 MG RE SUPP: 12.5000 mg | Freq: Four times a day (QID) | RECTAL | Status: DC | PRN

## 2017-04-29 MED ORDER — ACETAMINOPHEN 325 MG PO TABS: 650.0000 mg | ORAL_TABLET | Freq: Four times a day (QID) | ORAL | Status: DC | PRN

## 2017-04-29 MED ORDER — DOXEPIN HCL 50 MG PO CAPS
50.0000 mg | ORAL_CAPSULE | Freq: Every day | ORAL | Status: DC
  Administered 2017-04-29 – 2017-05-11 (×13): 50 mg via ORAL
  Filled 2017-04-29 (×13): qty 1

## 2017-04-29 MED ORDER — FLEET ENEMA 7-19 GM/118ML RE ENEM
1.0000 | ENEMA | Freq: Once | RECTAL | Status: DC | PRN
  Filled 2017-04-29: qty 1

## 2017-04-29 MED ORDER — CLOZAPINE 100 MG PO TABS
400.0000 mg | ORAL_TABLET | Freq: Every day | ORAL | Status: DC
  Administered 2017-04-29 – 2017-05-11 (×13): 400 mg via ORAL
  Filled 2017-04-29 (×13): qty 4

## 2017-04-29 MED ORDER — METOPROLOL TARTRATE 75 MG PO TABS: 75.0000 mg | ORAL_TABLET | Freq: Two times a day (BID) | ORAL | Status: DC

## 2017-04-29 MED ORDER — SODIUM CHLORIDE 0.9% FLUSH
10.0000 mL | INTRAVENOUS | Status: DC | PRN
  Administered 2017-05-03: 20 mL
  Filled 2017-04-29: qty 40

## 2017-04-29 MED ORDER — QUETIAPINE FUMARATE ER 400 MG PO TB24
400.0000 mg | ORAL_TABLET | Freq: Every day | ORAL | Status: DC
  Administered 2017-04-29 – 2017-05-11 (×13): 400 mg via ORAL
  Filled 2017-04-29 (×13): qty 1

## 2017-04-29 MED ORDER — CALCIUM ACETATE (PHOS BINDER) 667 MG PO CAPS: 1334.0000 mg | ORAL_CAPSULE | Freq: Three times a day (TID) | ORAL | Status: DC

## 2017-04-29 MED ORDER — ALUM & MAG HYDROXIDE-SIMETH 200-200-20 MG/5ML PO SUSP: 30.0000 mL | ORAL | Status: DC | PRN

## 2017-04-29 MED ORDER — SODIUM CHLORIDE 0.9% FLUSH: 10.0000 mL | Freq: Two times a day (BID) | INTRAVENOUS | Status: DC

## 2017-04-29 MED ORDER — DICLOFENAC SODIUM 1 % TD GEL
4.0000 g | Freq: Four times a day (QID) | TRANSDERMAL | Status: DC
Start: 2017-04-29 — End: 2017-05-12
  Administered 2017-04-29 – 2017-05-11 (×13): 4 g via TOPICAL
  Filled 2017-04-29: qty 100

## 2017-04-29 MED ORDER — ACETAMINOPHEN 325 MG PO TABS
650.0000 mg | ORAL_TABLET | Freq: Three times a day (TID) | ORAL | Status: DC
  Administered 2017-04-29 – 2017-05-12 (×38): 650 mg via ORAL
  Filled 2017-04-29 (×38): qty 2

## 2017-04-29 NOTE — Progress Notes (Addendum)
NCM and CSW @ bedside to discuss discharge plan. Pt confirms lives alone and has no one to assist with care once d/c to home. States mom/brother lives in [place]. Pt still hoping to d/c to CIR. NCM explained to pt she can't lay in bed all day and expect to qualify for CIR. Pt stated she knows and if therapy doesn't work with her she gets minimal assistance from staff. NCM explained she has to motivate self, get up OOB to the chair, ask for help with ambulating, sit @ sink and bathe self. Pt states I want to help myself. Before leaving room NCM assisted pt with getting up OOB. Pt walked in hallway and finished with bathing self with some assistance. NCM encouraged pt to persist when needing help and to fully  work with therapy. Whitman Hero RN,BSN,CM

## 2017-04-29 NOTE — Progress Notes (Signed)
Cristina Gong, RN  Rehab Admission Coordinator  Physical Medicine and Rehabilitation  PMR Pre-admission  Signed  Date of Service:  04/19/2017 3:03 PM       Related encounter: ED to Hosp-Admission (Current) from 04/05/2017 in [facility]      Signed           [] Hide copied text  [] Hover for details   PMR Admission Coordinator Pre-Admission Assessment  Patient: Jenna Hill is an 47 y.o., female MRN: [id number] DOB: [birth year] Height: 5' 6.5" (168.9 cm) Weight: 123.7 kg (272 lb 12.8 oz)                                                                                                                                                  Insurance Information  PRIMARY: Medicaid [place] Access      Policy#: [id number]      Subscriber: Jenna Hill Benefits:  Phone #: [phone]     Name: 04/19/2017 Eff. Date: active 04/19/2017 CIR: 100%        Medicaid Application Date:       Case Manager:  Disability Application Date:       Case Worker:  Disabled due to psych issues   Emergency Contact Information        Contact Information    Name Relation Home Work Mobile   [place] Mother [phone]  [phone]   [place] Form [phone]     Copes, Langley Gauss Relative   [phone]     Current Medical History  Patient Admitting Diagnosis: debility secondary to rhabdo/encephalopathy  History of Present Illness:  [id number] [name] a 47 y.o.femalewith history of bipolar disorder, schizophrenia, HTN , history of overdose/suicide attempts who was admitted after being found down on 04/05/17. Patient was obtunded, with dried vomitus on cheeks, hypothermic, tachycardic and had AKI. UDS negative and encephalopathy felt to be due to suicide attempt. She was intubated for airway protection and started on IV Unasyn for aspiration PNA and IVF for AKI with rhabdomyolysis. She continued to have worsening of renal status requiring HD which began  on 04/08/17. She continues to have volume overload with oliguria. . She tolerated extubation on 3/8.   She has been on hemodialysis with some hope for renal function recovery. She has been having leukocytosis of unclear etiology. Receiving Unasyn for presumed cellulitis of RLE. CT of hip showed edema vs infection. Patient was evaluated by ortho and there was no concern for compartment syndrome. Felt due to muscle ischemia likely from lying on it for some time before found down. Recommended elevation along with compression with thigh high TED's or ace wrap. Also use a CAM boot when ambulating to give her ankle some stability. She could also wear at all times or get a separate PR AFO for non ambulatory periods to prevent foot drop.   She has had nausea  presumed related to uremia. CT of abdomen with some concern for pancreatitis, but Jenna Hill denies abdominal pain. Jenna Hill has gallstone by [place], no evidence of cholecystitis. Out Jenna Hill follow up with GI and or Surgery.  Psych consulted and saw Jenna Hill on 04/15/2017 related to medication overdose. Patient was hearing voices and for med management,ent issues. Discussed with Dr. Elmore Guise; resume clozapine in incremental doses. Patient does not need inpatient psychiatric admission. Restarted on Abilify IM and doxepin.  Pain management needed with RLE pain and Jenna Hill on fentanyl prn IV. Patient with allergies and intolerance to tramadol, percocet, Vicodin, and need to avoid NSAID due to renal failure. Transitioning to po pain meds on 04/20/17.  Nephrology following labs with last Hemodialysis 04/21/17. Creatinine has felt to have plateau with good urine output now. Hopeful to remove temporary dialysis catheter on Monday. NO indications for dialysis at this time.  Past Medical History      Past Medical History:  Diagnosis Date  . Bipolar 1 disorder ([place])   . Hypertension   . Schizophrenia ([place])     Family History  family history includes Healthy in her father, mother, and  sister.  Prior Rehab/Hospitalizations:  Has the patient had major surgery during 100 days prior to admission? No  Current Medications   Current Facility-Administered Medications:  .  0.9 %  sodium chloride infusion, , Intravenous, Once, Bodenheimer, Charles A, NP .  0.9 %  sodium chloride infusion, , Intravenous, Once, Regalado, Belkys A, MD .  acetaminophen (TYLENOL) tablet 650 mg, 650 mg, Oral, Q6H PRN, Ollis, Brandi L, NP, 650 mg at 04/19/17 0531 .  acetaminophen (TYLENOL) tablet 650 mg, 650 mg, Oral, TID, Regalado, Belkys A, MD, 650 mg at 04/20/17 0911 .  amLODipine (NORVASC) tablet 5 mg, 5 mg, Oral, Daily, Mannam, Praveen, MD, 5 mg at 04/19/17 0957 .  calcium acetate (PHOSLO) capsule 667 mg, 667 mg, Oral, TID WC, Corliss Parish, MD, 667 mg at 04/20/17 1229 .  Chlorhexidine Gluconate Cloth 2 % PADS 6 each, 6 each, Topical, Q0600, Mannam, Praveen, MD, 6 each at 04/18/17 0436 .  [COMPLETED] cloZAPine (CLOZARIL) tablet 50 mg, 50 mg, Oral, QHS, 50 mg at 04/15/17 2225 **FOLLOWED BY** [COMPLETED] cloZAPine (CLOZARIL) tablet 100 mg, 100 mg, Oral, QHS, 100 mg at 04/16/17 2141 **FOLLOWED BY** [COMPLETED] cloZAPine (CLOZARIL) tablet 200 mg, 200 mg, Oral, QHS, 200 mg at 04/17/17 2104 **FOLLOWED BY** [COMPLETED] cloZAPine (CLOZARIL) tablet 300 mg, 300 mg, Oral, QHS, 300 mg at 04/18/17 2116 **FOLLOWED BY** cloZAPine (CLOZARIL) tablet 400 mg, 400 mg, Oral, QHS, Regalado, Belkys A, MD, 400 mg at 04/19/17 2023 .  Darbepoetin Alfa (ARANESP) injection 100 mcg, 100 mcg, Intravenous, Q Fri-HD, Corliss Parish, MD .  diclofenac sodium (VOLTAREN) 1 % transdermal gel 4 g, 4 g, Topical, QID, Rizwan, Saima, MD, 4 g at 04/20/17 1230 .  doxepin (SINEQUAN) capsule 50 mg, 50 mg, Oral, QHS, Faythe Dingwall, DO, 50 mg at 04/19/17 2024 .  fentaNYL (DURAGESIC - dosed mcg/hr) 25 mcg, 25 mcg, Transdermal, Q48H, Mikhail, Maryann, DO, 25 mcg at 04/20/17 1328 .  heparin injection 5,000 Units, 5,000 Units,  Subcutaneous, Q8H, Ollis, Brandi L, NP, 5,000 Units at 04/20/17 0618 .  hydrALAZINE (APRESOLINE) injection 10 mg, 10 mg, Intravenous, Q4H PRN, Kirby-Graham, Karsten Fells, NP, 10 mg at 04/16/17 2214 .  LORazepam (ATIVAN) injection 1 mg, 1 mg, Intravenous, Once, Corliss Parish, MD .  ondansetron [facility]) injection 4 mg, 4 mg, Intravenous, Q6H PRN, Ollis, Brandi L, NP, 4 mg at 04/20/17  6789 .  QUEtiapine (SEROQUEL XR) 24 hr tablet 400 mg, 400 mg, Oral, QHS, Rizwan, Saima, MD, 400 mg at 04/19/17 2023 .  ranitidine (ZANTAC) 150 MG/10ML syrup 150 mg, 150 mg, Oral, QHS, Regalado, Belkys A, MD .  traMADol (ULTRAM) tablet 100 mg, 100 mg, Oral, Q12H PRN, Mikhail, Velta Addison, DO .  vortioxetine HBr (TRINTELLIX) tablet 20 mg, 20 mg, Oral, QHS, Deterding, Guadelupe Sabin, MD, 20 mg at 04/19/17 2024  Patients Current Diet: Diet renal with fluid restriction Fluid restriction: 1200 mL Fluid; Room service appropriate? Yes; Fluid consistency: Thin  Precautions / Restrictions Precautions Precautions: Fall Precaution Comments: No pf/df at R Foot. 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 None of the above  Prior Functional Level Prior Function Level of Independence: Independent Comments: drove and ran her own errands, but didn't work due to her schizophrenia.  Self Care: Did the patient need help bathing, dressing, using the toilet or eating?  Independent  Indoor Mobility: Did the patient need assistance with walking  from room to room (with or without device)? Independent  Stairs: Did the patient need assistance with internal or external stairs (with or without device)? Independent  Functional Cognition: Did the patient need help planning regular tasks such as shopping or remembering to take medications? Independent  Current Functional Level Cognition  Overall Cognitive Status: Within Functional Limits for tasks assessed Orientation Level: Oriented X4 General Comments: low speech volume    Extremity Assessment (includes Sensation/Coordination)  Upper Extremity Assessment: Generalized weakness  Lower Extremity Assessment: Defer to Jenna Hill evaluation RLE Deficits / Details: grossly 4- to 4/5 except pf/df 0 to trace/5 RLE Coordination: decreased fine motor LLE Deficits / Details: WFL grossly 4/5    ADLs  Overall ADL's : Needs assistance/impaired Toilet Transfer: Moderate assistance, Stand-pivot, BSC, RW Toilet Transfer Details (indicate cue type and reason): simulated with EOB>recliner. assist to steady and control descent. General ADL Comments: Jenna Hill completed bed mobility and SPT to recliner. Educated on UB ROM/light strengthening exercises.     Mobility  Overal bed mobility: Needs Assistance Bed Mobility: Supine to Sit Rolling: Min assist Sidelying to sit: Min assist Supine to sit: Min assist, HOB elevated General bed mobility comments: minA for management of R LE to floor, vc for use of R UE to reach across to bedrail to pull up into sitting    Transfers  Overall transfer level: Needs assistance Equipment used: Rolling walker (2 wheeled) Transfers: Sit to/from Stand Sit to Stand: Mod assist Stand pivot transfers: Min assist General transfer comment: mod A to power up into standing and to steady.     Ambulation / Gait / Stairs / Wheelchair Mobility  Ambulation/Gait Ambulation/Gait assistance: +2 safety/equipment, Min assist Ambulation Distance (Feet): 10 Feet Assistive device:  Rolling walker (2 wheeled) Gait Pattern/deviations: Decreased stance time - right, Decreased step length - left General Gait Details: minA for steadying with RW, increased stance time on R LE facilitated by CAM walker, Jenna Hill fatigues quickly, requiring close chair follow for safety Gait velocity: decreased Gait velocity interpretation: Below normal speed for  age/gender    Posture / Balance Dynamic Sitting Balance Sitting balance - Comments: able to move out of midline/BOS, but guarded Balance Overall balance assessment: Needs assistance Sitting-balance support: No upper extremity supported, Feet supported Sitting balance-Leahy Scale: Fair Sitting balance - Comments: able to move out of midline/BOS, but guarded Standing balance support: Bilateral upper extremity supported Standing balance-Leahy Scale: Poor Standing balance comment: reliant on the RW and external support due to R foot instability    Special needs/care consideration BiPAP/CPAP  N/a CPM  N/a Continuous Drip IV  N/a Dialysis AKI with last dialysis 3/21. Hopeful to remove dialysis catheter on Monday Life Vest  N/a Oxygen  N/a Special Bed  N/a Trach Size  N/a Wound Vac n/a Skin abrasion right thigh with blister; moisture associated skin damage to perineum and thighs Bowel mgmt: continent LBM  04/27/2017 Bladder mgmt: continent; now non-oliguric; need accurate intake and output measurements Diabetic mgmt  N/a Encouragement needed for patient to remain out of bed during the day for full participation in [place]: Alone  Lives With: Alone Available Help at Discharge: Friend(s), Available PRN/intermittently Type of Home: Apartment Home Layout: One level Home Access: Stairs to enter Entrance Stairs-Rails: None Technical brewer of Steps: 3 Bathroom Shower/Tub: Chiropodist: Standard Bathroom Accessibility: Yes How Accessible: Accessible via  walker Home Care Services: No Additional Comments: friend works 7 pm until 7 am and can check in on her  Discharge Living Setting Plans for Discharge Living Setting: Apartment, Alone Type of Home at Discharge: Apartment Discharge Home Layout: One level Discharge Home Access: Stairs to enter Entrance Stairs-Rails: None Entrance Stairs-Number of Steps: 3 Discharge Bathroom Shower/Tub: Tub/shower unit, Curtain Discharge Bathroom Toilet: Standard Discharge Bathroom Accessibility: Yes How Accessible: Accessible via walker Does the patient have any problems obtaining your medications?: No  Social/Family/Support Systems Contact Information: Mom and local friend, Mom in [place] Anticipated Caregiver: friends prn Anticipated Caregiver's Contact Information: see above Ability/Limitations of Caregiver: friends work so intermittent assist only Caregiver Availability: Intermittent Discharge Plan Discussed with Primary Caregiver: Yes Is Caregiver In Agreement with Plan?: Yes Does Caregiver/Family have Issues with Lodging/Transportation while Jenna Hill is in Rehab?: No  Goals/Additional Needs Patient/Family Goal for Rehab: Mod I wiht Jenna Hill, OT, and SLP Expected length of stay: ELOS 10- 15 days Jenna Hill/Family Agrees to Admission and willing to participate: Yes Program Orientation Provided & Reviewed with Jenna Hill/Caregiver Including Roles  & Responsibilities: Yes  Barriers to Discharge: Decreased caregiver support  Psychiatry cleared her 04/15/2017 for not a suicide attempt.   Decrease burden of Care through IP rehab admission: n/a  Possible need for SNF placement upon discharge:not anticipated  Patient Condition: This patient's medical and functional status has changed since the consult dated: 04/12/2017 in which the Rehabilitation Physician determined and documented that the patient's condition is appropriate for intensive rehabilitative care in an inpatient rehabilitation facility. See "History of Present  Illness" (above) for medical update. Functional changes are: min assist. Patient's medical and functional status update has been discussed with the Rehabilitation physician and patient remains appropriate for inpatient rehabilitation. Will admit to inpatient rehab today.  Preadmission Screen Completed By:  Cleatrice Burke, 04/20/2017 2:05 PM ______________________________________________________________________   Discussed status with Dr. Naaman Plummer on 04/29/2017 at 1306 and received telephone approval for admission today.  Admission Coordinator:  Cleatrice Burke [id number] Sudie Grumbling 04/29/2017             Cosigned by: Meredith Staggers, MD at 04/29/2017  1:51 PM  Revision History

## 2017-04-29 NOTE — Progress Notes (Signed)
Jenna Hill to be D/C'd Rehab (CIR) per MD order.  Discussed with the patient and all questions fully answered.  VSS, Skin clean, dry and intact without evidence of skin break down, no evidence of skin tears noted.  Report called to Chrys Racer, RN at [facility]. All questions answered.  Patient escorted via WC to CIR.   [place] 04/29/2017 4:58 PM

## 2017-04-29 NOTE — Progress Notes (Signed)
Dr. Wendee Beavers has confirmed d/c to cir today. I will make the arrangements. [phone]

## 2017-04-29 NOTE — Progress Notes (Signed)
  [facility] Progress Note   This is a very pleasant lady with bipolar disorder, schizophrenia, hypertension who presented to the hospital on 04/05/2017 obtunded with hypothermia and tachycardia.  It appears that she has had severe rhabdomyolysis and started dialysis 3/8    Assessment/ Plan:   1 Acute renal failure- found down, AKI due to ATN from severe rhabdo- SP HD beginning3/8.Has Temp cath still (last HD 3/21). UOP  picking up. Will monitor and no dialysis today - Continued improvement in renal function.  - Plan to pull catheter on Monday if trend continues. 2 Severe depression/ schizoaffective disorder - on meds 3 s/pResp failure -  5 HTN - Amlodipine 5 and metoprolol 50 mg    Subjective:   No indications for dialysis at this point  Right leg pain.  Denies f/c/n/v/dyspnea   Objective:   BP (!) 159/105 (BP Location: Right Arm)   Pulse (!) 101   Temp 98.9 F (37.2 C) (Oral)   Resp (!) 23   Ht 5' 6.5" (1.689 m)   Wt 116.4 kg (256 lb 11.2 oz)   SpO2 97%   BMI 40.81 kg/m   Intake/Output Summary (Last 24 hours) at 04/29/2017 1451 Last data filed at 04/29/2017 1419 Gross per 24 hour  Intake 360 ml  Output 1900 ml  Net -1540 ml   Weight change: -2.562 kg (-5 lb 10.4 oz)  Physical Exam: GEN - NAD A&Ox3 CVS- RRR RS- CTA ABD- BS present soft non-distended EXT- edemain rt leg > lt leg     Imaging: No results found.  Labs: BMET Recent Labs  Lab 04/23/17 0400 04/24/17 0404 04/25/17 0402 04/26/17 0446 04/27/17 0458 04/28/17 0439 04/29/17 0442  NA 134* 136 135 137 138 139 139  K 4.0 4.7 4.4 4.5 4.0 3.7 3.5  CL 97* 99* 96* 100* 100* 100* 101  CO2 24 25 23 24 25 26 25   GLUCOSE 86 96 93 90 93 88 88  BUN 33* 42* 50* 55* 55* 51* 50*  CREATININE 9.81* 10.93* 11.44* 11.38* 10.62* 9.72* 8.55*  CALCIUM 8.8* 9.1 9.4 9.5 9.5 9.2 9.0  PHOS 6.3* 6.5* 7.1* 6.9* 6.4* 6.2* 5.7*   CBC Recent Labs  Lab 04/23/17 1741 04/24/17 0404  04/25/17 0402  WBC 12.7* 13.1* 11.9*  HGB 10.3* 9.8* 9.9*  HCT 31.4* 30.9* 30.9*  MCV 91.5 91.2 90.9  PLT 300 285 290    Medications:    . acetaminophen  650 mg Oral TID  . calcium acetate  1,334 mg Oral TID WC  . Chlorhexidine Gluconate Cloth  6 each Topical Q0600  . cloZAPine  400 mg Oral QHS  . darbepoetin (ARANESP) injection - NON-DIALYSIS  100 mcg Subcutaneous Q Fri-1800  . diclofenac sodium  4 g Topical QID  . doxepin  50 mg Oral QHS  . fentaNYL  25 mcg Transdermal Q72H  . heparin injection (subcutaneous)  5,000 Units Subcutaneous Q8H  . hydrALAZINE  5 mg Intravenous Once  . LORazepam  1 mg Intravenous Once  . metoprolol tartrate  75 mg Oral BID  . pantoprazole  40 mg Oral BID AC  . QUEtiapine  400 mg Oral QHS  . vortioxetine HBr  20 mg Oral QHS      Otelia Santee, MD 04/29/2017, 2:51 PM

## 2017-04-29 NOTE — NC FL2 (Signed)
[place] MEDICAID FL2 LEVEL OF CARE SCREENING TOOL     IDENTIFICATION  Patient Name: Jenna Hill Birthdate: [birth year] Sex: female Admission Date (Current Location): 04/05/2017  [facility] and [place] Number:  Herbalist and Address:  [facility], [place] [address] [place], [place] [address]      Provider Number: [id number]  Attending Physician Name and Address:  Velvet Bathe, MD  Relative Name and Phone Number:       Current Level of Care: Hospital Recommended Level of Care: [place] Prior Approval Number:    Date Approved/Denied:   PASRR Number:    Discharge Plan: SNF    Current Diagnoses: Patient Active Problem List   Diagnosis Date Noted  . AKI (acute kidney injury) ([place]) 04/25/2017  . Shock liver 04/10/2017  . Rhabdomyolysis 04/10/2017  . Bipolar 1 disorder ([place]) 04/10/2017  . Acute respiratory failure ([place])   . Acute encephalopathy 04/05/2017  . 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 OSA (obstructive sleep apnea) 04/16/2015  . Obesity 04/16/2015  . Schizoaffective disorder, depressive type ([place]) 04/15/2015  . Hypertension 10/05/2012    Orientation RESPIRATION BLADDER Height & Weight     Self, Time, Situation, Place  Normal Continent Weight: 116.4 kg (256 lb 11.2 oz) Height:  5' 6.5" (168.9 cm)  BEHAVIORAL SYMPTOMS/MOOD NEUROLOGICAL BOWEL NUTRITION STATUS      Continent Diet(Please see DC Summary)  AMBULATORY STATUS COMMUNICATION OF NEEDS Skin   Limited Assist Verbally Normal                       Personal Care Assistance Level of Assistance  Bathing, Feeding, Dressing Bathing Assistance: Limited assistance Feeding assistance: Independent Dressing Assistance: Limited assistance     Functional Limitations Info             SPECIAL CARE FACTORS FREQUENCY  PT (By licensed PT)     PT Frequency: 3x/week              Contractures      Additional Factors Info  Code Status,  Allergies, Psychotropic Code Status Info: Full Allergies Info: Bee Venom, Codeine, Other, Toradol Ketorolac Tromethamine, Lortab Hydrocodone-acetaminophen, Percocet Oxycodone-acetaminophen, Ibuprofen, Latex Psychotropic Info: Trintillex, Seroquel         Current Medications (04/29/2017):  This is the current hospital active medication list Current Facility-Administered Medications  Medication Dose Route Frequency Provider Last Rate Last Dose  . 0.9 %  sodium chloride infusion   Intravenous Once Bodenheimer, Charles A, NP      . 0.9 %  sodium chloride infusion   Intravenous Once Regalado, Belkys A, MD      . acetaminophen (TYLENOL) tablet 650 mg  650 mg Oral Q6H PRN Ollis, Brandi L, NP   650 mg at 04/29/17 0814  . acetaminophen (TYLENOL) tablet 650 mg  650 mg Oral TID Regalado, Belkys A, MD   650 mg at 04/28/17 2158  . calcium acetate (PHOSLO) capsule 1,334 mg  1,334 mg Oral TID WC Mikhail, Velta Addison, DO   1,334 mg at 04/29/17 9892  . Chlorhexidine Gluconate Cloth 2 % PADS 6 each  6 each Topical Q0600 Marshell Garfinkel, MD   6 each at 04/29/17 0610  . cloZAPine (CLOZARIL) tablet 400 mg  400 mg Oral QHS Regalado, Belkys A, MD   400 mg at 04/28/17 2159  . Darbepoetin Alfa (ARANESP) injection 100 mcg  100 mcg Subcutaneous Q [id number]-4174 Corliss Parish, MD   100 mcg at 04/22/17  1730  . diclofenac sodium (VOLTAREN) 1 % transdermal gel 4 g  4 g Topical QID Debbe Odea, MD   4 g at 04/29/17 1115  . doxepin (SINEQUAN) capsule 50 mg  50 mg Oral QHS Faythe Dingwall, DO   50 mg at 04/28/17 2159  . fentaNYL (DURAGESIC - dosed mcg/hr) 25 mcg  25 mcg Transdermal Q72H Mikhail, [place], DO   25 mcg at 04/27/17 1441  . heparin injection 5,000 Units  5,000 Units Subcutaneous Q8H Ollis, Brandi L, NP   5,000 Units at 04/29/17 0651  . hydrALAZINE (APRESOLINE) injection 5 mg  5 mg Intravenous Once Velvet Bathe, MD      . LORazepam (ATIVAN) injection 1 mg  1 mg Intravenous Once Corliss Parish, MD      .  metoprolol tartrate (LOPRESSOR) tablet 75 mg  75 mg Oral BID Velvet Bathe, MD   75 mg at 04/29/17 1114  . ondansetron (ZOFRAN) injection 4 mg  4 mg Intravenous Q6H PRN Ollis, Brandi L, NP   4 mg at 04/29/17 1007  . pantoprazole (PROTONIX) EC tablet 40 mg  40 mg Oral BID AC Mikhail, [place], DO   40 mg at 04/29/17 7902  . QUEtiapine (SEROQUEL XR) 24 hr tablet 400 mg  400 mg Oral QHS Debbe Odea, MD   400 mg at 04/28/17 2159  . traMADol (ULTRAM) tablet 100 mg  100 mg Oral Q12H PRN Cristal Ford, DO   100 mg at 04/29/17 4097  . vortioxetine HBr (TRINTELLIX) tablet 20 mg  20 mg Oral QHS Deterding, Guadelupe Sabin, MD   20 mg at 04/28/17 2159     Discharge Medications: Please see discharge summary for a list of discharge medications.  Relevant Imaging Results:  Relevant Lab Results:   Additional Information SSN: [place], [place]

## 2017-04-29 NOTE — Discharge Summary (Addendum)
Physician Discharge Summary  Jenna Hill RCV:[id number] DOB: 06-25-1970 DOA: 04/05/2017  PCP: Alfonse Spruce, FNP  Admit date: 04/05/2017 Discharge date: 04/29/2017  Time spent:  35 minutes  Recommendations for Outpatient Follow-up:  1. Ensure patient continues follow up with nephrology   Discharge Diagnoses:  Principal Problem:   AKI (acute kidney injury) ([place]) Active Problems:   Hypertension   Schizoaffective disorder, depressive type ([place])   Acute encephalopathy   Acute respiratory failure ([place])   Shock liver   Rhabdomyolysis   Bipolar 1 disorder ([place])   Discharge Condition: stable  Diet recommendation: Renal diet  Filed Weights   04/27/17 0500 04/28/17 0500 04/29/17 0610  Weight: 118.3 kg (260 lb 12.9 oz) 119 kg (262 lb 5.6 oz) 116.4 kg (256 lb 11.2 oz)    History of present illness:  47 y/o female with pmh significant for schizophrenia, HTN, bipolar d/o and attempted suicide admitted with AMS and hypothermia, suspected intentional OD of psych medication.  Treating for aspiration and sepsis.  Hospital Course:   AKI (acute kidney injury)  - suspected to be ATN from [facility]- as mentioned, dialysis started on 3/8 -  initially oliguric  - last dialyzed on 3/20- nephrology is following for further dialysis needs - Cr going up- per I and O in Epic, had 625 cc of urine yesterday  - 3/25- Cr stable today- may have peaked - had ~ 1 L urine output documented for yesterday  Active Problems:  Tachycarida/ HTN - HCTZ and Amlodipine used at home are on hold - she has remained tachycardic since admission- Metoprolol started by nephrology on 3/23 (increased dose in house) - TSH was normal - ECHO > EF 50-55 % and grade 1 dCHF    Schizoaffective disorder  - recommendations by psych on 3/15: "Continue home psychotropic medications as prescribed for schizoaffective disorder-  cont Clozaril until she reaches home dose" - Clozaril is at home dose and Trintellix, Seroquel and  Doxepin which she used at home are being continued at home dose     Acute metabolic encephalopathy   Acute respiratory failure     Lactic acidosis/ hypothermia - occurring early on in the admission and all have resolved  - CT head negative, APAP and Salicylates negative- UDS negative - encephalopathy resolved- initial thought was she may have overdosed but later told us that she did not- cause still uncertain - she may have aspirated while unresponsive and was treated with Unasyn by ICU team    Right leg swelling - Rhabdomyolysis/ Elevated LFTs - as mentioned above, suspect she was laying on right leg with caused her rhabdo/ swelling  - Elevated LFTS may also been from rhabdo vs shock liver  -abdominal Ultrasound > cholelithiasis-  -Hepatitis B negative -CT abdomen showed possible pancreatitis. Lipase was negative. Patient denied abdominal pain.  Vomiting- intermittent - resolved  Leukocytosis - currently suspecting stress reaction.  AOCD - currently stable Hb- follow  Hyperphosphatemia  -nephrology managed while in house.  Deconditioned - CIR   Procedures:  none  Consultations:  PM & R  Discharge Exam: Vitals:   04/29/17 1114 04/29/17 1421  BP: (!) 143/87 (!) 150/86  Pulse: (!) 118 100  Resp:  19  Temp:  97.8 F (36.6 C)  SpO2:  100%    General: Pt in nad, alert and awake Cardiovascular: rrr, no rubs Respiratory: no increased wob, no wheezes  Discharge Instructions   Discharge Instructions    Call MD for:  extreme fatigue   Complete  by:  As directed    Call MD for:  temperature >100.4   Complete by:  As directed    Diet - low sodium heart healthy   Complete by:  As directed    Discharge instructions   Complete by:  As directed    Please ensure f/u with nephrology   Increase activity slowly   Complete by:  As directed      Allergies as of 04/29/2017      Reactions   Bee Venom Anaphylaxis   Codeine Swelling   Facial swelling   Other  Anaphylaxis   bertrillium or bertillium     Toradol [ketorolac Tromethamine] Swelling   Tongue swelling   Lortab [hydrocodone-acetaminophen] Hives   Percocet [oxycodone-acetaminophen] Hives   Ibuprofen    Raised hives Also with naproxen   Latex Rash      Medication List    STOP taking these medications   amLODipine 5 MG tablet Commonly known as:  NORVASC   aspirin EC 81 MG tablet   eszopiclone 3 MG Tabs Generic drug:  Eszopiclone   hydrochlorothiazide 12.5 MG tablet Commonly known as:  HYDRODIURIL     TAKE these medications   ABILIFY MAINTENA 400 MG Prsy prefilled syringe Generic drug:  ARIPiprazole ER Inject 400 mg into the skin every 30 (thirty) days. 12/18/15   acetaminophen 325 MG tablet Commonly known as:  TYLENOL Take 2 tablets (650 mg total) by mouth every 6 (six) hours as needed for moderate pain or headache. What changed:    medication strength  how much to take  reasons to take this   ALLERGY RELIEF 10 MG tablet Generic drug:  loratadine Take 10 mg by mouth daily.   calcium acetate 667 MG capsule Commonly known as:  PHOSLO Take 2 capsules (1,334 mg total) by mouth 3 (three) times daily with meals.   clozapine 200 MG tablet Commonly known as:  CLOZARIL Take 400 mg by mouth at bedtime.   doxepin 50 MG capsule Commonly known as:  SINEQUAN Take 50-100 mg by mouth at bedtime.   EPINEPHrine 0.3 mg/0.3 mL Soaj injection Commonly known as:  EPI-PEN Inject 0.3 mLs (0.3 mg total) into the muscle daily as needed (allergic reaction).   Metoprolol Tartrate 75 MG Tabs Take 75 mg by mouth 2 (two) times daily.   QUEtiapine 400 MG 24 hr tablet Commonly known as:  SEROQUEL XR Take 400 mg by mouth at bedtime.   TRINTELLIX 20 MG Tabs tablet Generic drug:  vortioxetine HBr Take 20 mg by mouth daily. With food      Allergies  Allergen Reactions  . Bee Venom Anaphylaxis  . Codeine Swelling    Facial swelling  . Other Anaphylaxis    bertrillium or  bertillium    . Toradol [Ketorolac Tromethamine] Swelling    Tongue swelling  . Lortab [Hydrocodone-Acetaminophen] Hives  . Percocet [Oxycodone-Acetaminophen] Hives  . Ibuprofen     Raised hives Also with naproxen  . Latex Rash      The results of significant diagnostics from this hospitalization (including imaging, microbiology, ancillary and laboratory) are listed below for reference.    Significant Diagnostic Studies: Ct Abdomen Pelvis Wo Contrast  Result Date: 04/13/2017 CLINICAL DATA:  47 year old female with nausea vomiting. EXAM: CT ABDOMEN AND PELVIS WITHOUT CONTRAST TECHNIQUE: Multidetector CT imaging of the abdomen and pelvis was performed following the standard protocol without IV contrast. COMPARISON:  Abdominal radiograph dated 04/12/2017 and ultrasound dated 04/07/2017 FINDINGS: Evaluation of this exam is limited  in the absence of intravenous contrast. Lower chest: Partially visualized small left pleural effusion. There is associated partial compressive atelectasis of the left lung base. No intra-abdominal free air.  No significant free fluid. Hepatobiliary: The liver is unremarkable as visualized. No intrahepatic biliary ductal dilatation. No calcified gallstone. Pancreas: There is inflammatory changes of the pancreas most consistent with acute pancreatitis. No drainable fluid collection/abscess or pseudocyst. Correlation with clinical exam and pancreatic enzymes recommended. Spleen: Normal in size without focal abnormality. Adrenals/Urinary Tract: The adrenal glands, kidneys, and visualized ureters appear unremarkable. The urinary bladder is decompressed around a Foley catheter. There is apparent diffuse thickening of the bladder wall which may be partly related to underdistention. Cystitis is not excluded. Correlation with urinalysis recommended. Stomach/Bowel: There is no bowel obstruction or active inflammation. Normal appendix. Vascular/Lymphatic: The abdominal aorta and IVC are  grossly unremarkable on this noncontrast CT. No portal venous gas. There is no adenopathy. Reproductive: The uterus is anteverted and grossly unremarkable. The ovaries are grossly unremarkable as visualized. Other: There is diffuse subcutaneous and abdominal wall stranding and edema as well as edema of the visualized upper right thigh. No drainable fluid collection. Musculoskeletal: No acute or significant osseous findings. IMPRESSION: 1. Acute pancreatitis.  No fluid collection or abscess. 2. Small left pleural effusion, and subcutaneous edema and anasarca. Electronically Signed   By: Anner Crete M.D.   On: 04/13/2017 23:09   Ct Head Wo Contrast  Result Date: 04/05/2017 CLINICAL DATA:  Altered level of consciousness. Overdose of unknown substance. Incontinence of urine upon arrival. EXAM: CT HEAD WITHOUT CONTRAST TECHNIQUE: Contiguous axial images were obtained from the base of the skull through the vertex without intravenous contrast. COMPARISON:  03/18/2012 CT FINDINGS: BRAIN: The ventricles and sulci are normal. No intraparenchymal hemorrhage, mass effect nor midline shift. No acute large vascular territory infarcts. Grey-white matter distinction is maintained. The basal ganglia are unremarkable. No abnormal extra-axial fluid collections. Basal cisterns are not effaced and midline. The brainstem and cerebellar hemispheres are without acute abnormalities. VASCULAR: Unremarkable. SKULL/SOFT TISSUES: No skull fracture. No significant soft tissue swelling. Parasagittal forehead scar is believed to account for the subtle soft tissue hyperdensity seen previously. ORBITS/SINUSES: The included ocular globes and orbital contents are normal.The mastoid air cells are clear. The included paranasal sinuses are well-aerated. OTHER: None. IMPRESSION: No acute intracranial abnormality. Electronically Signed   By: Ashley Royalty M.D.   On: 04/05/2017 16:54   US Renal  Result Date: 04/07/2017 CLINICAL DATA:  Acute renal  failure. EXAM: RENAL / URINARY TRACT ULTRASOUND COMPLETE COMPARISON:  None. FINDINGS: Right Kidney: Length: 10.9 cm. Echogenicity within normal limits. No mass or hydronephrosis visualized. Left Kidney: Length: 10.6 cm. Echogenicity within normal limits. No mass or hydronephrosis visualized. There is a 5 mm stone within the lower pole of the left kidney. Bladder: Decompressed. IMPRESSION: No hydronephrosis. 5 mm stone inferior pole left kidney. Electronically Signed   By: Lovey Newcomer M.D.   On: 04/07/2017 21:17   Ct Hip Right Wo Contrast  Result Date: 04/09/2017 CLINICAL DATA:  Right hip and thigh swelling after fall. EXAM: CT OF THE RIGHT HIP AND FEMUR WITHOUT CONTRAST TECHNIQUE: Multidetector CT imaging of the right hip was performed according to the standard protocol. Multiplanar CT image reconstructions were also generated. COMPARISON:  None. FINDINGS: Bones/Joint/Cartilage No acute fracture or dislocation. Joint spaces are preserved. Trace suprapatellar knee joint effusion. Bone mineralization is normal. Ligaments Suboptimally assessed by CT. Muscles and Tendons Grossly unremarkable.  No muscle atrophy.  Soft tissues Moderate subcutaneous edema and fat stranding about the right lateral hip and circumferentially through the thigh. Small amount of fascial fluid along the right vastus lateralis muscle. No drainable fluid collection. No hematoma. IMPRESSION: 1. Moderate subcutaneous edema and fat stranding about the right lateral hip and circumferentially through the thigh, nonspecific, but can be seen with cellulitis. No drainable fluid collection. 2.  No acute osseous abnormality. Electronically Signed   By: Titus Dubin M.D.   On: 04/09/2017 15:04   Ct Femur Right Wo Contrast  Result Date: 04/09/2017 CLINICAL DATA:  Right hip and thigh swelling after fall. EXAM: CT OF THE RIGHT HIP AND FEMUR WITHOUT CONTRAST TECHNIQUE: Multidetector CT imaging of the right hip was performed according to the standard  protocol. Multiplanar CT image reconstructions were also generated. COMPARISON:  None. FINDINGS: Bones/Joint/Cartilage No acute fracture or dislocation. Joint spaces are preserved. Trace suprapatellar knee joint effusion. Bone mineralization is normal. Ligaments Suboptimally assessed by CT. Muscles and Tendons Grossly unremarkable.  No muscle atrophy. Soft tissues Moderate subcutaneous edema and fat stranding about the right lateral hip and circumferentially through the thigh. Small amount of fascial fluid along the right vastus lateralis muscle. No drainable fluid collection. No hematoma. IMPRESSION: 1. Moderate subcutaneous edema and fat stranding about the right lateral hip and circumferentially through the thigh, nonspecific, but can be seen with cellulitis. No drainable fluid collection. 2.  No acute osseous abnormality. Electronically Signed   By: Titus Dubin M.D.   On: 04/09/2017 15:04   Dg Chest Port 1 View  Result Date: 04/08/2017 CLINICAL DATA:  Acute respiratory failure EXAM: PORTABLE CHEST 1 VIEW COMPARISON:  04/07/2017 FINDINGS: Endotracheal tube 2 cm above the carina. Left jugular central venous catheter tip in the right atrium unchanged. NG in the stomach. Left lower lobe airspace disease unchanged. Perihilar vascular congestion bilaterally. IMPRESSION: Pulmonary vascular congestion may reflect mild fluid overload. Left lower lobe consolidation unchanged. Electronically Signed   By: Franchot Gallo M.D.   On: 04/08/2017 07:49   Dg Chest Port 1 View  Result Date: 04/07/2017 CLINICAL DATA:  47 y/o  F; central line placement. EXAM: PORTABLE CHEST 1 VIEW COMPARISON:  04/07/2017 chest radiograph FINDINGS: Left central line with tip projecting over lower SVC. Endotracheal tube tip projects 1.6 cm above carina. Enteric tube tip below field of view and abdomen. Stable cardiac silhouette. Stable hazy opacities of the lungs. IMPRESSION: Left central venous catheter tip projects over lower SVC.  Otherwise stable lines and tubes. Stable hazy opacities of lungs. Electronically Signed   By: Kristine Garbe M.D.   On: 04/07/2017 14:39   Dg Chest Port 1 View  Result Date: 04/07/2017 CLINICAL DATA:  47 year old female with altered mental status. Possible overdose. Intubated. EXAM: PORTABLE CHEST 1 VIEW COMPARISON:  04/06/2017 and earlier. FINDINGS: Portable AP semi upright view at 0438 hours. The patient is more rotated to the right. Endotracheal tube tip is stable up the level the clavicles. Enteric tube courses to the abdomen, tip not included. More kyphotic view. Stable cardiac size and mediastinal contours. No pneumothorax. Pulmonary vascularity appears stable and within normal limits. Mild veiling opacity at the left lung base. No convincing confluent opacity otherwise. IMPRESSION: 1.  Stable lines and tubes. 2. More rotated, kyphotic view. Mild veiling opacity at the left lung base which may be artifact or small pleural effusion. Electronically Signed   By: Genevie Ann M.D.   On: 04/07/2017 09:30   Dg Chest Port 1 View  Result Date:  04/06/2017 CLINICAL DATA:  Respiratory insufficiency, question aspiration EXAM: PORTABLE CHEST 1 VIEW COMPARISON:  04/05/2017 FINDINGS: Support devices are stable. Heart is borderline in size. Mild vascular congestion. No confluent airspace opacities or effusions. No acute bony abnormality. IMPRESSION: Mild vascular congestion.  No overt edema. Electronically Signed   By: Rolm Baptise M.D.   On: 04/06/2017 08:30   Dg Chest Port 1 View  Result Date: 04/05/2017 CLINICAL DATA:  NG placement EXAM: PORTABLE CHEST 1 VIEW COMPARISON:  10/07/2012 FINDINGS: Endotracheal tube in good position.  NG tube in the stomach. Cardiac enlargement with vascular congestion. Mild perihilar airspace disease left greater than right likely due to mild edema. No effusion IMPRESSION: Endotracheal tube in good position.  NG tube in the stomach Congestive heart failure with mild edema left  greater than right. Electronically Signed   By: Franchot Gallo M.D.   On: 04/05/2017 14:45   Dg Abd Portable 1v  Result Date: 04/19/2017 CLINICAL DATA:  Abdominal pain. Vomiting and diarrhea. Abdominal tenderness. EXAM: PORTABLE ABDOMEN - 1 VIEW COMPARISON:  CT 04/13/2017 FINDINGS: No bowel dilatation to suggest obstruction. No evidence of free air on single supine view. Right lateral abdomen is excluded from the field of view. IMPRESSION: Nonobstructive bowel gas pattern. Right lateral abdomen excluded from the field of view. Electronically Signed   By: Jeb Levering M.D.   On: 04/19/2017 03:36   Dg Abd Portable 1v  Result Date: 04/12/2017 CLINICAL DATA:  Acute onset of nausea and vomiting. Generalized abdominal distention. EXAM: PORTABLE ABDOMEN - 1 VIEW COMPARISON:  Renal ultrasound performed 04/07/2017 FINDINGS: The visualized bowel gas pattern is unremarkable. Scattered air and stool filled loops of colon are seen; no abnormal dilatation of small bowel loops is seen to suggest small bowel obstruction. No free intra-abdominal air is identified, though evaluation for free air is limited on supine views. The visualized osseous structures are within normal limits; the sacroiliac joints are unremarkable in appearance. The visualized lung bases are essentially clear. IMPRESSION: Unremarkable bowel gas pattern; no free intra-abdominal air seen. Electronically Signed   By: Garald Balding M.D.   On: 04/12/2017 03:40   US Abdomen Limited Ruq  Result Date: 04/18/2017 CLINICAL DATA:  Abnormal transaminase. EXAM: ULTRASOUND ABDOMEN LIMITED RIGHT UPPER QUADRANT COMPARISON:  CT scan of the abdomen dated 04/13/2017 FINDINGS: Gallbladder: Multiple gallstones. Gallbladder wall is not thickened. Negative sonographic Murphy's sign. Common bile duct: Diameter: 3.8 mm, normal. Liver: No focal lesion identified. Within normal limits in parenchymal echogenicity. Portal vein is patent on color Doppler imaging with normal  direction of blood flow towards the liver. IMPRESSION: 1. Cholelithiasis. 2. Incidental note made of right pleural effusion. Electronically Signed   By: Lorriane Shire M.D.   On: 04/18/2017 09:29    Microbiology: No results found for this or any previous visit (from the past 240 hour(s)).   Labs: Basic Metabolic Panel: Recent Labs  Lab 04/25/17 0402 04/26/17 0446 04/27/17 0458 04/28/17 0439 04/29/17 0442  NA 135 137 138 139 139  K 4.4 4.5 4.0 3.7 3.5  CL 96* 100* 100* 100* 101  CO2 23 24 25 26 25   GLUCOSE 93 90 93 88 88  BUN 50* 55* 55* 51* 50*  CREATININE 11.44* 11.38* 10.62* 9.72* 8.55*  CALCIUM 9.4 9.5 9.5 9.2 9.0  PHOS 7.1* 6.9* 6.4* 6.2* 5.7*   Liver Function Tests: Recent Labs  Lab 04/25/17 0402 04/26/17 0446 04/27/17 0458 04/28/17 0439 04/29/17 0442  ALBUMIN 2.2* 2.3* 2.3* 2.0* 2.2*  No results for input(s): LIPASE, AMYLASE in the last 168 hours. No results for input(s): AMMONIA in the last 168 hours. CBC: Recent Labs  Lab 04/23/17 1741 04/24/17 0404 04/25/17 0402  WBC 12.7* 13.1* 11.9*  HGB 10.3* 9.8* 9.9*  HCT 31.4* 30.9* 30.9*  MCV 91.5 91.2 90.9  PLT 300 285 290   Cardiac Enzymes: No results for input(s): CKTOTAL, CKMB, CKMBINDEX, TROPONINI in the last 168 hours. BNP: BNP (last 3 results) Recent Labs    04/05/17 1751  BNP 36.9    ProBNP (last 3 results) No results for input(s): PROBNP in the last 8760 hours.  CBG: No results for input(s): GLUCAP in the last 168 hours.    Signed:  Velvet Bathe MD.  [facility] 04/29/2017, 2:27 PM

## 2017-04-29 NOTE — Care Management Note (Signed)
Case Management Note  Patient Details  Name: Exie Chrismer MRN: [id number] Date of Birth: Jan 07, 1971  Subjective/Objective:    AKI. Hx of  bipolar disorder, schizophrenia, hypertension who presented to the hospital on 04/05/2017 obtunded with hypothermia and tachycardia.  Hyacinth Meeker (Mother) Melba Coon [facility] [phone]     PCP: Fredia Beets  Action/Plan: Transition to CIR today.  Expected Discharge Date:  04/29/17               Expected Discharge Plan:  [place]  In-House Referral:     Discharge planning Services  CM Consult   Status of Service:  Completed, signed off  If discussed at Long Length of Stay Meetings, dates discussed:    Additional Comments:  Sharin Mons, RN 04/29/2017, 3:48 PM

## 2017-04-29 NOTE — H&P (Signed)
Physical Medicine and Rehabilitation Admission H&P       Chief Complaint  Patient presents with  . Functional deficits due to rhabdomyolysis.    HPI:   Jenna Hill a 47 y.o.femalewith history of bipolar disorder, schizophrenia, HTN , history of overdose/sucide attempts who was admitted after being found down on 04/05/17. Patient was obtunded, with dried vomitus on cheeks, hypothermic, tachycardic and had AKI. UDS negative and encephalopathy felt to be due to suicide attempt. She was intubated for airway protection and started on IV unasyn for aspiration PNA and IVF for AKI with rhabdomyolysis. She required HD briefly due to worsening of renal status with volume overload with oliguria. She tolerated extubation on 3/8 with intermittent HD.  Urine output is improving and HD has been on hold since 3/20. SCr peaked at 11.3 and has started to trend downwards in the past few days. Anemia has been treated with transfusion, ferrlecit as well as aranesp.  Psychiatry consulted for input and felt that AMS likely due to polypharmacy and not SI with recommendations to titrated Clozaril to home dose.   She has had issues with nausea and CT abdomen/pelvis done revealing acute pancreatitis with diffuse subcutaneous and abdominal wall stranding and edema as well as edema of visualized upper thigh--no drainable fluid collection noted. As patient without abdominal pain and Lipase negative symptoms not felt to be due to pancreatitis. Abnormal LFTs felt to be due to shocked liver and rhabdomyolysis.  She continued to report numbness with dysesthesias right hemibody and has had 2-3 + edema RLE with weakness and foot drop.  Dr. Doreatha Martin was consulted due to concerns of compartment syndrome and felt that weakness and pain due to muscle damage from rhabdomyolysis. To use CAM boot/PRAFO to prevent foot drop and EMG/NCS in the future. Medical issues improving but BP remains labile. Patient noted to be limited by  RLE weakness as well as debility due to multiple medical issues. CIR recommended due to functional deficits.    Review of Systems  Constitutional: Negative for chills and fever.  HENT: Negative for hearing loss and tinnitus.   Eyes: Negative for blurred vision and double vision.  Respiratory: Positive for shortness of breath. Negative for cough.   Cardiovascular: Negative for chest pain and palpitations.  Gastrointestinal: Negative for constipation, heartburn and nausea.       Nausea with food  Genitourinary: Negative for dysuria and urgency.  Musculoskeletal: Negative for myalgias and neck pain.  Skin: Negative for itching and rash.  Neurological: Positive for dizziness, sensory change (From right collar bone down right side. ) and headaches.  Psychiatric/Behavioral: The patient has insomnia.         Past Medical History:  Diagnosis Date  . Bipolar 1 disorder ([place])   . Hypertension   . Schizophrenia [facility])          Past Surgical History:  Procedure Laterality Date  . ANKLE ARTHROSCOPY Right   . CESAREAN SECTION    . HAND SURGERY Right   . ORIF ANKLE FRACTURE Left 01/29/2016   Procedure: OPEN REDUCTION INTERNAL FIXATION LEFT ANKLE BIMALLEOLAR FRACTURE AND SYNDESMOSIS;  Surgeon: Wylene Simmer, MD;  Location: [facility];  Service: Orthopedics;  Laterality: Left;         Family History  Problem Relation Age of Onset  . Healthy Mother   . Healthy Father   . Healthy Sister     Social History:  Lives alone--has family in [place]. 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 Shereports that has never smoked. She has never used smokeless tobacco. She reports that shedoe notdrink alcohol. She reports that she does not use drugs.         Allergies  Allergen Reactions  . Bee Venom Anaphylaxis  . Codeine Swelling    Facial swelling  . Other Anaphylaxis    bertrillium or bertillium    .  Toradol [Ketorolac Tromethamine] Swelling    Tongue swelling  . Lortab [Hydrocodone-Acetaminophen] Hives  . Percocet [Oxycodone-Acetaminophen] Hives  . Ibuprofen     Raised hives Also with naproxen  . Latex Rash          Medications Prior to Admission  Medication Sig Dispense Refill  . ABILIFY MAINTENA 400 MG PRSY Inject 400 mg into the skin every 30 (thirty) days. 12/18/15  1  . clozapine (CLOZARIL) 200 MG tablet Take 400 mg by mouth at bedtime.     Marland Kitchen doxepin (SINEQUAN) 50 MG capsule Take 50-100 mg by mouth at bedtime.    Marland Kitchen EPINEPHrine 0.3 mg/0.3 mL IJ SOAJ injection Inject 0.3 mLs (0.3 mg total) into the muscle daily as needed (allergic reaction). 1 Device 0  . ESZOPICLONE 3 MG tablet Take 3 mg by mouth at bedtime.  0  . QUEtiapine (SEROQUEL XR) 400 MG 24 hr tablet Take 400 mg by mouth at bedtime.     . TRINTELLIX 20 MG TABS tablet Take 20 mg by mouth daily. With food  2  . acetaminophen (TYLENOL) 500 MG tablet Take 2 tablets (1,000 mg total) by mouth every 6 (six) hours as needed for mild pain or moderate pain. (Patient not taking: Reported on 04/06/2017) 60 tablet 0  . amLODipine (NORVASC) 5 MG tablet Take 1 tablet (5 mg total) by mouth daily. (Patient not taking: Reported on 04/06/2017) 30 tablet 2  . aspirin EC 81 MG tablet Take 1 tablet (81 mg total) by mouth 2 (two) times daily. (Patient not taking: Reported on 04/06/2017) 90 tablet 0  . hydrochlorothiazide (HYDRODIURIL) 12.5 MG tablet Take 2 tablets (25 mg total) by mouth daily. (Patient not taking: Reported on 04/06/2017) 30 tablet 2  . loratadine (ALLERGY RELIEF) 10 MG tablet Take 10 mg by mouth daily.      Drug Regimen Review  Drug regimen was reviewed and remains appropriate with no significant issues identified  Home: Home Living Family/patient expects to be discharged to:: Private residence Living Arrangements: Alone Available Help at Discharge: Friend(s), Available PRN/intermittently Type of Home:  Apartment Home Access: Stairs to enter [employer] of Steps: 3 Entrance Stairs-Rails: None Home Layout: One level Bathroom Shower/Tub: Chiropodist: Standard Bathroom Accessibility: Yes Home Equipment: None Additional Comments: friend works 7 pm until 7 am and can check in on her  Lives With: Alone   Functional History: Prior Function Level of Independence: Independent Comments: drove and ran her own errands, but didn't work due to her schizophrenia.  Functional Status:  Mobility: Bed Mobility Overal bed mobility: Needs Assistance Bed Mobility: Supine to Sit, Sit to Supine Rolling: Modified independent (Device/Increase time) Sidelying to sit: Min assist Supine to sit: Min assist, HOB elevated Sit to supine: Min assist, HOB elevated General bed mobility comments: Min A for R LE managment out of and into bed Transfers Overall transfer level: Needs assistance Equipment used: Rolling walker (2 wheeled) Transfers: Sit to/from Stand Sit to Stand: Min assist Stand pivot transfers: Min assist General transfer  comment: minA for stabilization on 1st transfer, min guard for 3 additional transfers Ambulation/Gait Ambulation/Gait assistance: Min assist Ambulation Distance (Feet): 12 Feet(4 x 12 feet) Assistive device: Rolling walker (2 wheeled) Gait Pattern/deviations: Decreased stance time - right, Decreased step length - left General Gait Details: Min A to steady, pt fatigues quickly, requiring chair to follow closely behind. Abe to ambulate 4x 12 feet  Gait velocity: decreased Gait velocity interpretation: Below normal speed for age/gender  ADL: ADL Overall ADL's : Needs assistance/impaired Toilet Transfer: Moderate assistance, Stand-pivot, BSC, RW Toilet Transfer Details (indicate cue type and reason): simulated with EOB>recliner. assist to steady and control descent. Toileting- Clothing Manipulation and Hygiene: Total assistance, Sit to/from  stand General ADL Comments: pt was able to don L sock; cam boot was already on R LE.  Assisted pt with hygiene and changing chux pad as she is on her cycle. Pt states she can usually perform hygiene, but she was unable to reach today.  Did not have much space to work with  Cognition: Cognition Overall Cognitive Status: Within Functional Limits for tasks assessed Orientation Level: Oriented X4 Cognition Arousal/Alertness: Awake/alert Behavior During Therapy: WFL for tasks assessed/performed Overall Cognitive Status: Within Functional Limits for tasks assessed General Comments: low speech volume   Blood pressure (!) 152/89, pulse (!) 111, temperature 99 F (37.2 C), temperature source Oral, resp. rate 18, height 5' 6.5" (1.689 m), weight 118 kg (260 lb 3.2 oz), SpO2 94 %. Physical Exam  Nursing note and vitals reviewed. Constitutional: She is oriented to person, place, and time. She appears well-developed and well-nourished. No distress.  HENT:  Head: Atraumatic.  Mouth/Throat: Oropharynx is clear and moist.  Eyes: Pupils are equal, round, and reactive to light. Conjunctivae and EOM are normal.  Neck: Normal range of motion. Neck supple.  Cardiovascular: Normal rate and regular rhythm.  No murmur heard. Respiratory: Effort normal and breath sounds normal. No stridor. No respiratory distress.  GI: Soft. Bowel sounds are normal. She exhibits no distension. There is no tenderness.  Musculoskeletal: She exhibits edema.  Moderate edema right hip -->thight-->foot. Able to activate RLE without pain now.   Neurological: She is alert and oriented to person, place, and time.  UE 5/5 prox to distal. LLE 4/5 prox to distal. RLE: 3 to 3+/5 HF, KE and 0/5 ADF/PF. Decreased sensation right leg which is more severe at foot. Fair insight and awareness.  Skin: Skin is warm and dry. She is not diaphoretic.  Psychiatric: She has a normal mood and affect. Her behavior is normal.  Fairly focused,  follows commands. Spoken in a foreign language to me at end of our visit    LabResultsLast48Hours  Results for orders placed or performed during the hospital encounter of 04/05/17 (from the past 48 hour(s))  CBC     Status: Abnormal   Collection Time: 04/23/17  5:41 PM  Result Value Ref Range   WBC 12.7 (H) 4.0 - 10.5 K/uL   RBC 3.43 (L) 3.87 - 5.11 MIL/uL   Hemoglobin 10.3 (L) 12.0 - 15.0 g/dL   HCT 31.4 (L) 36.0 - 46.0 %   MCV 91.5 78.0 - 100.0 fL   MCH 30.0 26.0 - 34.0 pg   MCHC 32.8 30.0 - 36.0 g/dL   RDW 15.0 11.5 - 15.5 %   Platelets 300 150 - 400 K/uL    Comment: Performed at [facility], [place] [address], [place], [place] [address]  Renal function panel     Status:  Abnormal   Collection Time: 04/24/17  4:04 AM  Result Value Ref Range   Sodium 136 135 - 145 mmol/L   Potassium 4.7 3.5 - 5.1 mmol/L   Chloride 99 (L) 101 - 111 mmol/L   CO2 25 22 - 32 mmol/L   Glucose, Bld 96 65 - 99 mg/dL   BUN 42 (H) 6 - 20 mg/dL   Creatinine, Ser 10.93 (H) 0.44 - 1.00 mg/dL   Calcium 9.1 8.9 - 10.3 mg/dL   Phosphorus 6.5 (H) 2.5 - 4.6 mg/dL   Albumin 2.2 (L) 3.5 - 5.0 g/dL   GFR calc non Af Amer 4 (L) >60 mL/min   GFR calc Af Amer 4 (L) >60 mL/min    Comment: (NOTE) The eGFR has been calculated using the CKD EPI equation. This calculation has not been validated in all clinical situations. eGFR's persistently <60 mL/min signify possible Chronic Kidney Disease.    Anion gap 12 5 - 15    Comment: Performed at [facility] [address] [place], [place] [address]  CBC     Status: Abnormal   Collection Time: 04/24/17  4:04 AM  Result Value Ref Range   WBC 13.1 (H) 4.0 - 10.5 K/uL   RBC 3.39 (L) 3.87 - 5.11 MIL/uL   Hemoglobin 9.8 (L) 12.0 - 15.0 g/dL   HCT 30.9 (L) 36.0 - 46.0 %   MCV 91.2 78.0 - 100.0 fL   MCH 28.9 26.0 - 34.0 pg   MCHC 31.7 30.0 - 36.0 g/dL   RDW 14.8 11.5 - 15.5 %   Platelets 285 150 - 400 K/uL     Comment: Performed at [facility], [place] [address], [place], [place] [address]  Renal function panel     Status: Abnormal   Collection Time: 04/25/17  4:02 AM  Result Value Ref Range   Sodium 135 135 - 145 mmol/L   Potassium 4.4 3.5 - 5.1 mmol/L   Chloride 96 (L) 101 - 111 mmol/L   CO2 23 22 - 32 mmol/L   Glucose, Bld 93 65 - 99 mg/dL   BUN 50 (H) 6 - 20 mg/dL   Creatinine, Ser 11.44 (H) 0.44 - 1.00 mg/dL   Calcium 9.4 8.9 - 10.3 mg/dL   Phosphorus 7.1 (H) 2.5 - 4.6 mg/dL   Albumin 2.2 (L) 3.5 - 5.0 g/dL   GFR calc non Af Amer 3 (L) >60 mL/min   GFR calc Af Amer 4 (L) >60 mL/min    Comment: (NOTE) The eGFR has been calculated using the CKD EPI equation. This calculation has not been validated in all clinical situations. eGFR's persistently <60 mL/min signify possible Chronic Kidney Disease.    Anion gap 16 (H) 5 - 15    Comment: Performed at [facility], [place] [address] [place], [place] [address]  CBC     Status: Abnormal   Collection Time: 04/25/17  4:02 AM  Result Value Ref Range   WBC 11.9 (H) 4.0 - 10.5 K/uL   RBC 3.40 (L) 3.87 - 5.11 MIL/uL   Hemoglobin 9.9 (L) 12.0 - 15.0 g/dL   HCT 30.9 (L) 36.0 - 46.0 %   MCV 90.9 78.0 - 100.0 fL   MCH 29.1 26.0 - 34.0 pg   MCHC 32.0 30.0 - 36.0 g/dL   RDW 14.8 11.5 - 15.5 %   Platelets 290 150 - 400 K/uL    Comment: Performed at [facility], [place] [address]  [place], [place] [address]     ImagingResults(Last48hours)  No results found.       Medical Problem List and Plan: 1.  Functional and mobility deficits secondary to rhabdomyolysis and encephalopathy. Pt with right foot drop and dysesthesias----?sciatic nerve injury due to prolonged down time?  Do not see consistent signs of RUE sensory loss.             -admit to inpatient rehab             -may need AFO  2.  DVT Prophylaxis/Anticoagulation: Pharmaceutical: Heparin 3. Pain Management: Fentanyl patch with  tramadol prn             -add gabapentin for dysesthetic pain 4. Mood: LCSW to follow for evaluation and support.  5. Neuropsych: This patient is capable of making decisions on her  own behalf. 6. Skin/Wound Care: Pressure relief measures.  7. Fluids/Electrolytes/Nutrition: Strict I/O with daily weights.  8. Rhabdomyolysis with acute renal failure: Continue to monitor UOP as well as labs for recovery. SCr trending downwards 11.3-->10.6-->9.7-->8.55  9. Leucocytosis: Resolving. Monitor for signs of infection. 10. Anemia: Will monitor with serial checks. H/H stable in 9-10 range.  11. Abnormal LFTs: Recheck in am. 12. Schizoaffective/depressive disorder: Continue Clozaril, Sinequan, Seroquel and Trintellix at bedtime.  13. HTN: Poorly controlled. Will monitor BP bid. Currently on Metoprolol bid. Will add amlodipine per renal notes.    Post Admission Physician Evaluation: Functional deficits secondary  to rhabdomyolysis/encephalopathy/right foot drop  1. Patient is admitted to receive collaborative, interdisciplinary care between the physiatrist, rehab nursing staff, and therapy team. 2. Patient's level of medical complexity and substantial therapy needs in context of that medical necessity cannot be provided at a lesser intensity of care such as a SNF. 3. Patient has experienced substantial functional loss from his/her baseline which was documented above under the "Functional History" and "Functional Status" headings.  Judging by the patient's diagnosis, physical exam, and functional history, the patient has potential for functional progress which will result in measurable gains while on inpatient rehab.  These gains will be of substantial and practical use upon discharge  in facilitating mobility and self-care at the household level. 4. Physiatrist will provide 24 hour management of medical needs as well as oversight of the therapy plan/treatment and provide guidance as appropriate regarding  the interaction of the two. 5. The Preadmission Screening has been reviewed and patient status is unchanged unless otherwise stated above. 6. 24 hour rehab nursing will assist with bladder management, bowel management, safety, skin/wound care, disease management, medication administration, pain management and patient education  and help integrate therapy concepts, techniques,education, etc. 7. PT will assess and treat for/with: Lower extremity strength, range of motion, stamina, balance, functional mobility, safety, adaptive techniques and equipment, NMR, pain mgt, orthotics.   Goals are: mod I. 8. OT will assess and treat for/with: ADL's, functional mobility, safety, upper extremity strength, adaptive techniques and equipment, NMR, pain mgt, ego support.   Goals are: mod I. Therapy may proceed with showering this patient. 9. SLP will assess and treat for/with: cognition, communication.  Goals are: mod I. 10. Case Management and Social Worker will assess and treat for psychological issues and discharge planning. 11. Team conference will be held weekly to assess progress toward goals and to determine barriers to discharge. 12. Patient will receive at least 3 hours of therapy per day at least 5 days per week. 13. ELOS: 11-15 days       14. Prognosis:  excellent  Meredith Staggers, MD, [facility] 04/29/2017  Bary Leriche, PA-C 04/25/2017

## 2017-04-29 NOTE — Progress Notes (Signed)
I met with pt at bedside to discuss her participation with therapies today. I have reviewed case with Dr. Swartz and he is in agreement to admit pt to CIR today. I contacted Dr. Vega who will review her case to assess if pt felt medically ready to d/c today. I have requested call back from Dr. Vega to confirm possible admit today ASAP. [phone] 

## 2017-04-29 NOTE — Progress Notes (Signed)
Jenna Staggers, MD      Jenna Staggers, MD  Physician  Physical Medicine and Rehabilitation      Consult Note  Signed     Date of Service:  04/12/2017  3:31 PM         Related encounter: ED to Hosp-Admission (Current) from 04/05/2017 in [facility]             Signed          Expand All Collapse All            Expand widget buttonCollapse widget button     Hide copied text   Hover for detailscustomization button                                                                                                            untitled image              Physical Medicine and Rehabilitation Consult        Reason for Consult: Functional deficits.   Referring Physician: Dr. Wynelle Cleveland        HPI: Jenna Hill is a 47 y.o. female with history of bipolar disorder, schizophrenia, HTN , history of overdose/sucide attempts who was admitted after being found down on 04/05/17. Patient was obtunded, with dried vomitus on cheeks, hypothermic, tachycardic and had AKI. UDS negative and encephalopathy felt to be due to suicide attempt. She was intubated for airway protection and started on  IV unasyn for aspiration PNA and IVF for  AKI with rhabdomyolysis. She continued to have worsening of renal status requiring HD. She continues to have volume overload with oliguria. .  She tolerated extubation on 3/8 and therapy evaluations done yesterday revealing gait disorder.  Psych medications resumed and evalution pending.  CIR recommended for follow up therapy.         Review of Systems   Constitutional: Negative for chills and fever.   HENT: Negative for hearing loss.    Eyes: Negative for blurred vision and double vision.   Respiratory: Negative for shortness of breath.    Cardiovascular:  Positive for leg swelling. Negative for chest pain.   Gastrointestinal: Positive for abdominal pain and nausea.   Genitourinary:        Foley in place   Musculoskeletal: Positive for back pain and myalgias.   Neurological: Positive for sensory change (numbness from abdomen down to RLE> LLE), focal weakness (RLE), weakness and headaches.   Psychiatric/Behavioral: Positive for hallucinations and memory loss. The patient is nervous/anxious.                  Past Medical History:    Diagnosis   Date    .   Bipolar 1 disorder ([place])        .   Hypertension        .   Schizophrenia ([place])  Past Surgical History:    Procedure   Laterality   Date    .   ANKLE ARTHROSCOPY   Right        .   CESAREAN SECTION            .   HAND SURGERY   Right        .   ORIF ANKLE FRACTURE   Left   01/29/2016        Procedure: OPEN REDUCTION INTERNAL FIXATION LEFT ANKLE BIMALLEOLAR FRACTURE AND SYNDESMOSIS;  Surgeon: Wylene Simmer, MD;  Location: [facility];  Service: Orthopedics;  Laterality: Left;                Family History    Problem   Relation   Age of Onset    .   Healthy   Mother        .   Healthy   Father        .   Healthy   Sister              Social History: Lives alone--has family in [place]. 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 She reports that  has never smoked.  She has never used smokeless tobacco. She reports that she doe not drink alcohol. She reports that she does not use drugs.               Allergies    Allergen   Reactions    .   Bee Venom   Anaphylaxis    .   Bretylium                Other reaction(s): Seizures    .   Codeine   Anaphylaxis    .   Other   Anaphylaxis            bertrillium or bertillium      .   Toradol  [Ketorolac Tromethamine]   Swelling    .   Ibuprofen                Raised hives       .   Latex   Rash              Medications Prior to Admission    Medication   Sig   Dispense   Refill    .   ABILIFY MAINTENA 400 MG PRSY   Inject 400 mg into the skin every 30 (thirty) days. 12/18/15       1    .   clozapine (CLOZARIL) 200 MG tablet   Take 400 mg by mouth at bedtime.             Marland Kitchen   doxepin (SINEQUAN) 50 MG capsule   Take 50-100 mg by mouth at bedtime.            Marland Kitchen   EPINEPHrine 0.3 mg/0.3 mL IJ SOAJ injection   Inject 0.3 mLs (0.3 mg total) into the muscle daily as needed (allergic reaction).   1 Device   0    .   ESZOPICLONE 3 MG tablet   Take 3 mg by mouth at bedtime.       0    .   QUEtiapine (SEROQUEL XR) 400 MG 24 hr tablet   Take 400 mg by mouth at bedtime.             Marland Kitchen  TRINTELLIX 20 MG TABS tablet   Take 20 mg by mouth daily. With food       2    .   acetaminophen (TYLENOL) 500 MG tablet   Take 2 tablets (1,000 mg total) by mouth every 6 (six) hours as needed for mild pain or moderate pain. (Patient not taking: Reported on 04/06/2017)   60 tablet   0    .   amLODipine (NORVASC) 5 MG tablet   Take 1 tablet (5 mg total) by mouth daily. (Patient not taking: Reported on 04/06/2017)   30 tablet   2    .   aspirin EC 81 MG tablet   Take 1 tablet (81 mg total) by mouth 2 (two) times daily. (Patient not taking: Reported on 04/06/2017)   90 tablet   0    .   hydrochlorothiazide (HYDRODIURIL) 12.5 MG tablet   Take 2 tablets (25 mg total) by mouth daily. (Patient not taking: Reported on 04/06/2017)   30 tablet   2    .   loratadine (ALLERGY RELIEF) 10 MG tablet   Take 10 mg by mouth daily.                  Home:  Home Living  Family/patient expects to be discharged to:: Private residence  Living Arrangements: Other relatives  Available  Help at Discharge: Friend(s), Available PRN/intermittently(as needed if called)  Type of Home: Apartment  Home Access: Stairs to enter  [employer] of Steps: 3  Entrance Stairs-Rails: None  Home Layout: One level  Bathroom Shower/Tub: Administrator, Civil Service: Standard  Home Equipment: None   Functional History:  Prior Function  Level of Independence: Independent  Comments: drove and ran her own errands, but didn't work due to her schizophrenia.  Functional Status:   Mobility:  Bed Mobility  Overal bed mobility: Needs Assistance  Bed Mobility: Supine to Sit  Rolling: Min assist  Sidelying to sit: Min assist  Supine to sit: Min assist  General bed mobility comments: cues for sequence of task,  assist to help RLE off of the bed; minimal handheld assist LE and trunk  Transfers  Overall transfer level: Needs assistance  Equipment used: Rolling walker (2 wheeled)  Transfers: Sit to/from Stand  Sit to Stand: Mod assist  Stand pivot transfers: Mod assist  General transfer comment: Mod assist to power up and steady at initial stand  Ambulation/Gait  Ambulation/Gait assistance: Mod assist, +2 safety/equipment  Ambulation Distance (Feet): 10 Feet(with one seated rest break)  Assistive device: Rolling walker (2 wheeled)  Gait Pattern/deviations: Steppage, Decreased stance time - right, Decreased step length - left(R)  General Gait Details: Used Ace wrap R foot for some dorsiflexion assist and for some extra stability R foot, as Morrigan described feeling like her Right ankle was "rolling" last session; Painful in R stance        ADL:       Cognition:  Cognition  Overall Cognitive Status: Within Functional Limits for tasks assessed  Orientation Level: Oriented X4  Cognition  Arousal/Alertness: Awake/alert  Behavior During Therapy: WFL for tasks assessed/performed  Overall Cognitive Status: Within Functional Limits for  tasks assessed        Blood pressure 137/76, pulse (!) 109, temperature 98.1 F (36.7 C), temperature source Oral, resp. rate 19, height 5' 6.5" (1.689 m), weight 124.2 kg (273 lb 13 oz), SpO2 97 %.  Physical Exam   Nursing note and vitals reviewed.  Constitutional:  She is oriented to person, place, and time. She appears well-developed and well-nourished. No distress.  Obese female. Fatigued appearing.  NAD.    HENT:   Head: Normocephalic and atraumatic.   Mouth/Throat: Oropharynx is clear and moist.   Eyes: Conjunctivae and EOM are normal. Pupils are equal, round, and reactive to light.   Neck: Normal range of motion. Neck supple.   Cardiovascular: Regular rhythm. Tachycardia present.   Respiratory: Effort normal. No stridor. No respiratory distress. She has decreased breath sounds.   GI: Soft. Bowel sounds are normal. She exhibits no distension. There is no tenderness.  Musculoskeletal: She exhibits edema and tenderness (dysesthesis RLE).  2+ edema right hip/thigh with  pain at attempts at ROM.  Right foot drop with 2+ edema.   Neurological: She is alert and oriented to person, place, and time. Coordination abnormal.  Speech mildly dysarthric. Able to answer biographic questions without difficulty. Unable to recall details of admission--- except " Rhabdomyolysis"  Reports dysesthesias right hemi body. Moves all 4, left more than right   Skin: Skin is warm and dry. She is not diaphoretic.  Psychiatric: Her speech is normal. Her affect is blunt. Cognition and memory are impaired.         Lab Results Last 24 Hours  Imaging Results (Last 48 hours)                    Assessment/Plan:  Diagnosis: Functional and mobility deficits secondary to rhabdomyolysis/encephalopathy after suicide attempt  1.Does the need for close, 24 hr/day medical supervision in concert with the patient's rehab needs make it unreasonable for this patient to be served in a less intensive setting? Yes   2.Co-Morbidities requiring supervision/potential complications: bipolar and schizoaffective disrder, acute renal failure, htn   3.Due to bladder management, bowel management, safety, skin/wound care, disease management, medication administration, pain management and patient education, does the patient require 24 hr/day rehab nursing? Yes   4.Does the patient require coordinated care of a physician, rehab nurse, PT (1-2 hrs/day, 5 days/week), OT (1-2 hrs/day, 5 days/week) and SLP (1-2 hrs/day, 5 days/week) to address physical and functional deficits in the context of the above  medical diagnosis(es)? Yes Addressing deficits in the following areas: balance, endurance, locomotion, strength, transferring, bowel/bladder control, bathing, dressing, feeding, grooming, toileting, cognition, speech and psychosocial support   5.Can the patient actively participate in an intensive therapy program of at least 3 hrs of therapy per day at least 5 days per week? Yes   6.The potential for patient to make measurable gains while on inpatient rehab is good   7.Anticipated functional outcomes upon discharge from inpatient rehab are modified independent and supervision  with PT, modified independent and supervision with OT, modified independent and supervision with SLP.   8.Estimated rehab length of stay to reach the above functional goals is: potentially 10-15 days   9.Anticipated D/C setting: Home   10.Anticipated post D/C treatments: [place] therapy   11.Overall Rehab/Functional Prognosis: good      RECOMMENDATIONS:  This patient's condition is appropriate for continued rehabilitative care in the following setting: CIR  Patient has agreed to participate in recommended program. Potentially  Note that insurance prior authorization may be required for reimbursement for recommended care.     Comment: Rehab Admissions Coordinator to follow up.     Thanks,     Jenna Staggers, MD, Mell[facility] Drown         Bary Leriche, PA-C  04/12/2017                Revision History                                        Routing History

## 2017-04-29 NOTE — Progress Notes (Signed)
Physical Therapy Treatment Patient Details Name: Jenna Hill MRN: [id number] DOB: [birth year] Today's Date: 04/29/2017    History of Present Illness pt is a 47 y/o female with pmh significant for schizophrenia, HTN, bipolar d/o and attempted suicide admitted with AMS and hypothermia, suspected intentional OD of psych medication.  Treating for aspiration and sepsis.  Extubated 3/8. Received HD on same day. has been on HD, there some hope for renal function recovery. She has been having leukocytosis of unclear etiology. She has been receiving Unasyn to cover for presume cellulitis of right lower extremity. She had CT hip that showed edema vs infection. She was evaluated by ortho and there was not concern for compartment syndrome.    PT Comments    Pt performed gait, functional transfers and stretching to R heel cord and ankle.  Plan remains appropriate for CIR for aggressive therapies to improve strength and function before returning home.  Pt remains limited due to R foot drop and instability in standing.  Plan next session to continue functional mobility and stretching to R heel cord to improve fit in CAM walker to promote dorsiflexion.  Donned TED hose post session due to increased edema in R foot/ankle.    Follow Up Recommendations  CIR     Equipment Recommendations  Rolling walker with 5" wheels;3in1 (PT)    Recommendations for Other Services Rehab consult     Precautions / Restrictions Precautions Precautions: Fall Precaution Comments: No pf/df at R Foot. Required Braces or Orthoses: Other Brace/Splint(CAM BOOT) Restrictions Weight Bearing Restrictions: No    Mobility  Bed Mobility               General bed mobility comments: Pt sitting in recliner on arrival.    Transfers Overall transfer level: Needs assistance Equipment used: Rolling walker (2 wheeled) Transfers: Sit to/from Stand Sit to Stand: Min guard         General transfer comment: Cues for hand  placement to and from seated surface and cues for backing entirely to seated surface before sitting.  Pt remains to present with poor eccentric loading.    Ambulation/Gait Ambulation/Gait assistance: Min assist Ambulation Distance (Feet): 72 Feet Assistive device: Rolling walker (2 wheeled) Gait Pattern/deviations: Decreased stance time - right;Decreased step length - left;Trunk flexed;Antalgic Gait velocity: decreased Gait velocity interpretation: Below normal speed for age/gender General Gait Details: Pt quick to push RW ahead and required cues to step into RW for added support.  Pt with shuffling step on L due to pain in R foot in stance phase.  Adjusted CAM walker pre gait to promote dorsiflexion.     Stairs            Wheelchair Mobility    Modified Rankin (Stroke Patients Only)       Balance Overall balance assessment: Needs assistance   Sitting balance-Leahy Scale: Fair       Standing balance-Leahy Scale: Poor                              Cognition Arousal/Alertness: Awake/alert Behavior During Therapy: WFL for tasks assessed/performed Overall Cognitive Status: Within Functional Limits for tasks assessed                                        Exercises Other Exercises Other Exercises: Performed ankle dorsiflexion with towel to improve strech  to R heel cord. 3 x10 sec holds.   Other Exercises: Knee flexion with R dorsiflexion on cylinder to promote ankle and knee ROM and stretch plantar fascia.  x10.      General Comments        Pertinent Vitals/Pain Pain Assessment: 0-10 Pain Score: 8  Pain Location: RLE Pain Descriptors / Indicators: Sore;Tightness;Tingling;Grimacing Pain Intervention(s): Monitored during session;Premedicated before session;Limited activity within patient's tolerance    Home Living                      Prior Function            PT Goals (current goals can now be found in the care plan  section) Acute Rehab PT Goals Patient Stated Goal: to get stonger Time For Goal Achievement: 05/13/17 Potential to Achieve Goals: Good Progress towards PT goals: Progressing toward goals    Frequency    Min 3X/week      PT Plan Current plan remains appropriate    Co-evaluation              AM-PAC PT "6 Clicks" Daily Activity  Outcome Measure  Difficulty turning over in bed (including adjusting bedclothes, sheets and blankets)?: A Little Difficulty moving from lying on back to sitting on the side of the bed? : Unable Difficulty sitting down on and standing up from a chair with arms (e.g., wheelchair, bedside commode, etc,.)?: Unable Help needed moving to and from a bed to chair (including a wheelchair)?: A Little Help needed walking in hospital room?: A Little Help needed climbing 3-5 steps with a railing? : A Lot 6 Click Score: 13    End of Session Equipment Utilized During Treatment: Gait belt Activity Tolerance: Patient tolerated treatment well Patient left: with call bell/phone within reach;in bed;with bed alarm set Nurse Communication: Mobility status PT Visit Diagnosis: Unsteadiness on feet (R26.81);Other abnormalities of gait and mobility (R26.89);Muscle weakness (generalized) (M62.81);Other symptoms and signs involving the nervous system (R29.898)     Time: 6144-3154 PT Time Calculation (min) (ACUTE ONLY): 26 min  Charges:  $Gait Training: 8-22 mins $Therapeutic Activity: 8-22 mins                    G Codes:       Governor Rooks, PTA pager [phone]    Cristela Blue 04/29/2017, 12:32 PM

## 2017-04-29 NOTE — Progress Notes (Signed)
Patient arrived to unit in no acute distress and in pleasant mood. Answers questions appropriately, complains of pain/numbness/tingling in right side. Went over pink sheet, call bell, pink belt, and fall prevention safety plan, observed talking to self and stated someone didn't want her to sign anything. Pt signed fall prevention safety plan with an X.

## 2017-04-30 ENCOUNTER — Inpatient Hospital Stay (HOSPITAL_COMMUNITY): Payer: Self-pay | Admitting: Occupational Therapy

## 2017-04-30 ENCOUNTER — Inpatient Hospital Stay (HOSPITAL_COMMUNITY): Payer: Self-pay | Admitting: Physical Therapy

## 2017-04-30 DIAGNOSIS — S7401XD Injury of sciatic nerve at hip and thigh level, right leg, subsequent encounter: Secondary | ICD-10-CM

## 2017-04-30 DIAGNOSIS — M6282 Rhabdomyolysis: Secondary | ICD-10-CM

## 2017-04-30 LAB — COMPREHENSIVE METABOLIC PANEL
ALK PHOS: 89 U/L (ref 38–126)
ALT: 13 U/L — AB (ref 14–54)
AST: 27 U/L (ref 15–41)
Albumin: 2.3 g/dL — ABNORMAL LOW (ref 3.5–5.0)
Anion gap: 14 (ref 5–15)
BUN: 45 mg/dL — ABNORMAL HIGH (ref 6–20)
CALCIUM: 9.2 mg/dL (ref 8.9–10.3)
CO2: 25 mmol/L (ref 22–32)
CREATININE: 7.15 mg/dL — AB (ref 0.44–1.00)
Chloride: 101 mmol/L (ref 101–111)
GFR, EST AFRICAN AMERICAN: 7 mL/min — AB (ref >60)
GFR, EST NON AFRICAN AMERICAN: 6 mL/min — AB (ref >60)
Glucose, Bld: 112 mg/dL — ABNORMAL HIGH (ref 65–99)
Potassium: 3.1 mmol/L — ABNORMAL LOW (ref 3.5–5.1)
Sodium: 140 mmol/L (ref 135–145)
Total Bilirubin: 0.7 mg/dL (ref 0.3–1.2)
Total Protein: 6.1 g/dL — ABNORMAL LOW (ref 6.5–8.1)

## 2017-04-30 LAB — CBC WITH DIFFERENTIAL/PLATELET
Basophils Absolute: 0 10*3/uL (ref 0.0–0.1)
Basophils Relative: 0 %
Eosinophils Absolute: 0.4 10*3/uL (ref 0.0–0.7)
Eosinophils Relative: 4 %
HCT: 30.4 % — ABNORMAL LOW (ref 36.0–46.0)
HEMOGLOBIN: 9.5 g/dL — AB (ref 12.0–15.0)
LYMPHS ABS: 2.9 10*3/uL (ref 0.7–4.0)
LYMPHS PCT: 33 %
MCH: 28.9 pg (ref 26.0–34.0)
MCHC: 31.3 g/dL (ref 30.0–36.0)
MCV: 92.4 fL (ref 78.0–100.0)
Monocytes Absolute: 0.6 10*3/uL (ref 0.1–1.0)
Monocytes Relative: 7 %
NEUTROS PCT: 56 %
Neutro Abs: 4.7 10*3/uL (ref 1.7–7.7)
Platelets: 323 10*3/uL (ref 150–400)
RBC: 3.29 MIL/uL — ABNORMAL LOW (ref 3.87–5.11)
RDW: 14.8 % (ref 11.5–15.5)
WBC: 8.6 10*3/uL (ref 4.0–10.5)

## 2017-04-30 NOTE — Evaluation (Signed)
Physical Therapy Assessment and Plan  Patient Details  Name: Jenna Hill MRN: [id number] Date of Birth: 23-Oct-1970  PT Diagnosis: Abnormality of gait, Difficulty walking, Impaired sensation, Muscle weakness and Pain in joint Rehab Potential: Good ELOS: 12-14 days   Today's Date: 04/30/2017 PT Individual Time: 0830-0930 AND 1300-1400 PT Individual Time Calculation (min): 60 min  AND 60 min  Problem List:  Patient Active Problem List   Diagnosis Date Noted  . 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 AKI (acute kidney injury) ([place]) 04/25/2017  . Shock liver 04/10/2017  . Rhabdomyolysis 04/10/2017  . Bipolar 1 disorder ([place]) 04/10/2017  . Acute respiratory failure ([place])   . Acute encephalopathy 04/05/2017  . Pituitary microadenoma ([place]) 04/25/2015  . OSA (obstructive sleep apnea) 04/16/2015  . Obesity 04/16/2015  . Schizoaffective disorder, depressive type ([place]) 04/15/2015  . Hypertension 10/05/2012    Past Medical History:  Past Medical History:  Diagnosis Date  . Bipolar 1 disorder ([place])   . Difficult intubation   . Hypertension   . PONV (postoperative nausea and vomiting)   . Schizophrenia ([place])   . Spinal headache    Past Surgical History:  Past Surgical History:  Procedure Laterality Date  . ANKLE ARTHROSCOPY Right   . CESAREAN SECTION    . HAND SURGERY Right   . ORIF ANKLE FRACTURE Left 01/29/2016   Procedure: OPEN REDUCTION INTERNAL FIXATION LEFT ANKLE BIMALLEOLAR FRACTURE AND SYNDESMOSIS;  Surgeon: Wylene Simmer, MD;  Location: [facility];  Service: Orthopedics;  Laterality: Left;    Assessment & Plan Clinical Impression: Patient is a 47 y.o.femalewith history of bipolar disorder, schizophrenia, HTN , history of overdose/sucide attempts who was admitted after being found down on 04/05/17. Patient was obtunded, with dried vomitus on cheeks, hypothermic, tachycardic and had AKI. UDS negative and encephalopathy felt to be due to suicide attempt.  She was intubated for airway protection and started on IV unasyn for aspiration PNA and IVF for AKI with rhabdomyolysis. Sherequired HD briefly due to worsening of renal statuswithvolume overload with oliguria. She tolerated extubation on 3/8with intermittent HD. Urine output is improving and HD has been on hold since 3/20. SCr peaked at 11.3 and has started to trend downwards in the past few days.Anemia has been treated with transfusion, ferrlecit as well as aranesp. Psychiatry consulted for input and felt that AMS likely due to polypharmacy and not SI with recommendations to titrated Clozaril to home dose. She has had issues with nausea and CT abdomen/pelvis done revealing acute pancreatitis with diffuse subcutaneous and abdominal wall stranding and edema as well as edema of visualized upper thigh--no drainable fluid collection noted.As patient without abdominal pain andLipase negativesymptoms not felt to be due to pancreatitis. Abnormal LFTs felt to be due to shocked liver and rhabdomyolysis.She continued to report numbness with dysesthesias right hemibody and has had 2-3 + edema RLE with weakness and foot drop. Dr. Doreatha Martin was consulted due to concerns of compartment syndrome and felt that weakness and pain due to muscle damage from rhabdomyolysis. To use CAM boot/PRAFO to prevent foot drop and EMG/NCS in the future. Medical issues improving but BP remains labile. Patient noted to be limited by RLE weakness as well as debility due to multiple medical issues. CIR recommended due to functional deficits.Patient transferred to CIR on 04/29/2017 .   Patient currently requires min with mobility secondary to muscle weakness, muscle joint tightness and muscle paralysis, decreased cardiorespiratoy endurance, and decreased standing balance and decreased balance strategies.  Prior  to hospitalization, patient was independent  with mobility and lived with Alone in a [facility] home.  Home access is 3Stairs to  enter.  Patient will benefit from skilled PT intervention to maximize safe functional mobility, minimize fall risk and decrease caregiver burden for planned discharge home with intermittent assist.  Anticipate patient will benefit from follow up [facility] at discharge.  PT - End of Session Activity Tolerance: Tolerates < 10 min activity with changes in vital signs Endurance Deficit: Yes Endurance Deficit Description: decreased PT Assessment Rehab Potential (ACUTE/IP ONLY): Good PT Barriers to Discharge: Decreased caregiver support;Lack of/limited family support PT Barriers to Discharge Comments: Friends/family only available PRN PT Patient demonstrates impairments in the following area(s): Balance;Endurance;Edema;Motor;Pain;Safety;Sensory PT Transfers Functional Problem(s): Floor;Furniture;Car;Bed to Chair;Bed Mobility PT Locomotion Functional Problem(s): Ambulation;Wheelchair Mobility;Stairs PT Plan PT Intensity: Minimum of 1-2 x/day ,45 to 90 minutes PT Frequency: 5 out of 7 days PT Duration Estimated Length of Stay: 12-14 days PT Treatment/Interventions: Visual/perceptual remediation/compensation;Stair training;Pain management;Disease management/prevention;Ambulation/gait training;Balance/vestibular training;DME/adaptive equipment instruction;Patient/family education;Wheelchair propulsion/positioning;Therapeutic Exercise;Psychosocial support;Functional electrical stimulation;Therapeutic Activities;Cognitive remediation/compensation;Community reintegration;Functional mobility training;Skin care/wound management;UE/LE Strength taining/ROM;Splinting/orthotics;UE/LE Coordination activities;Neuromuscular re-education;Discharge planning PT Transfers Anticipated Outcome(s): Mod I  PT Locomotion Anticipated Outcome(s): Mod I gait PT Recommendation Follow Up Recommendations: Home health PT Patient destination: Home Equipment Recommended: To be determined  Skilled Therapeutic Intervention  Session 1:   Pt in supine and agreeable to therapy, pain as detailed below. Pt reports some nausea and vomiting this morning, she says this is typical for her in the morning while she's been here. Performed functional mobility as detailed below including bed mobility, transfers, w/c mobility, and gait at an overall min assist level. Total assist to don R CAM boot. Pt ambulated 25' before c/o dizziness, returned to sitting to take vitals, written below. Dizziness continued for a few minutes, although pt was able to return to w/c and transfer to recliner once in room. She stated she felt better in recliner, but still in a lot of discomfort/pain in RLE. Ended session in recliner, call bell within reach and all needs met. QRB donned.   Session 2:  Pt in recliner and agreeable to therapy, pain continues to be 7/10 in R leg/foot. Transferred to w/c via stand pivot w/ min guard using RW. Pt self-propelled w/c to/from therapy gym w/ supervision using BUEs to work on aerobic conditioning and endurance w/ functional mobility. Negotiated 4 steps as detailed below and performed car transfer w/ min assist. Briefly discussed option of using RAFO long-term for foot drop, showed pt example and discussed benefits. Will continue to assess it's effectiveness for this particular pt when a CAM boot is no longer required for safe mobility. Returned to room and pt instructed patient in PT evaluation and educated patient in POC, rehab potential, rehab goals, and discharge recommendations. Ended session in supine, call bell within reach and all needs met.   PT Evaluation Precautions/Restrictions Precautions Precautions: Fall Required Braces or Orthoses: Other Brace/Splint Other Brace/Splint: R CAM BOOT Restrictions Weight Bearing Restrictions: No General   Vital SignsTherapy Vitals Pulse Rate: (!) 112 BP: (!) 172/104 Patient Position (if appropriate): Sitting Pain Pain Assessment Pain Scale: 0-10 Pain Score: 7  Pain Location:  Leg Pain Orientation: Right Pain Frequency: Constant Patients Stated Pain Goal: 4 Pain Intervention(s): Medication (See eMAR);Repositioned Home Living/Prior Functioning Home Living Available Help at Discharge: Friend(s);Available PRN/intermittently Type of Home: Apartment Home Access: Stairs to enter Entrance Stairs-Number of Steps: 3 Entrance Stairs-Rails: None Home Layout: One level Bathroom Shower/Tub: Tub/shower unit  Bathroom Toilet: Programmer, systems: Yes Additional Comments: friend works 7 pm until 7 am and can check in on her  Lives With: Alone Prior Function Level of Independence: Independent with basic ADLs;Independent with transfers;Independent with homemaking with ambulation;Independent with gait  Able to Take Stairs?: Yes Driving: Yes Vocation: On disability Vocation Requirements: On disability for psych impairments Leisure: Hobbies-yes (Comment) Comments: enjoys reading, riding her motorcycle, and going for walks  Vision/Perception  Perception Perception: Within Functional Limits Praxis Praxis: Intact  Cognition Overall Cognitive Status: No family/caregiver present to determine baseline cognitive functioning Arousal/Alertness: Awake/alert Orientation Level: Oriented X4 Sensation Sensation Light Touch: Impaired Detail Light Touch Impaired Details: Impaired RLE;Impaired LLE(Impaired RLE globally, R foot absent sensation, LLE impaired on thighs) Proprioception: Appears Intact Coordination Gross Motor Movements are Fluid and Coordinated: No Fine Motor Movements are Fluid and Coordinated: Yes Coordination and Movement Description: Impaired coordination 2/2 LE weakness and LLE foot drop  Motor  Motor Motor: Other (comment);Hemiplegia Motor - Skilled Clinical Observations: Generalized weakness, R>L, absent R ankle PF and DF  Mobility Bed Mobility Bed Mobility: Rolling Right;Rolling Left;Supine to Sit;Sit to Supine Rolling Right: 5:  Supervision Rolling Right Details: Verbal cues for technique;Verbal cues for precautions/safety Rolling Left: 5: Supervision Rolling Left Details: Verbal cues for technique;Verbal cues for precautions/safety Supine to Sit: 5: Supervision Supine to Sit Details: Verbal cues for technique;Verbal cues for precautions/safety Sit to Supine: 5: Supervision Sit to Supine - Details: Verbal cues for technique;Verbal cues for precautions/safety Transfers Transfers: Yes Stand Pivot Transfers: 4: Min assist Stand Pivot Transfer Details: Verbal cues for technique;Verbal cues for precautions/safety;Verbal cues for safe use of DME/AE;Manual facilitation for placement Locomotion  Ambulation Ambulation: Yes Ambulation/Gait Assistance: 4: Min guard Ambulation Distance (Feet): 25 Feet Assistive device: Standard walker Gait Gait: Yes Gait Pattern: Impaired Gait Pattern: Antalgic;Step-to pattern Gait velocity: decreased Stairs / Additional Locomotion Stairs: Yes Stairs Assistance: 4: Min assist Stairs Assistance Details: Verbal cues for technique;Verbal cues for precautions/safety;Manual facilitation for weight shifting;Verbal cues for gait pattern Stair Management Technique: Two rails Number of Stairs: 4 Height of Stairs: 6 Wheelchair Mobility Wheelchair Mobility: Yes Wheelchair Assistance: 5: Investment banker, operational Details: Verbal cues for Marketing executive: Both upper extremities Wheelchair Parts Management: Supervision/cueing  Trunk/Postural Assessment  Cervical Assessment Cervical Assessment: Exceptions to WFL(forward head, rounded shoulder posture) Thoracic Assessment Thoracic Assessment: Within Functional Limits Lumbar Assessment Lumbar Assessment: Exceptions to WFL(posterior pelvic tilt) Postural Control Postural Control: Within Functional Limits  Balance Balance Balance Assessed: Yes Static Sitting Balance Static Sitting - Balance Support: No upper  extremity supported;Feet supported Static Sitting - Level of Assistance: 5: Stand by assistance Dynamic Sitting Balance Dynamic Sitting - Balance Support: No upper extremity supported;Feet supported;During functional activity Dynamic Sitting - Level of Assistance: 5: Stand by assistance Static Standing Balance Static Standing - Balance Support: Bilateral upper extremity supported;During functional activity Static Standing - Level of Assistance: 5: Stand by assistance Dynamic Standing Balance Dynamic Standing - Balance Support: No upper extremity supported;During functional activity Dynamic Standing - Level of Assistance: 4: Min assist Extremity Assessment  RLE Assessment RLE Assessment: Exceptions to [facility] RLE AROM (degrees) Overall AROM Right Lower Extremity: Deficits;Due to decreased strength RLE Overall AROM Comments: Absent active ankle PF and DF RLE PROM (degrees) Overall PROM Right Lower Extremity: Deficits RLE Overall PROM Comments: Increased hamstring and gastroc tightness, unable to go past neutral DF RLE Strength RLE Overall Strength: Deficits RLE Overall Strength Comments: 3+ to 4- hip and knee musculature, 0/5 ankle  LLE  Assessment LLE Assessment: Exceptions to WFL(4/5 globally)   See Function Navigator for Current Functional Status.   Refer to Care Plan for Long Term Goals  Recommendations for other services: None   Discharge Criteria: Patient will be discharged from PT if patient refuses treatment 3 consecutive times without medical reason, if treatment goals not met, if there is a change in medical status, if patient makes no progress towards goals or if patient is discharged from hospital.  The above assessment, treatment plan, treatment alternatives and goals were discussed and mutually agreed upon: by patient  Jenna Hill 04/30/2017, 12:11 PM

## 2017-04-30 NOTE — Progress Notes (Signed)
Subjective/Complaints:   Objective: Vital Signs: Blood pressure (!) 172/104, pulse (!) 112, temperature 98.7 F (37.1 C), temperature source Oral, resp. rate 18, height 5' (1.524 m), weight 114.3 kg (251 lb 15.8 oz), SpO2 93 %. No results found. Results for orders placed or performed during the hospital encounter of 04/29/17 (from the past 72 hour(s))  Renal function panel     Status: Abnormal   Collection Time: 04/29/17  5:03 PM  Result Value Ref Range   Sodium 139 135 - 145 mmol/L   Potassium 3.3 (L) 3.5 - 5.1 mmol/L   Chloride 101 101 - 111 mmol/L   CO2 26 22 - 32 mmol/L   Glucose, Bld 88 65 - 99 mg/dL   BUN 46 (H) 6 - 20 mg/dL   Creatinine, Ser 7.56 (H) 0.44 - 1.00 mg/dL   Calcium 9.4 8.9 - 10.3 mg/dL   Phosphorus 4.7 (H) 2.5 - 4.6 mg/dL   Albumin 2.4 (L) 3.5 - 5.0 g/dL   GFR calc non Af Amer 6 (L) >60 mL/min   GFR calc Af Amer 7 (L) >60 mL/min    Comment: (NOTE) The eGFR has been calculated using the CKD EPI equation. This calculation has not been validated in all clinical situations. eGFR's persistently <60 mL/min signify possible Chronic Kidney Disease.    Anion gap 12 5 - 15    Comment: Performed at [facility] [address], [place], [place] [address]  CBC     Status: Abnormal   Collection Time: 04/29/17  5:30 PM  Result Value Ref Range   WBC 8.8 4.0 - 10.5 K/uL   RBC 3.35 (L) 3.87 - 5.11 MIL/uL   Hemoglobin 9.8 (L) 12.0 - 15.0 g/dL   HCT 30.8 (L) 36.0 - 46.0 %   MCV 91.9 78.0 - 100.0 fL   MCH 29.3 26.0 - 34.0 pg   MCHC 31.8 30.0 - 36.0 g/dL   RDW 14.7 11.5 - 15.5 %   Platelets 331 150 - 400 K/uL    Comment: Performed at [facility], [place] [address], [place], [place] [address]  CBC WITH DIFFERENTIAL     Status: Abnormal   Collection Time: 04/30/17  3:21 AM  Result Value Ref Range   WBC 8.6 4.0 - 10.5 K/uL   RBC 3.29 (L) 3.87 - 5.11 MIL/uL   Hemoglobin 9.5 (L) 12.0 - 15.0 g/dL   HCT 30.4 (L) 36.0 - 46.0 %   MCV 92.4 78.0 - 100.0 fL   MCH  28.9 26.0 - 34.0 pg   MCHC 31.3 30.0 - 36.0 g/dL   RDW 14.8 11.5 - 15.5 %   Platelets 323 150 - 400 K/uL   Neutrophils Relative % 56 %   Neutro Abs 4.7 1.7 - 7.7 K/uL   Lymphocytes Relative 33 %   Lymphs Abs 2.9 0.7 - 4.0 K/uL   Monocytes Relative 7 %   Monocytes Absolute 0.6 0.1 - 1.0 K/uL   Eosinophils Relative 4 %   Eosinophils Absolute 0.4 0.0 - 0.7 K/uL   Basophils Relative 0 %   Basophils Absolute 0.0 0.0 - 0.1 K/uL    Comment: Performed at [facility] [address], [place], [place] [address]  Comprehensive metabolic panel     Status: Abnormal   Collection Time: 04/30/17  3:21 AM  Result Value Ref Range   Sodium 140 135 - 145 mmol/L   Potassium 3.1 (L) 3.5 - 5.1 mmol/L   Chloride 101 101 -  111 mmol/L   CO2 25 22 - 32 mmol/L   Glucose, Bld 112 (H) 65 - 99 mg/dL   BUN 45 (H) 6 - 20 mg/dL   Creatinine, Ser 7.15 (H) 0.44 - 1.00 mg/dL   Calcium 9.2 8.9 - 10.3 mg/dL   Total Protein 6.1 (L) 6.5 - 8.1 g/dL   Albumin 2.3 (L) 3.5 - 5.0 g/dL   AST 27 15 - 41 U/L   ALT 13 (L) 14 - 54 U/L   Alkaline Phosphatase 89 38 - 126 U/L   Total Bilirubin 0.7 0.3 - 1.2 mg/dL   GFR calc non Af Amer 6 (L) >60 mL/min   GFR calc Af Amer 7 (L) >60 mL/min    Comment: (NOTE) The eGFR has been calculated using the CKD EPI equation. This calculation has not been validated in all clinical situations. eGFR's persistently <60 mL/min signify possible Chronic Kidney Disease.    Anion gap 14 5 - 15    Comment: Performed at [facility] [address], [place], [place] [address]     HEENT: normal Cardio: RRR and tachy Resp: CTA B/L and unlabored GI: BS positive and non distended Extremity:  Pulses positive and No Edema Skin:   Intact Neuro: Alert/Oriented, Abnormal Sensory Reduced sensation dorsum of the left foot as well as little toe. and Abnormal Motor 0 at the right ankle dorsiflexor plantar flexor knee extensor and hip flexor are 4/55/5 bilateral upper limbs and 5/5 in left  lower limb Musc/Skel:  Swelling Dorsum of the right foot and ankle.  No pain with range of motion General no acute distress   Assessment/Plan: 1. Functional deficits secondary to rhabdomyolysis, right foot drop which require 3+ hours per day of interdisciplinary therapy in a comprehensive inpatient rehab setting. Physiatrist is providing close team supervision and 24 hour management of active medical problems listed below. Physiatrist and rehab team continue to assess barriers to discharge/monitor patient progress toward functional and medical goals. FIM:       Function - Toileting Toileting steps completed by patient: Adjust clothing prior to toileting, Performs perineal hygiene, Adjust clothing after toileting Toileting steps completed by helper: Adjust clothing after toileting Assist level: Touching or steadying assistance (Pt.75%)     Function - Chair/bed transfer Chair/bed transfer assist level: Touching or steadying assistance (Pt > 75%) Chair/bed transfer assistive device: Armrests     Function - Comprehension Comprehension: Auditory Comprehension assist level: Follows basic conversation/direction with no assist  Function - Expression Expression: Verbal Expression assist level: Expresses basic needs/ideas: With no assist  Function - Social Interaction Social Interaction assist level: Interacts appropriately with others with medication or extra time (anti-anxiety, antidepressant).  Function - Problem Solving Problem solving assist level: Solves complex problems: With extra time  Function - Memory Memory assist level: More than reasonable amount of time Patient normally able to recall (first 3 days only): Current season, Staff names and faces, That he or she is in a hospital, Location of own room  Medical Problem List and Plan: 1.Functional and mobility deficitssecondary to rhabdomyolysis and encephalopathy. Pt with right foot drop and dysesthesias----?sciatic  nerve injury due to prolonged down time? Do not see consistent signs of RUE sensory loss. -CIR eval's today -may need AFO  2. DVT Prophylaxis/Anticoagulation: Pharmaceutical:Heparin 3. Pain Management:Fentanyl patch with tramadol prn -add gabapentin for dysesthetic pain 4. Mood:LCSW to follow for evaluation and support. 5. Neuropsych: This patientiscapable of making decisions on herown behalf. 6. Skin/Wound Care:Pressure relief measures. 7. Fluids/Electrolytes/Nutrition:Strict  I/O with daily weights.  8. Rhabdomyolysis with acute renal failure: Continue to monitor UOP as well as labs for recovery. SCr trending downwards 11.3-->10.6-->9.7-->8.55 -->7.15 9. Leucocytosis: Resolved WBC 8.6.  10. Anemia: Will monitor with serial checks. H/H stable in 9-10 range.  11. Abnormal LFTs: Recheck in am. 12. Schizoaffective/depressive disorder: Continue Clozaril, Sinequan, Seroquel and Trintellix at bedtime.  13. HTN: Poorly controlled. Will monitor BP bid. Currently on Metoprolol bid. 3/29 started amlodipine per renal notes.May need to increase dose May need to increase metoprolol due to Tachycardia Vitals:   04/30/17 0500 04/30/17 0920  BP: (!) 153/92 (!) 172/104  Pulse: (!) 108 (!) 112  Resp: 18   Temp: 98.7 F (37.1 C)   SpO2: 93%      LOS (Days) 1 A FACE TO FACE EVALUATION WAS PERFORMED  Charlett Blake 04/30/2017, 11:37 AM

## 2017-04-30 NOTE — Evaluation (Signed)
Occupational Therapy Assessment and Plan  Patient Details  Name: Jenna Hill MRN: [id number] Date of Birth: 1970/05/03  OT Diagnosis: acute pain, altered mental status, muscle weakness (generalized) and swelling of limb Rehab Potential: Rehab Potential (ACUTE ONLY): Good ELOS: 12-24 days    Today's Date: 04/30/2017 OT Individual Time:  -        Problem List:  Patient Active Problem List   Diagnosis Date Noted  . 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 AKI (acute kidney injury) ([place]) 04/25/2017  . Shock liver 04/10/2017  . Rhabdomyolysis 04/10/2017  . Bipolar 1 disorder ([place]) 04/10/2017  . Acute respiratory failure ([place])   . Acute encephalopathy 04/05/2017  . Pituitary microadenoma ([place]) 04/25/2015  . OSA (obstructive sleep apnea) 04/16/2015  . Obesity 04/16/2015  . Schizoaffective disorder, depressive type ([place]) 04/15/2015  . Hypertension 10/05/2012    Past Medical History:  Past Medical History:  Diagnosis Date  . Bipolar 1 disorder ([place])   . Difficult intubation   . Hypertension   . PONV (postoperative nausea and vomiting)   . Schizophrenia ([place])   . Spinal headache    Past Surgical History:  Past Surgical History:  Procedure Laterality Date  . ANKLE ARTHROSCOPY Right   . CESAREAN SECTION    . HAND SURGERY Right   . ORIF ANKLE FRACTURE Left 01/29/2016   Procedure: OPEN REDUCTION INTERNAL FIXATION LEFT ANKLE BIMALLEOLAR FRACTURE AND SYNDESMOSIS;  Surgeon: Wylene Simmer, MD;  Location: [facility];  Service: Orthopedics;  Laterality: Left;    Assessment & Plan Clinical Impression:  Patient is a 47 y.o.femalewith history of bipolar disorder, schizophrenia, HTN , history of overdose/sucide attempts who was admitted after being found down on 04/05/17. Patient was obtunded, with dried vomitus on cheeks, hypothermic, tachycardic and had AKI. UDS negative and encephalopathy felt to be due to suicide attempt. She was intubated for airway protection and  started on IV unasyn for aspiration PNA and IVF for AKI with rhabdomyolysis. Sherequired HD briefly due to worsening of renal statuswithvolume overload with oliguria. She tolerated extubation on 3/8with intermittent HD. Urine output is improving and HD has been on hold since 3/20. SCr peaked at 11.3 and has started to trend downwards in the past few days.Anemia has been treated with transfusion, ferrlecit as well as aranesp. Psychiatry consulted for input and felt that AMS likely due to polypharmacy and not SI with recommendations to titrated Clozaril to home dose. She has had issues with nausea and CT abdomen/pelvis done revealing acute pancreatitis with diffuse subcutaneous and abdominal wall stranding and edema as well as edema of visualized upper thigh--no drainable fluid collection noted.As patient without abdominal pain andLipase negativesymptoms not felt to be due to pancreatitis. Abnormal LFTs felt to be due to shocked liver and rhabdomyolysis.She continued to report numbness with dysesthesias right hemibody and has had 2-3 + edema RLE with weakness and foot drop. Dr. Doreatha Martin was consulted due to concerns of compartment syndrome and felt that weakness and pain due to muscle damage from rhabdomyolysis. To use CAM boot/PRAFO to prevent foot drop and EMG/NCS in the future. Medical issues improving but BP remains labile. Patient noted to be limited by RLE weakness as well as debility due to multiple medical issues. CIR recommended due to functional deficits.Patient transferred to CIR on 04/29/2017 .  Patient transferred to CIR on 04/29/2017 .    Patient currently requires min with basic self-care skills and IADL secondary to muscle weakness, unbalanced muscle activation and decreased coordination and  decreased standing balance and decreased balance strategies.  Prior to hospitalization, patient could complete ADLs with independent .  Patient will benefit from skilled intervention to decrease  level of assist with basic self-care skills prior to discharge home independently.  Anticipate patient will require intermittent supervision and follow up home health.  OT - End of Session Activity Tolerance: Tolerates 30+ min activity with multiple rests Endurance Deficit: Yes Endurance Deficit Description: decreased OT Assessment Rehab Potential (ACUTE ONLY): Good OT Barriers to Discharge: Inaccessible home environment;Decreased caregiver support OT Barriers to Discharge Comments: lives alone in apartment OT Patient demonstrates impairments in the following area(s): Balance;Endurance;Sensory;Safety;Pain;Edema OT Basic ADL's Functional Problem(s): Grooming;Dressing;Toileting;Bathing;Eating OT Advanced ADL's Functional Problem(s): Simple Meal Preparation OT Transfers Functional Problem(s): Toilet;Tub/Shower OT Plan OT Intensity: Minimum of 1-2 x/day, 45 to 90 minutes OT Frequency: 5 out of 7 days OT Duration/Estimated Length of Stay: 12-24 days  OT Treatment/Interventions: Balance/vestibular training;Community reintegration;Self Care/advanced ADL retraining;Therapeutic Exercise;UE/LE Coordination activities;UE/LE Strength taining/ROM;Therapeutic Activities;Functional mobility training;Pain management;DME/adaptive equipment instruction;Discharge planning;Cognitive remediation/compensation OT Self Feeding Anticipated Outcome(s): mod I OT Basic Self-Care Anticipated Outcome(s): mod I OT Toileting Anticipated Outcome(s): mod I  OT Bathroom Transfers Anticipated Outcome(s): mod I  OT Recommendation Recommendations for Other Services: Therapeutic Recreation consult Patient destination: Home Follow Up Recommendations: Home health OT Equipment Recommended: Rolling walker with 5" wheels;To be determined   Skilled Therapeutic Intervention Treatment session focused on ADLs.self care training, transfer training, pt education, balance training. Upon entering room, pt upright in recliner chair  agreeable to AM ADLs but refused shower "not ready for that, too much pain." Pt c/o pain in R foot, CAM boot removed and noted swelling in ankle. MD made note of ankle. Pt completed sit<>stand transfers with RW and functional mobility in room with min A and v/c for safety with navigating room and barriers, focus on CAM boot control when walking. Pt completed toilet transfer with  Min a and v/c for hand/foot placement using grabbars. Pt completed UB/LB washing at sink side in sit to stand level. Completed BU ADLs with S and LB ADLs with mod-max A for lower legs. Therapist provided education on donning/doffing CAM boot for safety. Pt would benefit from AE including reacher/sock aid. Pt repositioned in recliner chair with min A with needs met as lunch arrived. Call bell, chair alarm, and quick release applied.   OT Evaluation Precautions/Restrictions  Precautions Precautions: Fall Precaution Comments: No pf/df at R Foot. Required Braces or Orthoses: Other Brace/Splint Other Brace/Splint: R CAM BOOT OOB Restrictions Weight Bearing Restrictions: No General Chart Reviewed: Yes Additional Pertinent History: history of bipolar disorder, schizophrenia, HTN , history of overdose/sucide attempts  Response to Previous Treatment: Patient reporting fatigue but able to participate Family/Caregiver Present: No Vital Signs  Pain Pain Assessment Pain Score: 7  Pain Type: Chronic pain Pain Location: Ankle Pain Orientation: Right Pain Descriptors / Indicators: Aching;Numbness;Tingling Pain Onset: On-going Patients Stated Pain Goal: 0 Pain Intervention(s): Repositioned Multiple Pain Sites: No Home Living/Prior Functioning Home Living Family/patient expects to be discharged to:: Private residence Living Arrangements: Alone Available Help at Discharge: Friend(s), Available PRN/intermittently Type of Home: Apartment Home Access: Stairs to enter Technical brewer of Steps: 3-5 Entrance Stairs-Rails:  None Home Layout: One level Bathroom Shower/Tub: Chiropodist: Standard Bathroom Accessibility: Yes Additional Comments: friend works 7 pm until 7 am and can check in on her  Lives With: Alone IADL History Homemaking Responsibilities: Yes Meal Prep Responsibility: Primary Laundry Responsibility: Primary Cleaning Responsibility: Primary Occupation: On disability Leisure  and Hobbies: ride my motorcycle and reading Prior Function Level of Independence: Independent with basic ADLs, Independent with homemaking with ambulation  Able to Take Stairs?: Yes Driving: Yes Vocation: On disability Vocation Requirements: On disability for psych impairments Leisure: Hobbies-yes (Comment) Comments: enjoys reading, riding her motorcycle, and going for walks  ADL ADL ADL Comments: see functional navigator Vision Baseline Vision/History: Wears glasses Wears Glasses: At all times Patient Visual Report: No change from baseline Perception  Perception: Within Functional Limits Praxis Praxis: Intact Cognition Overall Cognitive Status: No family/caregiver present to determine baseline cognitive functioning Arousal/Alertness: Awake/alert Year: 2019 Month: March Day of Week: Correct Memory: Appears intact Immediate Memory Recall: Sock;Blue;Bed Memory Recall: Sock;Blue Memory Recall Sock: With Cue Memory Recall Blue: Without Cue Attention: Focused Focused Attention: Appears intact Awareness: Appears intact Problem Solving: Appears intact Safety/Judgment: Appears intact Sensation Sensation Light Touch: Impaired Detail Light Touch Impaired Details: Impaired RLE;Impaired LLE Proprioception: Appears Intact Coordination Gross Motor Movements are Fluid and Coordinated: No Fine Motor Movements are Fluid and Coordinated: Yes Coordination and Movement Description: Impaired coordination 2/2 LE weakness and LLE foot drop  Motor  Motor Motor: Other (comment);Hemiplegia Motor -  Skilled Clinical Observations: Generalized weakness, R>L, absent R ankle PF and DF Mobility  Transfers Transfers: Sit to Stand;Stand to Sit Sit to Stand: 4: Min guard Stand to Sit: 4: Min guard  Trunk/Postural Assessment  Cervical Assessment Cervical Assessment: Exceptions to WFL(forward head, rounded shoulders) Thoracic Assessment Thoracic Assessment: Within Functional Limits Lumbar Assessment Lumbar Assessment: Exceptions to WFL(posterior pelvic tilt) Postural Control Postural Control: Within Functional Limits  Balance Balance Balance Assessed: Yes Static Sitting Balance Static Sitting - Balance Support: No upper extremity supported;Feet supported Static Sitting - Level of Assistance: 5: Stand by assistance Dynamic Sitting Balance Dynamic Sitting - Balance Support: No upper extremity supported;Feet supported;During functional activity Dynamic Sitting - Level of Assistance: 5: Stand by assistance Reach (Patient is able to reach ___ inches to right, left, forward, back): Yes during ADLs. Sitting balance - Comments: During LB dressing in sitting Static Standing Balance Static Standing - Balance Support: Bilateral upper extremity supported;During functional activity Static Standing - Level of Assistance: 5: Stand by assistance Dynamic Standing Balance Dynamic Standing - Balance Support: Right upper extremity supported Dynamic Standing - Level of Assistance: 5: Stand by assistance Extremity/Trunk Assessment RUE Assessment RUE Assessment: Within Functional Limits LUE Assessment LUE Assessment: Within Functional Limits   See Function Navigator for Current Functional Status.   Refer to Care Plan for Long Term Goals  Recommendations for other services: Neuropsych   Discharge Criteria: Patient will be discharged from OT if patient refuses treatment 3 consecutive times without medical reason, if treatment goals not met, if there is a change in medical status, if patient makes no  progress towards goals or if patient is discharged from hospital.  The above assessment, treatment plan, treatment alternatives and goals were discussed and mutually agreed upon: by patient  Delon [place] 04/30/2017, 2:06 PM

## 2017-05-01 LAB — RENAL FUNCTION PANEL
ANION GAP: 10 (ref 5–15)
Albumin: 2.4 g/dL — ABNORMAL LOW (ref 3.5–5.0)
BUN: 40 mg/dL — ABNORMAL HIGH (ref 6–20)
CHLORIDE: 104 mmol/L (ref 101–111)
CO2: 27 mmol/L (ref 22–32)
Calcium: 9 mg/dL (ref 8.9–10.3)
Creatinine, Ser: 5.91 mg/dL — ABNORMAL HIGH (ref 0.44–1.00)
GFR calc Af Amer: 9 mL/min — ABNORMAL LOW (ref >60)
GFR, EST NON AFRICAN AMERICAN: 8 mL/min — AB (ref >60)
GLUCOSE: 102 mg/dL — AB (ref 65–99)
PHOSPHORUS: 4.9 mg/dL — AB (ref 2.5–4.6)
POTASSIUM: 3.3 mmol/L — AB (ref 3.5–5.1)
Sodium: 141 mmol/L (ref 135–145)

## 2017-05-01 NOTE — Plan of Care (Signed)
  Problem: Consults Goal: RH GENERAL PATIENT EDUCATION Description See Patient Education module for education specifics. Outcome: Progressing   Problem: RH BOWEL ELIMINATION Goal: RH STG MANAGE BOWEL WITH ASSISTANCE Description STG Manage Bowel with mod I  Assistance.  Outcome: Progressing   Problem: RH SKIN INTEGRITY Goal: RH STG SKIN FREE OF INFECTION/BREAKDOWN Description With min assist  Outcome: Progressing Goal: RH STG MAINTAIN SKIN INTEGRITY WITH ASSISTANCE Description STG Maintain Skin Integrity With min Assistance.  Outcome: Progressing   Problem: RH SAFETY Goal: RH STG DECREASED RISK OF FALL WITH ASSISTANCE Description STG Decreased Risk of Fall With  [employer].  Outcome: Progressing   Problem: RH PAIN MANAGEMENT Goal: RH STG PAIN MANAGED AT OR BELOW PT'S PAIN GOAL Description At or below level 6  Outcome: Progressing

## 2017-05-01 NOTE — Progress Notes (Signed)
Subjective/Complaints: Patient without new complaints today she does have some funny feelings in the right foot. Review of systems denies chest pain shortness of breath nausea vomiting diarrhea, + constipation  Objective: Vital Signs: Blood pressure (!) 142/82, pulse 97, temperature 99 F (37.2 C), temperature source Oral, resp. rate 18, height 5' (1.524 m), weight 115 kg (253 lb 8.5 oz), SpO2 100 %. No results found. Results for orders placed or performed during the hospital encounter of 04/29/17 (from the past 72 hour(s))  Renal function panel     Status: Abnormal   Collection Time: 04/29/17  5:03 PM  Result Value Ref Range   Sodium 139 135 - 145 mmol/L   Potassium 3.3 (L) 3.5 - 5.1 mmol/L   Chloride 101 101 - 111 mmol/L   CO2 26 22 - 32 mmol/L   Glucose, Bld 88 65 - 99 mg/dL   BUN 46 (H) 6 - 20 mg/dL   Creatinine, Ser 7.56 (H) 0.44 - 1.00 mg/dL   Calcium 9.4 8.9 - 10.3 mg/dL   Phosphorus 4.7 (H) 2.5 - 4.6 mg/dL   Albumin 2.4 (L) 3.5 - 5.0 g/dL   GFR calc non Af Amer 6 (L) >60 mL/min   GFR calc Af Amer 7 (L) >60 mL/min    Comment: (NOTE) The eGFR has been calculated using the CKD EPI equation. This calculation has not been validated in all clinical situations. eGFR's persistently <60 mL/min signify possible Chronic Kidney Disease.    Anion gap 12 5 - 15    Comment: Performed at [facility] [address] [place], [place] [address]  CBC     Status: Abnormal   Collection Time: 04/29/17  5:30 PM  Result Value Ref Range   WBC 8.8 4.0 - 10.5 K/uL   RBC 3.35 (L) 3.87 - 5.11 MIL/uL   Hemoglobin 9.8 (L) 12.0 - 15.0 g/dL   HCT 30.8 (L) 36.0 - 46.0 %   MCV 91.9 78.0 - 100.0 fL   MCH 29.3 26.0 - 34.0 pg   MCHC 31.8 30.0 - 36.0 g/dL   RDW 14.7 11.5 - 15.5 %   Platelets 331 150 - 400 K/uL    Comment: Performed at [facility], [place] [address], [place], [place] [address]  CBC WITH DIFFERENTIAL     Status: Abnormal   Collection Time: 04/30/17  3:21 AM  Result Value  Ref Range   WBC 8.6 4.0 - 10.5 K/uL   RBC 3.29 (L) 3.87 - 5.11 MIL/uL   Hemoglobin 9.5 (L) 12.0 - 15.0 g/dL   HCT 30.4 (L) 36.0 - 46.0 %   MCV 92.4 78.0 - 100.0 fL   MCH 28.9 26.0 - 34.0 pg   MCHC 31.3 30.0 - 36.0 g/dL   RDW 14.8 11.5 - 15.5 %   Platelets 323 150 - 400 K/uL   Neutrophils Relative % 56 %   Neutro Abs 4.7 1.7 - 7.7 K/uL   Lymphocytes Relative 33 %   Lymphs Abs 2.9 0.7 - 4.0 K/uL   Monocytes Relative 7 %   Monocytes Absolute 0.6 0.1 - 1.0 K/uL   Eosinophils Relative 4 %   Eosinophils Absolute 0.4 0.0 - 0.7 K/uL   Basophils Relative 0 %   Basophils Absolute 0.0 0.0 - 0.1 K/uL    Comment: Performed at [facility] [address] [place], [place] [address]  Comprehensive metabolic panel     Status: Abnormal   Collection Time: 04/30/17  3:21 AM  Result Value Ref Range   Sodium 140 135 - 145 mmol/L   Potassium 3.1 (L) 3.5 - 5.1 mmol/L   Chloride 101 101 - 111 mmol/L   CO2 25 22 - 32 mmol/L   Glucose, Bld 112 (H) 65 - 99 mg/dL   BUN 45 (H) 6 - 20 mg/dL   Creatinine, Ser 7.15 (H) 0.44 - 1.00 mg/dL   Calcium 9.2 8.9 - 10.3 mg/dL   Total Protein 6.1 (L) 6.5 - 8.1 g/dL   Albumin 2.3 (L) 3.5 - 5.0 g/dL   AST 27 15 - 41 U/L   ALT 13 (L) 14 - 54 U/L   Alkaline Phosphatase 89 38 - 126 U/L   Total Bilirubin 0.7 0.3 - 1.2 mg/dL   GFR calc non Af Amer 6 (L) >60 mL/min   GFR calc Af Amer 7 (L) >60 mL/min    Comment: (NOTE) The eGFR has been calculated using the CKD EPI equation. This calculation has not been validated in all clinical situations. eGFR's persistently <60 mL/min signify possible Chronic Kidney Disease.    Anion gap 14 5 - 15    Comment: Performed at [facility] [address], [place], [place] [address]  Renal function panel     Status: Abnormal   Collection Time: 05/01/17  4:18 AM  Result Value Ref Range   Sodium 141 135 - 145 mmol/L   Potassium 3.3 (L) 3.5 - 5.1 mmol/L   Chloride 104 101 - 111 mmol/L   CO2 27 22 - 32 mmol/L   Glucose,  Bld 102 (H) 65 - 99 mg/dL   BUN 40 (H) 6 - 20 mg/dL   Creatinine, Ser 5.91 (H) 0.44 - 1.00 mg/dL   Calcium 9.0 8.9 - 10.3 mg/dL   Phosphorus 4.9 (H) 2.5 - 4.6 mg/dL   Albumin 2.4 (L) 3.5 - 5.0 g/dL   GFR calc non Af Amer 8 (L) >60 mL/min   GFR calc Af Amer 9 (L) >60 mL/min    Comment: (NOTE) The eGFR has been calculated using the CKD EPI equation. This calculation has not been validated in all clinical situations. eGFR's persistently <60 mL/min signify possible Chronic Kidney Disease.    Anion gap 10 5 - 15    Comment: Performed at [facility] [address], [place], [place] [address]     HEENT: normal Cardio: RRR and tachy Resp: CTA B/L and unlabored GI: BS positive and non distended Extremity:  Pulses positive and No Edema Skin:   Intact Neuro: Alert/Oriented, Abnormal Sensory Reduced sensation dorsum of the left foot as well as little toe. and Abnormal Motor 0 at the right ankle dorsiflexor plantar flexor knee extensor and hip flexor are 4/55/5 bilateral upper limbs and 5/5 in left lower limb Musc/Skel:  Swelling Dorsum of the right foot and ankle.  No pain with range of motion General no acute distress   Assessment/Plan: 1. Functional deficits secondary to rhabdomyolysis, right foot drop which require 3+ hours per day of interdisciplinary therapy in a comprehensive inpatient rehab setting. Physiatrist is providing close team supervision and 24 hour management of active medical problems listed below. Physiatrist and rehab team continue to assess barriers to discharge/monitor patient progress toward functional and medical goals. FIM: Function - Bathing Position: Wheelchair/chair at sink Body parts bathed by patient: Left arm, Chest, Right arm, Buttocks, Right upper leg, Left upper leg, Abdomen, Front perineal area Body parts bathed by helper: Right lower leg, Back, Left lower leg Assist  Level: Touching or steadying assistance(Pt > 75%)  Function- Upper Body  Dressing/Undressing What is the patient wearing?: Pull over shirt/dress Pull over shirt/dress - Perfomed by patient: Thread/unthread right sleeve, Thread/unthread left sleeve, Put head through opening, Pull shirt over trunk Assist Level: Supervision or verbal cues Function - Lower Body Dressing/Undressing What is the patient wearing?: Pants, Non-skid slipper socks Position: Wheelchair/chair at sink Pants- Performed by patient: Pull pants up/down Pants- Performed by helper: Thread/unthread right pants leg, Thread/unthread left pants leg Non-skid slipper socks- Performed by helper: Don/doff right sock, Don/doff left sock Assist for footwear: Maximal assist(CAM boot) Assist for lower body dressing: Touching or steadying assistance (Pt > 75%)  Function - Toileting Toileting steps completed by patient: Adjust clothing prior to toileting, Performs perineal hygiene, Adjust clothing after toileting Toileting steps completed by helper: Adjust clothing after toileting Assist level: Touching or steadying assistance (Pt.75%)  Function - Air cabin crew transfer assistive device: Grab bar Assist level to toilet: Touching or steadying assistance (Pt > 75%) Assist level from toilet: Touching or steadying assistance (Pt > 75%)  Function - Chair/bed transfer Chair/bed transfer method: Stand pivot Chair/bed transfer assist level: Touching or steadying assistance (Pt > 75%) Chair/bed transfer assistive device: Armrests Chair/bed transfer details: Verbal cues for safe use of DME/AE, Manual facilitation for weight shifting, Verbal cues for precautions/safety, Verbal cues for technique  Function - Locomotion: Wheelchair Will patient use wheelchair at discharge?: (TBD) Type: Manual Max wheelchair distance: 150' Assist Level: Supervision or verbal cues Assist Level: Supervision or verbal cues Assist Level: Supervision or verbal cues Turns around,maneuvers to table,bed, and toilet,negotiates 3%  grade,maneuvers on rugs and over doorsills: No Function - Locomotion: Ambulation Assistive device: Walker-rolling Max distance: 25' Assist level: Touching or steadying assistance (Pt > 75%) Assist level: Touching or steadying assistance (Pt > 75%) Walk 50 feet with 2 turns activity did not occur: Safety/medical concerns Walk 150 feet activity did not occur: Safety/medical concerns Walk 10 feet on uneven surfaces activity did not occur: Safety/medical concerns  Function - Comprehension Comprehension: Auditory Comprehension assist level: Follows basic conversation/direction with no assist  Function - Expression Expression: Verbal Expression assist level: Expresses basic needs/ideas: With no assist  Function - Social Interaction Social Interaction assist level: Interacts appropriately with others with medication or extra time (anti-anxiety, antidepressant).  Function - Problem Solving Problem solving assist level: Solves complex 90% of the time/cues < 10% of the time  Function - Memory Memory assist level: More than reasonable amount of time Patient normally able to recall (first 3 days only): Current season, Staff names and faces, That he or she is in a hospital  Medical Problem List and Plan: 1.Functional and mobility deficitssecondary to rhabdomyolysis and encephalopathy. Pt with right foot drop and dysesthesias----?sciatic nerve injury due to prolonged down time? Do not see consistent signs of RUE sensory loss. -CIR PT, OT, SLP -may need AFO  2. DVT Prophylaxis/Anticoagulation: Pharmaceutical:Heparin 3. Pain Management:Fentanyl patch with tramadol prn -add gabapentin for dysesthetic pain 4. Mood:LCSW to follow for evaluation and support. 5. Neuropsych: This patientiscapable of making decisions on herown behalf. 6. Skin/Wound Care:Pressure relief measures. 7. Fluids/Electrolytes/Nutrition:Strict I/O with daily weights.  8.  Rhabdomyolysis with acute renal failure: Continue to monitor UOP as well as labs for recovery. SCr trending downwards 11.3-->10.6-->9.7-->8.55 -->7.15 9. Leucocytosis: Resolved WBC 8.6.  10. Anemia: Will monitor with serial checks. H/H stable in 9-10 range.  11. Abnormal LFTs: Recheck in am. 12. Schizoaffective/depressive disorder: Continue Clozaril, Sinequan, Seroquel and Trintellix at  bedtime.  13. HTN: Poorly controlled. Will monitor BP bid. Currently on Metoprolol bid. 3/29 started amlodipine per renal notes.May need to increase dose May need to increase metoprolol due to Tachycardia Vitals:   04/30/17 2022 05/01/17 0500  BP: (!) 155/90 (!) 142/82  Pulse: (!) 112 97  Resp:  18  Temp:  99 F (37.2 C)  SpO2:  100%  BP improved today 14.  Hypokalemia, mild,  improving, KCl 27mq daily LOS (Days) 2 A FACE TO FACE EVALUATION WAS PERFORMED  ACharlett Blake3/31/2019, 10:46 AM

## 2017-05-01 NOTE — Progress Notes (Signed)
Lethargic this AM. Woke up long enough to take AM meds. While laying bed flat to get weight, patient threw up approximately 100cc's partially digested food. C/O feeling dizzy when standing to transfer to recliner. Large incontinent void. More alert after above events.Jenna Hill A

## 2017-05-01 NOTE — Plan of Care (Signed)
  Problem: Consults Goal: RH GENERAL PATIENT EDUCATION Description See Patient Education module for education specifics. 05/01/2017 0051 by Cornell Barman, RN Outcome: Progressing 05/01/2017 0036 by Cornell Barman, RN Outcome: Progressing   Problem: RH BOWEL ELIMINATION Goal: RH STG MANAGE BOWEL WITH ASSISTANCE Description STG Manage Bowel with mod I  Assistance.  05/01/2017 0051 by Cornell Barman, RN Outcome: Progressing 05/01/2017 0036 by Cornell Barman, RN Outcome: Progressing   Problem: RH SKIN INTEGRITY Goal: RH STG SKIN FREE OF INFECTION/BREAKDOWN Description With min assist  05/01/2017 0051 by Cornell Barman, RN Outcome: Progressing 05/01/2017 0036 by Cornell Barman, RN Outcome: Progressing Goal: RH STG MAINTAIN SKIN INTEGRITY WITH ASSISTANCE Description STG Maintain Skin Integrity With min Assistance.  05/01/2017 0051 by Cornell Barman, RN Outcome: Progressing 05/01/2017 0036 by Cornell Barman, RN Outcome: Progressing   Problem: RH SAFETY Goal: RH STG DECREASED RISK OF FALL WITH ASSISTANCE Description STG Decreased Risk of Fall With  [employer].  05/01/2017 0051 by Cornell Barman, RN Outcome: Progressing 05/01/2017 0036 by Cornell Barman, RN Outcome: Progressing   Problem: RH PAIN MANAGEMENT Goal: RH STG PAIN MANAGED AT OR BELOW PT'S PAIN GOAL Description At or below level 6  05/01/2017 0051 by Cornell Barman, RN Outcome: Progressing 05/01/2017 0036 by Cornell Barman, RN Outcome: Progressing   Problem: RH KNOWLEDGE DEFICIT GENERAL Goal: RH STG INCREASE KNOWLEDGE OF SELF CARE AFTER HOSPITALIZATION Description Pt will be able to direct self care p discharge with cues/reminders and use resources independently  Outcome: Progressing

## 2017-05-02 ENCOUNTER — Inpatient Hospital Stay (HOSPITAL_COMMUNITY): Payer: Self-pay | Admitting: Occupational Therapy

## 2017-05-02 ENCOUNTER — Inpatient Hospital Stay (HOSPITAL_COMMUNITY): Payer: Self-pay | Admitting: Physical Therapy

## 2017-05-02 DIAGNOSIS — N179 Acute kidney failure, unspecified: Secondary | ICD-10-CM

## 2017-05-02 LAB — RENAL FUNCTION PANEL
Albumin: 2.4 g/dL — ABNORMAL LOW (ref 3.5–5.0)
Anion gap: 12 (ref 5–15)
BUN: 34 mg/dL — AB (ref 6–20)
CHLORIDE: 102 mmol/L (ref 101–111)
CO2: 27 mmol/L (ref 22–32)
Calcium: 8.9 mg/dL (ref 8.9–10.3)
Creatinine, Ser: 4.32 mg/dL — ABNORMAL HIGH (ref 0.44–1.00)
GFR calc Af Amer: 13 mL/min — ABNORMAL LOW (ref >60)
GFR calc non Af Amer: 11 mL/min — ABNORMAL LOW (ref >60)
GLUCOSE: 104 mg/dL — AB (ref 65–99)
POTASSIUM: 3.2 mmol/L — AB (ref 3.5–5.1)
Phosphorus: 4 mg/dL (ref 2.5–4.6)
Sodium: 141 mmol/L (ref 135–145)

## 2017-05-02 MED ORDER — ACETAMINOPHEN 325 MG PO TABS
650.0000 mg | ORAL_TABLET | Freq: Once | ORAL | Status: AC
  Administered 2017-05-02: 650 mg via ORAL
  Filled 2017-05-02: qty 2

## 2017-05-02 MED ORDER — CLONIDINE HCL 0.1 MG PO TABS
0.1000 mg | ORAL_TABLET | Freq: Four times a day (QID) | ORAL | Status: DC | PRN
  Administered 2017-05-02 – 2017-05-03 (×2): 0.1 mg via ORAL
  Filled 2017-05-02 (×2): qty 1

## 2017-05-02 MED ORDER — METOPROLOL TARTRATE 50 MG PO TABS
100.0000 mg | ORAL_TABLET | Freq: Two times a day (BID) | ORAL | Status: DC
  Administered 2017-05-02 – 2017-05-12 (×20): 100 mg via ORAL
  Filled 2017-05-02 (×20): qty 2

## 2017-05-02 MED ORDER — METOPROLOL TARTRATE 25 MG PO TABS
25.0000 mg | ORAL_TABLET | Freq: Once | ORAL | Status: AC
Start: 2017-05-02 — End: 2017-05-02
  Administered 2017-05-02: 25 mg via ORAL
  Filled 2017-05-02: qty 1

## 2017-05-02 MED ORDER — GABAPENTIN 100 MG PO CAPS
200.0000 mg | ORAL_CAPSULE | Freq: Every day | ORAL | Status: DC
  Administered 2017-05-02: 200 mg via ORAL
  Filled 2017-05-02: qty 2

## 2017-05-02 MED ORDER — POTASSIUM CHLORIDE CRYS ER 20 MEQ PO TBCR
20.0000 meq | EXTENDED_RELEASE_TABLET | Freq: Every day | ORAL | Status: DC
  Administered 2017-05-03 – 2017-05-12 (×10): 20 meq via ORAL
  Filled 2017-05-02 (×10): qty 1

## 2017-05-02 NOTE — Progress Notes (Signed)
Subjective/Complaints: Complains of ongoing pain in her right leg and particularly in the foot.  No symptoms in the arm or hand.  ROS: Patient denies fever, rash, sore throat, blurred vision, nausea, vomiting, diarrhea, cough, shortness of breath or chest pain,   back pain, headache, or mood change.    Objective: Vital Signs: Blood pressure (!) 167/107, pulse (!) 116, temperature 98.7 F (37.1 C), temperature source Oral, resp. rate 20, height 5' (1.524 m), weight 114.9 kg (253 lb 4.9 oz), SpO2 98 %. No results found. Results for orders placed or performed during the hospital encounter of 04/29/17 (from the past 72 hour(s))  Renal function panel     Status: Abnormal   Collection Time: 04/29/17  5:03 PM  Result Value Ref Range   Sodium 139 135 - 145 mmol/L   Potassium 3.3 (L) 3.5 - 5.1 mmol/L   Chloride 101 101 - 111 mmol/L   CO2 26 22 - 32 mmol/L   Glucose, Bld 88 65 - 99 mg/dL   BUN 46 (H) 6 - 20 mg/dL   Creatinine, Ser 7.56 (H) 0.44 - 1.00 mg/dL   Calcium 9.4 8.9 - 10.3 mg/dL   Phosphorus 4.7 (H) 2.5 - 4.6 mg/dL   Albumin 2.4 (L) 3.5 - 5.0 g/dL   GFR calc non Af Amer 6 (L) >60 mL/min   GFR calc Af Amer 7 (L) >60 mL/min    Comment: (NOTE) The eGFR has been calculated using the CKD EPI equation. This calculation has not been validated in all clinical situations. eGFR's persistently <60 mL/min signify possible Chronic Kidney Disease.    Anion gap 12 5 - 15    Comment: Performed at [facility] [address] [place], [place] [address]  CBC     Status: Abnormal   Collection Time: 04/29/17  5:30 PM  Result Value Ref Range   WBC 8.8 4.0 - 10.5 K/uL   RBC 3.35 (L) 3.87 - 5.11 MIL/uL   Hemoglobin 9.8 (L) 12.0 - 15.0 g/dL   HCT 30.8 (L) 36.0 - 46.0 %   MCV 91.9 78.0 - 100.0 fL   MCH 29.3 26.0 - 34.0 pg   MCHC 31.8 30.0 - 36.0 g/dL   RDW 14.7 11.5 - 15.5 %   Platelets 331 150 - 400 K/uL    Comment: Performed at [facility], [place] [address] [place], [place]  [address]  CBC WITH DIFFERENTIAL     Status: Abnormal   Collection Time: 04/30/17  3:21 AM  Result Value Ref Range   WBC 8.6 4.0 - 10.5 K/uL   RBC 3.29 (L) 3.87 - 5.11 MIL/uL   Hemoglobin 9.5 (L) 12.0 - 15.0 g/dL   HCT 30.4 (L) 36.0 - 46.0 %   MCV 92.4 78.0 - 100.0 fL   MCH 28.9 26.0 - 34.0 pg   MCHC 31.3 30.0 - 36.0 g/dL   RDW 14.8 11.5 - 15.5 %   Platelets 323 150 - 400 K/uL   Neutrophils Relative % 56 %   Neutro Abs 4.7 1.7 - 7.7 K/uL   Lymphocytes Relative 33 %   Lymphs Abs 2.9 0.7 - 4.0 K/uL   Monocytes Relative 7 %   Monocytes Absolute 0.6 0.1 - 1.0 K/uL   Eosinophils Relative 4 %   Eosinophils Absolute 0.4 0.0 - 0.7 K/uL   Basophils Relative 0 %   Basophils Absolute 0.0 0.0 - 0.1 K/uL    Comment: Performed at [facility]  [address], [place], [place] [address]  Comprehensive metabolic panel     Status: Abnormal   Collection Time: 04/30/17  3:21 AM  Result Value Ref Range   Sodium 140 135 - 145 mmol/L   Potassium 3.1 (L) 3.5 - 5.1 mmol/L   Chloride 101 101 - 111 mmol/L   CO2 25 22 - 32 mmol/L   Glucose, Bld 112 (H) 65 - 99 mg/dL   BUN 45 (H) 6 - 20 mg/dL   Creatinine, Ser 7.15 (H) 0.44 - 1.00 mg/dL   Calcium 9.2 8.9 - 10.3 mg/dL   Total Protein 6.1 (L) 6.5 - 8.1 g/dL   Albumin 2.3 (L) 3.5 - 5.0 g/dL   AST 27 15 - 41 U/L   ALT 13 (L) 14 - 54 U/L   Alkaline Phosphatase 89 38 - 126 U/L   Total Bilirubin 0.7 0.3 - 1.2 mg/dL   GFR calc non Af Amer 6 (L) >60 mL/min   GFR calc Af Amer 7 (L) >60 mL/min    Comment: (NOTE) The eGFR has been calculated using the CKD EPI equation. This calculation has not been validated in all clinical situations. eGFR's persistently <60 mL/min signify possible Chronic Kidney Disease.    Anion gap 14 5 - 15    Comment: Performed at  [facility] [address], [place], [place] [address]  Renal function panel     Status: Abnormal   Collection Time: 05/01/17  4:18 AM  Result Value Ref Range   Sodium 141 135 - 145 mmol/L    Potassium 3.3 (L) 3.5 - 5.1 mmol/L   Chloride 104 101 - 111 mmol/L   CO2 27 22 - 32 mmol/L   Glucose, Bld 102 (H) 65 - 99 mg/dL   BUN 40 (H) 6 - 20 mg/dL   Creatinine, Ser 5.91 (H) 0.44 - 1.00 mg/dL   Calcium 9.0 8.9 - 10.3 mg/dL   Phosphorus 4.9 (H) 2.5 - 4.6 mg/dL   Albumin 2.4 (L) 3.5 - 5.0 g/dL   GFR calc non Af Amer 8 (L) >60 mL/min   GFR calc Af Amer 9 (L) >60 mL/min    Comment: (NOTE) The eGFR has been calculated using the CKD EPI equation. This calculation has not been validated in all clinical situations. eGFR's persistently <60 mL/min signify possible Chronic Kidney Disease.    Anion gap 10 5 - 15    Comment: Performed at [facility] [address], [place],  [address]  Renal function panel     Status: Abnormal   Collection Time: 05/02/17  5:13 AM  Result Value Ref Range   Sodium 141 135 - 145 mmol/L   Potassium 3.2 (L) 3.5 - 5.1 mmol/L   Chloride 102 101 - 111 mmol/L   CO2 27 22 - 32 mmol/L   Glucose, Bld 104 (H) 65 - 99 mg/dL   BUN 34 (H) 6 - 20 mg/dL   Creatinine, Ser 4.32 (H) 0.44 - 1.00 mg/dL   Calcium 8.9 8.9 - 10.3 mg/dL   Phosphorus 4.0 2.5 - 4.6 mg/dL   Albumin 2.4 (L) 3.5 - 5.0 g/dL   GFR calc non Af Amer 11 (L) >60 mL/min   GFR calc Af Amer 13 (L) >60 mL/min    Comment: (NOTE) The eGFR has been calculated using the CKD EPI equation. This calculation has not been validated in all clinical situations. eGFR's persistently <60 mL/min signify possible Chronic Kidney Disease.    Anion gap 12 5 - 15  Comment: Performed at [facility], [place] [address] [place], [place] [address]     Constitutional: No distress . Vital signs reviewed.  Obese HEENT: EOMI, oral membranes moist Cardiovascular: tachy without murmur. No JVD    Respiratory: CTA Bilaterally without wheezes or rales. Normal effort    GI: BS +, non-tender, non-distended   Extremity:  Pulses positive and No Edema Skin:   Intact Neuro: Alert/Oriented, Abnormal Sensory Reduced  sensation dorsum of the left foot as well as little toe. and Abnormal Motor 0 at the right ankle dorsiflexor plantar flexor knee extensor and hip flexor are 4/55/5 bilateral upper limbs and 5/5 in left lower limb. Weak with HAB also on right.  Musc/Skel:  Swelling Dorsum of the right foot and ankle.  No pain with range of motion Psych: Pleasant and cooperative   Assessment/Plan: 1. Functional deficits secondary to rhabdomyolysis, right foot drop which require 3+ hours per day of interdisciplinary therapy in a comprehensive inpatient rehab setting. Physiatrist is providing close team supervision and 24 hour management of active medical problems listed below. Physiatrist and rehab team continue to assess barriers to discharge/monitor patient progress toward functional and medical goals. FIM: Function - Bathing Position: Wheelchair/chair at sink Body parts bathed by patient: Left arm, Chest, Right arm, Buttocks, Right upper leg, Left upper leg, Abdomen, Front perineal area Body parts bathed by helper: Right lower leg, Back, Left lower leg Assist Level: Touching or steadying assistance(Pt > 75%)  Function- Upper Body Dressing/Undressing What is the patient wearing?: Pull over shirt/dress Pull over shirt/dress - Perfomed by patient: Thread/unthread right sleeve, Thread/unthread left sleeve, Put head through opening, Pull shirt over trunk Assist Level: Supervision or verbal cues Function - Lower Body Dressing/Undressing What is the patient wearing?: Pants, Non-skid slipper socks Position: Wheelchair/chair at sink Pants- Performed by patient: Pull pants up/down Pants- Performed by helper: Thread/unthread right pants leg, Thread/unthread left pants leg Non-skid slipper socks- Performed by helper: Don/doff right sock, Don/doff left sock Assist for footwear: Maximal assist(CAM boot) Assist for lower body dressing: Touching or steadying assistance (Pt > 75%)  Function - Toileting Toileting steps  completed by patient: Adjust clothing prior to toileting, Performs perineal hygiene, Adjust clothing after toileting Toileting steps completed by helper: Adjust clothing after toileting Assist level: Touching or steadying assistance (Pt.75%)  Function - Air cabin crew transfer assistive device: Grab bar Assist level to toilet: Touching or steadying assistance (Pt > 75%) Assist level from toilet: Touching or steadying assistance (Pt > 75%)  Function - Chair/bed transfer Chair/bed transfer method: Stand pivot Chair/bed transfer assist level: Touching or steadying assistance (Pt > 75%) Chair/bed transfer assistive device: Armrests Chair/bed transfer details: Verbal cues for safe use of DME/AE, Manual facilitation for weight shifting, Verbal cues for precautions/safety, Verbal cues for technique  Function - Locomotion: Wheelchair Will patient use wheelchair at discharge?: (TBD) Type: Manual Max wheelchair distance: 150' Assist Level: Supervision or verbal cues Assist Level: Supervision or verbal cues Assist Level: Supervision or verbal cues Turns around,maneuvers to table,bed, and toilet,negotiates 3% grade,maneuvers on rugs and over doorsills: No Function - Locomotion: Ambulation Assistive device: Walker-rolling Max distance: 25' Assist level: Touching or steadying assistance (Pt > 75%) Assist level: Touching or steadying assistance (Pt > 75%) Walk 50 feet with 2 turns activity did not occur: Safety/medical concerns Walk 150 feet activity did not occur: Safety/medical concerns Walk 10 feet on uneven surfaces activity did not occur: Safety/medical concerns  Function - Comprehension Comprehension: Auditory Comprehension assist level: Follows  basic conversation/direction with no assist  Function - Expression Expression: Verbal Expression assist level: Expresses basic needs/ideas: With no assist  Function - Social Interaction Social Interaction assist level: Interacts  appropriately with others with medication or extra time (anti-anxiety, antidepressant).  Function - Problem Solving Problem solving assist level: Solves complex 90% of the time/cues < 10% of the time  Function - Memory Memory assist level: More than reasonable amount of time Patient normally able to recall (first 3 days only): Current season, Staff names and faces, That he or she is in a hospital  Medical Problem List and Plan: 1.Functional and mobility deficitssecondary to rhabdomyolysis and encephalopathy. Pt with right foot drop and dysesthesias----?sciatic nerve injury due to prolonged down time? Do not see consistent signs of RUE sensory loss. -CIR PT, OT, SLP -We will likely need AFO  2. DVT Prophylaxis/Anticoagulation: Pharmaceutical:Heparin 3. Pain Management:Fentanyl patch with tramadol prn -Increase gabapentin to 200 mg nightly 4. Mood:LCSW to follow for evaluation and support. 5. Neuropsych: This patientiscapable of making decisions on herown behalf. 6. Skin/Wound Care:Pressure relief measures. 7. Fluids/Electrolytes/Nutrition:Strict I/O with daily weights.     -Await today's labs   -Replace potassium   -We will liberalize diet per patient request 8. Rhabdomyolysis with acute renal failure: Continue to monitor UOP as well as labs for recovery. SCr trending downwards 11.3-->4.32 today 9. Leucocytosis: Resolved WBC 8.6.  10. Anemia: Will monitor with serial checks. H/H stable in 9-10 range.  11. Abnormal LFTs: Normalizing 12. Schizoaffective/depressive disorder: Continue Clozaril, Sinequan, Seroquel and Trintellix at bedtime.  13. HTN: Poorly controlled. Will monitor BP bid. Currently on Metoprolol bid. 3/29 started amlodipine per renal notes.  Increase metoprolol to 100 mg twice daily which should help heart rate also Vitals:   05/01/17 1300 05/02/17 0538  BP: (!) 148/98 (!) 167/107  Pulse: (!) 101 (!) 116  Resp: 20 20   Temp: 98.9 F (37.2 C) 98.7 F (37.1 C)  SpO2: 95% 98%    14.  Hypokalemia, increase potassium to 20 mEq daily LOS (Days) 3 A FACE TO FACE EVALUATION WAS PERFORMED  Meredith Staggers 05/02/2017, 8:43 AM

## 2017-05-02 NOTE — Progress Notes (Signed)
Physical Therapy Session Note  Patient Details  Name: Jenna Hill MRN: [id number] Date of Birth: 10/07/1970  Today's Date: 05/02/2017 PT Individual Time: 1445-1555 PT Individual Time Calculation (min): 70 min   Short Term Goals: Week 1:  PT Short Term Goal 1 (Week 1): Pt will ambulate 51' w/ supervision using LRAD PT Short Term Goal 2 (Week 1): Pt will self-propel w/c in household environment w/ supervision and manage w/c parts w/o cues  PT Short Term Goal 3 (Week 1): Pt will tolerate 30 min of OOB activity w/o increase in fatigue PT Short Term Goal 4 (Week 1): Pt will maintain dynamic standing balance w/ supervision   Skilled Therapeutic Interventions/Progress Updates:    c/o 8/10 pain throughout session, reports premedicated.  Session focus on activity tolerance and LE strengthening.    Pt transfers throughout session with RW and stand/step transfer with supervision.  Short distance gait up to 10' for transfers with RW.  W/C propulsion throughout unit, max distance 150' with supervision and increased time to negotiate obstacles.  Pt completes 2x10 reps LAQ, hip flexion, hip abduction with green theraband, chest press (green TB), and shoulder flexion (green TB).  Rest breaks provided as needed.  Pt completes 10 minutes on nustep at level 4 for gentle AAROM and cardiovascular endurance.  Gait from door back to bed at end of session, PT assisted with removing shoe/CAM boot.  Sit>supine with supervision.  Pt positioned to comfort with call bell in reach and needs met.   Therapy Documentation Precautions:  Precautions Precautions: Fall Precaution Comments: No pf/df at R Foot. Required Braces or Orthoses: Other Brace/Splint Other Brace/Splint: R CAM BOOT OOB Restrictions Weight Bearing Restrictions: No   See Function Navigator for Current Functional Status.   Therapy/Group: Individual Therapy  Michel Santee 05/02/2017, 4:06 PM

## 2017-05-02 NOTE — Progress Notes (Signed)
Inpatient Rehabilitation Center Individual Statement of Services  Patient Name:  Jenna Hill  Date:  05/02/2017  Welcome to the [facility].  Our goal is to provide you with an individualized program based on your diagnosis and situation, designed to meet your specific needs.  With this comprehensive rehabilitation program, you will be expected to participate in at least 3 hours of rehabilitation therapies Monday-Friday, with modified therapy programming on the weekends.  Your rehabilitation program will include the following services:  Physical Therapy (PT), Occupational Therapy (OT), 24 hour per day rehabilitation nursing, Neuropsychology, Case Management (Social Worker), Rehabilitation Medicine, Nutrition Services and Pharmacy Services  Weekly team conferences will be held on Tuesdays to discuss your progress.  Your Social Worker will talk with you frequently to get your input and to update you on team discussions.  Team conferences with you and your family in attendance may also be held.  Expected length of stay:  12 to 14 days  Overall anticipated outcome:  Modified Independent with supervision for car transfers and minimal assistance for stairs  Depending on your progress and recovery, your program may change. Your Social Worker will coordinate services and will keep you informed of any changes. Your Social Worker's name and contact numbers are listed  below.  The following services may also be recommended but are not provided by the [facility] will be made to provide these services after discharge if needed.  Arrangements include referral to agencies that provide these services.  Your insurance has been verified to be:  Medicaid Your primary doctor is:  Fredia Beets, FNP  Pertinent information will be shared with your doctor  and your insurance company.  Social Worker:  Alfonse Alpers, LCSW  [phone] or ([phone]  Information discussed with and copy given to patient by: Trey Sailors, 05/02/2017, 12:09 PM

## 2017-05-02 NOTE — Progress Notes (Addendum)
Occupational Therapy Session Note  Patient Details  Name: Jenna Hill MRN: [id number] Date of Birth: 1970-07-09  Today's Date: 05/02/2017 OT Individual Time: 1300-1359 OT Individual Time Calculation (min): 59 min   Short Term Goals: Week 1:  OT Short Term Goal 1 (Week 1): Pt will complete shower transfer with min A using shower bench  OT Short Term Goal 2 (Week 1): Pt will complete LB dressing with use of AE with min A  OT Short Term Goal 3 (Week 1): Pt will tolerate standing for 5 minutes for grooming tasks  OT Short Term Goal 4 (Week 1): Pt will complete toilet transfers with walker with min guard A  Skilled Therapeutic Interventions/Progress Updates:    Pt greeted EOB with no c/o pain. ADL needs met. Tx focus on dynamic sitting balance and AE training for LB dressing. We explored various adaptive techniques and used reacher/sock aide, with pt eventually able to don/doff footwear bilateral LE. Also able to don/doff CAM boot using adaptive footstool as needed with instruction! She required 2 rest breaks due to fatigue, but actively worked throughout session. Pt left with all needs within reach and bed alarm set.     Therapy Documentation Precautions:  Precautions Precautions: Fall Precaution Comments: No pf/df at R Foot. Required Braces or Orthoses: Other Brace/Splint Other Brace/Splint: R CAM BOOT OOB Restrictions Weight Bearing Restrictions: No Vital Signs: Therapy Vitals Temp: 98.7 F (37.1 C) Temp Source: Oral Pulse Rate: (!) 107 Resp: (!) 24(nurse notified) BP: (!) 176/114(nurse notified) Patient Position (if appropriate): Lying Oxygen Therapy SpO2: 93 % O2 Device: Room Air Pain Assessment Pain Scale: 0-10 Pain Score: 0-No pain ADL: ADL ADL Comments: see functional navigator     See Function Navigator for Current Functional Status.   Therapy/Group: Individual Therapy  Kavion Mancinas A Zahniya Zellars 05/02/2017, 4:05 PM

## 2017-05-02 NOTE — Progress Notes (Signed)
Physical Therapy Session Note  Patient Details  Name: Jenna Hill MRN: [id number] Date of Birth: October 21, 1970  Today's Date: 05/02/2017 PT Individual Time: 6147-0929 PT Individual Time Calculation (min): 60 min   Short Term Goals: Week 1:  PT Short Term Goal 1 (Week 1): Pt will ambulate 55' w/ supervision using LRAD PT Short Term Goal 2 (Week 1): Pt will self-propel w/c in household environment w/ supervision and manage w/c parts w/o cues  PT Short Term Goal 3 (Week 1): Pt will tolerate 30 min of OOB activity w/o increase in fatigue PT Short Term Goal 4 (Week 1): Pt will maintain dynamic standing balance w/ supervision   Skilled Therapeutic Interventions/Progress Updates:    c/o 9/10 pain at rest but reports being premedicated and agreeable to therapy session.  Session focus on transfers, ambulation, and activity tolerance.  Pt transitions to EOB with supervision using bed features.  Stand/pivot throughout session with RW and supervision, verbal cues for hand placement with no carryover throughout session.  Pt dressed from EOB with assist to thread pants over RLE only.  Sit<>stand for pulling pants over hips.  Pt propelled w/c to therapy gym with UEs for strengthening and mobility.  Gait training 2x40' with RW and close supervision/min guard with seated rest break in between.  Pt returned to room at end of session and transitioned to [facility] for toileting, NT aware of pt position.  Call bell in reach.   Therapy Documentation Precautions:  Precautions Precautions: Fall Precaution Comments: No pf/df at R Foot. Required Braces or Orthoses: Other Brace/Splint Other Brace/Splint: R CAM BOOT OOB Restrictions Weight Bearing Restrictions: No   See Function Navigator for Current Functional Status.   Therapy/Group: Individual Therapy  Michel Santee 05/02/2017, 12:23 PM

## 2017-05-02 NOTE — IPOC Note (Signed)
Overall Plan of Care [facility]) Patient Details Name: Jenna Hill MRN: [id number] DOB: 03/20/1970  Admitting Diagnosis: Rhabdomyolysis  Hospital Problems: Principal Problem:   Rhabdomyolysis Active Problems:   Schizoaffective disorder, depressive type ([place])   Acute encephalopathy   Injury of right sciatic nerve     Functional Problem List: Nursing Behavior, Safety, Perception  PT Balance, Endurance, Edema, Motor, Pain, Safety, Sensory  OT Balance, Endurance, Sensory, Safety, Pain, Edema  SLP    TR         Basic ADL's: OT Grooming, Dressing, Toileting, Bathing, Eating     Advanced  ADL's: OT Simple Meal Preparation     Transfers: PT Floor, Furniture, Car, Bed to Chair, [employer], Tub/Shower     Locomotion: PT Ambulation, Emergency planning/management officer, Stairs     Additional Impairments: OT    SLP        TR      Anticipated Outcomes Item Anticipated Outcome  Self Feeding mod I  Swallowing      Basic self-care  mod I  Toileting  mod I    Bathroom Transfers mod I   Bowel/Bladder  pt will maintain continence during inpatient stay  Transfers  Mod I   Locomotion  Mod I gait  Communication     Cognition     Pain  patient will maintain pain level less than 3  Safety/Judgment  patient will utilize call bell before getting up   Therapy Plan: PT Intensity: Minimum of 1-2 x/day ,45 to 90 minutes PT Frequency: 5 out of 7 days PT Duration Estimated Length of Stay: 12-14 days OT Intensity: Minimum of 1-2 x/day, 45 to 90 minutes OT Frequency: 5 out of 7 days OT Duration/Estimated Length of Stay: 12-24 days       Team Interventions: Nursing Interventions Psychosocial Support  PT interventions Visual/perceptual remediation/compensation, Stair training, Pain management, Disease management/prevention, Ambulation/gait training, Training and development officer, DME/adaptive equipment instruction, Patient/family education, Wheelchair propulsion/positioning,  Therapeutic Exercise, Psychosocial support, Functional electrical stimulation, Therapeutic Activities, Cognitive remediation/compensation, Community reintegration, Functional mobility training, Skin care/wound management, UE/LE Strength taining/ROM, Splinting/orthotics, UE/LE Coordination activities, Neuromuscular re-education, Discharge planning  OT Interventions Training and development officer, Community reintegration, Self Care/advanced ADL retraining, Therapeutic Exercise, UE/LE Coordination activities, UE/LE Strength taining/ROM, Therapeutic Activities, Functional mobility training, Pain management, DME/adaptive equipment instruction, Discharge planning, Cognitive remediation/compensation  SLP Interventions    TR Interventions    SW/CM Interventions Discharge Planning, Psychosocial Support, Patient/Family Education   Barriers to Discharge MD  Behavior  Nursing Medical stability    PT Decreased caregiver support, Lack of/limited family support Friends/family only available PRN  OT Inaccessible home environment, Decreased caregiver support lives alone in apartment  SLP      SW       Team Discharge Planning: Destination: PT-Home ,OT- Home , SLP-  Projected Follow-up: PT-Home health PT, OT-  Home health OT, SLP-  Projected Equipment Needs: PT-To be determined, OT- Rolling walker with 5" wheels, To be determined, SLP-  Equipment Details: PT- , OT-  Patient/family involved in discharge planning: PT- Patient,  OT-Patient, SLP-   MD ELOS: 14 days Medical Rehab Prognosis:  Excellent Assessment: The patient has been admitted for CIR therapies with the diagnosis of rhabdomyolysis. The team will be addressing functional mobility, strength, stamina, balance, safety, adaptive techniques and equipment, self-care, bowel and bladder mgt, patient and caregiver education, pain mgt, NMR, orthotics, pain control, ego support . Goals have been set at mod I with mobility and self-care.    Celesta Gentile.  Naaman Plummer,  MD, [facility]      See Team Conference Notes for weekly updates to the plan of care

## 2017-05-02 NOTE — Progress Notes (Signed)
Social Work Assessment and Plan  Patient Details  Name: Jenna Hill MRN: [id number] Date of Birth: [birth year]  Today's Date: 05/02/2017  Problem List:  Patient Active Problem List   Diagnosis Date Noted  . 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 AKI (acute kidney injury) ([place]) 04/25/2017  . Shock liver 04/10/2017  . Rhabdomyolysis 04/10/2017  . Bipolar 1 disorder ([place]) 04/10/2017  . Acute respiratory failure ([place])   . Acute encephalopathy 04/05/2017  . Pituitary microadenoma ([place]) 04/25/2015  . OSA (obstructive sleep apnea) 04/16/2015  . Obesity 04/16/2015  . Schizoaffective disorder, depressive type ([place]) 04/15/2015  . Hypertension 10/05/2012   Past Medical History:  Past Medical History:  Diagnosis Date  . Bipolar 1 disorder ([place])   . Difficult intubation   . Hypertension   . PONV (postoperative nausea and vomiting)   . Schizophrenia ([place])   . Spinal headache    Past Surgical History:  Past Surgical History:  Procedure Laterality Date  . ANKLE ARTHROSCOPY Right   . CESAREAN SECTION    . HAND SURGERY Right   . ORIF ANKLE FRACTURE Left 01/29/2016   Procedure: OPEN REDUCTION INTERNAL FIXATION LEFT ANKLE BIMALLEOLAR FRACTURE AND SYNDESMOSIS;  Surgeon: Wylene Simmer, MD;  Location: [place];  Service: Orthopedics;  Laterality: Left;   Social History:  reports that she has never smoked. She has never used smokeless tobacco. She reports that she drinks alcohol. She reports that she does not use drugs.  Family / Support Systems Marital Status: Divorced How Long?: unsure, but pt was married for 9 years Patient Roles: Other (Comment)(dtr, sister, friend) Other Supports: Jenna Hill - mother - [phone] home [phone] mobile; Jenna Hill - friend - [phone]; Jenna Hill - friend - [phone] Anticipated Caregiver: friends prn Ability/Limitations of Caregiver: friends work, so intermittent assist only, but she feels they can do  what she needs them to Caregiver Availability: Intermittent Family Dynamics: supportive family and friends  Social History Preferred language: English Religion: Baptist Education: CNA Read: Yes Write: Yes Employment Status: Disabled(Pt was a Engineer, 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   Abuse/Neglect Abuse/Neglect Assessment Can Be Completed: Yes Physical Abuse: Denies Verbal Abuse: Denies Sexual Abuse: Denies Exploitation of patient/patient's resources: Denies Self-Neglect: Denies  Emotional Status Pt's affect, behavior and adjustment status: Pt was pleasant with CSW and reports feeling okay emotionally.  She is grateful she survived this event.   Recent Psychosocial Issues: none reported Psychiatric History: Pt with extensive psychiatric history of schizophrenia and bipolar disorder.  She has good f/u in the community with psychiatry (Ms. Jenna Hill, APMHNP-BC) and counselor (MS. Jenna Ouch, LCSW) both at [place]. Substance Abuse History: none reported  Patient / Family Perceptions, Expectations & Goals Pt/Family understanding of illness & functional limitations: Pt expressed a good understanding of her condition.  She is frustrated with the swelling in her right leg and feels it limits her.  She is unsure what caused her to get sick at home in the first place and her friend found her on the floor. Premorbid pt/family roles/activities: Pt just messes around the house.  She likes to read, but the voices she hears keep her from doing that sometimes, so she changes activities.  Keeping music or the TV on helps her to be distracted from the voices, but she  is not threatened or scared by them. Anticipated changes in roles/activities/participation: Pt plans to resume  activities as she was doing before, but she will not be able to drive for a while.  CSW talked with her about calling DSS worker and getting on Medicaid transportation or asking friends to help her. Pt/family expectations/goals: Pt wants to get her independence back and to get home.  Community Resources [employer]: Other (Comment)(DSS, [facility]; [place]; Food stamps) Premorbid Home Care/DME Agencies: None Transportation available at discharge: friend Resource referrals recommended: Neuropsychology  Discharge Planning Living Arrangements: Alone Support Systems: Friends/neighbors, Other relatives, Parent Type of Residence: Private residence Insurance Resources: Medicaid (specify county)([place]) Financial Resources: [employer] Screen Referred: No Living Expenses: Education officer, community Management: Patient Does the patient have any problems obtaining your medications?: No Home Management: Pt was doing this. Patient/Family Preliminary Plans: Pt plans to return to her home and have her friends assist intermittently. Social Work Anticipated Follow Up Needs: HH/OP Expected length of stay: 12 to 14 days  Clinical Impression CSW met pt to introduce self and role of CSW, as well as to complete assessment.  Pt was open with CSW and shared about her schizophrenia, which she has had since she was 47 y/o, but it got worse and she had to quit working about 9 or 10 years ago. CSW talked with pt about how she Hill with hearing the voices so often and she listens to music or has TV on in the background.  She has good family support, but they live in [place] (brother and mother). She has a good friend here who will help with what she needs.  Pt will need assistance with errands and getting to appointments since she will not be driving.   CSW suggested Medicaid transportation and/or SCAT.  CSW will f/u with pt on this.  Pt is glad to be on CIR and wants to  get moving and get rid of swelling in her right leg and to be independent at home.  CSW will continue to follow and assist as needed.  Jenna Hill, Jenna Hill 05/02/2017, 1:58 PM

## 2017-05-03 ENCOUNTER — Inpatient Hospital Stay (HOSPITAL_COMMUNITY): Payer: Medicaid Other | Admitting: Occupational Therapy

## 2017-05-03 ENCOUNTER — Inpatient Hospital Stay (HOSPITAL_COMMUNITY): Payer: Self-pay | Admitting: Physical Therapy

## 2017-05-03 ENCOUNTER — Inpatient Hospital Stay (HOSPITAL_COMMUNITY): Payer: Self-pay

## 2017-05-03 LAB — RENAL FUNCTION PANEL
ANION GAP: 13 (ref 5–15)
Albumin: 2.7 g/dL — ABNORMAL LOW (ref 3.5–5.0)
BUN: 31 mg/dL — ABNORMAL HIGH (ref 6–20)
CO2: 26 mmol/L (ref 22–32)
Calcium: 8.9 mg/dL (ref 8.9–10.3)
Chloride: 101 mmol/L (ref 101–111)
Creatinine, Ser: 3.51 mg/dL — ABNORMAL HIGH (ref 0.44–1.00)
GFR calc Af Amer: 17 mL/min — ABNORMAL LOW (ref >60)
GFR calc non Af Amer: 15 mL/min — ABNORMAL LOW (ref >60)
GLUCOSE: 109 mg/dL — AB (ref 65–99)
POTASSIUM: 3.3 mmol/L — AB (ref 3.5–5.1)
Phosphorus: 3.8 mg/dL (ref 2.5–4.6)
Sodium: 140 mmol/L (ref 135–145)

## 2017-05-03 LAB — CBC
HEMATOCRIT: 31.4 % — AB (ref 36.0–46.0)
HEMOGLOBIN: 10 g/dL — AB (ref 12.0–15.0)
MCH: 29.8 pg (ref 26.0–34.0)
MCHC: 31.8 g/dL (ref 30.0–36.0)
MCV: 93.5 fL (ref 78.0–100.0)
Platelets: 307 10*3/uL (ref 150–400)
RBC: 3.36 MIL/uL — ABNORMAL LOW (ref 3.87–5.11)
RDW: 15.2 % (ref 11.5–15.5)
WBC: 11.3 10*3/uL — ABNORMAL HIGH (ref 4.0–10.5)

## 2017-05-03 MED ORDER — GABAPENTIN 300 MG PO CAPS
300.0000 mg | ORAL_CAPSULE | Freq: Every day | ORAL | Status: DC
  Administered 2017-05-03 – 2017-05-04 (×2): 300 mg via ORAL
  Filled 2017-05-03 (×2): qty 1

## 2017-05-03 NOTE — Progress Notes (Signed)
Occupational Therapy Session Note  Patient Details  Name: Jenna Hill MRN: [id number] Date of Birth: [birth year]  Today's Date: 05/03/2017 OT Individual Time: 0900-1000 OT Individual Time Calculation (min): 60 min    Short Term Goals: Week 1:  OT Short Term Goal 1 (Week 1): Pt will complete shower transfer with min A using shower bench  OT Short Term Goal 2 (Week 1): Pt will complete LB dressing with use of AE with min A  OT Short Term Goal 3 (Week 1): Pt will tolerate standing for 5 minutes for grooming tasks  OT Short Term Goal 4 (Week 1): Pt will complete toilet transfers with walker with min guard A  Skilled Therapeutic Interventions/Progress Updates:    Pt engaged in BADL retraining including bathing/dressing with sit<>stand from w/c at sink.  Pt required min A for LB dressing tasks (shoe and CAM boot). Pt completed sit<>stand and functional amb with RW at steady A.  Pt requires more than a reasonable amount of time to complete all tasks.  Focus on activity tolerance, sit<>stand, standing balance, functional amb with RW, and safety awareness to increase independence with BADLs.   Therapy Documentation Precautions:  Precautions Precautions: Fall Precaution Comments: No pf/df at R Foot. Required Braces or Orthoses: Other Brace/Splint Other Brace/Splint: R CAM BOOT OOB Restrictions Weight Bearing Restrictions: No Pain: Pain Assessment Pain Scale: 0-10 Pain Score: 10-Worst pain ever Pain Type: Chronic pain Pain Location: Foot Pain Orientation: Right Pain Descriptors / Indicators: Stabbing Pain Frequency: Constant Pain Onset: On-going Patients Stated Pain Goal: 6 Pain Intervention(s): Medication (See eMAR)   Exercises:   Other Treatments:    See Function Navigator for Current Functional Status.   Therapy/Group: Individual Therapy  Leroy Libman 05/03/2017, 12:18 PM

## 2017-05-03 NOTE — Progress Notes (Signed)
Patient provided and educated om use of incentive spirometer. Reached 1000 mL initially, meter goal set there

## 2017-05-03 NOTE — Progress Notes (Signed)
Occupational Therapy Session Note  Patient Details  Name: Jenna Hill MRN: [id number] Date of Birth: [birth year]  Today's Date: 05/03/2017 OT Individual Time: 1350-1500 OT Individual Time Calculation (min): 70 min    Short Term Goals: Week 1:  OT Short Term Goal 1 (Week 1): Pt will complete shower transfer with min A using shower bench  OT Short Term Goal 2 (Week 1): Pt will complete LB dressing with use of AE with min A  OT Short Term Goal 3 (Week 1): Pt will tolerate standing for 5 minutes for grooming tasks  OT Short Term Goal 4 (Week 1): Pt will complete toilet transfers with walker with min guard A  Skilled Therapeutic Interventions/Progress Updates:    Treatment session with focus on functional mobility, household/bathroom transfers, and energy conservation.  Pt received supine in bed willing to engage in therapy session.  Pt reports need to toilet.  Ambulated ~5 feet with RW to w/c as she reports that she could not walk all the way to the bathroom.  Min guard during sit <> stand and short distance ambulation.  Pt completed toilet transfer and toileting with steady assist.  Pt propelled w/c to ADL apt for endurance and BUE strengthening.  Educated on tub/shower transfers with tub bench and RW.  Pt completed transfer x2 with increased time and min guard.  Discussed safety and recommendation for tub transfer bench.  Engaged in furniture transfers in ADL apt with pt requiring min assist from low couch surface.  Ambulated 10' x2 in ADL apt into kitchen.  Discussed energy conservation strategies for meal prep and kitchen tasks as pt easily fatigued during mobility and standing.  Pt reports increased fatigue, however pt able to propel w/c back to room with 1 rest break.  Returned to bed stand pivot min guard and left semi-reclined in bed with all needs in reach.  Therapy Documentation Precautions:  Precautions Precautions: Fall Precaution Comments: No pf/df at R Foot. Required Braces or  Orthoses: Other Brace/Splint Other Brace/Splint: R CAM BOOT OOB Restrictions Weight Bearing Restrictions: No General:   Vital Signs: Therapy Vitals Temp: 98.7 F (37.1 C) Temp Source: Oral Pulse Rate: 99 Resp: 20 BP: (!) 131/91 Patient Position (if appropriate): Lying Oxygen Therapy SpO2: 93 % O2 Device: Room Air Pain: Pain Assessment Pain Scale: 0-10 Pain Score: 4  Pain Type: Chronic pain Pain Location: Foot Pain Orientation: Right Pain Descriptors / Indicators: Stabbing Pain Frequency: Constant Pain Onset: On-going Patients Stated Pain Goal: 6 Pain Intervention(s): Medication (See eMAR)  See Function Navigator for Current Functional Status.   Therapy/Group: Individual Therapy  Simonne Come 05/03/2017, 3:26 PM

## 2017-05-03 NOTE — Progress Notes (Signed)
Physical Therapy Session Note  Patient Details  Name: Jenna Hill MRN: [id number] Date of Birth: 04/07/1970  Today's Date: 05/03/2017 PT Individual Time: 0803-0900 PT Individual Time Calculation (min): 57 min   Short Term Goals: Week 1:  PT Short Term Goal 1 (Week 1): Pt will ambulate 51' w/ supervision using LRAD PT Short Term Goal 2 (Week 1): Pt will self-propel w/c in household environment w/ supervision and manage w/c parts w/o cues  PT Short Term Goal 3 (Week 1): Pt will tolerate 30 min of OOB activity w/o increase in fatigue PT Short Term Goal 4 (Week 1): Pt will maintain dynamic standing balance w/ supervision   Skilled Therapeutic Interventions/Progress Updates: Pt presented in    bed asleep, responding to vocal stimuli however very lethargic when woken. Pt required several attempts to wake up and initially requiring some encouragement to participate in therapy. Pt stating significant pain in RLE 9/10, per pt received pain meds approx 6:45 this am. Pt contributing lethargy to pain meds. PTA donned CAM boot for time management. Pt performed supine to sit supervision with use of features and increased time. Performed stand pivot transfer no AD min guard with increased time and pt significantly guarding RLE. Pt requesting to use toilet, transported w/c to toilet and performed stand pivot to toilet with min guard and use of wall rail. Pt returned to sink after toileting to wash hands and nsg arrived to administer meds. Pt then propelled throughout unit >531f with supervision for UE strengthening and endurance. Pt required x 2 brief rests due to fatigue. Pt returned to room at end of session and remained in w/c with call bell within reach and needs met.   Therapy Documentation Precautions:  Precautions Precautions: Fall Precaution Comments: No pf/df at R Foot. Required Braces or Orthoses: Other Brace/Splint Other Brace/Splint: R CAM BOOT OOB Restrictions Weight Bearing Restrictions:  No General:   Vital Signs: Therapy Vitals Temp: 98.7 F (37.1 C) Temp Source: Oral Pulse Rate: 99 Resp: 20 BP: (!) 131/91 Patient Position (if appropriate): Lying Oxygen Therapy SpO2: 93 % O2 Device: Room Air Pain: Pain Assessment Pain Scale: 0-10 Pain Score: 0-No pain Pain Type: Chronic pain Pain Location: Foot Pain Orientation: Right Pain Descriptors / Indicators: Stabbing Pain Frequency: Constant Pain Onset: On-going Patients Stated Pain Goal: 6 Pain Intervention(s): Medication (See eMAR)  See Function Navigator for Current Functional Status.   Therapy/Group: Individual Therapy  [name]  [name], PTA  05/03/2017, 3:55 PM

## 2017-05-03 NOTE — Progress Notes (Signed)
Discussed labs trend with Dr. Marval Regal. He recommended removing IJ. He reports that he will follow at a distance.

## 2017-05-03 NOTE — Progress Notes (Signed)
Subjective/Complaints: States that she had a better day with therapy.  Still having pain in her right leg.  Was able to sleep last night.  ROS: Patient denies fever, rash, sore throat, blurred vision, nausea, vomiting, diarrhea, cough, shortness of breath or chest pain,   back pain, headache, or mood change.    Objective: Vital Signs: Blood pressure (!) 148/98, pulse (!) 104, temperature 98 F (36.7 C), temperature source Oral, resp. rate 20, height 5' (1.524 m), weight 111.9 kg (246 lb 11.1 oz), SpO2 95 %. No results found. Results for orders placed or performed during the hospital encounter of 04/29/17 (from the past 72 hour(s))  Renal function panel     Status: Abnormal   Collection Time: 05/01/17  4:18 AM  Result Value Ref Range   Sodium 141 135 - 145 mmol/L   Potassium 3.3 (L) 3.5 - 5.1 mmol/L   Chloride 104 101 - 111 mmol/L   CO2 27 22 - 32 mmol/L   Glucose, Bld 102 (H) 65 - 99 mg/dL   BUN 40 (H) 6 - 20 mg/dL   Creatinine, Ser 5.91 (H) 0.44 - 1.00 mg/dL   Calcium 9.0 8.9 - 10.3 mg/dL   Phosphorus 4.9 (H) 2.5 - 4.6 mg/dL   Albumin 2.4 (L) 3.5 - 5.0 g/dL   GFR calc non Af Amer 8 (L) >60 mL/min   GFR calc Af Amer 9 (L) >60 mL/min    Comment: (NOTE) The eGFR has been calculated using the CKD EPI equation. This calculation has not been validated in all clinical situations. eGFR's persistently <60 mL/min signify possible Chronic Kidney Disease.    Anion gap 10 5 - 15    Comment: Performed at [facility] [address], [place], [place] [address]  Renal function panel     Status: Abnormal   Collection Time: 05/02/17  5:13 AM  Result Value Ref Range   Sodium 141 135 - 145 mmol/L   Potassium 3.2 (L) 3.5 - 5.1 mmol/L   Chloride 102 101 - 111 mmol/L   CO2 27 22 - 32 mmol/L   Glucose, Bld 104 (H) 65 - 99 mg/dL   BUN 34 (H) 6 - 20 mg/dL   Creatinine, Ser 4.32 (H) 0.44 - 1.00 mg/dL   Calcium 8.9 8.9 - 10.3 mg/dL   Phosphorus 4.0 2.5 - 4.6 mg/dL   Albumin 2.4 (L) 3.5 -  5.0 g/dL   GFR calc non Af Amer 11 (L) >60 mL/min   GFR calc Af Amer 13 (L) >60 mL/min    Comment: (NOTE) The eGFR has been calculated using the CKD EPI equation. This calculation has not been validated in all clinical situations. eGFR's persistently <60 mL/min signify possible Chronic Kidney Disease.    Anion gap 12 5 - 15    Comment: Performed at [facility] [address] [place], [place] [address]  Renal function panel     Status: Abnormal   Collection Time: 05/03/17  4:24 AM  Result Value Ref Range   Sodium 140 135 - 145 mmol/L   Potassium 3.3 (L) 3.5 - 5.1 mmol/L   Chloride 101 101 - 111 mmol/L   CO2 26 22 - 32 mmol/L   Glucose, Bld 109 (H) 65 - 99 mg/dL   BUN 31 (H) 6 - 20 mg/dL   Creatinine, Ser 3.51 (H) 0.44 - 1.00 mg/dL   Calcium 8.9 8.9 - 10.3 mg/dL   Phosphorus 3.8 2.5 - 4.6 mg/dL   Albumin 2.7 (  L) 3.5 - 5.0 g/dL   GFR calc non Af Amer 15 (L) >60 mL/min   GFR calc Af Amer 17 (L) >60 mL/min    Comment: (NOTE) The eGFR has been calculated using the CKD EPI equation. This calculation has not been validated in all clinical situations. eGFR's persistently <60 mL/min signify possible Chronic Kidney Disease.    Anion gap 13 5 - 15    Comment: Performed at [facility] [address] [place], [place] [address]  CBC     Status: Abnormal   Collection Time: 05/03/17  4:24 AM  Result Value Ref Range   WBC 11.3 (H) 4.0 - 10.5 K/uL   RBC 3.36 (L) 3.87 - 5.11 MIL/uL   Hemoglobin 10.0 (L) 12.0 - 15.0 g/dL   HCT 31.4 (L) 36.0 - 46.0 %   MCV 93.5 78.0 - 100.0 fL   MCH 29.8 26.0 - 34.0 pg   MCHC 31.8 30.0 - 36.0 g/dL   RDW 15.2 11.5 - 15.5 %   Platelets 307 150 - 400 K/uL    Comment: Performed at [facility], [place] [address], [place], [place] [address]     Constitutional: No distress . Vital signs reviewed. HEENT: EOMI, oral membranes moist Cardiovascular: Slight tachycardia without murmur. No JVD    Respiratory: CTA Bilaterally without wheezes or  rales. Normal effort    GI: BS +, non-tender, non-distended    Extremity:  Pulses positive and No Edema Skin:   Intact Neuro: Alert/Oriented, Abnormal Sensory Reduced sensation dorsum of the left foot as well as little toe.  Motor testing remains 0 out of 5 at the right ankle dorsiflexor plantar flexor.  knee extensor and hip flexor are 4/55/5 bilateral upper limbs and 5/5 in left lower limb. Weak with HAB also on right.  Musc/Skel: Slight swelling Dorsum of the right foot and ankle.  Some pain with manipulation of the foot.   Psych: Pleasant and cooperative   Assessment/Plan: 1. Functional deficits secondary to rhabdomyolysis, right foot drop which require 3+ hours per day of interdisciplinary therapy in a comprehensive inpatient rehab setting. Physiatrist is providing close team supervision and 24 hour management of active medical problems listed below. Physiatrist and rehab team continue to assess barriers to discharge/monitor patient progress toward functional and medical goals. FIM: Function - Bathing Position: Wheelchair/chair at sink Body parts bathed by patient: Left arm, Chest, Right arm, Buttocks, Right upper leg, Left upper leg, Abdomen, Front perineal area Body parts bathed by helper: Right lower leg, Back, Left lower leg Assist Level: Touching or steadying assistance(Pt > 75%)  Function- Upper Body Dressing/Undressing What is the patient wearing?: Pull over shirt/dress Pull over shirt/dress - Perfomed by patient: Thread/unthread right sleeve, Thread/unthread left sleeve, Put head through opening, Pull shirt over trunk Assist Level: Supervision or verbal cues Function - Lower Body Dressing/Undressing What is the patient wearing?: Non-skid slipper socks, Shoes, Ted Hose, AFO Position: Sitting EOB Pants- Performed by patient: Pull pants up/down Pants- Performed by helper: Thread/unthread right pants leg, Thread/unthread left pants leg Non-skid slipper socks- Performed by  patient: Don/doff right sock, Don/doff left sock Non-skid slipper socks- Performed by helper: Don/doff right sock, Don/doff left sock Shoes - Performed by patient: Don/doff left shoe, Fasten left AFO - Performed by patient: Don/doff right AFO(CAM boot) TED Hose - Performed by patient: Don/doff left TED hose TED Hose - Performed by helper: Don/doff right TED hose Assist for footwear: Maximal assist(CAM boot) Assist for lower body dressing:  Supervision or verbal cues, Assistive device Assistive Device Comment: reacher + sock aide  Function - Toileting Toileting steps completed by patient: Adjust clothing prior to toileting, Performs perineal hygiene Toileting steps completed by helper: Adjust clothing after toileting Toileting Assistive Devices: Grab bar or rail Assist level: Touching or steadying assistance (Pt.75%)  Function - Toilet Transfers Toilet transfer assistive device: Grab bar Assist level to toilet: Maximal assist (Pt 25 - 49%/lift and lower)(per Brianna Collinson, NT report) Assist level from toilet: Touching or steadying assistance (Pt > 75%)  Function - Chair/bed transfer Chair/bed transfer method: Stand pivot Chair/bed transfer assist level: Touching or steadying assistance (Pt > 75%) Chair/bed transfer assistive device: Armrests Chair/bed transfer details: Verbal cues for safe use of DME/AE, Manual facilitation for weight shifting, Verbal cues for precautions/safety, Verbal cues for technique  Function - Locomotion: Wheelchair Will patient use wheelchair at discharge?: (TBD) Type: Manual Max wheelchair distance: 150' Assist Level: Supervision or verbal cues Assist Level: Supervision or verbal cues Assist Level: Supervision or verbal cues Turns around,maneuvers to table,bed, and toilet,negotiates 3% grade,maneuvers on rugs and over doorsills: No Function - Locomotion: Ambulation Assistive device: Walker-rolling Max distance: 25' Assist level: Touching or steadying  assistance (Pt > 75%) Assist level: Touching or steadying assistance (Pt > 75%) Walk 50 feet with 2 turns activity did not occur: Safety/medical concerns Walk 150 feet activity did not occur: Safety/medical concerns Walk 10 feet on uneven surfaces activity did not occur: Safety/medical concerns  Function - Comprehension Comprehension: Auditory Comprehension assist level: Follows basic conversation/direction with no assist  Function - Expression Expression: Verbal Expression assist level: Expresses basic needs/ideas: With no assist  Function - Social Interaction Social Interaction assist level: Interacts appropriately with others with medication or extra time (anti-anxiety, antidepressant).  Function - Problem Solving Problem solving assist level: Solves complex 90% of the time/cues < 10% of the time  Function - Memory Memory assist level: More than reasonable amount of time Patient normally able to recall (first 3 days only): Current season, Staff names and faces, That he or she is in a hospital  Medical Problem List and Plan: 1.Functional and mobility deficitssecondary to rhabdomyolysis and encephalopathy. Pt with right foot drop and dysesthesias----?sciatic nerve injury due to prolonged down time?  . -CIR PT, OT, SLP -Team conference today 2. DVT Prophylaxis/Anticoagulation: Pharmaceutical:Heparin 3. Pain Management:Fentanyl patch with tramadol prn -Increased gabapentin to 200 mg nightly 4. Mood:LCSW to follow for evaluation and support. 5. Neuropsych: This patientiscapable of making decisions on herown behalf. 6. Skin/Wound Care:Pressure relief measures. 7. Fluids/Electrolytes/Nutrition:Strict I/O with daily weights.     -Await today's labs   -Replace potassium   -We will liberalize diet per patient request 8. Rhabdomyolysis with acute renal failure: Continue to monitor UOP as well as labs for recovery. SCr trending  downwards 11.3-->4.32 > 3.51 today   -We will follow-up with nephrology regarding need for central line.  I would like to remove 9. Leucocytosis: White blood cell count up to 11.3 today.  10. Anemia: Will monitor with serial checks. H/H stable in 9-10 range.  11. Abnormal LFTs: Normalizing 12. Schizoaffective/depressive disorder: Continue Clozaril, Sinequan, Seroquel and Trintellix at bedtime.  13. HTN: Has been poorly controlled. Will monitor BP bid. Currently on Metoprolol bid. 3/29 started amlodipine .  Increased metoprolol to 100 mg twice daily on 4/1 Will likely need further titration of medications.  Observe today Vitals:   05/02/17 2300 05/03/17 0500  BP: (!) 142/94 (!) 148/98  Pulse: 92 (!) 104  Resp: 20 20  Temp:  98 F (36.7 C)  SpO2:  95%    14.  Hypokalemia, increased potassium to 20 mEq daily   LOS (Days) 4 A FACE TO FACE EVALUATION WAS PERFORMED  Meredith Staggers 05/03/2017, 8:31 AM

## 2017-05-04 ENCOUNTER — Inpatient Hospital Stay (HOSPITAL_COMMUNITY): Payer: Self-pay

## 2017-05-04 ENCOUNTER — Inpatient Hospital Stay (HOSPITAL_COMMUNITY): Payer: Self-pay | Admitting: Physical Therapy

## 2017-05-04 DIAGNOSIS — I10 Essential (primary) hypertension: Secondary | ICD-10-CM

## 2017-05-04 LAB — RENAL FUNCTION PANEL
ALBUMIN: 2.4 g/dL — AB (ref 3.5–5.0)
Anion gap: 10 (ref 5–15)
BUN: 26 mg/dL — ABNORMAL HIGH (ref 6–20)
CHLORIDE: 104 mmol/L (ref 101–111)
CO2: 25 mmol/L (ref 22–32)
CREATININE: 2.9 mg/dL — AB (ref 0.44–1.00)
Calcium: 8.8 mg/dL — ABNORMAL LOW (ref 8.9–10.3)
GFR, EST AFRICAN AMERICAN: 21 mL/min — AB (ref >60)
GFR, EST NON AFRICAN AMERICAN: 18 mL/min — AB (ref >60)
Glucose, Bld: 96 mg/dL (ref 65–99)
PHOSPHORUS: 4.1 mg/dL (ref 2.5–4.6)
POTASSIUM: 3.4 mmol/L — AB (ref 3.5–5.1)
Sodium: 139 mmol/L (ref 135–145)

## 2017-05-04 MED ORDER — AMLODIPINE BESYLATE 10 MG PO TABS
10.0000 mg | ORAL_TABLET | Freq: Every day | ORAL | Status: DC
  Administered 2017-05-04 – 2017-05-11 (×8): 10 mg via ORAL
  Filled 2017-05-04 (×8): qty 1

## 2017-05-04 NOTE — Progress Notes (Signed)
Occupational Therapy Note  Patient Details  Name: Jenna Hill MRN: [id number] Date of Birth: 12/01/1970  Today's Date: 05/04/2017 OT Individual Time: 1345-1425 OT Individual Time Calculation (min): 40 min   Pt denies pain Individual Therapy  OT intervention with focus on activity tolerance and dynamic standing balance to increase independence with BADLs. Pt propelled w/c to small gym with Dyn[employer] and engaged in dynamic standing balance using UE as support and without support.  Reaction time ranged between 1.19 seconds and 1.05 seconds when standing without UE support.  Pt attempted to stand on Airexc foam mat but pt became anxious and was unable to complete task.  Pt propelled back to room and remained in w/c with all needs within reach.    [name] [facility] 05/04/2017, 2:53 PM

## 2017-05-04 NOTE — Progress Notes (Signed)
Physical Therapy Session Note  Patient Details  Name: Jenna Hill MRN: [id number] Date of Birth: 09-12-1970  Today's Date: 05/04/2017 PT Individual Time: 0900-1000 PT Individual Time Calculation (min): 60 min   Short Term Goals: Week 1:  PT Short Term Goal 1 (Week 1): Pt will ambulate 8' w/ supervision using LRAD PT Short Term Goal 2 (Week 1): Pt will self-propel w/c in household environment w/ supervision and manage w/c parts w/o cues  PT Short Term Goal 3 (Week 1): Pt will tolerate 30 min of OOB activity w/o increase in fatigue PT Short Term Goal 4 (Week 1): Pt will maintain dynamic standing balance w/ supervision   Skilled Therapeutic Interventions/Progress Updates:    Pt semi-reclined in bed and asleep, is arousable but very lethargic and needs increased time to respond to verbal cues to bring herself to sitting EOB. Pt requires tactile cues to initiate semi-reclined to sitting EOB with use of bedrails and HOB elevated. Assisted pt with donning shoes and socks for time management. Standing balance with min assist and use of one UE on RW for balance while pulling up pants. Stand pivot transfer bed to w/c with min assist and use of RW. Ambulation x 25 ft with RW and min assist for balance, no AFO, foot drop on R. Trial ambulation with carbon fiber AFO for RLE DF assist, improved RLE clearance with use of AFO, use of RW and min assist for balance, however due to swelling in RLE pt has some pain due to decreased space in shoe for foot and AFO. Trial ambulation with "foot-up" brace for RLE, improved RLE clearance, use of RW and min assist for balance. Pt will benefit from AFO consult for determination of AFO she will require at d/c. Pt left seated in w/c in room with needs in reach for OT session.  Therapy Documentation Precautions:  Precautions Precautions: Fall Precaution Comments: No pf/df at R Foot. Required Braces or Orthoses:  Other Brace/Splint:  Restrictions Weight Bearing  Restrictions: No  See Function Navigator for Current Functional Status.   Therapy/Group: Individual Therapy   Excell Seltzer, PT, DPT  05/04/2017, 12:03 PM

## 2017-05-04 NOTE — Progress Notes (Signed)
Occupational Therapy Session Note  Patient Details  Name: Jenna Hill MRN: [id number] Date of Birth: September 19, 1970  Today's Date: 05/04/2017 OT Individual Time: 1000-1100 OT Individual Time Calculation (min): 60 min  Pt missed 15 mins 2/2 increased fatigue/lethargy   Short Term Goals: Week 1:  OT Short Term Goal 1 (Week 1): Pt will complete shower transfer with min A using shower bench  OT Short Term Goal 2 (Week 1): Pt will complete LB dressing with use of AE with min A  OT Short Term Goal 3 (Week 1): Pt will tolerate standing for 5 minutes for grooming tasks  OT Short Term Goal 4 (Week 1): Pt will complete toilet transfers with walker with min guard A  Skilled Therapeutic Interventions/Progress Updates:    Pt resting in w/c upon arrival.  Pt declined bathing/dressing tasks stating that she wanted to work on her "walking and exercises." Pt propelled to ADL apartment and engaged in funcitonal amb tasks with RW for simple home mgmt tasks.  Educated pt on home safety and energy conservation.  Discussed kitchen safety.  Pt engaged in dynamic standing tasks to perform table activities using BUE simultaneously.  Pt completed tasks with steady A.  Pt fatigues quickly and requires multiple rest breaks during session.  Pt propelled back to toom and remained in w/c with all needs within reach.   Therapy Documentation Precautions:  Precautions Precautions: Fall Precaution Comments: No pf/df at R Foot. Required Braces or Orthoses: Other Brace/Splint Other Brace/Splint: Restrictions Weight Bearing Restrictions: No Pain:  Pt denies pan   See Function Navigator for Current Functional Status.   Therapy/Group: Individual Therapy  Leroy Libman 05/04/2017, 12:04 PM

## 2017-05-04 NOTE — Plan of Care (Signed)
  Problem: Consults Goal: RH GENERAL PATIENT EDUCATION Description See Patient Education module for education specifics. Outcome: Progressing   Problem: RH BOWEL ELIMINATION Goal: RH STG MANAGE BOWEL WITH ASSISTANCE Description STG Manage Bowel with mod I  Assistance.  Outcome: Progressing   Problem: RH SKIN INTEGRITY Goal: RH STG SKIN FREE OF INFECTION/BREAKDOWN Description With min assist  Outcome: Progressing Goal: RH STG MAINTAIN SKIN INTEGRITY WITH ASSISTANCE Description STG Maintain Skin Integrity With min Assistance.  Outcome: Progressing   Problem: RH SAFETY Goal: RH STG DECREASED RISK OF FALL WITH ASSISTANCE Description STG Decreased Risk of Fall With  [employer].  Outcome: Progressing   Problem: RH PAIN MANAGEMENT Goal: RH STG PAIN MANAGED AT OR BELOW PT'S PAIN GOAL Description At or below level 6  Outcome: Progressing   Problem: RH KNOWLEDGE DEFICIT GENERAL Goal: RH STG INCREASE KNOWLEDGE OF SELF CARE AFTER HOSPITALIZATION Description Pt will be able to direct self care p discharge with cues/reminders and use resources independently  Outcome: Progressing

## 2017-05-04 NOTE — Progress Notes (Signed)
Orthopedic Tech Progress Note Patient Details:  [name] 02/06/1970 [id number]  Patient ID: Jenna Hill, female   DOB: Feb 06, 1970, 47 y.o.   MRN: [id number]   Hildred Priest 05/04/2017, 9:08 AM Called in hanger brace order; spoke with Caryl Pina

## 2017-05-04 NOTE — Progress Notes (Signed)
Physical Therapy Session Note  Patient Details  Name: Jenna Hill MRN: [id number] Date of Birth: October 12, 1970  Today's Date: 05/04/2017 PT Individual Time: 1300-1330 PT Individual Time Calculation (min): 30 min   Short Term Goals: Week 1:  PT Short Term Goal 1 (Week 1): Pt will ambulate 61' w/ supervision using LRAD PT Short Term Goal 2 (Week 1): Pt will self-propel w/c in household environment w/ supervision and manage w/c parts w/o cues  PT Short Term Goal 3 (Week 1): Pt will tolerate 30 min of OOB activity w/o increase in fatigue PT Short Term Goal 4 (Week 1): Pt will maintain dynamic standing balance w/ supervision   Skilled Therapeutic Interventions/Progress Updates:    Pt supine in bed, agreeable to participate in therapy session. Pt reports 8/10 pain in RLE at rest. Bed mobility SBA with increased time and use of bedrails and HOB elevated. Assisted pt with donning shoes and socks for time management. Stand pivot transfer bed to w/c with RW and min assist for balance. Manual w/c propulsion x 50 ft with SBA. Applied ACE wrap to RLE for DF assist for time management. Ascend/descend 4 stairs with 2 handrails and mod assist for balance, LE buckle with stairs due to weakness and fatigue but pt is able to maintain balance with mod assist from therapist. Nustep level 4 x 5 min with B UE/LE for endurance. Pt left seated in w/c in room with needs in reach at end of session.  Therapy Documentation Precautions:  Precautions Precautions: Fall Precaution Comments: No pf/df at R Foot. Required Braces or Orthoses: Other Brace/Splint Other Brace/Splint:  Restrictions Weight Bearing Restrictions: No  See Function Navigator for Current Functional Status.   Therapy/Group: Individual Therapy  Excell Seltzer, PT, DPT  05/04/2017, 2:21 PM

## 2017-05-04 NOTE — Progress Notes (Signed)
Social Work Patient ID: Jenna Hill, female   DOB: 02-06-1970, 47 y.o.   MRN: [id number]   CSW met with pt to update her on team conference discussion and targeted d/c date of 05-12-17.  Pt is pleased with this and feels she is making progress.  CSW will continue to follow and assist as needed.

## 2017-05-04 NOTE — Patient Care Conference (Signed)
Inpatient RehabilitationTeam Conference and Plan of Care Update Date: 05/03/2017   Time: 2:00 PM    Patient Name: Jenna Hill      Medical Record Number: [id number]  Date of Birth: Aug 08, 1970 Sex: Female         Room/Bed: 4W10C/4W10C-01 Payor Info: Payor: MEDICAID [place] / Plan: MEDICAID [place] ACCESS / Product Type: *No Product type* /    Admitting Diagnosis: debility  Admit Date/Time:  04/29/2017  4:47 PM Admission Comments: No comment available   Primary Diagnosis:  Rhabdomyolysis Principal Problem: Rhabdomyolysis  Patient Active Problem List   Diagnosis Date Noted  . 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 AKI (acute kidney injury) ([place]) 04/25/2017  . Shock liver 04/10/2017  . Rhabdomyolysis 04/10/2017  . Bipolar 1 disorder ([place]) 04/10/2017  . Acute respiratory failure ([place])   . Acute encephalopathy 04/05/2017  . Pituitary microadenoma ([place]) 04/25/2015  . OSA (obstructive sleep apnea) 04/16/2015  . Obesity 04/16/2015  . Schizoaffective disorder, depressive type ([place]) 04/15/2015  . Hypertension 10/05/2012    Expected Discharge Date: Expected Discharge Date: 05/12/17  Team Members Present: Physician leading conference: Dr. Alger Simons Social Worker Present: Lennart Pall, LCSW Nurse Present: Rozetta Nunnery, RN PT Present: Canary Brim, PT OT Present: Roanna Epley, [place], OT SLP Present: Charolett Bumpers, SLP PPS Coordinator present : Daiva Nakayama, RN, CRRN     Current Status/Progress Goal Weekly Team Focus  Medical   Patient admitted for rhabdomyolysis after drug overdose and a period of being unresponsive and down.  Patient also with right sciatic nerve injury  Reduce pain, maintain balance with mood/behavior  Pain control, blood pressure and heart rate improvements, meds being adjusted   Bowel/Bladder   continent of b/b, LBM 4/1  remain continent of b/b with Mod I assist  monitor b/b q shift and prn   Swallow/Nutrition/ Hydration             ADL's   bathing-mod A wihtout AE; LB dressing-mod A; functional transfers-min A  mod I overall  activity tolerance, BADL retraining, safety awareness, funcitonal transfers, standing balance   Mobility   supervision transfers, min guard gait (short distances 2/2 fatigue/pain)  mod I  progressing all functional mobility and endurance as well as balance   Communication             Safety/Cognition/ Behavioral Observations            Pain   pt c/o severe pain to back or right leg/foot of 9-10, minimal relief from tramadol q 12 hours and scheduled tylenol.  pain </= 4  monitor and treat pain q shift and prn, administer meds as order   Skin   abrasion right thigh, RLE with edema of 2-3+, HD cath to R IJ  no infection, no skin breakdown while on IPR  monitor skin q shift and prn,           Rehab Goals Patient on target to meet rehab goals: Yes Rehab Goals Revised: none - pt's first conference  *See Care Plan and progress notes for long and short-term goals.     Barriers to Discharge  Current Status/Progress Possible Resolutions Date Resolved   Physician    Medical stability        Continue medical management as outlined in the chart and as above, pain control      Nursing                  PT  Decreased caregiver support;Lack  of/limited family support  Friends/family only available PRN              OT Inaccessible home environment;Decreased caregiver support  lives alone in apartment             SLP                SW                Discharge Planning/Teaching Needs:  Pt plans to return to her home independently with intermittent assist from friends.  Pt will be independent in her care.   Team Discussion:  Pt with mental health history and suspected overdose where pt laid unresponsive, likely sitting on her sciatic nerve.  Pt's pain is controlled and renal function is improving - dialysis line has been removed.  Pt is continent and RN has seen some depression.  Pt with mod I goals, at min  to mod LB dressing and did well, but was lethargic with OT.  PT asked if CAM boot could be d/c'd and Dr. Naaman Plummer said it could be and AFO tried instead.  Pt is min A with PT with mod I goals.  Revisions to Treatment Plan:  none    Continued Need for Acute Rehabilitation Level of Care: The patient requires daily medical management by a physician with specialized training in physical medicine and rehabilitation for the following conditions: Daily direction of a multidisciplinary physical rehabilitation program to ensure safe treatment while eliciting the highest outcome that is of practical value to the patient.: Yes Daily medical management of patient stability for increased activity during participation in an intensive rehabilitation regime.: Yes Daily analysis of laboratory values and/or radiology reports with any subsequent need for medication adjustment of medical intervention for : Neurological problems;Blood pressure problems;Mood/behavior problems  James Senn, Silvestre Mesi 05/04/2017, 10:45 AM

## 2017-05-04 NOTE — Progress Notes (Signed)
Subjective/Complaints: Patient lying comfortably in bed.  States that she slept better last night and that her pain is better controlled.  ROS: Patient denies fever, rash, sore throat, blurred vision, nausea, vomiting, diarrhea, cough, shortness of breath or chest pain, joint or back pain, headache, or mood change.    Objective: Vital Signs: Blood pressure 129/87, pulse 99, temperature 99.1 F (37.3 C), temperature source Oral, resp. rate 18, height 5' (1.524 m), weight 120.2 kg (264 lb 15.9 oz), SpO2 96 %. No results found. Results for orders placed or performed during the hospital encounter of 04/29/17 (from the past 72 hour(s))  Renal function panel     Status: Abnormal   Collection Time: 05/02/17  5:13 AM  Result Value Ref Range   Sodium 141 135 - 145 mmol/L   Potassium 3.2 (L) 3.5 - 5.1 mmol/L   Chloride 102 101 - 111 mmol/L   CO2 27 22 - 32 mmol/L   Glucose, Bld 104 (H) 65 - 99 mg/dL   BUN 34 (H) 6 - 20 mg/dL   Creatinine, Ser 4.32 (H) 0.44 - 1.00 mg/dL   Calcium 8.9 8.9 - 10.3 mg/dL   Phosphorus 4.0 2.5 - 4.6 mg/dL   Albumin 2.4 (L) 3.5 - 5.0 g/dL   GFR calc non Af Amer 11 (L) >60 mL/min   GFR calc Af Amer 13 (L) >60 mL/min    Comment: (NOTE) The eGFR has been calculated using the CKD EPI equation. This calculation has not been validated in all clinical situations. eGFR's persistently <60 mL/min signify possible Chronic Kidney Disease.    Anion gap 12 5 - 15    Comment: Performed at [facility] [address] [place], [place] [address]  Renal function panel     Status: Abnormal   Collection Time: 05/03/17  4:24 AM  Result Value Ref Range   Sodium 140 135 - 145 mmol/L   Potassium 3.3 (L) 3.5 - 5.1 mmol/L   Chloride 101 101 - 111 mmol/L   CO2 26 22 - 32 mmol/L   Glucose, Bld 109 (H) 65 - 99 mg/dL   BUN 31 (H) 6 - 20 mg/dL   Creatinine, Ser 3.51 (H) 0.44 - 1.00 mg/dL   Calcium 8.9 8.9 - 10.3 mg/dL   Phosphorus 3.8 2.5 - 4.6 mg/dL   Albumin 2.7 (L) 3.5 -  5.0 g/dL   GFR calc non Af Amer 15 (L) >60 mL/min   GFR calc Af Amer 17 (L) >60 mL/min    Comment: (NOTE) The eGFR has been calculated using the CKD EPI equation. This calculation has not been validated in all clinical situations. eGFR's persistently <60 mL/min signify possible Chronic Kidney Disease.    Anion gap 13 5 - 15    Comment: Performed at [facility] [address], [place], [place] [address]  CBC     Status: Abnormal   Collection Time: 05/03/17  4:24 AM  Result Value Ref Range   WBC 11.3 (H) 4.0 - 10.5 K/uL   RBC 3.36 (L) 3.87 - 5.11 MIL/uL   Hemoglobin 10.0 (L) 12.0 - 15.0 g/dL   HCT 31.4 (L) 36.0 - 46.0 %   MCV 93.5 78.0 - 100.0 fL   MCH 29.8 26.0 - 34.0 pg   MCHC 31.8 30.0 - 36.0 g/dL   RDW 15.2 11.5 - 15.5 %   Platelets 307 150 - 400 K/uL    Comment: Performed at [facility], [place],  [place] [address]  Renal function panel     Status: Abnormal   Collection Time: 05/04/17  6:28 AM  Result Value Ref Range   Sodium 139 135 - 145 mmol/L   Potassium 3.4 (L) 3.5 - 5.1 mmol/L   Chloride 104 101 - 111 mmol/L   CO2 25 22 - 32 mmol/L   Glucose, Bld 96 65 - 99 mg/dL   BUN 26 (H) 6 - 20 mg/dL   Creatinine, Ser 2.90 (H) 0.44 - 1.00 mg/dL   Calcium 8.8 (L) 8.9 - 10.3 mg/dL   Phosphorus 4.1 2.5 - 4.6 mg/dL   Albumin 2.4 (L) 3.5 - 5.0 g/dL   GFR calc non Af Amer 18 (L) >60 mL/min   GFR calc Af Amer 21 (L) >60 mL/min    Comment: (NOTE) The eGFR has been calculated using the CKD EPI equation. This calculation has not been validated in all clinical situations. eGFR's persistently <60 mL/min signify possible Chronic Kidney Disease.    Anion gap 10 5 - 15    Comment: Performed at [facility] [address], [place], [place] [address]     Constitutional: No distress . Vital signs reviewed.  A little slow to arouse today HEENT: EOMI, oral membranes moist Cardiovascular: RRR without murmur. No JVD    Respiratory: CTA Bilaterally  without wheezes or rales. Normal effort    GI: BS +, non-tender, non-distended  Skin:   Intact Neuro: oriented.  Abnormal Sensory Reduced sensation dorsum of the left foot as well as little toe.  Motor testing grossly 0 out of 5 at the right ankle dorsiflexor plantar flexor.  knee extensor and hip flexor are 4/55/5 bilateral upper limbs and 5/5 in left lower limb. Weak with HAB also on right.  Musc/Skel: Slight swelling Dorsum of the right foot and ankle.  Less pain with manipulation of the right foot  psych: Flat and somewhat slow to arouse today   Assessment/Plan: 1. Functional deficits secondary to rhabdomyolysis, right foot drop which require 3+ hours per day of interdisciplinary therapy in a comprehensive inpatient rehab setting. Physiatrist is providing close team supervision and 24 hour management of active medical problems listed below. Physiatrist and rehab team continue to assess barriers to discharge/monitor patient progress toward functional and medical goals. FIM: Function - Bathing Position: Wheelchair/chair at sink Body parts bathed by patient: Left arm, Chest, Right arm, Buttocks, Right upper leg, Left upper leg, Abdomen, Front perineal area Body parts bathed by helper: Right lower leg, Back, Left lower leg Assist Level: Touching or steadying assistance(Pt > 75%)  Function- Upper Body Dressing/Undressing What is the patient wearing?: Pull over shirt/dress Pull over shirt/dress - Perfomed by patient: Thread/unthread right sleeve, Thread/unthread left sleeve, Put head through opening, Pull shirt over trunk Assist Level: Supervision or verbal cues Function - Lower Body Dressing/Undressing What is the patient wearing?: Shoes, AFO(pull over dress) Position: Sitting EOB Pants- Performed by patient: Pull pants up/down Pants- Performed by helper: Thread/unthread right pants leg, Thread/unthread left pants leg Non-skid slipper socks- Performed by patient: Don/doff right sock,  Don/doff left sock Non-skid slipper socks- Performed by helper: Don/doff right sock, Don/doff left sock Shoes - Performed by patient: Don/doff left shoe, Fasten left AFO - Performed by patient: Don/doff right AFO TED Hose - Performed by patient: Don/doff left TED hose TED Hose - Performed by helper: Don/doff right TED hose Assist for footwear: Maximal assist(CAM boot) Assist for lower body dressing: Touching or steadying assistance (Pt > 75%) Assistive Device  Comment: reacher + sock aide  Function - Toileting Toileting steps completed by patient: Adjust clothing prior to toileting, Performs perineal hygiene, Adjust clothing after toileting Toileting steps completed by helper: Adjust clothing after toileting Toileting Assistive Devices: Grab bar or rail Assist level: Touching or steadying assistance (Pt.75%)  Function - Air cabin crew transfer assistive device: Grab bar Assist level to toilet: Moderate assist (Pt 50 - 74%/lift or lower) Assist level from toilet: Moderate assist (Pt 50 - 74%/lift or lower)  Function - Chair/bed transfer Chair/bed transfer method: Stand pivot Chair/bed transfer assist level: Touching or steadying assistance (Pt > 75%) Chair/bed transfer assistive device: Armrests, Bedrails Chair/bed transfer details: Verbal cues for technique  Function - Locomotion: Wheelchair Will patient use wheelchair at discharge?: (TBD) Type: Manual Max wheelchair distance: 150' Assist Level: Supervision or verbal cues Assist Level: Supervision or verbal cues Assist Level: Supervision or verbal cues Turns around,maneuvers to table,bed, and toilet,negotiates 3% grade,maneuvers on rugs and over doorsills: No Function - Locomotion: Ambulation Assistive device: Walker-rolling Max distance: 25' Assist level: Touching or steadying assistance (Pt > 75%) Assist level: Touching or steadying assistance (Pt > 75%) Walk 50 feet with 2 turns activity did not occur:  Safety/medical concerns Walk 150 feet activity did not occur: Safety/medical concerns Walk 10 feet on uneven surfaces activity did not occur: Safety/medical concerns  Function - Comprehension Comprehension: Auditory Comprehension assist level: Follows basic conversation/direction with no assist  Function - Expression Expression: Verbal Expression assist level: Expresses basic needs/ideas: With no assist  Function - Social Interaction Social Interaction assist level: Interacts appropriately with others with medication or extra time (anti-anxiety, antidepressant).  Function - Problem Solving Problem solving assist level: Solves complex 90% of the time/cues < 10% of the time  Function - Memory Memory assist level: More than reasonable amount of time Patient normally able to recall (first 3 days only): Current season, Staff names and faces, That he or she is in a hospital  Medical Problem List and Plan: 1.Functional and mobility deficitssecondary to rhabdomyolysis and encephalopathy. Pt with right foot drop and dysesthesias----?sciatic nerve injury due to prolonged down time?  . -CIR PT, OT, SLP -AFO for right lower extremity 2. DVT Prophylaxis/Anticoagulation: Pharmaceutical:Heparin 3. Pain Management:Fentanyl patch with tramadol prn -Increased gabapentin to 300 mg nightly--need to observe for tolerance.  May be some of the reason that she is slow to arouse this morning. 4. Mood:LCSW to follow for evaluation and support. 5. Neuropsych: This patientiscapable of making decisions on herown behalf. 6. Skin/Wound Care:Pressure relief measures. 7. Fluids/Electrolytes/Nutrition:Strict I/O with daily weights.     -Continue to replace potassium  -Diet liberalized with fluid restriction 8. Rhabdomyolysis with acute renal failure: Continue to monitor UOP as well as labs for recovery. SCr trending downwards 11.3-->4.32 > 3.51> 2.9 today 4/3    -Central line removed 9. Leucocytosis: White blood cell count up to 11.3 4/2  10. Anemia: Will monitor with serial checks. H/H stable in 9-10 range.  11. Abnormal LFTs: Normalizing 12. Schizoaffective/depressive disorder: Continue Clozaril, Sinequan, Seroquel and Trintellix at bedtime.  13. HTN: Has been poorly controlled. Will monitor BP bid. Currently on Metoprolol bid.   On amlodipine 5 mg, increase to 10 mg today.  Increased metoprolol to 100 mg twice daily on 4/1   Clonidine as needed for elevated blood pressure   -consider alpha blocker or transition to labetalol Vitals:   05/03/17 2006 05/04/17 0409  BP: (!) 169/109 129/87  Pulse: (!) 112 99  Resp:  18  Temp:  99.1 F (37.3 C)  SpO2:  96%    14.  Hypokalemia, increased potassium to 20 mEq daily--continue   LOS (Days) 5 A FACE TO FACE EVALUATION WAS PERFORMED  Meredith Staggers 05/04/2017, 8:34 AM

## 2017-05-05 ENCOUNTER — Inpatient Hospital Stay (HOSPITAL_COMMUNITY): Payer: Self-pay

## 2017-05-05 ENCOUNTER — Inpatient Hospital Stay (HOSPITAL_COMMUNITY): Payer: Self-pay | Admitting: Physical Therapy

## 2017-05-05 DIAGNOSIS — D72823 Leukemoid reaction: Secondary | ICD-10-CM

## 2017-05-05 MED ORDER — GABAPENTIN 100 MG PO CAPS
200.0000 mg | ORAL_CAPSULE | Freq: Every day | ORAL | Status: DC
  Administered 2017-05-05 – 2017-05-10 (×6): 200 mg via ORAL
  Filled 2017-05-05 (×6): qty 2

## 2017-05-05 NOTE — Progress Notes (Signed)
Physical Therapy Session Note  Patient Details  Name: Quincee Gittens MRN: [id number] Date of Birth: February 17, 1970  Today's Date: 05/05/2017 PT Individual Time: 1950-9326 PT Individual Time Calculation (min): 75 min   Short Term Goals: Week 1:  PT Short Term Goal 1 (Week 1): Pt will ambulate 43' w/ supervision using LRAD PT Short Term Goal 2 (Week 1): Pt will self-propel w/c in household environment w/ supervision and manage w/c parts w/o cues  PT Short Term Goal 3 (Week 1): Pt will tolerate 30 min of OOB activity w/o increase in fatigue PT Short Term Goal 4 (Week 1): Pt will maintain dynamic standing balance w/ supervision   Skilled Therapeutic Interventions/Progress Updates:    no c/o pain.  Reported feeling more alert than this AM.  Session focus on activity tolerance, ambulation, strengthening and balance.    W/C propulsion to and from therapy gym for activity tolerance and mobility.  Gait training 2x100' with RW and close supervision.  Discussed use of foot up brace to control foot drop and improve efficiency with gait.  Pt completes 2x10 reps with 3# weights for BLE strengthening as follows: LAQ, hip flexion, pillow squeezes, minisquats, and heel raises.  PT provided verbal cues for technique throughout therex.  Pt returned to room at end of session and positioned in bed with call bell in reach and needs met.   Therapy Documentation Precautions:  Precautions Precautions: Fall Precaution Comments: No pf/df at R Foot. Required Braces or Orthoses: Other Brace/Splint Other Brace/Splint: R CAM BOOT OOB Restrictions Weight Bearing Restrictions: No   See Function Navigator for Current Functional Status.   Therapy/Group: Individual Therapy  Michel Santee 05/05/2017, 6:57 PM

## 2017-05-05 NOTE — Progress Notes (Signed)
Physical Therapy Note  Patient Details  Name: Jenna Hill MRN: [id number] Date of Birth: Aug 09, 1970 Today's Date: 05/05/2017    Attempted to see pt for scheduled therapy session.  Pt somnolent, able to arouse slightly with tactile cues, but unable to maintain level of arousal for safe therapy.  Returned approximately 30 minutes later and pt continued to be very lethargic and declining therapy.  Will f/u per POC.    Michel Santee 05/05/2017, 2:49 PM

## 2017-05-05 NOTE — Progress Notes (Signed)
Occupational Therapy Session Note  Patient Details  Name: Jenna Hill MRN: [id number] Date of Birth: 03/22/1970  Today's Date: 05/05/2017 OT Individual Time: 1000-1058 OT Individual Time Calculation (min): 58 min    Short Term Goals: Week 1:  OT Short Term Goal 1 (Week 1): Pt will complete shower transfer with min A using shower bench  OT Short Term Goal 2 (Week 1): Pt will complete LB dressing with use of AE with min A  OT Short Term Goal 3 (Week 1): Pt will tolerate standing for 5 minutes for grooming tasks  OT Short Term Goal 4 (Week 1): Pt will complete toilet transfers with walker with min guard A  Skilled Therapeutic Interventions/Progress Updates:    Pt received supine in bed with no c/o pain and agreeable to therapy. Extensive time spent discussing shower transfer with pt d/t pt fearful of attempting shower d/t fall in home that led to this hospitalization. Demonstration and education provided re use of AE/AD during showers to reduce fall risk and activity pacing/energy conservation techniques to increase safety with ADL transfers. Pt opted to wash up at sink, requiring CGA to transfer EOB to w/c with RW. Pt completed UB/LB bathing with setup, requiring frequent vc for activity pacing d/t pt fatigue and SOB esp following distal LE reaching. Pt completed standing level clothing management and peri-hygiene with CGA. Pt propelled w/c 250 x2 with increased time required for rest breaks, as well as vc required for encouragement and technique to maximize distance with each propulsion. Pt practiced tub transfer with tub bench, requiring CGA and vc for sequencing transfer. Pt returned to room and ambulated 5 ft to recliner with RW and CGA, with chair alarm set and quick release belt donned.   Therapy Documentation Precautions:  Precautions Precautions: Fall Precaution Comments: No pf/df at R Foot. Required Braces or Orthoses: Other Brace/Splint Other Brace/Splint: R CAM BOOT  OOB Restrictions Weight Bearing Restrictions: No   Pain: Pain Assessment Pain Scale: Faces Pain Score: 0-No pain Faces Pain Scale: No hurt ADL: ADL ADL Comments: see functional navigator  See Function Navigator for Current Functional Status.   Therapy/Group: Individual Therapy  Curtis Sites 05/05/2017, 11:23 AM

## 2017-05-05 NOTE — Progress Notes (Addendum)
Subjective/Complaints: Patient states that she slept well last night and that her pain is under reasonable control.  She does feel lethargic this morning.  ROS: Patient denies fever, rash, sore throat, blurred vision, nausea, vomiting, diarrhea, cough, shortness of breath or chest pain, joint or back pain, headache, or mood change.    Objective: Vital Signs: Blood pressure 135/86, pulse (!) 106, temperature 99 F (37.2 C), temperature source Oral, resp. rate 16, height 5' (1.524 m), weight 116 kg (255 lb 11.7 oz), SpO2 97 %. No results found. Results for orders placed or performed during the hospital encounter of 04/29/17 (from the past 72 hour(s))  Renal function panel     Status: Abnormal   Collection Time: 05/03/17  4:24 AM  Result Value Ref Range   Sodium 140 135 - 145 mmol/L   Potassium 3.3 (L) 3.5 - 5.1 mmol/L   Chloride 101 101 - 111 mmol/L   CO2 26 22 - 32 mmol/L   Glucose, Bld 109 (H) 65 - 99 mg/dL   BUN 31 (H) 6 - 20 mg/dL   Creatinine, Ser 3.51 (H) 0.44 - 1.00 mg/dL   Calcium 8.9 8.9 - 10.3 mg/dL   Phosphorus 3.8 2.5 - 4.6 mg/dL   Albumin 2.7 (L) 3.5 - 5.0 g/dL   GFR calc non Af Amer 15 (L) >60 mL/min   GFR calc Af Amer 17 (L) >60 mL/min    Comment: (NOTE) The eGFR has been calculated using the CKD EPI equation. This calculation has not been validated in all clinical situations. eGFR's persistently <60 mL/min signify possible Chronic Kidney Disease.    Anion gap 13 5 - 15    Comment: Performed at [facility] [address], [place], [place] [address]  CBC     Status: Abnormal   Collection Time: 05/03/17  4:24 AM  Result Value Ref Range   WBC 11.3 (H) 4.0 - 10.5 K/uL   RBC 3.36 (L) 3.87 - 5.11 MIL/uL   Hemoglobin 10.0 (L) 12.0 - 15.0 g/dL   HCT 31.4 (L) 36.0 - 46.0 %   MCV 93.5 78.0 - 100.0 fL   MCH 29.8 26.0 - 34.0 pg   MCHC 31.8 30.0 - 36.0 g/dL   RDW 15.2 11.5 - 15.5 %   Platelets 307 150 - 400 K/uL    Comment: Performed at [facility] [place] [address] [place], [place] [address]  Renal function panel     Status: Abnormal   Collection Time: 05/04/17  6:28 AM  Result Value Ref Range   Sodium 139 135 - 145 mmol/L   Potassium 3.4 (L) 3.5 - 5.1 mmol/L   Chloride 104 101 - 111 mmol/L   CO2 25 22 - 32 mmol/L   Glucose, Bld 96 65 - 99 mg/dL   BUN 26 (H) 6 - 20 mg/dL   Creatinine, Ser 2.90 (H) 0.44 - 1.00 mg/dL   Calcium 8.8 (L) 8.9 - 10.3 mg/dL   Phosphorus 4.1 2.5 - 4.6 mg/dL   Albumin 2.4 (L) 3.5 - 5.0 g/dL   GFR calc non Af Amer 18 (L) >60 mL/min   GFR calc Af Amer 21 (L) >60 mL/min    Comment: (NOTE) The eGFR has been calculated using the CKD EPI equation. This calculation has not been validated in all clinical situations. eGFR's persistently <60 mL/min signify possible Chronic Kidney Disease.    Anion gap 10 5 - 15    Comment: Performed at [facility], [address] [place], [place] [address]     Constitutional: No distress . Slow to arouse. vital signs reviewed. HEENT: EOMI, oral membranes moist Cardiovascular: RRR without murmur. No JVD    Respiratory: CTA Bilaterally without wheezes or rales. Normal effort    GI: BS +, non-tender, non-distended  Skin:   Intact Neuro: oriented.  Abnormal Sensory Reduced sensation dorsum of the left foot as well as little toe.  Motor testing grossly trace out of 5 right ankle dorsiflexor plantar flexor.  knee extensor and hip flexor are 4/5. 5/5 bilateral upper limbs and 5/5 in left lower limb.    Musc/Skel: Slight swelling Dorsum of the right foot and ankle.  Less pain with manipulation of the right foot  psych: Flat and difficult to arouse but is cooperative.   Assessment/Plan: 1. Functional deficits secondary to rhabdomyolysis, right foot drop which require 3+ hours per day of interdisciplinary therapy in a comprehensive inpatient rehab setting. Physiatrist is providing close team supervision and 24 hour management of active medical problems listed  below. Physiatrist and rehab team continue to assess barriers to discharge/monitor patient progress toward functional and medical goals. FIM: Function - Bathing Position: Wheelchair/chair at sink Body parts bathed by patient: Left arm, Chest, Right arm, Buttocks, Right upper leg, Left upper leg, Abdomen, Front perineal area, Right lower leg, Left lower leg Body parts bathed by helper: Back Assist Level: Supervision or verbal cues  Function- Upper Body Dressing/Undressing What is the patient wearing?: Pull over shirt/dress Pull over shirt/dress - Perfomed by patient: Thread/unthread right sleeve, Thread/unthread left sleeve, Put head through opening, Pull shirt over trunk Assist Level: Supervision or verbal cues Function - Lower Body Dressing/Undressing What is the patient wearing?: Pants, Non-skid slipper socks Position: Wheelchair/chair at sink Pants- Performed by patient: Pull pants up/down, Thread/unthread right pants leg, Thread/unthread left pants leg Pants- Performed by helper: Thread/unthread right pants leg, Thread/unthread left pants leg Non-skid slipper socks- Performed by patient: Don/doff right sock, Don/doff left sock Non-skid slipper socks- Performed by helper: Don/doff right sock, Don/doff left sock Shoes - Performed by patient: Don/doff left shoe, Fasten left AFO - Performed by patient: Don/doff right AFO TED Hose - Performed by patient: Don/doff left TED hose TED Hose - Performed by helper: Don/doff right TED hose Assist for footwear: Maximal assist(CAM boot) Assist for lower body dressing: Supervision or verbal cues Assistive Device Comment: reacher + sock aide  Function - Toileting Toileting activity did not occur: No continent bowel/bladder event Toileting steps completed by patient: Adjust clothing prior to toileting, Performs perineal hygiene, Adjust clothing after toileting Toileting steps completed by helper: Adjust clothing after toileting Toileting Assistive  Devices: Grab bar or rail Assist level: Touching or steadying assistance (Pt.75%)  Function - Air cabin crew transfer assistive device: Grab bar Assist level to toilet: Moderate assist (Pt 50 - 74%/lift or lower) Assist level from toilet: Moderate assist (Pt 50 - 74%/lift or lower)  Function - Chair/bed transfer Chair/bed transfer method: Ambulatory Chair/bed transfer assist level: Touching or steadying assistance (Pt > 75%) Chair/bed transfer assistive device: Armrests, Walker Chair/bed transfer details: Tactile cues for initiation, Tactile cues for sequencing, Verbal cues for technique, Verbal cues for safe use of DME/AE, Manual facilitation for weight shifting  Function - Locomotion: Wheelchair Will patient use wheelchair at discharge?: (TBD) Type: Manual Max wheelchair distance: 250 Assist Level: Supervision or verbal cues Assist Level: Supervision or verbal cues Assist Level: Supervision or verbal cues Turns around,maneuvers to table,bed, and toilet,negotiates 3% grade,maneuvers on  rugs and over doorsills: No Function - Locomotion: Ambulation Assistive device: Walker-rolling Max distance: 25' Assist level: Touching or steadying assistance (Pt > 75%) Assist level: Touching or steadying assistance (Pt > 75%) Walk 50 feet with 2 turns activity did not occur: Safety/medical concerns Walk 150 feet activity did not occur: Safety/medical concerns Walk 10 feet on uneven surfaces activity did not occur: Safety/medical concerns  Function - Comprehension Comprehension: Auditory Comprehension assist level: Follows basic conversation/direction with no assist  Function - Expression Expression: Verbal Expression assist level: Expresses basic needs/ideas: With no assist, Expresses basis less than 25% of the time/requires cueing >75% of the time.  Function - Social Interaction Social Interaction assist level: Interacts appropriately with others with medication or extra time  (anti-anxiety, antidepressant).  Function - Problem Solving Problem solving assist level: Solves complex 90% of the time/cues < 10% of the time  Function - Memory Memory assist level: More than reasonable amount of time Patient normally able to recall (first 3 days only): Current season, That he or she is in a hospital, Staff names and faces, Location of own room  Medical Problem List and Plan: 1.Functional and mobility deficitssecondary to rhabdomyolysis and encephalopathy. Pt with right foot drop and dysesthesias----?sciatic nerve injury due to prolonged down time?  . -CIR PT, OT, SLP -AFO for right lower extremity 2. DVT Prophylaxis/Anticoagulation: Pharmaceutical:Heparin 3. Pain Management:Fentanyl patch with tramadol prn -I will reduce gabapentin to 200 mg due to somnolence 4. Mood:LCSW to follow for evaluation and support. 5. Neuropsych: This patientiscapable of making decisions on herown behalf. 6. Skin/Wound Care:Pressure relief measures. 7. Fluids/Electrolytes/Nutrition:Strict I/O with daily weights.     -Continue to replace potassium  -Diet liberalized with fluid restriction 8. Rhabdomyolysis with acute renal failure: Continue to monitor UOP as well as labs for recovery. SCr trending downwards 11.3-->4.32 > 3.51> 2.9 4/3   -Recheck lab work tomorrow morning 9. Leucocytosis: White blood cell count up to 11.3 4/2    -Recheck tomorrow morning   -check urinalysis and culture as well 10. Anemia: Will monitor with serial checks. H/H stable in 9-10 range.  11. Abnormal LFTs: Normalizing 12. Schizoaffective/depressive disorder: Continue Clozaril, Sinequan, Seroquel and Trintellix at bedtime.  13. HTN: Has been poorly controlled. Will monitor BP bid. Currently on Metoprolol bid.   On amlodipine 5 mg, increased to 10 mg 4/3 improved control overall.  Increased metoprolol to 100 mg twice daily on 4/1   Clonidine as needed for elevated  blood pressure   -consider alpha blocker or transition to labetalol Vitals:   05/05/17 0450 05/05/17 1500  BP: 123/84 135/86  Pulse: 100 (!) 106  Resp: 18 16  Temp: 99.3 F (37.4 C) 99 F (37.2 C)  SpO2: 92% 97%    - 14.  Hypokalemia, increased potassium to 20 mEq daily--continue   LOS (Days) 6 A FACE TO FACE EVALUATION WAS PERFORMED  Meredith Staggers 05/05/2017, 4:36 PM

## 2017-05-06 ENCOUNTER — Inpatient Hospital Stay (HOSPITAL_COMMUNITY): Payer: Self-pay

## 2017-05-06 ENCOUNTER — Inpatient Hospital Stay (HOSPITAL_COMMUNITY): Payer: Self-pay | Admitting: Physical Therapy

## 2017-05-06 LAB — URINALYSIS, COMPLETE (UACMP) WITH MICROSCOPIC
Bilirubin Urine: NEGATIVE
GLUCOSE, UA: NEGATIVE mg/dL
KETONES UR: NEGATIVE mg/dL
Nitrite: NEGATIVE
PH: 7 (ref 5.0–8.0)
Protein, ur: 30 mg/dL — AB
SPECIFIC GRAVITY, URINE: 1.017 (ref 1.005–1.030)

## 2017-05-06 LAB — BASIC METABOLIC PANEL
ANION GAP: 14 (ref 5–15)
BUN: 25 mg/dL — ABNORMAL HIGH (ref 6–20)
CALCIUM: 8.7 mg/dL — AB (ref 8.9–10.3)
CHLORIDE: 102 mmol/L (ref 101–111)
CO2: 24 mmol/L (ref 22–32)
Creatinine, Ser: 2.68 mg/dL — ABNORMAL HIGH (ref 0.44–1.00)
GFR calc Af Amer: 23 mL/min — ABNORMAL LOW (ref >60)
GFR calc non Af Amer: 20 mL/min — ABNORMAL LOW (ref >60)
GLUCOSE: 93 mg/dL (ref 65–99)
POTASSIUM: 3.4 mmol/L — AB (ref 3.5–5.1)
Sodium: 140 mmol/L (ref 135–145)

## 2017-05-06 LAB — CBC
HEMATOCRIT: 32.1 % — AB (ref 36.0–46.0)
HEMOGLOBIN: 10.1 g/dL — AB (ref 12.0–15.0)
MCH: 29.8 pg (ref 26.0–34.0)
MCHC: 31.5 g/dL (ref 30.0–36.0)
MCV: 94.7 fL (ref 78.0–100.0)
Platelets: 352 10*3/uL (ref 150–400)
RBC: 3.39 MIL/uL — ABNORMAL LOW (ref 3.87–5.11)
RDW: 15.6 % — ABNORMAL HIGH (ref 11.5–15.5)
WBC: 8.3 10*3/uL (ref 4.0–10.5)

## 2017-05-06 NOTE — Progress Notes (Signed)
Physical Therapy Session Note  Patient Details  Name: Jenna Hill MRN: [id number] Date of Birth: April 08, 1970  Today's Date: 05/06/2017 PT Individual Time: 6712-4580 PT Individual Time Calculation (min): 40 min   Short Term Goals: Week 1:  PT Short Term Goal 1 (Week 1): Pt will ambulate 64' w/ supervision using LRAD PT Short Term Goal 2 (Week 1): Pt will self-propel w/c in household environment w/ supervision and manage w/c parts w/o cues  PT Short Term Goal 3 (Week 1): Pt will tolerate 30 min of OOB activity w/o increase in fatigue PT Short Term Goal 4 (Week 1): Pt will maintain dynamic standing balance w/ supervision   Skilled Therapeutic Interventions/Progress Updates:    no c/o pain at rest but does endorse LE pain with movement.  RN notified.  Session focus on activity tolerance via w/c propulsion and stair negotiation, and on balance/safety during transfers.    W/c mobility throughout unit, max distance 150', for UE strengthening and activity tolerance.  Pt transitions sit<>stand throughout session with supervision.  Pt completes 2x4 steps with bilateral hand rails and min assist with mod multimodal cues for sequencing.  Pt required seated rest breaks between each trial due to fatigue.  Pt requesting to use bathroom.  Requires min assist and max cues for safety during stand/pivot from w/c<>commode.  Pt returned to room at end of session and positioned upright in w/c with QRB in place, call bell in reach and needs met.   Therapy Documentation Precautions:  Precautions Precautions: Fall Precaution Comments: No pf/df at R Foot. Required Braces or Orthoses: Other Brace/Splint Other Brace/Splint: R toe up brace Restrictions Weight Bearing Restrictions: No   See Function Navigator for Current Functional Status.   Therapy/Group: Individual Therapy  Michel Santee 05/06/2017, 2:47 PM

## 2017-05-06 NOTE — Plan of Care (Signed)
  Problem: Consults Goal: RH GENERAL PATIENT EDUCATION Description See Patient Education module for education specifics. Outcome: Progressing   Problem: RH BOWEL ELIMINATION Goal: RH STG MANAGE BOWEL WITH ASSISTANCE Description STG Manage Bowel with mod I  Assistance.  Outcome: Progressing   Problem: RH SKIN INTEGRITY Goal: RH STG SKIN FREE OF INFECTION/BREAKDOWN Description With min assist  Outcome: Progressing Goal: RH STG MAINTAIN SKIN INTEGRITY WITH ASSISTANCE Description STG Maintain Skin Integrity With min Assistance.  Outcome: Progressing   Problem: RH SAFETY Goal: RH STG DECREASED RISK OF FALL WITH ASSISTANCE Description STG Decreased Risk of Fall With  [employer].  Outcome: Progressing   Problem: RH PAIN MANAGEMENT Goal: RH STG PAIN MANAGED AT OR BELOW PT'S PAIN GOAL Description At or below level 6  Outcome: Progressing   Problem: RH KNOWLEDGE DEFICIT GENERAL Goal: RH STG INCREASE KNOWLEDGE OF SELF CARE AFTER HOSPITALIZATION Description Pt will be able to direct self care p discharge with cues/reminders and use resources independently  Outcome: Progressing

## 2017-05-06 NOTE — Progress Notes (Signed)
Occupational Therapy Session Note  Patient Details  Name: Jenna Hill MRN: [id number] Date of Birth: 1970-06-17  Today's Date: 05/06/2017 OT Individual Time: 1000-1100 OT Individual Time Calculation (min): 60 min    Short Term Goals: Week 1:  OT Short Term Goal 1 (Week 1): Pt will complete shower transfer with min A using shower bench  OT Short Term Goal 2 (Week 1): Pt will complete LB dressing with use of AE with min A  OT Short Term Goal 3 (Week 1): Pt will tolerate standing for 5 minutes for grooming tasks  OT Short Term Goal 4 (Week 1): Pt will complete toilet transfers with walker with min guard A  Skilled Therapeutic Interventions/Progress Updates:    Pt engaged in BADL retraining including bathing at shower level, toileting, and dressing with sit<>stand from w/c at sink.  Pt amb with RW (supervision) to bathroom to use toilet prior to transferring to shower seat.  Pt completed bathing tasks at supervision level using AE appropriately.  Pt required assistance donning R sock but completed all other tasks without assistance.  Min verbal cues to lock brakes X 1 before standing. Pt required more than a reasonable amount of time to complete all tasks.  Pt remained in w/c with all needs within reach and QRB in place.   Therapy Documentation Precautions:  Precautions Precautions: Fall Precaution Comments: No pf/df at R Foot. Required Braces or Orthoses: Other Brace/Splint Other Brace/Splint: R CAM BOOT OOB Restrictions Weight Bearing Restrictions: No Pain: Pain Assessment Pain Scale: 0-10 Pain Score: 0-No pain Faces Pain Scale: No hurt  See Function Navigator for Current Functional Status.   Therapy/Group: Individual Therapy  Leroy Libman 05/06/2017, 12:10 PM

## 2017-05-06 NOTE — Progress Notes (Signed)
Physical Therapy Session Note  Patient Details  Name: Jenna Hill MRN: [id number] Date of Birth: 21-May-1970  Today's Date: 05/06/2017 PT Individual Time: 9211-9417 PT Individual Time Calculation (min): 40 min   Short Term Goals: Week 1:  PT Short Term Goal 1 (Week 1): Pt will ambulate 73' w/ supervision using LRAD PT Short Term Goal 2 (Week 1): Pt will self-propel w/c in household environment w/ supervision and manage w/c parts w/o cues  PT Short Term Goal 3 (Week 1): Pt will tolerate 30 min of OOB activity w/o increase in fatigue PT Short Term Goal 4 (Week 1): Pt will maintain dynamic standing balance w/ supervision   Skilled Therapeutic Interventions/Progress Updates:    Focused on dynamic gait through obstacle course to simulate home environment mobility navigating through turns and stepping over threshold with RW overall steadying to min assist needed. Pt initially demonstrates difficulty following sequence that PT presented and pt just walking past cones and around the threshold. Required demonstration and then able to perform with min cues still to not run over cones. Discussed home environment set up and furniture transfers with goal to practice real bed in ADL apartment. Once in apartment, pt asking what we are going to do and PT reminded her that we were there to practice getting into the real bed. Pt able to perform sit > stand with RW with supervision and close supervision gait with RW but pt transferred to recliner instead of bed and demonstrated no awareness when asked that this was not the task we were practicing. Pt required min assist for sit -> stand from low and rocking recliner for balance. Completed supine <> sit with supervision on flat bed for safety near edge. Rolled in bed mod I without rails. Pt propelled w/c throughout session on unit with supervision for cues for efficient propulsion technique for functional strengthening and cardiovascular endurance.   Therapy  Documentation Precautions:  Precautions Precautions: Fall Precaution Comments: No pf/df at R Foot. Required Braces or Orthoses: Other Brace/Splint Other Brace/Splint: R toe up brace Restrictions Weight Bearing Restrictions: No  Pain:  Reports premedicated for ankle pain.    See Function Navigator for Current Functional Status.   Therapy/Group: Individual Therapy  Canary Brim Ivory Broad, PT, DPT  05/06/2017, 2:31 PM

## 2017-05-06 NOTE — Progress Notes (Signed)
Subjective/Complaints: Patient appears to be in good spirits.  States that she had a reasonable night and that her pain is under control at present.  ROS: Patient denies fever, rash, sore throat, blurred vision, nausea, vomiting, diarrhea, cough, shortness of breath or chest pain, joint or back pain, headache, or mood change.    Objective: Vital Signs: Blood pressure 130/82, pulse 97, temperature 98.1 F (36.7 C), temperature source Oral, resp. rate 15, height 5' (1.524 m), weight 111.9 kg (246 lb 11.1 oz), SpO2 99 %. No results found. Results for orders placed or performed during the hospital encounter of 04/29/17 (from the past 72 hour(s))  Renal function panel     Status: Abnormal   Collection Time: 05/04/17  6:28 AM  Result Value Ref Range   Sodium 139 135 - 145 mmol/L   Potassium 3.4 (L) 3.5 - 5.1 mmol/L   Chloride 104 101 - 111 mmol/L   CO2 25 22 - 32 mmol/L   Glucose, Bld 96 65 - 99 mg/dL   BUN 26 (H) 6 - 20 mg/dL   Creatinine, Ser 2.90 (H) 0.44 - 1.00 mg/dL   Calcium 8.8 (L) 8.9 - 10.3 mg/dL   Phosphorus 4.1 2.5 - 4.6 mg/dL   Albumin 2.4 (L) 3.5 - 5.0 g/dL   GFR calc non Af Amer 18 (L) >60 mL/min   GFR calc Af Amer 21 (L) >60 mL/min    Comment: (NOTE) The eGFR has been calculated using the CKD EPI equation. This calculation has not been validated in all clinical situations. eGFR's persistently <60 mL/min signify possible Chronic Kidney Disease.    Anion gap 10 5 - 15    Comment: Performed at [facility] [address], [place], [place] [address]  Basic metabolic panel     Status: Abnormal   Collection Time: 05/06/17  6:25 AM  Result Value Ref Range   Sodium 140 135 - 145 mmol/L   Potassium 3.4 (L) 3.5 - 5.1 mmol/L   Chloride 102 101 - 111 mmol/L   CO2 24 22 - 32 mmol/L   Glucose, Bld 93 65 - 99 mg/dL   BUN 25 (H) 6 - 20 mg/dL   Creatinine, Ser 2.68 (H) 0.44 - 1.00 mg/dL   Calcium 8.7 (L) 8.9 - 10.3 mg/dL   GFR calc non Af Amer 20 (L) >60 mL/min   GFR  calc Af Amer 23 (L) >60 mL/min    Comment: (NOTE) The eGFR has been calculated using the CKD EPI equation. This calculation has not been validated in all clinical situations. eGFR's persistently <60 mL/min signify possible Chronic Kidney Disease.    Anion gap 14 5 - 15    Comment: Performed at [facility] [address], [place], [place] [address]  CBC     Status: Abnormal   Collection Time: 05/06/17  6:25 AM  Result Value Ref Range   WBC 8.3 4.0 - 10.5 K/uL   RBC 3.39 (L) 3.87 - 5.11 MIL/uL   Hemoglobin 10.1 (L) 12.0 - 15.0 g/dL   HCT 32.1 (L) 36.0 - 46.0 %   MCV 94.7 78.0 - 100.0 fL   MCH 29.8 26.0 - 34.0 pg   MCHC 31.5 30.0 - 36.0 g/dL   RDW 15.6 (H) 11.5 - 15.5 %   Platelets 352 150 - 400 K/uL    Comment: Performed at [facility], [place] [address], [place], [place] [address]     Constitutional: No distress . Vital signs reviewed.  More alert HEENT: EOMI, oral membranes moist Cardiovascular: RRR without murmur. No JVD    Respiratory: CTA Bilaterally without wheezes or rales. Normal effort    GI: BS +, non-tender, non-distended  Skin:   Intact Neuro: oriented.  Abnormal Sensory Reduced sensation dorsum of the left foot as well as little toe.  Motor testing remains  trace out of 5 right ankle dorsiflexor plantar flexor.  knee extensor and hip flexor are 4/5. 5/5 bilateral upper limbs and 5/5 in left lower limb.    Musc/Skel: Slight swelling Dorsum of the right foot and ankle.  Only slight pain with manipulation of the leg.   Psych: Pleasant and more engaging today.   Assessment/Plan: 1. Functional deficits secondary to rhabdomyolysis, right foot drop which require 3+ hours per day of interdisciplinary therapy in a comprehensive inpatient rehab setting. Physiatrist is providing close team supervision and 24 hour management of active medical problems listed below. Physiatrist and rehab team continue to assess barriers to discharge/monitor patient progress toward  functional and medical goals. FIM: Function - Bathing Position: Wheelchair/chair at sink Body parts bathed by patient: Left arm, Chest, Right arm, Buttocks, Right upper leg, Left upper leg, Abdomen, Front perineal area, Right lower leg, Left lower leg Body parts bathed by helper: Back Assist Level: Supervision or verbal cues  Function- Upper Body Dressing/Undressing What is the patient wearing?: Pull over shirt/dress Pull over shirt/dress - Perfomed by patient: Thread/unthread right sleeve, Thread/unthread left sleeve, Put head through opening, Pull shirt over trunk Assist Level: Supervision or verbal cues Function - Lower Body Dressing/Undressing What is the patient wearing?: Pants, Non-skid slipper socks Position: Wheelchair/chair at sink Pants- Performed by patient: Pull pants up/down, Thread/unthread right pants leg, Thread/unthread left pants leg Pants- Performed by helper: Thread/unthread right pants leg, Thread/unthread left pants leg Non-skid slipper socks- Performed by patient: Don/doff right sock, Don/doff left sock Non-skid slipper socks- Performed by helper: Don/doff right sock, Don/doff left sock Shoes - Performed by patient: Don/doff left shoe, Fasten left AFO - Performed by patient: Don/doff right AFO TED Hose - Performed by patient: Don/doff left TED hose TED Hose - Performed by helper: Don/doff right TED hose Assist for footwear: Maximal assist(CAM boot) Assist for lower body dressing: Supervision or verbal cues Assistive Device Comment: reacher + sock aide  Function - Toileting Toileting activity did not occur: No continent bowel/bladder event Toileting steps completed by patient: Adjust clothing prior to toileting, Performs perineal hygiene, Adjust clothing after toileting Toileting steps completed by helper: Adjust clothing after toileting Toileting Assistive Devices: Grab bar or rail Assist level: Touching or steadying assistance (Pt.75%)  Function - Engineer, petroleum transfer assistive device: Grab bar Assist level to toilet: Moderate assist (Pt 50 - 74%/lift or lower) Assist level from toilet: Moderate assist (Pt 50 - 74%/lift or lower)  Function - Chair/bed transfer Chair/bed transfer method: Ambulatory Chair/bed transfer assist level: Touching or steadying assistance (Pt > 75%) Chair/bed transfer assistive device: Armrests, Walker Chair/bed transfer details: Tactile cues for initiation, Tactile cues for sequencing, Verbal cues for technique, Verbal cues for safe use of DME/AE, Manual facilitation for weight shifting  Function - Locomotion: Wheelchair Will patient use wheelchair at discharge?: (TBD) Type: Manual Max wheelchair distance: 250 Assist Level: Supervision or verbal cues Assist Level: Supervision or verbal cues Assist Level: Supervision or verbal cues Turns around,maneuvers to table,bed, and toilet,negotiates 3% grade,maneuvers on rugs and over doorsills: No Function - Locomotion: Ambulation Assistive device: Walker-rolling Max distance: 25' Assist level: Touching or  steadying assistance (Pt > 75%) Assist level: Touching or steadying assistance (Pt > 75%) Walk 50 feet with 2 turns activity did not occur: Safety/medical concerns Walk 150 feet activity did not occur: Safety/medical concerns Walk 10 feet on uneven surfaces activity did not occur: Safety/medical concerns  Function - Comprehension Comprehension: Auditory Comprehension assist level: Follows basic conversation/direction with no assist  Function - Expression Expression: Verbal Expression assist level: Expresses basic needs/ideas: With no assist  Function - Social Interaction Social Interaction assist level: Interacts appropriately with others with medication or extra time (anti-anxiety, antidepressant).  Function - Problem Solving Problem solving assist level: Solves basic 90% of the time/requires cueing < 10% of the time  Function - Memory Memory  assist level: More than reasonable amount of time Patient normally able to recall (first 3 days only): Current season, Location of own room, Staff names and faces, That he or she is in a hospital  Medical Problem List and Plan: 1.Functional and mobility deficitssecondary to rhabdomyolysis and encephalopathy. Pt with right foot drop and dysesthesias----?sciatic nerve injury due to prolonged down time?  . -CIR PT, OT, SLP -AFO to be delivered today 2. DVT Prophylaxis/Anticoagulation: Pharmaceutical:Heparin 3. Pain Management:Fentanyl patch with tramadol prn -I have reduced gabapentin to 200 mg due to somnolence--- reduction seems to have benefited her 4. Mood:LCSW to follow for evaluation and support. 5. Neuropsych: This patientiscapable of making decisions on herown behalf. 6. Skin/Wound Care:Pressure relief measures. 7. Fluids/Electrolytes/Nutrition:Strict I/O with daily weights.     -Continue to replace potassium  -Diet liberalized with fluid restriction 8. Rhabdomyolysis with acute renal failure: Continue to monitor UOP as well as labs for recovery. SCr trending downwards 11.3-->4.32 > 3.51> 2.9 > 2.68 4/5   -Follow-up lab work next week  9. Leucocytosis: White blood cell count up to down to 8.3 4/5   -Urinalysis is positive, culture is pending   -We will await culture results before treating.  10. Anemia: Will monitor with serial checks. H/H stable in 9-10 range.  11. Abnormal LFTs: Normalizing 12. Schizoaffective/depressive disorder: Continue Clozaril, Sinequan, Seroquel and Trintellix at bedtime.  13. HTN: Has been poorly controlled. Will monitor BP bid. Currently on Metoprolol bid.   On amlodipine 5 mg, increased to 10 mg 4/3 with improved control overall.  Increased metoprolol to 100 mg twice daily on 4/1   Clonidine as needed for elevated blood pressure     Vitals:   05/05/17 2134 05/06/17 0514  BP: (!) 142/87 130/82  Pulse:  95 97  Resp:  15  Temp:  98.1 F (36.7 C)  SpO2:  99%    - 14.  Hypokalemia, increased potassium to 20 mEq daily--continue   LOS (Days) 7 A FACE TO FACE EVALUATION WAS PERFORMED  Meredith Staggers 05/06/2017, 10:36 AM

## 2017-05-06 NOTE — Progress Notes (Signed)
Occupational Therapy Note  Patient Details  Name: Jennaya Pogue MRN: [id number] Date of Birth: March 17, 1970  Today's Date: 05/06/2017 OT Individual Time: 1405-1440 OT Individual Time Calculation (min): 35 min   Pt denies pain Individual Therapy  OT intervention with focus on dynamic standing balance and problem solving to increase independence with BADLs and IADLs. Pt propelled w/c to day room and engaged in table tasks (peg board and pipe treet) while standing.  Pt c/o dizzyness while standing; BP at 122/84.  Pt stated dizzyness resolved after sitting ~5 mins and stood back up to complete increasingly complicated patterns on peg board.  Pt able to problem solve with min A.  Pt returned to room and remained in w/c with all needs within reach and QRB in place.    [name] [facility] 05/06/2017, 2:54 PM

## 2017-05-07 ENCOUNTER — Inpatient Hospital Stay (HOSPITAL_COMMUNITY): Payer: Self-pay | Admitting: Physical Therapy

## 2017-05-07 DIAGNOSIS — T796XXD Traumatic ischemia of muscle, subsequent encounter: Secondary | ICD-10-CM

## 2017-05-07 LAB — URINE CULTURE

## 2017-05-07 NOTE — Progress Notes (Signed)
Patient ID: Jenna Hill, female   DOB: [birth year], 47 y.o.   MRN: [id number]   Jenna Hill is a 47 y.o. female Who is admitted for rehab with functional and mobility deficits secondary to rhabdomyolysis and encephalopathy  Subjective: No new complaints. No new problems.  Continues to have right lower extremity dysesthesias  Past Medical History:  Diagnosis Date  . Bipolar 1 disorder ([place])   . Difficult intubation   . Hypertension   . PONV (postoperative nausea and vomiting)   . Schizophrenia ([place])   . Spinal headache      Objective: Vital signs in last 24 hours: Temp:  [98.7 F (37.1 C)-98.8 F (37.1 C)] 98.8 F (37.1 C) (04/06 0545) Pulse Rate:  [96-120] 98 (04/06 0545) Resp:  [18-20] 18 (04/06 0545) BP: (118-173)/(73-103) 121/78 (04/06 0545) SpO2:  [94 %-100 %] 94 % (04/06 0545) Weight:  [244 lb (110.7 kg)] 244 lb (110.7 kg) (04/06 0545) Weight change: -2 lb 11.1 oz (-1.222 kg) Last BM Date: 05/06/17  Intake/Output from previous day: 04/05 0701 - 04/06 0700 In: 840 [P.O.:840] Out: -   Patient Vitals for the past 24 hrs:  BP Temp Temp src Pulse Resp SpO2 Weight  05/07/17 0545 121/78 98.8 F (37.1 C) Oral 98 18 94 % 244 lb (110.7 kg)  05/06/17 2044 (!) 173/103 - - (!) 120 - - -  05/06/17 1406 118/73 98.7 F (37.1 C) Oral 96 20 100 % -     Physical Exam General: No apparent distress overweight HEENT: not dry Lungs: Normal effort. Lungs clear to auscultation, no crackles or wheezes. Cardiovascular: Regular rate and rhythm, no edema.  Pulse 90-100 Abdomen: S/NT/ND; BS(+) Musculoskeletal:  unchanged Neurological: No new neurological deficits Extremities.  +1 edema right foot Skin: clear warm and dry Mental state: Alert, oriented, cooperative    Lab Results: BMET    Component Value Date/Time   NA 140 05/06/2017 0625   K 3.4 (L) 05/06/2017 0625   CL 102 05/06/2017 0625   CO2 24 05/06/2017 0625   GLUCOSE 93 05/06/2017 0625   BUN 25 (H) 05/06/2017  0625   CREATININE 2.68 (H) 05/06/2017 0625   CREATININE 0.79 03/05/2016 1435   CALCIUM 8.7 (L) 05/06/2017 0625   GFRNONAA 20 (L) 05/06/2017 0625   GFRNONAA >89 03/05/2016 1435   GFRAA 23 (L) 05/06/2017 0625   GFRAA >89 03/05/2016 1435   CBC    Component Value Date/Time   WBC 8.3 05/06/2017 0625   RBC 3.39 (L) 05/06/2017 0625   HGB 10.1 (L) 05/06/2017 0625   HCT 32.1 (L) 05/06/2017 0625   PLT 352 05/06/2017 0625   MCV 94.7 05/06/2017 0625   MCH 29.8 05/06/2017 0625   MCHC 31.5 05/06/2017 0625   RDW 15.6 (H) 05/06/2017 0625   LYMPHSABS 2.9 04/30/2017 0321   MONOABS 0.6 04/30/2017 0321   EOSABS 0.4 04/30/2017 0321   BASOSABS 0.0 04/30/2017 0321     Medications: I have reviewed the patient's current medications.  Assessment/Plan:  Functional deficits in mobility deficits secondary to rhabdomyolysis and encephalopathy.  Continue CIR with OT PT Pain management continue fentanyl tramadol and gabapentin Acute renal failure secondary to rhabdomyolysis.  Serum creatinine continues to improve.  Will check renal indices again on Monday Essential hypertension.  Blood pressure better controlled today.  Continue to monitor on present regimen.  Pulse still a bit elevated in spite of metoprolol      Length of stay, days: [place] , MD 05/07/2017, 9:53 AM

## 2017-05-07 NOTE — Progress Notes (Signed)
Physical Therapy Weekly Progress Note  Patient Details  Name: Jenna Hill MRN: [id number] Date of Birth: Jun 13, 1970  Beginning of progress report period: April 30, 2017 End of progress report period: May 07, 2017  Today's Date: 05/07/2017 PT Individual Time: 1345-1419 PT Individual Time Calculation (min): 34 min   Patient has met 4 of 4 short term goals.  Pt has made great progress with therapy this period, and is currently performing all mobility with close supervision or better.  She continues to be somewhat limited in her mobility 2/2 RLE pain.  Patient continues to demonstrate the following deficits muscle weakness and muscle joint tightness, decreased cardiorespiratoy endurance and decreased standing balance, decreased postural control and decreased balance strategies and therefore will continue to benefit from skilled PT intervention to increase functional independence with mobility.  Patient progressing toward long term goals..  Continue plan of care.  PT Short Term Goals Week 1:  PT Short Term Goal 1 (Week 1): Pt will ambulate 65' w/ supervision using LRAD PT Short Term Goal 1 - Progress (Week 1): Met PT Short Term Goal 2 (Week 1): Pt will self-propel w/c in household environment w/ supervision and manage w/c parts w/o cues  PT Short Term Goal 2 - Progress (Week 1): Met PT Short Term Goal 3 (Week 1): Pt will tolerate 30 min of OOB activity w/o increase in fatigue PT Short Term Goal 3 - Progress (Week 1): Met PT Short Term Goal 4 (Week 1): Pt will maintain dynamic standing balance w/ supervision  PT Short Term Goal 4 - Progress (Week 1): Met Week 2:  PT Short Term Goal 1 (Week 2): =LTGs due to ELOS  Skilled Therapeutic Interventions/Progress Updates:    c/o 8/10 pain at rest.  Agreeable to bedside therapy only.  Pt transitions to EOB mod I (HOB raised), and dons pants/socks with set up assist.  Sit<>stand with supervision to pull pants over hips, and pt requires supervision with  verbal cues for hand placement for stand/pivot to recliner on L.  In recliner pt completes 10-20 reps bilaterally: LAQ, hip abduction with level 1 theraband, isometric hip adduction with pillow squeeze, heel slides, and SLR.  Rest breaks provided as needed.  Pt declining any further therapy at this time 2/2 pain.  RN aware and medicated during session.  Pt left in recliner with chair alarm activated, call bell in reach and needs met.   Therapy Documentation Precautions:  Precautions Precautions: Fall Precaution Comments: No pf/df at R Foot. Required Braces or Orthoses: Other Brace/Splint Other Brace/Splint: R toe up brace Restrictions Weight Bearing Restrictions: No General: PT Amount of Missed Time (min): 26 Minutes PT Missed Treatment Reason: Pain(8/10 pain at rest)   See Function Navigator for Current Functional Status.  Therapy/Group: Individual Therapy  Michel Santee 05/07/2017, 2:20 PM

## 2017-05-07 NOTE — Plan of Care (Signed)
  Problem: RH PAIN MANAGEMENT Goal: RH STG PAIN MANAGED AT OR BELOW PT'S PAIN GOAL Description At or below level 6  Outcome: Not Progressing

## 2017-05-08 MED ORDER — DIAZEPAM 5 MG PO TABS
ORAL_TABLET | ORAL | Status: AC
  Administered 2017-05-08: 5 mg
  Filled 2017-05-08: qty 1

## 2017-05-08 NOTE — Progress Notes (Signed)
Patient ID: Jenna Hill, female   DOB: 1970/05/22, 47 y.o.   MRN: [id number]   Jenna Hill is a 47 y.o. female Who is admitted for CIR with functional and mobility deficits related to rhabdomyolysis associated with encephalopathy.   Subjective: No new complaints. No new problems.  Continues to have right sided dysesthesias most marked in the right lower extremity  Objective: Vital signs in last 24 hours: Temp:  [98.6 F (37 C)-98.8 F (37.1 C)] 98.8 F (37.1 C) (04/07 0100) Pulse Rate:  [101-112] 109 (04/07 0100) Resp:  [18] 18 (04/07 0100) BP: (138-155)/(91-95) 142/95 (04/07 0100) SpO2:  [96 %-99 %] 96 % (04/07 0100) Weight:  [241 lb 6.5 oz (109.5 kg)] 241 lb 6.5 oz (109.5 kg) (04/07 0100) Weight change: -2 lb 9.5 oz (-1.178 kg) Last BM Date: 05/06/17  Intake/Output from previous day: 04/06 0701 - 04/07 0700 In: 700 [P.O.:700] Out: -    Patient Vitals for the past 24 hrs:  BP Temp Temp src Pulse Resp SpO2 Weight  05/08/17 0100 (!) 142/95 98.8 F (37.1 C) Oral (!) 109 18 96 % 241 lb 6.5 oz (109.5 kg)  05/07/17 2100 (!) 155/93 - - (!) 112 - - -  05/07/17 1355 (!) 138/91 98.6 F (37 C) Oral (!) 101 - 99 % -     Physical Exam General: No apparent distress   overweight HEENT: Unremarkable Lungs: Normal effort. Lungs clear to auscultation, no crackles or wheezes. Cardiovascular: Regular rate and rhythm, no edema Abdomen: S/NT/ND; BS(+) Musculoskeletal:  unchanged Neurological: No new neurological deficits Trace edema right dorsal foot Skin: clear   Mental state: Alert, oriented, cooperative    Lab Results: BMET    Component Value Date/Time   NA 140 05/06/2017 0625   K 3.4 (L) 05/06/2017 0625   CL 102 05/06/2017 0625   CO2 24 05/06/2017 0625   GLUCOSE 93 05/06/2017 0625   BUN 25 (H) 05/06/2017 0625   CREATININE 2.68 (H) 05/06/2017 0625   CREATININE 0.79 03/05/2016 1435   CALCIUM 8.7 (L) 05/06/2017 0625   GFRNONAA 20 (L) 05/06/2017 0625   GFRNONAA >89  03/05/2016 1435   GFRAA 23 (L) 05/06/2017 0625   GFRAA >89 03/05/2016 1435   CBC    Component Value Date/Time   WBC 8.3 05/06/2017 0625   RBC 3.39 (L) 05/06/2017 0625   HGB 10.1 (L) 05/06/2017 0625   HCT 32.1 (L) 05/06/2017 0625   PLT 352 05/06/2017 0625   MCV 94.7 05/06/2017 0625   MCH 29.8 05/06/2017 0625   MCHC 31.5 05/06/2017 0625   RDW 15.6 (H) 05/06/2017 0625   LYMPHSABS 2.9 04/30/2017 0321   MONOABS 0.6 04/30/2017 0321   EOSABS 0.4 04/30/2017 0321   BASOSABS 0.0 04/30/2017 0321    Medications: I have reviewed the patient's current medications.  Assessment/Plan:  Functional deficits secondary to rhabdomyolysis.  Continue CIR Pain management.  Continue present regimen of fentanyl tramadol and gabapentin History of acute renal failure secondary to rhabdomyolysis.  Follow-up renal indices tomorrow Essential hypertension.  Blood pressure bit elevated past 24 hours we will continue to monitor on present regimen    Length of stay, days: [place] , MD 05/08/2017, 11:24 AM

## 2017-05-09 ENCOUNTER — Inpatient Hospital Stay (HOSPITAL_COMMUNITY): Payer: Self-pay

## 2017-05-09 ENCOUNTER — Inpatient Hospital Stay (HOSPITAL_COMMUNITY): Payer: Self-pay | Admitting: Physical Therapy

## 2017-05-09 LAB — BASIC METABOLIC PANEL
Anion gap: 13 (ref 5–15)
BUN: 16 mg/dL (ref 6–20)
CO2: 22 mmol/L (ref 22–32)
CREATININE: 1.91 mg/dL — AB (ref 0.44–1.00)
Calcium: 8.9 mg/dL (ref 8.9–10.3)
Chloride: 104 mmol/L (ref 101–111)
GFR, EST AFRICAN AMERICAN: 35 mL/min — AB (ref >60)
GFR, EST NON AFRICAN AMERICAN: 30 mL/min — AB (ref >60)
Glucose, Bld: 104 mg/dL — ABNORMAL HIGH (ref 65–99)
POTASSIUM: 3.8 mmol/L (ref 3.5–5.1)
SODIUM: 139 mmol/L (ref 135–145)

## 2017-05-09 NOTE — Progress Notes (Signed)
Occupational Therapy Note  Patient Details  Name: Jenna Hill MRN: [id number] Date of Birth: [birth year]  Today's Date: 05/09/2017 OT Individual Time: 1400-1430 OT Individual Time Calculation (min): 30 min   Pt c/o 5/10 RLE pain; RN aware Individual Therapy  OT intervention with focus on tub transfer bench transfers and simple home mgmt tasks in ADL apartment.  Pt performed all functional transfers and simple home mgmt tasks at supervision level. Pt required min verbal cues for safety awareness with home mgmt tasks.  Pt propelled w/c back to room and remained in w/c with all needs within reach.    [name] [facility] 05/09/2017, 2:55 PM

## 2017-05-09 NOTE — Progress Notes (Signed)
Occupational Therapy Weekly Progress Note  Patient Details  Name: Jenna Hill MRN: [id number] Date of Birth: 08/31/1970  Beginning of progress report period: April 30, 2017 End of progress report period: May 09, 2017   Patient has met 4 of 4 short term goals. Pt has made steady progress with BADLs since admission and performs bathing, dressing, toileting, and functional transfers at supervision level.  Pt fatigues quickly and requires multiple rest breaks during sessions.  Pt has been educated on energy conservation strategies.   Patient continues to demonstrate the following deficits: overall muscle weakness, decreased coordination and decreased postural control and decreased balance strategies, and reduced problem solving and therefore will continue to benefit from skilled OT intervention to enhance overall performance with BADL.  Patient progressing toward long term goals..  Continue plan of care.  OT Short Term Goals Week 1:  OT Short Term Goal 1 (Week 1): Pt will complete shower transfer with min A using shower bench  OT Short Term Goal 1 - Progress (Week 1): Met OT Short Term Goal 2 (Week 1): Pt will complete LB dressing with use of AE with min A  OT Short Term Goal 2 - Progress (Week 1): Met OT Short Term Goal 3 (Week 1): Pt will tolerate standing for 5 minutes for grooming tasks  OT Short Term Goal 3 - Progress (Week 1): Met OT Short Term Goal 4 (Week 1): Pt will complete toilet transfers with walker with min guard A OT Short Term Goal 4 - Progress (Week 1): Met Week 2:  OT Short Term Goal 1 (Week 2): STG=LTG secondary to ELOS      Therapy Documentation Precautions:  Precautions Precautions: Fall Precaution Comments: No pf/df at R Foot. Required Braces or Orthoses: Other Brace/Splint Other Brace/Splint: R toe up brace Restrictions Weight Bearing Restrictions: No     See Function Navigator for Current Functional Status.     [name] [facility] 05/09/2017,  6:51 AM

## 2017-05-09 NOTE — Progress Notes (Signed)
Physical Therapy Session Note  Patient Details  Name: Jenna Hill MRN: [id number] Date of Birth: 14-Nov-1970  Today's Date: 05/09/2017 PT Individual Time: 1000-1100 PT Individual Time Calculation (min): 60 min   Short Term Goals: Week 2:  PT Short Term Goal 1 (Week 2): =LTGs due to ELOS  Skilled Therapeutic Interventions/Progress Updates:    c/o 7/10 pain, reports premedicated.  Session focus on activity tolerance, gait, and stair negotiation to simulate home set up.    Pt transitions to EOB and dons pants with increased time.  Stand/pivot to w/c on R with RW mod I.  Pt propels w/c throughout unit with BUEs for strengthening and cardiovascular endurance, requiring increased time.    Gait training x61 +75' with RW and distant supervision.  Discussed safety concerns with home entry not having rails for balance, and pt reports she can ask her landlord to install some.  Pt negotiates 4 steps with L ascending rail (BUE support), with min assist and mod cues for leading with the stronger leg to ascend.    PT applied heat to RLE for pain control as pt reports pain has increased to 9/10.  W/c propulsion back to room with verbal cues for increasing stroke length for more efficient propulsion.  Pt positioned EOB with alarm set to await OT.   Therapy Documentation Precautions:  Precautions Precautions: Fall Precaution Comments: No pf/df at R Foot. Required Braces or Orthoses: Other Brace/Splint Other Brace/Splint: R toe up brace Restrictions Weight Bearing Restrictions: No   See Function Navigator for Current Functional Status.   Therapy/Group: Individual Therapy  Michel Santee 05/09/2017, 12:08 PM

## 2017-05-09 NOTE — Progress Notes (Signed)
Occupational Therapy Session Note  Patient Details  Name: Dia Donate MRN: [id number] Date of Birth: 24-Jan-1971  Today's Date: 05/09/2017 OT Individual Time: 1100-1157 OT Individual Time Calculation (min): 57 min    Short Term Goals: Week 2:  OT Short Term Goal 1 (Week 2): STG=LTG secondary to ELOS  Skilled Therapeutic Interventions/Progress Updates:    Pt engaged in BADLs including bathing at shower level and dressing with sit<>stand from EOB.  Pt amb with RW in room to gather clothing prior to walking into bathroom.  Pt completed bathing tasks at supervision level using AE appropriately.  Pt required assistance with R AFO but otherwise completed dressing tasks without assistance.  Pt completed grooming tasks seated in w/c at sink.  Focus on activity tolerance, sit<>stand, standing balance, functional amb with RW, functional transfers, and safety awareness to increase independence with BADLs.   Therapy Documentation Precautions:  Precautions Precautions: Fall Precaution Comments: No pf/df at R Foot. Required Braces or Orthoses: Other Brace/Splint Other Brace/Splint: R toe up brace Restrictions Weight Bearing Restrictions: No   Pain: Pain Assessment Pain Scale: 0-10 Pain Score: 0-No pain  See Function Navigator for Current Functional Status.   Therapy/Group: Individual Therapy  Leroy Libman 05/09/2017, 11:59 AM

## 2017-05-09 NOTE — Progress Notes (Signed)
Occupational Therapy Note  Patient Details  Name: Jenna Hill MRN: [id number] Date of Birth: 1970/11/05  Today's Date: 05/09/2017 OT Individual Time: 9476-5465 OT Individual Time Calculation (min): 45 min   Pt c/o 5/10 pain in RLE; RN aware Individual Therapy  Pt resting in bed upon arrival and agreeable to therapy. Pt declined bathing until later session.  Pt initially engaged in LB dressing tasks and functional amb with RW to access toilet in bathroom.  Pt required assistance donning R AFO.  Pt amb with RW at supervision level.  Pt transferred to w/c and propelled to day room.  Pt engaged in BLE therex on NuStep level 5 for 7 mins to increase BLE strength and independence with BADLs and functional amb for home mgmt tasks.    [name] [facility] 05/09/2017, 11:56 AM

## 2017-05-09 NOTE — Progress Notes (Signed)
Subjective/Complaints: Pt at bedside with OT. Getting dressed. Had a good weekend. Pain seems controlled  ROS: Patient denies fever, rash, sore throat, blurred vision, nausea, vomiting, diarrhea, cough, shortness of breath or chest pain, joint or back pain, headache, or mood change.      Objective: Vital Signs: Blood pressure 117/80, pulse 91, temperature 98 F (36.7 C), temperature source Oral, resp. rate 16, height 5' (1.524 m), weight 108.9 kg (240 lb 1.3 oz), SpO2 91 %. No results found. Results for orders placed or performed during the hospital encounter of 04/29/17 (from the past 72 hour(s))  Urinalysis, Complete w Microscopic     Status: Abnormal   Collection Time: 05/06/17 11:53 AM  Result Value Ref Range   Color, Urine YELLOW YELLOW   APPearance HAZY (A) CLEAR   Specific Gravity, Urine 1.017 1.005 - 1.030   pH 7.0 5.0 - 8.0   Glucose, UA NEGATIVE NEGATIVE mg/dL   Hgb urine dipstick SMALL (A) NEGATIVE   Bilirubin Urine NEGATIVE NEGATIVE   Ketones, ur NEGATIVE NEGATIVE mg/dL   Protein, ur 30 (A) NEGATIVE mg/dL   Nitrite NEGATIVE NEGATIVE   Leukocytes, UA MODERATE (A) NEGATIVE   RBC / HPF 0-5 0 - 5 RBC/hpf   WBC, UA 6-30 0 - 5 WBC/hpf   Bacteria, UA RARE (A) NONE SEEN   Squamous Epithelial / LPF 6-30 (A) NONE SEEN   Trichomonas, UA PRESENT     Comment: Performed at [facility], [address], [place], [place] [address]  Culture, Urine     Status: Abnormal   Collection Time: 05/06/17 12:34 PM  Result Value Ref Range   Specimen Description URINE, CLEAN CATCH    Special Requests NONE    Culture (A)     <10,000 COLONIES/mL INSIGNIFICANT GROWTH Performed at [facility], [place] [address] [place], [place] [address]    Report Status 05/07/2017 FINAL   Basic metabolic panel     Status: Abnormal   Collection Time: 05/09/17  5:48 AM  Result Value Ref Range   Sodium 139 135 - 145 mmol/L   Potassium 3.8 3.5 - 5.1 mmol/L   Chloride 104 101 - 111 mmol/L   CO2  22 22 - 32 mmol/L   Glucose, Bld 104 (H) 65 - 99 mg/dL   BUN 16 6 - 20 mg/dL   Creatinine, Ser 1.91 (H) 0.44 - 1.00 mg/dL   Calcium 8.9 8.9 - 10.3 mg/dL   GFR calc non Af Amer 30 (L) >60 mL/min   GFR calc Af Amer 35 (L) >60 mL/min    Comment: (NOTE) The eGFR has been calculated using the CKD EPI equation. This calculation has not been validated in all clinical situations. eGFR's persistently <60 mL/min signify possible Chronic Kidney Disease.    Anion gap 13 5 - 15    Comment: Performed at [facility] [address], [place], [place] [address]     Constitutional: No distress . Vital signs reviewed. HEENT: EOMI, oral membranes moist Cardiovascular: RRR without murmur. No JVD    Respiratory: CTA Bilaterally without wheezes or rales. Normal effort    GI: BS +, non-tender, non-distended  Skin:   Intact Neuro: oriented.  Abnormal Sensory Reduced sensation dorsum of the left foot as well as little toe.  Motor testing remains trace out of 5 right ankle dorsiflexor plantar flexor.  knee extensor and hip flexor are 4/5. 5/5 bilateral upper limbs and 5/5 in left lower limb.    Musc/Skel:  pain and swelling decreased right foot   Psych: Pleasant and more engaging today.   Assessment/Plan: 1. Functional deficits secondary to rhabdomyolysis, right foot drop which require 3+ hours per day of interdisciplinary therapy in a comprehensive inpatient rehab setting. Physiatrist is providing close team supervision and 24 hour management of active medical problems listed below. Physiatrist and rehab team continue to assess barriers to discharge/monitor patient progress toward functional and medical goals. FIM: Function - Bathing Position: Shower Body parts bathed by patient: Left arm, Chest, Right arm, Buttocks, Right upper leg, Left upper leg, Abdomen, Front perineal area, Right lower leg, Left lower leg Body parts bathed by helper: Back Assist Level: Supervision or verbal cues  Function-  Upper Body Dressing/Undressing What is the patient wearing?: Pull over shirt/dress Pull over shirt/dress - Perfomed by patient: Thread/unthread right sleeve, Thread/unthread left sleeve, Put head through opening, Pull shirt over trunk Assist Level: No help, No cues Function - Lower Body Dressing/Undressing What is the patient wearing?: Pants, Socks Position: Wheelchair/chair at sink Pants- Performed by patient: Pull pants up/down, Thread/unthread right pants leg, Thread/unthread left pants leg Pants- Performed by helper: Thread/unthread right pants leg, Thread/unthread left pants leg Non-skid slipper socks- Performed by patient: Don/doff left sock Non-skid slipper socks- Performed by helper: Don/doff right sock Shoes - Performed by patient: Don/doff left shoe, Fasten left AFO - Performed by patient: Don/doff right AFO TED Hose - Performed by patient: Don/doff left TED hose TED Hose - Performed by helper: Don/doff right TED hose Assist for footwear: Maximal assist(CAM boot) Assist for lower body dressing: Touching or steadying assistance (Pt > 75%) Assistive Device Comment: reacher + sock aide  Function - Toileting Toileting activity did not occur: No continent bowel/bladder event Toileting steps completed by patient: Adjust clothing prior to toileting, Performs perineal hygiene, Adjust clothing after toileting Toileting steps completed by helper: Adjust clothing after toileting Toileting Assistive Devices: Grab bar or rail Assist level: Touching or steadying assistance (Pt.75%)  Function - Air cabin crew transfer assistive device: Grab bar Assist level to toilet: Supervision or verbal cues Assist level from toilet: Supervision or verbal cues  Function - Chair/bed transfer Chair/bed transfer method: Ambulatory Chair/bed transfer assist level: Supervision or verbal cues Chair/bed transfer assistive device: Walker, Armrests Chair/bed transfer details: Tactile cues for  initiation, Tactile cues for sequencing, Verbal cues for technique, Verbal cues for safe use of DME/AE, Manual facilitation for weight shifting  Function - Locomotion: Wheelchair Will patient use wheelchair at discharge?: Yes Type: Manual Max wheelchair distance: 150' Assist Level: Supervision or verbal cues Assist Level: Supervision or verbal cues Assist Level: Supervision or verbal cues Turns around,maneuvers to table,bed, and toilet,negotiates 3% grade,maneuvers on rugs and over doorsills: No Function - Locomotion: Ambulation Assistive device: Walker-rolling Max distance: 75 Assist level: Supervision or verbal cues Assist level: Supervision or verbal cues Walk 50 feet with 2 turns activity did not occur: Safety/medical concerns Assist level: Supervision or verbal cues Walk 150 feet activity did not occur: Safety/medical concerns Walk 10 feet on uneven surfaces activity did not occur: Safety/medical concerns  Function - Comprehension Comprehension: Auditory Comprehension assist level: Follows basic conversation/direction with extra time/assistive device  Function - Expression Expression: Verbal Expression assist level: Expresses basic needs/ideas: With no assist  Function - Social Interaction Social Interaction assist level: Interacts appropriately 90% of the time - Needs monitoring or encouragement for participation or interaction.  Function - Problem Solving Problem solving assist level: Solves basic 90% of the time/requires cueing < 10% of  the time  Function - Memory Memory assist level: Recognizes or recalls 90% of the time/requires cueing < 10% of the time Patient normally able to recall (first 3 days only): Current season, Location of own room, Staff names and faces, That he or she is in a hospital  Medical Problem List and Plan: 1.Functional and mobility deficitssecondary to rhabdomyolysis and encephalopathy. Pt with right foot drop and dysesthesias----?sciatic  nerve injury due to prolonged down time?  . -CIR PT, OT, SLP -foot up AFO has been effective 2. DVT Prophylaxis/Anticoagulation: Pharmaceutical:Heparin 3. Pain Management:Fentanyl patch with tramadol prn -I  reduced gabapentin to 200 mg due to somnolence---dose has been effective 4. Mood:LCSW to follow for evaluation and support. 5. Neuropsych: This patientiscapable of making decisions on herown behalf. 6. Skin/Wound Care:Pressure relief measures. 7. Fluids/Electrolytes/Nutrition:Strict I/O with daily weights.     -Continue to replace potassium  -Diet liberalized with fluid restriction 8. Rhabdomyolysis with acute renal failure: Continue to monitor UOP as well as labs for recovery. SCr trending downwards 11.3-->4.32 > 3.51> 2.9 > 2.68 > 1.91 today 4/8   - all labs reviewed  9. Leucocytosis: White blood cell count up to down to 8.3 4/5   -Urinalysis is positive, culture is pending   -UCX negative  10. Anemia: Will monitor with serial checks. H/H stable in 9-10 range.  11. Abnormal LFTs: Normalizing 12. Schizoaffective/depressive disorder: Continue Clozaril, Sinequan, Seroquel and Trintellix at bedtime.  13. HTN: improved control  - Currently on Metoprolol bid.     -On amlodipine 5 mg, increased to 10 mg 4/3 with improved control overall.  Increased metoprolol to 100 mg twice daily on 4/1   Clonidine as needed for elevated blood pressure     Vitals:   05/08/17 2100 05/09/17 0158  BP: (!) 158/91 117/80  Pulse: (!) 111 91  Resp:  16  Temp:  98 F (36.7 C)  SpO2:  91%    - 14.  Hypokalemia, increased potassium to 20 mEq daily--continue   LOS (Days) 10 A FACE TO FACE EVALUATION WAS PERFORMED  Meredith Staggers 05/09/2017, 10:56 AM

## 2017-05-10 ENCOUNTER — Inpatient Hospital Stay (HOSPITAL_COMMUNITY): Payer: Self-pay

## 2017-05-10 ENCOUNTER — Inpatient Hospital Stay (HOSPITAL_COMMUNITY): Payer: Self-pay | Admitting: Physical Therapy

## 2017-05-10 ENCOUNTER — Encounter (HOSPITAL_COMMUNITY): Payer: Self-pay | Admitting: Psychology

## 2017-05-10 LAB — CBC WITH DIFFERENTIAL/PLATELET
BASOS ABS: 0 10*3/uL (ref 0.0–0.1)
BASOS PCT: 0 %
EOS ABS: 0.1 10*3/uL (ref 0.0–0.7)
EOS PCT: 2 %
HCT: 34.2 % — ABNORMAL LOW (ref 36.0–46.0)
Hemoglobin: 11 g/dL — ABNORMAL LOW (ref 12.0–15.0)
LYMPHS PCT: 38 %
Lymphs Abs: 2.8 10*3/uL (ref 0.7–4.0)
MCH: 30.1 pg (ref 26.0–34.0)
MCHC: 32.2 g/dL (ref 30.0–36.0)
MCV: 93.4 fL (ref 78.0–100.0)
Monocytes Absolute: 0.2 10*3/uL (ref 0.1–1.0)
Monocytes Relative: 3 %
Neutro Abs: 4.2 10*3/uL (ref 1.7–7.7)
Neutrophils Relative %: 57 %
PLATELETS: 336 10*3/uL (ref 150–400)
RBC: 3.66 MIL/uL — AB (ref 3.87–5.11)
RDW: 15 % (ref 11.5–15.5)
WBC: 7.4 10*3/uL (ref 4.0–10.5)

## 2017-05-10 NOTE — Consult Note (Signed)
Neuropsychological Consultation   Patient:   Jenna Hill   DOB:   07/14/1970   MR Number:  [id number]  Location:  [facility] [id number] [place] [address] Dept: [place]: [phone]           Date of Service:   05/09/2017  Start Time:   2:30 PM End Time:   3:30 PM  Provider/Observer:  Ilean Skill, Psy.D.       Clinical Neuropsychologist       Billing Code/Service: [phone] 4 Units  Chief Complaint:    Jenna Hill is a 47 year old female has a prior history of bipolar disorder, schizophrenia, hypertension, and a history of overdose/suicide attempts.  The patient was admitted on 04/05/2017 after being found down.  The patient was obtunded and dried vomitus on cheeks, hypothermic, tachycardic and had AKI.  Urinary drug screen was negative and initially there was concern that encephalopathy was due to possible suicide attempt.  However, further investigation of this issue are minimizing/downplaying the possibility of it being a suicide attempt.  The patient was initially intubated for airway protection and started on IV unasyn for aspiration pneumonia and treated for rhabdomyolysis.  She did require dialysis briefly due to worsening of renal status with volume overload.  The patient was also found to have acute pancreatitis.  Initially, there were ongoing issues with mental status but over time her mental status has improved significantly.  During today's clinical interview the patient openly talked about her prior history of mood disorder and symptoms consistent with schizophrenia.  She reports that the symptoms developed around 47 years of age and she has been treated since that time.  Past medications have included Zyprexa, Seroquel, lithium and Xanax.  Currently, the patient reports that she has been taking Seroquel, Trintellix, which is a serotonin modulating antidepressant, Abilify,  and doxepin.  The patient reports that she is taking both Abilify through injection form as well as maintenance dose.  She has been followed by [facility] Bailey [facility].  Reason for Service:  The patient was referred for neuropsychological consultation due to prior reports of mental status issues and changes although this is clearing as well as a prior psychiatric history.  The concern was that the acute events related to her medical situation could potentially exacerbate her underlying psychiatric condition.  Below is the HPI for the current admission.  HFS:FSELTR Jenna Hill a 47 y.o.femalewith history of bipolar disorder, schizophrenia, HTN , history of overdose/sucide attempts who was admitted after being found down on 04/05/17. Patient was obtunded, with dried vomitus on cheeks, hypothermic, tachycardic and had AKI. UDS negative and encephalopathy felt to be due to suicide attempt. She was intubated for airway protection and started on IV unasyn for aspiration PNA and IVF for AKI with rhabdomyolysis. Sherequired HD briefly due to worsening of renal statuswithvolume overload with oliguria. She tolerated extubation on 3/8with intermittent HD. Urine output is improving and HD has been on hold since 3/20. SCr peaked at 11.3 and has started to trend downwards in the past few days.Anemia has been treated with transfusion, ferrlecit as well as aranesp. Psychiatry consulted for input and felt that AMS likely due to polypharmacy and not SI with recommendations to titrated Clozaril to home dose.   She has had issues with nausea and CT abdomen/pelvis done revealing acute pancreatitis with diffuse subcutaneous and abdominal wall stranding and edema as well as edema of visualized upper thigh--no  drainable fluid collection noted.As patient without abdominal pain andLipase negativesymptoms not felt to be due to pancreatitis. Abnormal LFTs felt to be due to shocked liver and  rhabdomyolysis.She continued to report numbness with dysesthesias right hemibody and has had 2-3 + edema RLE with weakness and foot drop. Dr. Doreatha Martin was consulted due to concerns of compartment syndrome and felt that weakness and pain due to muscle damage from rhabdomyolysis. To use CAM boot/PRAFO to prevent foot drop and EMG/NCS in the future. Medical issues improving but BP remains labile. Patient noted to be limited by RLE weakness as well as debility due to multiple medical issues. CIR recommended due to functional deficits.   Current Status:  The patient reports that she is doing much better with regard to her mental status reporting that her attention and concentration has been improving and her overall cognitive functioning has been returning to baseline.  While the patient's speech was slowed to some degree she was oriented x4.  The patient talked about her long-standing psychiatric issues and feels like she is on a good maintenance dose of medications and they have been effective for managing her underlying psychiatric condition which has included diagnoses of schizophrenia and bipolar disorder.  The patient has been continued on her psychiatric medications.  The patient denies any current manic episodes or depressive episodes and denies any worsening or exacerbation of her underlying psychiatric symptoms related to her diagnosis of schizophrenia.  Behavioral Observation: Jenna Hill  presents as a 47 y.o.-year-old Right African American Female who appeared her stated age. her dress was Appropriate and she was Well Groomed and her manners were Appropriate to the situation.  her participation was indicative of Appropriate and Attentive behaviors.  There were any physical disabilities noted.  she displayed an appropriate level of cooperation and motivation.     Interactions:    Active Appropriate  Attention:   abnormal and attention span appeared shorter than expected for age, the patient did  have adequate attention and concentration that there were some indications of some distraction from internal preoccupations.  Memory:   within normal limits; recent and remote memory intact  Visuo-spatial:  not examined  Speech (Volume):  low  Speech:   normal; normal  Thought Process:  Coherent and Relevant  Though Content:  WNL; not suicidal and not homicidal  Orientation:   person, place, time/date and situation  Judgment:   Good  Planning:   Good  Affect:    Anxious  Mood:    Anxious  Insight:   Good  Intelligence:   normal  Medical History:   Past Medical History:  Diagnosis Date  . Bipolar 1 disorder ([place])   . Difficult intubation   . Hypertension   . PONV (postoperative nausea and vomiting)   . Schizophrenia ([place])   . Spinal headache        Psychiatric History:  The patient does have prior psychiatric diagnoses including bipolar 1 disorder as well as schizophrenia.  Sometimes these diagnoses are joined under the diagnostic heading of schizoaffective disorder.  The patient reports that she has had symptoms that she was aware of since she was about 47 years old.  The patient reports that she has ongoing psychiatric care and is being [facility].  Family Med/Psych History:  Family History  Problem Relation Age of Onset  . Healthy Mother   . Healthy Father   . Healthy Sister     Risk of Suicide/Violence: low the patient denies any suicidal  or homicidal ideation.  Impression/DX:  Jenna Hill is a 47 year old female has a prior history of bipolar disorder, schizophrenia, hypertension, and a history of overdose/suicide attempts.  The patient was admitted on 04/05/2017 after being found down.  The patient was obtunded and dried vomitus on cheeks, hypothermic, tachycardic and had AKI.  Urinary drug screen was negative and initially there was concern that encephalopathy was due to possible suicide attempt.  However, further investigation of this issue  are minimizing/downplaying the possibility of it being a suicide attempt.  The patient was initially intubated for airway protection and started on IV unasyn for aspiration pneumonia and treated for rhabdomyolysis.  She did require dialysis briefly due to worsening of renal status with volume overload.  The patient was also found to have acute pancreatitis.  Initially, there were ongoing issues with mental status but over time her mental status has improved significantly.  During today's clinical interview the patient openly talked about her prior history of mood disorder and symptoms consistent with schizophrenia.  She reports that the symptoms developed around 47 years of age and she has been treated since that time.  Past medications have included Zyprexa, Seroquel, lithium and Xanax.  Currently, the patient reports that she has been taking Seroquel, Trintellix, which is a serotonin modulating antidepressant, Abilify, and doxepin.  The patient reports that she is taking both Abilify through injection form as well as maintenance dose.  She has been followed by [facility] Bailey [facility].  The patient reports that she is doing much better with regard to her mental status reporting that her attention and concentration has been improving and her overall cognitive functioning has been returning to baseline.  While the patient's speech was slowed to some degree she was oriented x4.  The patient talked about her long-standing psychiatric issues and feels like she is on a good maintenance dose of medications and they have been effective for managing her underlying psychiatric condition which has included diagnoses of schizophrenia and bipolar disorder.  The patient has been continued on her psychiatric medications.  The patient denies any current manic episodes or depressive episodes and denies any worsening or exacerbation of her underlying psychiatric symptoms related to her diagnosis of  schizophrenia.   Disposition/Plan:  At this point, the issues related to her underlying psychiatric condition do appear to be well managed and she is doing well.  The patient was very sick metabolically for some time and her mental status/encephalopathy are likely to be related directly to her medical status rather than acute worsening of her condition.  The patient denies any suicidal ideation and denies that the events that led up to this were related to a suicide attempt in any way.  I will continue to be available to consult on this patient as she progresses through the comprehensive inpatient program.  Diagnosis:    History of Schizophrenia and Bipolar Disorder (Schizoaffective Disorder)        Electronically Signed   _______________________ Ilean Skill, Psy.D.

## 2017-05-10 NOTE — Progress Notes (Signed)
Physical Therapy Session Note  Patient Details  Name: Jenna Hill MRN: [id number] Date of Birth: December 02, 1970  Today's Date: 05/10/2017 PT Individual Time:  -      Short Term Goals: Week 2:  PT Short Term Goal 1 (Week 2): =LTGs due to ELOS  Skilled Therapeutic Interventions/Progress Updates:    no c/o pain at rest, but does grimace with weightbearing on RLE.  Session focus on activity tolerance and endurance, balance, and gait training.    Pt propels w/c throughout unit up to 150' mod I for mobility and UE strengthening/cardiovascular endurance.  PT applied theraband to drive wheels for improved efficiency with w/c propulsion with good results.  Pt completes 2 moderately complex peg board designs in standing, first trial with supervision on firm surface, second trial with min assist on foam surface.  Pt requires 2-3 seated rest breaks with each design.  High level gait training through obstacle course focus on compliant surfaces, and negotiating over/around obstacles.  Pt requires cues for sequencing with obstacle course and one standing rest break.  Declining completing course more than 1 time.  Ambulation 41' + 60' with RW and supervision, slow gait pattern.  Returned to room in w/c, ambulated to recliner, and positioned with chair alarm in tact, call bell in reach and needs met.   Therapy Documentation Precautions:  Precautions Precautions: Fall Precaution Comments: No pf/df at R Foot. Required Braces or Orthoses: Other Brace/Splint Other Brace/Splint: R toe up brace Restrictions Weight Bearing Restrictions: No   See Function Navigator for Current Functional Status.   Therapy/Group: Individual Therapy  Michel Santee 05/10/2017, 11:44 AM

## 2017-05-10 NOTE — Progress Notes (Signed)
Occupational Therapy Session Note  Patient Details  Name: Jenna Hill MRN: [id number] Date of Birth: [birth year]  Today's Date: 05/10/2017 OT Individual Time: 8887-5797 OT Individual Time Calculation (min): 43 min    Short Term Goals: Week 2:  OT Short Term Goal 1 (Week 2): STG=LTG secondary to ELOS  Skilled Therapeutic Interventions/Progress Updates:    Pt received supine in bed with no c/o pain. Pt completed bed mobility with mod I, requiring several vc and encouragement to wake up/participate in therapy. Pt completed stand pivot transfer to w/c with mod I and RW. Pt set up at sink to complete bathing, requiring vc for thoroughness and initiation. (S) required during standing level clothing management and peri-hygiene, with vc/education provided re sitting while performing distal LE bathing/dressing. Pt then completed IADL tasks around the room from w/c and standing level. Vc/tactile cues provided during stand to sit in w/c, with pt not checking locks on wheels and performing unsafe transfer. Pt completed 32f of functional mobility with RW, with education provided re fall risk reduction. Pt set up in recliner with breakfast and chair alarm set/ all needs met.   Therapy Documentation Precautions:  Precautions Precautions: Fall Precaution Comments: No pf/df at R Foot. Required Braces or Orthoses: Other Brace/Splint Other Brace/Splint: R toe up brace Restrictions Weight Bearing Restrictions: No Vital Signs: Therapy Vitals Temp: 97.8 F (36.6 C) Temp Source: Oral Pulse Rate: 96 Resp: 18 BP: 112/77 Patient Position (if appropriate): Sitting Oxygen Therapy SpO2: 98 % O2 Device: Room Air Pain: Pain Assessment Pain Scale: 0-10 Pain Score: 0-No pain ADL: ADL ADL Comments: see functional navigator  See Function Navigator for Current Functional Status.   Therapy/Group: Individual Therapy  SCurtis Sites4/10/2017, 3:39 PM

## 2017-05-10 NOTE — Progress Notes (Signed)
Physical Therapy Session Note  Patient Details  Name: Jenna Hill MRN: [id number] Date of Birth: [birth year]  Today's Date: 05/10/2017 PT Individual Time: 1600-1630 PT Individual Time Calculation (min): 30 min   Short Term Goals: Week 2:  PT Short Term Goal 1 (Week 2): =LTGs due to ELOS  Skilled Therapeutic Interventions/Progress Updates:    no c/o pain but reports fatigue.  Session focus on sitting balance and w/c mobility.    Pt dons shorts and L sock/shoe from bed level with increased time.  Pt needed assist to don R sock/shoe with foot up brace.  Pt transfers mod I throughout session.  W/C mobility to [employer]	[place] with occasional rest breaks, focus on UE strengthening, cardiovascular endurance, and recall of technique.  PT provided nearly constant verbal cues for increasing stroke length, and hand positioning on drive wheels to improved propulsion efficiency.  Pt able to propel w/c >500' with supervision.  Returned to room at end of session and pt doffs shorts/shoes/socks with supervision.  Positioned in bed with call bell in reach and needs met.   Therapy Documentation Precautions:  Precautions Precautions: Fall Precaution Comments: No pf/df at R Foot. Required Braces or Orthoses: Other Brace/Splint Other Brace/Splint: R toe up brace Restrictions Weight Bearing Restrictions: No   See Function Navigator for Current Functional Status.   Therapy/Group: Individual Therapy  Michel Santee 05/10/2017, 4:55 PM

## 2017-05-10 NOTE — Progress Notes (Signed)
Subjective/Complaints: No new issues. Slept well.   ROS: Patient denies fever, rash, sore throat, blurred vision, nausea, vomiting, diarrhea, cough, shortness of breath or chest pain, joint or back pain, headache, or mood change.      Objective: Vital Signs: Blood pressure 128/87, pulse 99, temperature 99 F (37.2 C), temperature source Oral, resp. rate 19, height 5' (1.524 m), weight 109 kg (240 lb 4.8 oz), SpO2 96 %. No results found. Results for orders placed or performed during the hospital encounter of 04/29/17 (from the past 72 hour(s))  Basic metabolic panel     Status: Abnormal   Collection Time: 05/09/17  5:48 AM  Result Value Ref Range   Sodium 139 135 - 145 mmol/L   Potassium 3.8 3.5 - 5.1 mmol/L   Chloride 104 101 - 111 mmol/L   CO2 22 22 - 32 mmol/L   Glucose, Bld 104 (H) 65 - 99 mg/dL   BUN 16 6 - 20 mg/dL   Creatinine, Ser 1.91 (H) 0.44 - 1.00 mg/dL   Calcium 8.9 8.9 - 10.3 mg/dL   GFR calc non Af Amer 30 (L) >60 mL/min   GFR calc Af Amer 35 (L) >60 mL/min    Comment: (NOTE) The eGFR has been calculated using the CKD EPI equation. This calculation has not been validated in all clinical situations. eGFR's persistently <60 mL/min signify possible Chronic Kidney Disease.    Anion gap 13 5 - 15    Comment: Performed at [facility] [address] [place], [place] [address]  CBC with Differential/Platelet     Status: Abnormal   Collection Time: 05/10/17  6:37 AM  Result Value Ref Range   WBC 7.4 4.0 - 10.5 K/uL   RBC 3.66 (L) 3.87 - 5.11 MIL/uL   Hemoglobin 11.0 (L) 12.0 - 15.0 g/dL   HCT 34.2 (L) 36.0 - 46.0 %   MCV 93.4 78.0 - 100.0 fL   MCH 30.1 26.0 - 34.0 pg   MCHC 32.2 30.0 - 36.0 g/dL   RDW 15.0 11.5 - 15.5 %   Platelets 336 150 - 400 K/uL   Neutrophils Relative % 57 %   Neutro Abs 4.2 1.7 - 7.7 K/uL   Lymphocytes Relative 38 %   Lymphs Abs 2.8 0.7 - 4.0 K/uL   Monocytes Relative 3 %   Monocytes Absolute 0.2 0.1 - 1.0 K/uL   Eosinophils  Relative 2 %   Eosinophils Absolute 0.1 0.0 - 0.7 K/uL   Basophils Relative 0 %   Basophils Absolute 0.0 0.0 - 0.1 K/uL    Comment: Performed at [facility] [address] [place], [place] [address]     Constitutional: No distress . Vital signs reviewed. HEENT: EOMI, oral membranes moist Cardiovascular: RRR without murmur. No JVD    Respiratory: CTA Bilaterally without wheezes or rales. Normal effort    GI: BS +, non-tender, non-distended  Skin:   Intact Neuro: oriented.  Abnormal Sensory Reduced sensation dorsum of the left foot as well as little toe.  Motor: remains trace out of 5 right ankle dorsiflexor plantar flexor.  knee extensor and hip flexor are 4/5. 5/5 bilateral upper limbs and 5/5 in left lower limb.    Musc/Skel: pain and swelling decreased right foot   Psych: cooperative, flat  Assessment/Plan: 1. Functional deficits secondary to rhabdomyolysis, right foot drop which require 3+ hours per day of interdisciplinary therapy in a comprehensive inpatient rehab setting. Physiatrist is providing close team supervision and 24  hour management of active medical problems listed below. Physiatrist and rehab team continue to assess barriers to discharge/monitor patient progress toward functional and medical goals. FIM: Function - Bathing Position: Shower Body parts bathed by patient: Left arm, Chest, Right arm, Buttocks, Right upper leg, Left upper leg, Abdomen, Front perineal area, Right lower leg, Left lower leg Body parts bathed by helper: Back Assist Level: Supervision or verbal cues  Function- Upper Body Dressing/Undressing What is the patient wearing?: Pull over shirt/dress Pull over shirt/dress - Perfomed by patient: Thread/unthread right sleeve, Thread/unthread left sleeve, Put head through opening, Pull shirt over trunk Assist Level: No help, No cues Function - Lower Body Dressing/Undressing What is the patient wearing?: Pants, Socks, Shoes, AFO Position: Sitting  EOB Pants- Performed by patient: Pull pants up/down, Thread/unthread right pants leg, Thread/unthread left pants leg Pants- Performed by helper: Thread/unthread right pants leg, Thread/unthread left pants leg Non-skid slipper socks- Performed by patient: Don/doff left sock Non-skid slipper socks- Performed by helper: Don/doff right sock Socks - Performed by patient: Don/doff right sock, Don/doff left sock Shoes - Performed by patient: Don/doff right shoe, Don/doff left shoe, Fasten right, Fasten left AFO - Performed by patient: Don/doff right AFO AFO - Performed by helper: Don/doff right AFO TED Hose - Performed by patient: Don/doff left TED hose TED Hose - Performed by helper: Don/doff right TED hose Assist for footwear: Maximal assist(CAM boot) Assist for lower body dressing: Touching or steadying assistance (Pt > 75%) Assistive Device Comment: reacher + sock aide  Function - Toileting Toileting activity did not occur: No continent bowel/bladder event Toileting steps completed by patient: Adjust clothing prior to toileting, Performs perineal hygiene, Adjust clothing after toileting Toileting steps completed by helper: Adjust clothing after toileting Toileting Assistive Devices: Grab bar or rail Assist level: Touching or steadying assistance (Pt.75%)  Function - Air cabin crew transfer assistive device: Grab bar Assist level to toilet: Supervision or verbal cues Assist level from toilet: Supervision or verbal cues  Function - Chair/bed transfer Chair/bed transfer method: Ambulatory Chair/bed transfer assist level: Supervision or verbal cues Chair/bed transfer assistive device: Walker, Armrests Chair/bed transfer details: Tactile cues for initiation, Tactile cues for sequencing, Verbal cues for technique, Verbal cues for safe use of DME/AE, Manual facilitation for weight shifting  Function - Locomotion: Wheelchair Will patient use wheelchair at discharge?: Yes Type:  Manual Max wheelchair distance: 150' Assist Level: Supervision or verbal cues Assist Level: Supervision or verbal cues Assist Level: Supervision or verbal cues Turns around,maneuvers to table,bed, and toilet,negotiates 3% grade,maneuvers on rugs and over doorsills: No Function - Locomotion: Ambulation Assistive device: Walker-rolling Max distance: 75 Assist level: Supervision or verbal cues Assist level: Supervision or verbal cues Walk 50 feet with 2 turns activity did not occur: Safety/medical concerns Assist level: Supervision or verbal cues Walk 150 feet activity did not occur: Safety/medical concerns Walk 10 feet on uneven surfaces activity did not occur: Safety/medical concerns  Function - Comprehension Comprehension: Auditory Comprehension assist level: Follows basic conversation/direction with extra time/assistive device  Function - Expression Expression: Verbal Expression assist level: Expresses basic needs/ideas: With no assist  Function - Social Interaction Social Interaction assist level: Interacts appropriately 90% of the time - Needs monitoring or encouragement for participation or interaction.  Function - Problem Solving Problem solving assist level: Solves basic 90% of the time/requires cueing < 10% of the time  Function - Memory Memory assist level: Recognizes or recalls 90% of the time/requires cueing < 10% of the time Patient normally  able to recall (first 3 days only): Current season, Location of own room, Staff names and faces, That he or she is in a hospital  Medical Problem List and Plan: 1.Functional and mobility deficitssecondary to rhabdomyolysis and encephalopathy. Pt with right foot drop and dysesthesias----?sciatic nerve injury due to prolonged down time?  . -CIR PT, OT, SLP -foot up AFO has been effective, team conf today 2. DVT Prophylaxis/Anticoagulation: Pharmaceutical:Heparin 3. Pain Management:Fentanyl patch with  tramadol prn - gabapentin  200 mg for neuropathic right foot pain 4. Mood:LCSW to follow for evaluation and support. 5. Neuropsych: This patientiscapable of making decisions on herown behalf. 6. Skin/Wound Care:Pressure relief measures. 7. Fluids/Electrolytes/Nutrition:Strict I/O with daily weights.     -Continue to replace potassium  -Diet liberalized with fluid restriction 8. Rhabdomyolysis with acute renal failure: Continue to monitor UOP as well as labs for recovery. SCr trending downwards 11.3-->4.32 > 3.51> 2.9 > 2.68 > 1.91 4/8      9. Leucocytosis: White blood cell count up to down to 8.3 4/5   -Urinalysis is positive    -UCX negative  10. Anemia: Will monitor with serial checks. H/H up to 11k today.  11. Abnormal LFTs: Normalizing 12. Schizoaffective/depressive disorder: Continue Clozaril, Sinequan, Seroquel and Trintellix at bedtime.  13. HTN: improved control overall  - Currently on Metoprolol bid.     -On amlodipine 5 mg, increased to 10 mg 4/3 with improved control overall.  Increased metoprolol to 100 mg twice daily on 4/1   Clonidine as needed for elevated blood pressure     Vitals:   05/09/17 2300 05/10/17 0529  BP: (!) 137/94 128/87  Pulse:  99  Resp:  19  Temp:  99 F (37.2 C)  SpO2:  96%    - 14.  Hypokalemia, increased potassium to 20 mEq daily--continue   LOS (Days) 11 A FACE TO FACE EVALUATION WAS PERFORMED  Meredith Staggers 05/10/2017, 8:57 AM

## 2017-05-10 NOTE — Progress Notes (Signed)
Occupational Therapy Note  Patient Details  Name: Jenna Hill MRN: [id number] Date of Birth: [birth year]  Today's Date: 05/10/2017 OT Individual Time: 1300-1355 OT Individual Time Calculation (min): 55 min   Pt denies pain Individual Therapy  Pt resting in recliner upon arrival.  Pt declined bathing/dressing this afternoon but agreeable to participating in therapy.  Pt propelled w/c to day room and engaged in dynamic standing tasks including Wii bowling, tennis, and baseball and [employer] tasks.  Pt required multiple rest breaks during session and c/o RLE fatigue. Focus on activity tolerance, standing balance, and safety awareness to increase independence with BADLs.    [name] [facility] 05/10/2017, 2:57 PM

## 2017-05-11 ENCOUNTER — Inpatient Hospital Stay (HOSPITAL_COMMUNITY): Payer: Self-pay | Admitting: Physical Therapy

## 2017-05-11 ENCOUNTER — Inpatient Hospital Stay (HOSPITAL_COMMUNITY): Payer: Self-pay

## 2017-05-11 MED ORDER — GABAPENTIN 300 MG PO CAPS
300.0000 mg | ORAL_CAPSULE | Freq: Every day | ORAL | Status: DC
  Administered 2017-05-11: 300 mg via ORAL
  Filled 2017-05-11: qty 1

## 2017-05-11 NOTE — Progress Notes (Signed)
Social Work Patient ID: Jenna Hill, female   DOB: 1970/05/09, 47 y.o.   MRN: [id number]   CSW met with pt to update her on team conference discussion and pt feels ready to go home tomorrow.  Pt's mother and friend have both called CSW to make sure that pt is going to meet mod I goals and CSW told them that therapy team stated she will.  CSW is requesting [facility]) for pt.  Pt is not eligible for home therapies due to not having a qualifying diagnosis for Medicaid.  Therapists to give pt home exercise program.  CSW to order recommended DME.  CSW will continue to follow and assist as needed.

## 2017-05-11 NOTE — Progress Notes (Signed)
Occupational Therapy Session Note  Patient Details  Name: Jenna Hill MRN: [id number] Date of Birth: 24-Nov-1970  Today's Date: 05/11/2017 OT Individual Time: 1030-1200 OT Individual Time Calculation (min): 90 min    Short Term Goals: Week 2:  OT Short Term Goal 1 (Week 2): STG=LTG secondary to ELOS   Skilled Therapeutic Interventions/Progress Updates:    OT intervention with ongoing focus on bathing at shower level, dressing with sit<>stand from standard chair, toileting, functional transfers, and safety awareness.  Pt completed all tasks at mod I level requiring more than a reasonable amount of time.  Pt requires more than a reasonable amount of time to transition between tasks.Pt also engaged in simple kitchen tasks and simple meal prep using stove top.  Home setup facilitates sliding containers/plates along counter top to eating space at end of counter.  Pt also engaged in dynamic standing tasks to complete table tasks with focus on standing balance and endurance.  Pt returned to room and remained in w/c with all needs within reach.   Therapy Documentation Precautions:  Precautions Precautions: Fall Precaution Comments: R foot drop Required Braces or Orthoses: Other Brace/Splint Other Brace/Splint: R foot up brace Restrictions Weight Bearing Restrictions: No   Pain: Pain Assessment Pain Scale: 0-10 Pain Score: 8  Pain Type: Acute pain Pain Location: Foot Pain Orientation: Right Pain Descriptors / Indicators: Burning Pain Frequency: Constant Patients Stated Pain Goal: 4 Pain Intervention(s): Medication (See eMAR)  See Function Navigator for Current Functional Status.   Therapy/Group: Individual Therapy  Leroy Libman 05/11/2017, 12:05 PM

## 2017-05-11 NOTE — Progress Notes (Signed)
Occupational Therapy Discharge Summary  Patient Details  Name: Jenna Hill MRN: [id number] Date of Birth: Apr 29, 1970   Patient has met 10 of 10 long term goals due to improved activity tolerance, improved balance, postural control and ability to compensate for deficits.  Pt made steady progress with BADLs and IADLs during this admission.  Pt is mod I for bathing/dressing tasks, functional transfers, and toileting.  Pt is also mod I for simple kitchen tasks/meal prep. Family has not been present for education but pt states that she has friends who will "check in" with her. Patient to discharge at overall Modified Independent level.     Recommendation:  Patient will benefit from continued OT services to address dynamic balance and activity tolerance.  Equipment: Tub Producer, television/film/video  Reasons for discharge: treatment goals met and discharge from hospital  Patient/family agrees with progress made and goals achieved: Yes  OT Discharge Vision Baseline Vision/History: Wears glasses Wears Glasses: At all times Patient Visual Report: No change from baseline   Cognition Overall Cognitive Status: No family/caregiver present to determine baseline cognitive functioning Arousal/Alertness: Awake/alert Orientation Level: Oriented X4 Attention: Focused Focused Attention: Appears intact Memory: Impaired Awareness: Appears intact Problem Solving: Impaired Safety/Judgment: Appears intact Sensation Sensation Light Touch: Appears Intact Light Touch Impaired Details: Impaired RLE;Impaired LLE Proprioception: Appears Intact Coordination Gross Motor Movements are Fluid and Coordinated: Yes Fine Motor Movements are Fluid and Coordinated: Yes Coordination and Movement Description: decreased coordination in RLE Motor  Motor Motor: Within Functional Limits Motor - Discharge Observations: ongoing R foot drop with trace activation of R ant tib Mobility  Bed Mobility Bed Mobility: Rolling  Right;Rolling Left;Supine to Sit;Sit to Supine Rolling Right: 6: Modified independent (Device/Increase time) Rolling Left: 6: Modified independent (Device/Increase time) Supine to Sit: 6: Modified independent (Device/Increase time) Sitting - Scoot to Edge of Bed: 6: Modified independent (Device/Increase time) Sit to Supine: 6: Modified independent (Device/Increase time) Transfers Sit to Stand: 6: Modified independent (Device/Increase time) Stand to Sit: 6: Modified independent (Device/Increase time)  Trunk/Postural Assessment  Cervical Assessment Cervical Assessment: Exceptions to WFL(foreard head, rounder shoulders) Thoracic Assessment Thoracic Assessment: Within Functional Limits Lumbar Assessment Lumbar Assessment: Exceptions to WFL(posterior pelvic tilt) Postural Control Postural Control: Within Functional Limits  Balance Static Sitting Balance Static Sitting - Level of Assistance: 6: Modified independent (Device/Increase time) Dynamic Sitting Balance Dynamic Sitting - Level of Assistance: 6: Modified independent (Device/Increase time) Extremity/Trunk Assessment RUE Assessment RUE Assessment: Within Functional Limits LUE Assessment LUE Assessment: Within Functional Limits   See Function Navigator for Current Functional Status.  [name] [facility] 05/11/2017, 11:30 AM

## 2017-05-11 NOTE — Progress Notes (Signed)
Discussed patient with Dr. Posey Pronto (nephrology). He reviewed her chart and feels that patient does not need follow up as renal status showed much improvement. Primary MD can refer as needed and FR can be d/c.

## 2017-05-11 NOTE — Patient Care Conference (Signed)
Inpatient RehabilitationTeam Conference and Plan of Care Update Date: 05/10/2017   Time: 2:00 PM    Patient Name: Jenna Hill      Medical Record Number: [id number]  Date of Birth: Jun 13, 1970 Sex: Female         Room/Bed: 4W10C/4W10C-01 Payor Info: Payor: MEDICAID [place] / Plan: MEDICAID [place] ACCESS / Product Type: *No Product type* /    Admitting Diagnosis: debility  Admit Date/Time:  04/29/2017  4:47 PM Admission Comments: No comment available   Primary Diagnosis:  Rhabdomyolysis Principal Problem: Rhabdomyolysis  Patient Active Problem List   Diagnosis Date Noted  . 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 AKI (acute kidney injury) ([place]) 04/25/2017  . Shock liver 04/10/2017  . Rhabdomyolysis 04/10/2017  . Bipolar 1 disorder ([place]) 04/10/2017  . Acute respiratory failure ([place])   . Acute encephalopathy 04/05/2017  . Pituitary microadenoma ([place]) 04/25/2015  . OSA (obstructive sleep apnea) 04/16/2015  . Obesity 04/16/2015  . Schizoaffective disorder, depressive type ([place]) 04/15/2015  . Hypertension 10/05/2012    Expected Discharge Date: Expected Discharge Date: 05/12/17  Team Members Present: Physician leading conference: Dr. Alger Simons Social Worker Present: Lennart Pall, LCSW Nurse Present: Junius Creamer, RN PT Present: Dwyane Dee, PT OT Present: Roanna Epley, [place], OT SLP Present: Charolett Bumpers, SLP PPS Coordinator present : Daiva Nakayama, RN, CRRN     Current Status/Progress Goal Weekly Team Focus  Medical   Patient making some progress with pain.  Reduced dose of medication has allowed her to be more alert.  Renal function continues to improve.  Blood pressure has been trending down but still can be labile at times  Continue to optimize blood pressure and kidney function  Pain control, blood pressure management, renal management   Bowel/Bladder   continent of bowel and bladder LBM 4-7  remain continent of bowel and bladder  Assist with  tolieting needs prn   Swallow/Nutrition/ Hydration             ADL's   bathing-supervision; dressing-supervision except assist with toe-up; functional transfers-supervisoin  mod I overall  activity tolerance, safety awareness, standing balance, education   Mobility   supervision overall, progressing towards mod I  mod I  progressing all functional mobility, endurance, balance, carryover   Communication             Safety/Cognition/ Behavioral Observations            Pain   pt c/o pain in right leg/foot of 9-10, minimal relief with prn tramadol q 12 and scheduled tylenoly  pain <6  Assess pain q shift and prn, and adminster pain meds as order   Skin   RLE edema  no new skin breakdown/infection  Assess skin q shift and prn    Rehab Goals Patient on target to meet rehab goals: Yes Rehab Goals Revised: none *See Care Plan and progress notes for long and short-term goals.     Barriers to Discharge  Current Status/Progress Possible Resolutions Date Resolved   Physician    Medical stability        See rehab progress notes for medical management considerations      Nursing                  PT                    OT                  SLP  SW                Discharge Planning/Teaching Needs:  Pt plans to return to her home independently with intermittent assist from friends.  Pt will be independent in her care.   Team Discussion:  Pt's kidney function has improved and she is doing better behaviorally, still with poor safety awareness.  Pt c/o pain with weightbearing on right side.  Pt is distant supervision and is on track to meet mod I goals.  Team with concerns of home entry and pt is supposed to be talking with landlord re: putting rail up by stairs.  With a rail, pt is mod I for stairs.  Revisions to Treatment Plan:  none    Continued Need for Acute Rehabilitation Level of Care: The patient requires daily medical management by a physician with specialized  training in physical medicine and rehabilitation for the following conditions: Daily direction of a multidisciplinary physical rehabilitation program to ensure safe treatment while eliciting the highest outcome that is of practical value to the patient.: Yes Daily medical management of patient stability for increased activity during participation in an intensive rehabilitation regime.: Yes Daily analysis of laboratory values and/or radiology reports with any subsequent need for medication adjustment of medical intervention for : Neurological problems;Renal problems  Dimitrius Steedman, Silvestre Mesi 05/11/2017, 10:38 AM

## 2017-05-11 NOTE — Progress Notes (Signed)
Subjective/Complaints: Pt slept without issues. Pain in right foot with weight bearing. Doing fairly well with foot at rest  ROS: Patient denies fever, rash, sore throat, blurred vision, nausea, vomiting, diarrhea, cough, shortness of breath or chest pain, joint or back pain, headache, or mood change.     Objective: Vital Signs: Blood pressure 109/76, pulse 92, temperature 99 F (37.2 C), temperature source Oral, resp. rate 18, height 5' (1.524 m), weight 109.1 kg (240 lb 8.4 oz), SpO2 98 %. No results found. Results for orders placed or performed during the hospital encounter of 04/29/17 (from the past 72 hour(s))  Basic metabolic panel     Status: Abnormal   Collection Time: 05/09/17  5:48 AM  Result Value Ref Range   Sodium 139 135 - 145 mmol/L   Potassium 3.8 3.5 - 5.1 mmol/L   Chloride 104 101 - 111 mmol/L   CO2 22 22 - 32 mmol/L   Glucose, Bld 104 (H) 65 - 99 mg/dL   BUN 16 6 - 20 mg/dL   Creatinine, Ser 1.91 (H) 0.44 - 1.00 mg/dL   Calcium 8.9 8.9 - 10.3 mg/dL   GFR calc non Af Amer 30 (L) >60 mL/min   GFR calc Af Amer 35 (L) >60 mL/min    Comment: (NOTE) The eGFR has been calculated using the CKD EPI equation. This calculation has not been validated in all clinical situations. eGFR's persistently <60 mL/min signify possible Chronic Kidney Disease.    Anion gap 13 5 - 15    Comment: Performed at [facility] [address], [place], [place] [address]  CBC with Differential/Platelet     Status: Abnormal   Collection Time: 05/10/17  6:37 AM  Result Value Ref Range   WBC 7.4 4.0 - 10.5 K/uL   RBC 3.66 (L) 3.87 - 5.11 MIL/uL   Hemoglobin 11.0 (L) 12.0 - 15.0 g/dL   HCT 34.2 (L) 36.0 - 46.0 %   MCV 93.4 78.0 - 100.0 fL   MCH 30.1 26.0 - 34.0 pg   MCHC 32.2 30.0 - 36.0 g/dL   RDW 15.0 11.5 - 15.5 %   Platelets 336 150 - 400 K/uL   Neutrophils Relative % 57 %   Neutro Abs 4.2 1.7 - 7.7 K/uL   Lymphocytes Relative 38 %   Lymphs Abs 2.8 0.7 - 4.0 K/uL   Monocytes Relative 3 %   Monocytes Absolute 0.2 0.1 - 1.0 K/uL   Eosinophils Relative 2 %   Eosinophils Absolute 0.1 0.0 - 0.7 K/uL   Basophils Relative 0 %   Basophils Absolute 0.0 0.0 - 0.1 K/uL    Comment: Performed at [facility] [address] [place], [place] [address]     Constitutional: No distress . Vital signs reviewed. HEENT: EOMI, oral membranes moist Cardiovascular: RRR without murmur. No JVD    Respiratory: CTA Bilaterally without wheezes or rales. Normal effort    GI: BS +, non-tender, non-distended  Skin:   Intact Neuro: oriented.  Abnormal Sensory Reduced sensation dorsum of the left foot as well as little toe.  Motor: remains trace out of 5 right ankle dorsiflexor plantar flexor.  knee extensor and hip flexor are 4/5. 5/5 bilateral upper limbs and 5/5 in left lower limb.    Musc/Skel: foot less tender to palpation  Psych: cooperative, flat  Assessment/Plan: 1. Functional deficits secondary to rhabdomyolysis, right foot drop which require 3+ hours per day of interdisciplinary therapy in a comprehensive inpatient rehab setting.  Physiatrist is providing close team supervision and 24 hour management of active medical problems listed below. Physiatrist and rehab team continue to assess barriers to discharge/monitor patient progress toward functional and medical goals. FIM: Function - Bathing Position: Wheelchair/chair at sink Body parts bathed by patient: Left arm, Chest, Right arm, Buttocks, Right upper leg, Left upper leg, Abdomen, Front perineal area, Right lower leg, Left lower leg Body parts bathed by helper: Back Assist Level: Supervision or verbal cues  Function- Upper Body Dressing/Undressing What is the patient wearing?: Pull over shirt/dress Pull over shirt/dress - Perfomed by patient: Thread/unthread right sleeve, Thread/unthread left sleeve, Put head through opening, Pull shirt over trunk Assist Level: No help, No cues Function - Lower Body  Dressing/Undressing What is the patient wearing?: Pants, Socks, Shoes, AFO Position: Wheelchair/chair at sink Pants- Performed by patient: Pull pants up/down, Thread/unthread right pants leg, Thread/unthread left pants leg Pants- Performed by helper: Thread/unthread right pants leg, Thread/unthread left pants leg Non-skid slipper socks- Performed by patient: Don/doff left sock Non-skid slipper socks- Performed by helper: Don/doff right sock Socks - Performed by patient: Don/doff right sock, Don/doff left sock Shoes - Performed by patient: Don/doff right shoe, Don/doff left shoe, Fasten right, Fasten left AFO - Performed by patient: Don/doff right AFO AFO - Performed by helper: Don/doff right AFO TED Hose - Performed by patient: Don/doff left TED hose TED Hose - Performed by helper: Don/doff right TED hose Assist for footwear: Setup Assist for lower body dressing: Supervision or verbal cues Assistive Device Comment: reacher + sock aide  Function - Toileting Toileting activity did not occur: No continent bowel/bladder event Toileting steps completed by patient: Adjust clothing prior to toileting, Performs perineal hygiene, Adjust clothing after toileting Toileting steps completed by helper: Adjust clothing after toileting Toileting Assistive Devices: Grab bar or rail Assist level: Touching or steadying assistance (Pt.75%)  Function - Air cabin crew transfer assistive device: Grab bar Assist level to toilet: Supervision or verbal cues Assist level from toilet: Supervision or verbal cues  Function - Chair/bed transfer Chair/bed transfer method: Ambulatory, Stand pivot Chair/bed transfer assist level: Supervision or verbal cues Chair/bed transfer assistive device: Walker, Armrests Chair/bed transfer details: Tactile cues for initiation, Tactile cues for sequencing, Verbal cues for technique, Verbal cues for safe use of DME/AE, Manual facilitation for weight shifting  Function -  Locomotion: Wheelchair Will patient use wheelchair at discharge?: Yes Type: Manual Max wheelchair distance: 150' Assist Level: Supervision or verbal cues Assist Level: Supervision or verbal cues Assist Level: Supervision or verbal cues Turns around,maneuvers to table,bed, and toilet,negotiates 3% grade,maneuvers on rugs and over doorsills: No Function - Locomotion: Ambulation Assistive device: Walker-rolling Max distance: 75 Assist level: Supervision or verbal cues Assist level: Supervision or verbal cues Walk 50 feet with 2 turns activity did not occur: Safety/medical concerns Assist level: Supervision or verbal cues Walk 150 feet activity did not occur: Safety/medical concerns Walk 10 feet on uneven surfaces activity did not occur: Safety/medical concerns Assist level: Supervision or verbal cues  Function - Comprehension Comprehension: Auditory Comprehension assist level: Follows basic conversation/direction with extra time/assistive device  Function - Expression Expression: Verbal Expression assist level: Expresses basic needs/ideas: With no assist  Function - Social Interaction Social Interaction assist level: Interacts appropriately 90% of the time - Needs monitoring or encouragement for participation or interaction.  Function - Problem Solving Problem solving assist level: Solves basic 90% of the time/requires cueing < 10% of the time  Function - Memory Memory assist level: Recognizes  or recalls 90% of the time/requires cueing < 10% of the time Patient normally able to recall (first 3 days only): Current season, Location of own room, Staff names and faces, That he or she is in a hospital  Medical Problem List and Plan: 1.Functional and mobility deficitssecondary to rhabdomyolysis and encephalopathy. Pt with right foot drop and dysesthesias----?sciatic nerve injury due to prolonged down time?  . -CIR PT, OT, SLP -foot up AFO has been effective,   2. DVT Prophylaxis/Anticoagulation: Pharmaceutical:Heparin 3. Pain Management:Fentanyl patch with tramadol prn - increase gabapentin to 344m qhs given improvement in renal function 4. Mood:LCSW to follow for evaluation and support. 5. Neuropsych: This patientiscapable of making decisions on herown behalf. 6. Skin/Wound Care:Pressure relief measures. 7. Fluids/Electrolytes/Nutrition:Strict I/O with daily weights.     -Continue to replace potassium  -Diet liberalized with fluid restriction 8. Rhabdomyolysis with acute renal failure: Continue to monitor UOP as well as labs for recovery. SCr trending downwards 11.3-->4.32 > 3.51> 2.9 > 2.68 > 1.91 4/8      9. Leucocytosis: White blood cell count up to down to 8.3 4/5   -Urinalysis is positive    -UCX negative  10. Anemia: Will monitor with serial checks. H/H up to 11k today.  11. Abnormal LFTs: Normalizing 12. Schizoaffective/depressive disorder: Continue Clozaril, Sinequan, Seroquel and Trintellix at bedtime.  13. HTN: much improved control 4/10  - Currently on Metoprolol bid.     -On amlodipine 5 mg, increased to 10 mg 4/3  .  Increased metoprolol to 100 mg twice daily on 4/1   Clonidine as needed for elevated blood pressure     Vitals:   05/10/17 2005 05/11/17 0531  BP: 129/85 109/76  Pulse: (!) 102 92  Resp:  18  Temp:  99 F (37.2 C)  SpO2:  98%    - 14.  Hypokalemia, increased potassium to 20 mEq daily--continue   LOS (Days) 12 A FACE TO FACE EVALUATION WAS PERFORMED  ZMeredith Staggers4/11/2017, 8:56 AM

## 2017-05-11 NOTE — Progress Notes (Signed)
Physical Therapy Discharge Summary  Patient Details  Name: Jenna Hill MRN: [id number] Date of Birth: 02-27-1970  Today's Date: 05/11/2017 PT Individual Time: 7989-2119 and 4174-0814 PT Individual Time Calculation (min): 40 min and 40 min    Patient has met 7 of 8 long term goals due to improved activity tolerance, improved balance, improved postural control, increased strength, ability to compensate for deficits, improved attention and improved awareness.  Patient to discharge at an ambulatory level mod I for short distances, will be mod I w/c level for longer community distances.   Patient's care partner not present for education, provided with handout for providing appropriate assist on stairs at home.    Reasons goals not met: Pt able to negotiate stairs with at least 1 hand rail with min assist, however, requires up to +2 assist for stair negotiation without rails.  Pt continues to demonstrate impulsive behavior on stairs and is a high fall risk on stairs without a rail.  Reiterated to patient need for land lord to install a rail and to have at least 2 people to assist her into her home at d/c.  She verbalized understanding.   Recommendation:  Patient will benefit from ongoing skilled PT services in home health setting to continue to advance safe functional mobility, address ongoing impairments in balance, strength, and activity tolerance, and minimize fall risk.  Equipment: RW and w/c  Reasons for discharge: treatment goals met  Patient/family agrees with progress made and goals achieved: Yes   Skilled PT Intervention: Session 1: no c/o pain at rest.  Session focus on d/c assessment and functional mobility.  Pt c/o feeling "swimmy headed" after car transfer, BP assessed and 111/86.  Pt states that symptoms do not subside with seated rest break.  Pt propelled w/c back to room mod I and positioned upright in w/c to await OT.    Session 2: session focus on gait and problem solving  home entry.  Pt ambulates up to 19' with RW mod I.  No c/o dizziness this session.  Attempted stair negotiation with +1 HHA on R, however pt unable to release LUE from rail despite max cues.  Attempted stair negotiation with +2 HHA, but pt attempting to negotiate stairs with alternating pattern, and not seeming to hear/process therapist instructions for step-to pattern leading with strong LE.  PT provided pt with hand out in her walker bag detailing stair negotiation for family/friends at d/c.  Also reminded pt to follow up with land lord about rail, and with friend picking her up tomorrow to bring a second person for stair negotiation at home.  Pt returned to bed at end of session and positioned with alarm activated, call bell in reach, and needs met.   PT Discharge Precautions/Restrictions Precautions Precaution Comments: R foot drop Required Braces or Orthoses: Other Brace/Splint Other Brace/Splint: R foot up brace Restrictions Weight Bearing Restrictions: No  Cognition  Awake, oriented x4 Sensation Sensation Light Touch: Appears Intact(LEs) Coordination Gross Motor Movements are Fluid and Coordinated: No Fine Motor Movements are Fluid and Coordinated: Yes Coordination and Movement Description: decreased coordination in RLE Motor  Motor Motor: Within Functional Limits Motor - Discharge Observations: ongoing R foot drop with trace activation of R ant tib  Mobility Bed Mobility Bed Mobility: Rolling Right;Rolling Left;Supine to Sit;Sit to Supine Rolling Right: 6: Modified independent (Device/Increase time) Rolling Left: 6: Modified independent (Device/Increase time) Supine to Sit: 6: Modified independent (Device/Increase time) Sitting - Scoot to Edge of Bed: 6: Modified independent (Device/Increase  time) Sit to Supine: 6: Modified independent (Device/Increase time) Transfers Transfers: Yes Sit to Stand: 6: Modified independent (Device/Increase time) Stand to Sit: 6: Modified  independent (Device/Increase time) Stand Pivot Transfers: 6: Modified independent (Device/Increase time)         Locomotion  Ambulation Ambulation: Yes Ambulation/Gait Assistance: 6: Modified independent (Device/Increase time) Ambulation Distance (Feet): 60 Feet Assistive device: Rolling walker Gait Gait Pattern: Step-to pattern Stairs / Additional Locomotion Stairs: Yes Stairs Assistance: 4: Min guard Stair Management Technique: Two rails;Step to pattern;Forwards Number of Stairs: 4 Architect: Yes Wheelchair Assistance: 6: Modified independent (Device/Increase time) Environmental health practitioner: Both upper extremities Wheelchair Parts Management: Supervision/cueing Distance: 150  Trunk/Postural Assessment  Cervical Assessment Cervical Assessment: (forward head, rounded shoulders) Thoracic Assessment Thoracic Assessment: Within Functional Limits Lumbar Assessment Lumbar Assessment: (preference for posterior pelvic tilt ) Postural Control Postural Control: Within Functional Limits  Balance Balance Balance Assessed: Yes Static Sitting Balance Static Sitting - Balance Support: No upper extremity supported;Feet supported Static Sitting - Level of Assistance: 7: Independent Dynamic Sitting Balance Dynamic Sitting - Balance Support: No upper extremity supported;Feet supported;During functional activity Dynamic Sitting - Level of Assistance: 6: Modified independent (Device/Increase time) Static Standing Balance Static Standing - Balance Support: Bilateral upper extremity supported;During functional activity Static Standing - Level of Assistance: 6: Modified independent (Device/Increase time) Dynamic Standing Balance Dynamic Standing - Balance Support: Right upper extremity supported Dynamic Standing - Level of Assistance: 6: Modified independent (Device/Increase time) Extremity Assessment      RLE Assessment RLE Assessment: Exceptions to  [facility] RLE AROM (degrees) Overall AROM Right Lower Extremity: Due to decreased strength RLE Strength RLE Overall Strength: Due to pain RLE Overall Strength Comments: limited by pain Right Hip Flexion: 3+/5 Right Knee Flexion: 3+/5 Right Knee Extension: 3+/5 Right Ankle Dorsiflexion: 1/5 Right Ankle Plantar Flexion: 0/5 LLE Assessment LLE Assessment: Exceptions to [facility] LLE Strength Left Hip Flexion: 4/5 Left Knee Flexion: 4/5 Left Knee Extension: 4/5 Left Ankle Dorsiflexion: 4/5 Left Ankle Plantar Flexion: 4/5   See Function Navigator for Current Functional Status.  Michel Santee 05/11/2017, 5:58 PM

## 2017-05-12 ENCOUNTER — Telehealth: Payer: Self-pay | Admitting: *Deleted

## 2017-05-12 MED ORDER — DICLOFENAC SODIUM 1 % TD GEL: 4.0000 g | Freq: Four times a day (QID) | TRANSDERMAL | 0 refills | Status: DC

## 2017-05-12 MED ORDER — FENTANYL 25 MCG/HR TD PT72: 25.0000 ug | MEDICATED_PATCH | TRANSDERMAL | 0 refills | Status: DC

## 2017-05-12 MED ORDER — LORATADINE 10 MG PO TABS: 10.0000 mg | ORAL_TABLET | Freq: Every day | ORAL | Status: DC | PRN

## 2017-05-12 MED ORDER — TRAMADOL HCL 50 MG PO TABS: 100.0000 mg | ORAL_TABLET | Freq: Two times a day (BID) | ORAL | 0 refills | Status: DC | PRN

## 2017-05-12 MED ORDER — METOPROLOL TARTRATE 100 MG PO TABS: 100.0000 mg | ORAL_TABLET | Freq: Two times a day (BID) | ORAL | 0 refills | Status: DC

## 2017-05-12 MED ORDER — DOXEPIN HCL 50 MG PO CAPS: 50.0000 mg | ORAL_CAPSULE | Freq: Every day | ORAL | Status: DC

## 2017-05-12 MED ORDER — PANTOPRAZOLE SODIUM 40 MG PO TBEC: 40.0000 mg | DELAYED_RELEASE_TABLET | Freq: Two times a day (BID) | ORAL | 0 refills | Status: DC

## 2017-05-12 MED ORDER — AMLODIPINE BESYLATE 10 MG PO TABS: 10.0000 mg | ORAL_TABLET | Freq: Every day | ORAL | 0 refills | Status: DC

## 2017-05-12 MED ORDER — POTASSIUM CHLORIDE CRYS ER 20 MEQ PO TBCR: 20.0000 meq | EXTENDED_RELEASE_TABLET | Freq: Every day | ORAL | 0 refills | Status: DC

## 2017-05-12 MED ORDER — GABAPENTIN 300 MG PO CAPS: 300.0000 mg | ORAL_CAPSULE | Freq: Every day | ORAL | 0 refills | Status: DC

## 2017-05-12 NOTE — Progress Notes (Signed)
Pt had DC summary reviewed by PA P. Love. No questions or concerns noted. Pt belongings packed up and taken down with pt to main lobby. Friend came to DC pt.

## 2017-05-12 NOTE — Progress Notes (Signed)
Subjective/Complaints: Patient without issues overnight.  Excited to be going home today.  ROS: Patient denies fever, rash, sore throat, blurred vision, nausea, vomiting, diarrhea, cough, shortness of breath or chest pain, joint or back pain, headache, or mood change.     Objective: Vital Signs: Blood pressure 116/86, pulse 94, temperature 98.7 F (37.1 C), temperature source Oral, resp. rate (!) 21, height 5' (1.524 m), weight 109.1 kg (240 lb 8.4 oz), SpO2 98 %. No results found. Results for orders placed or performed during the hospital encounter of 04/29/17 (from the past 72 hour(s))  CBC with Differential/Platelet     Status: Abnormal   Collection Time: 05/10/17  6:37 AM  Result Value Ref Range   WBC 7.4 4.0 - 10.5 K/uL   RBC 3.66 (L) 3.87 - 5.11 MIL/uL   Hemoglobin 11.0 (L) 12.0 - 15.0 g/dL   HCT 34.2 (L) 36.0 - 46.0 %   MCV 93.4 78.0 - 100.0 fL   MCH 30.1 26.0 - 34.0 pg   MCHC 32.2 30.0 - 36.0 g/dL   RDW 15.0 11.5 - 15.5 %   Platelets 336 150 - 400 K/uL   Neutrophils Relative % 57 %   Neutro Abs 4.2 1.7 - 7.7 K/uL   Lymphocytes Relative 38 %   Lymphs Abs 2.8 0.7 - 4.0 K/uL   Monocytes Relative 3 %   Monocytes Absolute 0.2 0.1 - 1.0 K/uL   Eosinophils Relative 2 %   Eosinophils Absolute 0.1 0.0 - 0.7 K/uL   Basophils Relative 0 %   Basophils Absolute 0.0 0.0 - 0.1 K/uL    Comment: Performed at [facility], [address] [place], [place] [address]     Constitutional: No distress . Vital signs reviewed. HEENT: EOMI, oral membranes moist Cardiovascular: RRR without murmur. No JVD    Respiratory: CTA Bilaterally without wheezes or rales. Normal effort    GI: BS +, non-tender, non-distended  Skin:   Intact Neuro: oriented.  Abnormal Sensory Reduced sensation dorsum of the left foot as well as little toe.  Motor: remains trace out of 5 right ankle dorsiflexor plantar flexor.  knee extensor and hip flexor are 4/5. 5/5 bilateral upper limbs and 5/5 in left lower  limb.    Musc/Skel: foot less tender to palpation  Psych: cooperative, flat  Assessment/Plan: 1. Functional deficits secondary to rhabdomyolysis, right foot drop which require 3+ hours per day of interdisciplinary therapy in a comprehensive inpatient rehab setting. Physiatrist is providing close team supervision and 24 hour management of active medical problems listed below. Physiatrist and rehab team continue to assess barriers to discharge/monitor patient progress toward functional and medical goals. FIM: Function - Bathing Position: Shower Body parts bathed by patient: Left arm, Chest, Right arm, Buttocks, Right upper leg, Left upper leg, Abdomen, Front perineal area, Right lower leg, Left lower leg Body parts bathed by helper: Back Assist Level: More than reasonable time  Function- Upper Body Dressing/Undressing What is the patient wearing?: Pull over shirt/dress Pull over shirt/dress - Perfomed by patient: Thread/unthread right sleeve, Thread/unthread left sleeve, Put head through opening, Pull shirt over trunk Assist Level: No help, No cues Function - Lower Body Dressing/Undressing What is the patient wearing?: Pants, Socks, Shoes, AFO Position: Sitting EOB Pants- Performed by patient: Pull pants up/down, Thread/unthread right pants leg, Thread/unthread left pants leg Pants- Performed by helper: Thread/unthread right pants leg, Thread/unthread left pants leg Non-skid slipper socks- Performed by patient: Don/doff right sock, Don/doff left sock Non-skid slipper socks-  Performed by helper: Don/doff right sock Socks - Performed by patient: Don/doff right sock, Don/doff left sock Shoes - Performed by patient: Don/doff right shoe, Don/doff left shoe, Fasten right, Fasten left Shoes - Performed by helper: Don/doff right shoe AFO - Performed by patient: Don/doff right AFO AFO - Performed by helper: Don/doff right AFO TED Hose - Performed by patient: Don/doff left TED hose TED Hose -  Performed by helper: Don/doff right TED hose Assist for footwear: Setup Assist for lower body dressing: More than reasonable time Assistive Device Comment: reacher + sock aide  Function - Toileting Toileting activity did not occur: No continent bowel/bladder event Toileting steps completed by patient: Adjust clothing prior to toileting, Performs perineal hygiene, Adjust clothing after toileting Toileting steps completed by helper: Adjust clothing after toileting Toileting Assistive Devices: Grab bar or rail Assist level: More than reasonable time  Function - Air cabin crew transfer assistive device: Walker, Grab bar, Elevated toilet seat/BSC over toilet Assist level to toilet: No Help, no cues, assistive device, takes more than a reasonable amount of time Assist level from toilet: No Help, no cues, assistive device, takes more than a reasonable amount of time  Function - Chair/bed transfer Chair/bed transfer method: Stand pivot Chair/bed transfer assist level: No Help, no cues, assistive device, takes more than a reasonable amount of time Chair/bed transfer assistive device: Armrests, Walker Chair/bed transfer details: Tactile cues for initiation, Tactile cues for sequencing, Verbal cues for technique, Verbal cues for safe use of DME/AE, Manual facilitation for weight shifting  Function - Locomotion: Wheelchair Will patient use wheelchair at discharge?: Yes Type: Manual Max wheelchair distance: 150' Assist Level: No help, No cues, assistive device, takes more than reasonable amount of time Assist Level: No help, No cues, assistive device, takes more than reasonable amount of time Assist Level: No help, No cues, assistive device, takes more than reasonable amount of time Turns around,maneuvers to table,bed, and toilet,negotiates 3% grade,maneuvers on rugs and over doorsills: Yes Function - Locomotion: Ambulation Assistive device: Walker-rolling Max distance: 60 Assist level:  No help, No cues, assistive device, takes more than a reasonable amount of time Assist level: No help, No cues, assistive device, takes more than a reasonable amount of time Walk 50 feet with 2 turns activity did not occur: Safety/medical concerns Assist level: No help, No cues, assistive device, takes more than a reasonable amount of time Walk 150 feet activity did not occur: Safety/medical concerns Walk 10 feet on uneven surfaces activity did not occur: Safety/medical concerns Assist level: Supervision or verbal cues  Function - Comprehension Comprehension: Auditory Comprehension assist level: Follows basic conversation/direction with extra time/assistive device  Function - Expression Expression: Verbal Expression assist level: Expresses basic needs/ideas: With extra time/assistive device  Function - Social Interaction Social Interaction assist level: Interacts appropriately 90% of the time - Needs monitoring or encouragement for participation or interaction.  Function - Problem Solving Problem solving assist level: Solves basic 90% of the time/requires cueing < 10% of the time  Function - Memory Memory assist level: Recognizes or recalls 75 - 89% of the time/requires cueing 10 - 24% of the time Patient normally able to recall (first 3 days only): Current season, Location of own room, Staff names and faces, That he or she is in a hospital  Medical Problem List and Plan: 1.Functional and mobility deficitssecondary to rhabdomyolysis and encephalopathy. Pt with right foot drop and dysesthesias----?sciatic nerve injury due to prolonged down time?  . -Discharge home today -Patient to  see Rehab MD/provider in the office for transitional care encounter in 1-2 weeks.,  2. DVT Prophylaxis/Anticoagulation: Pharmaceutical:Heparin 3. Pain Management:Fentanyl patch with tramadol prn -Continue gabapentin at 300mg  qhs for neuropathic pain 4. Mood:LCSW  to follow for evaluation and support. 5. Neuropsych: This patientiscapable of making decisions on herown behalf. 6. Skin/Wound Care:Pressure relief measures. 7. Fluids/Electrolytes/Nutrition:Strict I/O with daily weights.     -Continue to replace potassium  -Diet liberalized with fluid restriction 8. Rhabdomyolysis with acute renal failure: Continue to monitor UOP as well as labs for recovery. SCr trending downwards 11.3-->4.32 > 3.51> 2.9 > 2.68 > 1.91 4/8     -Can recheck as an outpatient but would expect continued resolution 9. Leucocytosis: White blood cell count up to down to 8.3 4/5   -Urinalysis is positive    -UCX negative  10. Anemia: Will monitor with serial checks. H/H up to 11k today.  11. Abnormal LFTs: Normalizing 12. Schizoaffective/depressive disorder: Continue Clozaril, Sinequan, Seroquel and Trintellix at bedtime.  13. HTN: much improved control overall 4/11  - Currently on Metoprolol bid.     -On amlodipine 5 mg, increased to 10 mg 4/3  .  Increased metoprolol to 100 mg twice daily on 4/1   Clonidine as needed for elevated blood pressure     Vitals:   05/11/17 2045 05/12/17 0456  BP: (!) 139/91 116/86  Pulse: (!) 108 94  Resp:  (!) 21  Temp:  98.7 F (37.1 C)  SpO2:  98%    - 14.  Hypokalemia, increased potassium to 20 mEq daily--continue   LOS (Days) 13 A FACE TO FACE EVALUATION WAS PERFORMED  Meredith Staggers 05/12/2017, 8:48 AM

## 2017-05-12 NOTE — Discharge Instructions (Signed)
Inpatient Rehab Discharge Instructions  [facility] Discharge date and time:    Activities/Precautions/ Functional Status: Activity: no lifting, driving, or strenuous exercise for till cleared by MD Diet: regular diet Wound Care:   Functional status:  ___ No restrictions     ___ Walk up steps independently ___ 24/7 supervision/assistance   ___ Walk up steps with assistance ___ Intermittent supervision/assistance  ___ Bathe/dress independently ___ Walk with walker     ___ Bathe/dress with assistance ___ Walk Independently    ___ Shower independently ___ Walk with assistance    ___ Shower with assistance ___ No alcohol     ___ Return to work/school ________  COMMUNITY REFERRALS UPON DISCHARGE:   Home Health:  Do you home exercise program given to you by your therapists.  Your Medicaid will not cover for you to have therapies at home. Medical Equipment/Items Ordered:  Tub transfer bench; rolling walker; 18"x18" wheelchair with cushion  Agency/Supplier:  [employer]        Phone:  [phone] Other:  Request submitted to Medicaid for [place] on 05-11-17.  Contact your Medicaid worker to get signed up for transportation services.  Special Instructions: 1. Do not use ibuprofen, aleve, motrin or other NSAIDs   My questions have been answered and I understand these instructions. I will adhere to these goals and the provided educational materials after my discharge from the hospital.  Patient/Caregiver Signature _______________________________ Date __________  Clinician Signature _______________________________________ Date __________  Please bring this form and your medication list with you to all your follow-up doctor's appointments.

## 2017-05-12 NOTE — Telephone Encounter (Signed)
Prior auth initiated for Fentanyl patch 25 mcg 5 day supply #3 with [place] [employer].  Confirmation #: [id number]

## 2017-05-12 NOTE — Discharge Summary (Signed)
Physician Discharge Summary  Patient ID: Jenna Hill MRN: [id number] DOB/AGE: 47/05/1970 47 y.o.  Admit date: 04/29/2017 Discharge date: 05/12/2017  Discharge Diagnoses:  Principal Problem:   Rhabdomyolysis Active Problems:   Schizoaffective disorder, depressive type ([place])   Acute encephalopathy   Injury of right sciatic nerve   Discharged Condition: stable   Significant Diagnostic Studies: N/A   Labs:  Basic Metabolic Panel: BMP Latest Ref Rng & Units 05/09/2017 05/06/2017 05/04/2017  Glucose 65 - 99 mg/dL 104(H) 93 96  BUN 6 - 20 mg/dL 16 25(H) 26(H)  Creatinine 0.44 - 1.00 mg/dL 1.91(H) 2.68(H) 2.90(H)  Sodium 135 - 145 mmol/L 139 140 139  Potassium 3.5 - 5.1 mmol/L 3.8 3.4(L) 3.4(L)  Chloride 101 - 111 mmol/L 104 102 104  CO2 22 - 32 mmol/L 22 24 25   Calcium 8.9 - 10.3 mg/dL 8.9 8.7(L) 8.8(L)    CBC: CBC Latest Ref Rng & Units 05/10/2017 05/06/2017 05/03/2017  WBC 4.0 - 10.5 K/uL 7.4 8.3 11.3(H)  Hemoglobin 12.0 - 15.0 g/dL 11.0(L) 10.1(L) 10.0(L)  Hematocrit 36.0 - 46.0 % 34.2(L) 32.1(L) 31.4(L)  Platelets 150 - 400 K/uL 336 352 307    CBG: No results for input(s): GLUCAP in the last 168 hours.  Brief HPI:   Jenna Hill is a 47 y.o. female with history of bipolar disorder, schizophrenia, HTN , history of overdose/sucide attempts who was admitted after being found down on 04/05/17. Patient was obtunded, with dried vomitus on cheeks, hypothermic, tachycardic and had AKI. UDS negative and encephalopathy felt to be due to suicide attempt. She was intubated for airway protection and started on  IV unasyn for aspiration PNA and IVF for  AKI with rhabdomyolysis. She required HD briefly due to worsening of renal status with volume overload with oliguria.  She tolerated extubation on 3/8 with intermittent HD.  Urine output is improving and HD has been on hold since 3/20. SCr peaked at 11.3 and has started to trend downwards in the past few days. Anemia has been treated with  transfusion, ferrlecit as well as aranesp.  Psychiatry consulted for input and felt that AMS likely due to polypharmacy and not SI with recommendations to titrated Clozaril to home dose.    She has had issues with nausea and CT abdomen/pelvis done revealing acute pancreatitis with diffuse subcutaneous and abdominal wall stranding and edema as well as edema of visualized upper thigh--no drainable fluid collection noted. As patient without abdominal pain and Lipase negative symptoms not felt to be due to pancreatitis. Abnormal LFTs felt to be due to shocked liver and rhabdomyolysis.  She continued to report numbness with dysesthesias right hemibody and has had 2-3 + edema RLE with weakness and foot drop.  Dr. Doreatha Martin was consulted due to concerns of compartment syndrome and felt that weakness and pain due to muscle damage from rhabdomyolysis. To use CAM boot/PRAFO to prevent foot drop and EMG/NCS in the future. Medical issues improving but BP remains labile. Patient noted to be limited by RLE weakness as well as debility due to multiple medical issues. CIR recommended due to functional deficits.    Hospital Course: [name] was admitted to rehab 04/29/2017 for inpatient therapies to consist of PT, ST and OT at least three hours five days a week. Past admission physiatrist, therapy team and rehab RN have worked together to provide customized collaborative inpatient rehab.  Malignant BP noted at admission and medications have been titrated upwards for better control. Heart rate and BP is controlled at  discharge and she has been educated on importance of compliance. Renal status was monitored closely and is showing steady improvement therefore diet was liberalized to regular with d/c of fluid restrictions prior to discharge. IJ catheter was removed on 4/2 and K dur was added due to hypokalemia.  BUN/Serum has improved from 46/7.59 to 16/1.91 and hypokalemia has resolved.     Gabapentin was added to help with  neuropathic symptoms and titrated upwards. Pain RLE has been managed with use of fentanyl 25 mcg as well as tramadol on prn basis. Serial CBC showed that leucocytosis has resolved and ABLA is improving. No signs of infection noted. Abnormal LFTs are resolving and intake is good without GI symptoms. Mood has been stable on current medications and psychology evaluation showed patient to be well managed without reports of worsening or exacerbation of underlying symptoms. She has had improvement in RLE strength and has progressed to modified independent level. She does not qualify for home therapy due to current diagnosis but been educated on HEP as well as importance of activity. She has been referred to [facility] services by LCSW.    Rehab course: During patient's stay in rehab weekly team conferences were held to monitor patient's progress, set goals and discuss barriers to discharge. At admission, patient required min assist with basic self care tasks and mobility.  She  has had improvement in activity tolerance, balance, postural control as well as ability to compensate for deficits. She has had improvement in functional use RLE as well as improvement in awareness.  She requires more than reasonable amount of time to complete all ADL tasks at modified independent level. She is able to perform simple kitchen tasks and meal prep at modified independent level. She is modified independent for transfers and is able to ambulate  10' with RW and increased time. She requires min guard assist with 2 rails to climb stairs.  No family was available for education but reports that friends will check in after discharge.      Discharge disposition: 01-Home or Self Care  Diet: Regular.   Special Instructions:   Allergies as of 05/12/2017      Reactions   Bee Venom Anaphylaxis   Codeine Swelling   Facial swelling   Other Anaphylaxis   bertrillium or bertillium     Toradol [ketorolac Tromethamine] Swelling   Tongue  swelling   Lortab [hydrocodone-acetaminophen] Hives   Percocet [oxycodone-acetaminophen] Hives   Ibuprofen    Raised hives Also with naproxen   Latex Rash      Medication List    STOP taking these medications   ABILIFY MAINTENA 400 MG Prsy prefilled syringe Generic drug:  ARIPiprazole ER   calcium acetate 667 MG capsule Commonly known as:  PHOSLO   EPINEPHrine 0.3 mg/0.3 mL Soaj injection Commonly known as:  EPI-PEN     TAKE these medications   acetaminophen 325 MG tablet Commonly known as:  TYLENOL Take 2 tablets (650 mg total) by mouth every 6 (six) hours as needed for moderate pain or headache.   amLODipine 10 MG tablet Commonly known as:  NORVASC Take 1 tablet (10 mg total) by mouth daily with supper.   clozapine 200 MG tablet Commonly known as:  CLOZARIL Take 400 mg by mouth at bedtime.   diclofenac sodium 1 % Gel Commonly known as:  VOLTAREN Apply 4 g topically 4 (four) times daily.   doxepin 50 MG capsule Commonly known as:  SINEQUAN Take 1 capsule (50 mg total) by  mouth at bedtime. What changed:  how much to take   fentaNYL 25 MCG/HR patch--Rx # 3 patches Commonly known as:  DURAGESIC - dosed mcg/hr Place 1 patch (25 mcg total) onto the skin every 3 (three) days.   gabapentin 300 MG capsule Commonly known as:  NEURONTIN Take 1 capsule (300 mg total) by mouth at bedtime.   loratadine 10 MG tablet Commonly known as:  ALLERGY RELIEF Take 1 tablet (10 mg total) by mouth daily as needed for allergies. What changed:    when to take this  reasons to take this   metoprolol tartrate 100 MG tablet Commonly known as:  LOPRESSOR Take 1 tablet (100 mg total) by mouth 2 (two) times daily. What changed:    medication strength  how much to take   pantoprazole 40 MG tablet Commonly known as:  PROTONIX Take 1 tablet (40 mg total) by mouth 2 (two) times daily before a meal.   potassium chloride SA 20 MEQ tablet Commonly known as:  K-DUR,KLOR-CON Take 1  tablet (20 mEq total) by mouth daily. Start taking on:  05/13/2017   QUEtiapine 400 MG 24 hr tablet Commonly known as:  SEROQUEL XR Take 400 mg by mouth at bedtime.   traMADol 50 MG tablet--Rx # 30 pill s Commonly known as:  ULTRAM Take 2 tablets (100 mg total) by mouth every 12 (twelve) hours as needed for severe pain (breakthrough).   TRINTELLIX 20 MG Tabs tablet Generic drug:  vortioxetine HBr Take 20 mg by mouth daily. With food      Follow-up Information    Meredith Staggers, MD Follow up.   Specialty:  Physical Medicine and Rehabilitation Why:  Office will call you with follow up appointment Contact information: [place] [address] [phone]        Alfonse Spruce, FNP Follow up.   Specialty:  Family Medicine Why:  Sonia Baller will call Ms. Hairston's office on Monday to get you a post hospital follow up visit and will then call you with appointment date and time. Contact information: [place] [address] [phone]           Signed: Bary Leriche 05/12/2017, 3:33 PM

## 2017-05-13 NOTE — Telephone Encounter (Addendum)
Fentanyl Patches 25 mcg #3 approved by [place] [employer]  Confirmation #:[id number]   Prior Approval [id number]   05/12/17-06/11/17  (one month only) as it was still considered acute pain

## 2017-05-14 NOTE — Progress Notes (Signed)
Social Work Discharge Note  The overall goal for the admission was met for:   Discharge location: Yes - home  Length of Stay: Yes - 13 days  Discharge activity level: Yes - modified independent  Home/community participation: Yes  Services provided included: MD, RD, PT, OT, RN, Pharmacy, Neuropsych and SW  Financial Services: Medicaid  Follow-up services arranged: DME: 18"x18" wheelchair with basic cushion, rolling walker, and tub transfer bench from [facility] and Patient/Family has no preference for HH/DME agencies  Comments (or additional information): Pt returning to her apt and her friends will assist intermittently.  Mother assists from a distance (in [place]).  Pt has good mental health support and CSW has requested PCS for pt.  Also encouraged pt to call Medicaid worker for transportation request.  Patient/Family verbalized understanding of follow-up arrangements: Yes  Individual responsible for coordination of the follow-up plan: pt with support from her friends and family  Confirmed correct DME delivered: Trey Sailors 05/14/2017    Mischa Pollard, Silvestre Mesi

## 2017-05-16 ENCOUNTER — Telehealth: Payer: Self-pay | Admitting: *Deleted

## 2017-05-16 NOTE — Telephone Encounter (Signed)
1st attempt to reach Jenna Hill to do transtional care call but she is unavailable. We will call back in the am to reach her.

## 2017-05-18 ENCOUNTER — Encounter: Payer: Medicaid Other | Admitting: Physical Medicine & Rehabilitation

## 2017-05-18 NOTE — Telephone Encounter (Signed)
Transitional Care call-I spoke with Jenna Hill     1. Are you/is patient experiencing any problems since coming home? Are there any questions regarding any aspect of care?NO 2. Are there any questions regarding medications administration/dosing? Are meds being taken as prescribed? Patient should review meds with caller to confirm  YES 3. Have there been any falls? NO 4. Has Home Health been to the house and/or have they contacted you? If not, have you tried to contact them? Can we help you contact them? The PCS came out to set up aide, she is not scheduled for HH/Rehab 5. Are bowels and bladder emptying properly? Are there any unexpected incontinence issues? If applicable, is patient following bowel/bladder programs? NO PROBLEMS 6. Any fevers, problems with breathing, unexpected pain? NO 7. Are there any skin problems or new areas of breakdown? NO 8. Has the patient/family member arranged specialty MD follow up (ie cardiology/neurology/renal/surgical/etc)?  Can we help arrange? She missed original appt today with Dr Naaman Plummer, rescheduled to next Wednesday, reviewed appt with PCP as well. 9. Does the patient need any other services or support that we can help arrange? NO 10. Are caregivers following through as expected in assisting the patient? YES 11. Has the patient quit smoking, drinking alcohol, or using drugs as recommended? N/A  Appointment Wednesday 05/25/17 @11 :40 arrive by 11:15 to see Dr Naaman Plummer Address reviewed [address]

## 2017-05-20 ENCOUNTER — Inpatient Hospital Stay (HOSPITAL_COMMUNITY)
Admission: EM | Admit: 2017-05-20 | Discharge: 2017-05-24 | DRG: 917 | Disposition: A | Discharge status: Other Acute Inpatient Hospital | Payer: Medicaid Other | Attending: Internal Medicine | Admitting: Internal Medicine

## 2017-05-20 ENCOUNTER — Encounter (HOSPITAL_COMMUNITY): Payer: Self-pay | Admitting: *Deleted

## 2017-05-20 ENCOUNTER — Telehealth (HOSPITAL_COMMUNITY): Payer: Self-pay | Admitting: *Deleted

## 2017-05-20 DIAGNOSIS — Z91040 Latex allergy status: Secondary | ICD-10-CM

## 2017-05-20 DIAGNOSIS — Z9181 History of falling: Secondary | ICD-10-CM

## 2017-05-20 DIAGNOSIS — I110 Hypertensive heart disease with heart failure: Secondary | ICD-10-CM | POA: Diagnosis present

## 2017-05-20 DIAGNOSIS — T50904A Poisoning by unspecified drugs, medicaments and biological substances, undetermined, initial encounter: Secondary | ICD-10-CM | POA: Diagnosis not present

## 2017-05-20 DIAGNOSIS — I10 Essential (primary) hypertension: Secondary | ICD-10-CM

## 2017-05-20 DIAGNOSIS — F251 Schizoaffective disorder, depressive type: Secondary | ICD-10-CM | POA: Diagnosis not present

## 2017-05-20 DIAGNOSIS — G9340 Encephalopathy, unspecified: Secondary | ICD-10-CM | POA: Diagnosis not present

## 2017-05-20 DIAGNOSIS — I5032 Chronic diastolic (congestive) heart failure: Secondary | ICD-10-CM | POA: Diagnosis not present

## 2017-05-20 DIAGNOSIS — F319 Bipolar disorder, unspecified: Secondary | ICD-10-CM | POA: Diagnosis present

## 2017-05-20 DIAGNOSIS — G4733 Obstructive sleep apnea (adult) (pediatric): Secondary | ICD-10-CM | POA: Diagnosis present

## 2017-05-20 DIAGNOSIS — R000 Tachycardia, unspecified: Secondary | ICD-10-CM | POA: Diagnosis present

## 2017-05-20 DIAGNOSIS — Z79899 Other long term (current) drug therapy: Secondary | ICD-10-CM

## 2017-05-20 DIAGNOSIS — R401 Stupor: Secondary | ICD-10-CM

## 2017-05-20 DIAGNOSIS — Z62810 Personal history of physical and sexual abuse in childhood: Secondary | ICD-10-CM | POA: Diagnosis present

## 2017-05-20 DIAGNOSIS — E876 Hypokalemia: Secondary | ICD-10-CM | POA: Diagnosis present

## 2017-05-20 DIAGNOSIS — Z79891 Long term (current) use of opiate analgesic: Secondary | ICD-10-CM

## 2017-05-20 DIAGNOSIS — M25473 Effusion, unspecified ankle: Secondary | ICD-10-CM

## 2017-05-20 DIAGNOSIS — E860 Dehydration: Secondary | ICD-10-CM | POA: Diagnosis present

## 2017-05-20 DIAGNOSIS — Z885 Allergy status to narcotic agent status: Secondary | ICD-10-CM

## 2017-05-20 DIAGNOSIS — G92 Toxic encephalopathy: Secondary | ICD-10-CM | POA: Diagnosis present

## 2017-05-20 DIAGNOSIS — Z818 Family history of other mental and behavioral disorders: Secondary | ICD-10-CM

## 2017-05-20 DIAGNOSIS — M79604 Pain in right leg: Secondary | ICD-10-CM

## 2017-05-20 DIAGNOSIS — T50901A Poisoning by unspecified drugs, medicaments and biological substances, accidental (unintentional), initial encounter: Principal | ICD-10-CM | POA: Diagnosis present

## 2017-05-20 DIAGNOSIS — Z915 Personal history of self-harm: Secondary | ICD-10-CM

## 2017-05-20 DIAGNOSIS — F250 Schizoaffective disorder, bipolar type: Secondary | ICD-10-CM | POA: Diagnosis present

## 2017-05-20 DIAGNOSIS — Z888 Allergy status to other drugs, medicaments and biological substances status: Secondary | ICD-10-CM

## 2017-05-20 DIAGNOSIS — M21371 Foot drop, right foot: Secondary | ICD-10-CM | POA: Diagnosis present

## 2017-05-20 DIAGNOSIS — Z91030 Bee allergy status: Secondary | ICD-10-CM

## 2017-05-20 LAB — URINALYSIS, ROUTINE W REFLEX MICROSCOPIC
Bilirubin Urine: NEGATIVE
Glucose, UA: NEGATIVE mg/dL
Ketones, ur: NEGATIVE mg/dL
Nitrite: NEGATIVE
PH: 5 (ref 5.0–8.0)
PROTEIN: 30 mg/dL — AB
Specific Gravity, Urine: 1.019 (ref 1.005–1.030)

## 2017-05-20 LAB — T4, FREE: Free T4: 0.9 ng/dL (ref 0.61–1.12)

## 2017-05-20 LAB — CBC WITH DIFFERENTIAL/PLATELET
Basophils Absolute: 0 10*3/uL (ref 0.0–0.1)
Basophils Relative: 0 %
Eosinophils Absolute: 0.1 10*3/uL (ref 0.0–0.7)
Eosinophils Relative: 0 %
HEMATOCRIT: 38.1 % (ref 36.0–46.0)
HEMOGLOBIN: 12.4 g/dL (ref 12.0–15.0)
LYMPHS PCT: 18 %
Lymphs Abs: 2.6 10*3/uL (ref 0.7–4.0)
MCH: 29.5 pg (ref 26.0–34.0)
MCHC: 32.5 g/dL (ref 30.0–36.0)
MCV: 90.7 fL (ref 78.0–100.0)
MONOS PCT: 6 %
Monocytes Absolute: 0.8 10*3/uL (ref 0.1–1.0)
NEUTROS ABS: 10.7 10*3/uL — AB (ref 1.7–7.7)
NEUTROS PCT: 76 %
Platelets: 410 10*3/uL — ABNORMAL HIGH (ref 150–400)
RBC: 4.2 MIL/uL (ref 3.87–5.11)
RDW: 13.6 % (ref 11.5–15.5)
WBC: 14.1 10*3/uL — ABNORMAL HIGH (ref 4.0–10.5)

## 2017-05-20 LAB — COMPREHENSIVE METABOLIC PANEL
ALK PHOS: 63 U/L (ref 38–126)
ALT: 25 U/L (ref 14–54)
AST: 25 U/L (ref 15–41)
Albumin: 3.7 g/dL (ref 3.5–5.0)
Anion gap: 14 (ref 5–15)
BUN: 14 mg/dL (ref 6–20)
CALCIUM: 9.4 mg/dL (ref 8.9–10.3)
CO2: 19 mmol/L — ABNORMAL LOW (ref 22–32)
CREATININE: 1.35 mg/dL — AB (ref 0.44–1.00)
Chloride: 106 mmol/L (ref 101–111)
GFR calc Af Amer: 54 mL/min — ABNORMAL LOW (ref >60)
GFR, EST NON AFRICAN AMERICAN: 46 mL/min — AB (ref >60)
Glucose, Bld: 169 mg/dL — ABNORMAL HIGH (ref 65–99)
Potassium: 3.2 mmol/L — ABNORMAL LOW (ref 3.5–5.1)
Sodium: 139 mmol/L (ref 135–145)
Total Bilirubin: 0.6 mg/dL (ref 0.3–1.2)
Total Protein: 7.7 g/dL (ref 6.5–8.1)

## 2017-05-20 LAB — RAPID URINE DRUG SCREEN, HOSP PERFORMED
Amphetamines: NOT DETECTED
BENZODIAZEPINES: POSITIVE — AB
Barbiturates: NOT DETECTED
COCAINE: NOT DETECTED
Opiates: NOT DETECTED
Tetrahydrocannabinol: NOT DETECTED

## 2017-05-20 LAB — ETHANOL

## 2017-05-20 LAB — SALICYLATE LEVEL

## 2017-05-20 LAB — TSH: TSH: 1.012 u[IU]/mL (ref 0.350–4.500)

## 2017-05-20 LAB — MAGNESIUM: Magnesium: 1.3 mg/dL — ABNORMAL LOW (ref 1.7–2.4)

## 2017-05-20 LAB — I-STAT BETA HCG BLOOD, ED (MC, WL, AP ONLY): I-stat hCG, quantitative: <5 m[IU]/mL (ref <5)

## 2017-05-20 LAB — ACETAMINOPHEN LEVEL: Acetaminophen (Tylenol), Serum: <10 ug/mL — ABNORMAL LOW (ref 10–30)

## 2017-05-20 LAB — CBG MONITORING, ED: Glucose-Capillary: 132 mg/dL — ABNORMAL HIGH (ref 65–99)

## 2017-05-20 LAB — CK: Total CK: 253 U/L — ABNORMAL HIGH (ref 38–234)

## 2017-05-20 MED ORDER — MAGNESIUM SULFATE 2 GM/50ML IV SOLN
2.0000 g | INTRAVENOUS | Status: AC
  Administered 2017-05-20: 2 g via INTRAVENOUS
  Filled 2017-05-20: qty 50

## 2017-05-20 MED ORDER — VITAMIN B-1 100 MG PO TABS
100.0000 mg | ORAL_TABLET | Freq: Every day | ORAL | Status: DC
  Administered 2017-05-21 – 2017-05-24 (×4): 100 mg via ORAL
  Filled 2017-05-20 (×4): qty 1

## 2017-05-20 MED ORDER — PREGABALIN 50 MG PO CAPS
50.0000 mg | ORAL_CAPSULE | Freq: Once | ORAL | Status: AC
  Administered 2017-05-20: 50 mg via ORAL
  Filled 2017-05-20: qty 1

## 2017-05-20 MED ORDER — LORAZEPAM 2 MG/ML IJ SOLN
1.0000 mg | Freq: Once | INTRAMUSCULAR | Status: AC
  Administered 2017-05-20: 1 mg via INTRAVENOUS
  Filled 2017-05-20: qty 1

## 2017-05-20 MED ORDER — LORAZEPAM 2 MG/ML IJ SOLN: 0.5000 mg | Freq: Four times a day (QID) | INTRAMUSCULAR | Status: DC | PRN

## 2017-05-20 MED ORDER — ENOXAPARIN SODIUM 40 MG/0.4ML ~~LOC~~ SOLN
40.0000 mg | Freq: Every morning | SUBCUTANEOUS | Status: DC
  Administered 2017-05-21 – 2017-05-24 (×4): 40 mg via SUBCUTANEOUS
  Filled 2017-05-20 (×3): qty 0.4

## 2017-05-20 MED ORDER — POTASSIUM CHLORIDE IN NACL 20-0.9 MEQ/L-% IV SOLN
Freq: Once | INTRAVENOUS | Status: AC
  Administered 2017-05-20: 13:00:00 via INTRAVENOUS
  Filled 2017-05-20: qty 1000

## 2017-05-20 MED ORDER — FOLIC ACID 1 MG PO TABS
1.0000 mg | ORAL_TABLET | Freq: Every day | ORAL | Status: DC
  Administered 2017-05-21 – 2017-05-24 (×4): 1 mg via ORAL
  Filled 2017-05-20 (×4): qty 1

## 2017-05-20 MED ORDER — PANTOPRAZOLE SODIUM 40 MG PO TBEC
40.0000 mg | DELAYED_RELEASE_TABLET | Freq: Two times a day (BID) | ORAL | Status: DC
  Administered 2017-05-21 – 2017-05-24 (×8): 40 mg via ORAL
  Filled 2017-05-20 (×8): qty 1

## 2017-05-20 MED ORDER — ONDANSETRON HCL 4 MG/2ML IJ SOLN
4.0000 mg | Freq: Four times a day (QID) | INTRAMUSCULAR | Status: DC | PRN
  Administered 2017-05-24: 4 mg via INTRAVENOUS
  Filled 2017-05-20: qty 2

## 2017-05-20 MED ORDER — TRAMADOL HCL 50 MG PO TABS
50.0000 mg | ORAL_TABLET | Freq: Four times a day (QID) | ORAL | Status: DC | PRN
  Administered 2017-05-20 – 2017-05-24 (×9): 50 mg via ORAL
  Filled 2017-05-20 (×9): qty 1

## 2017-05-20 MED ORDER — METOPROLOL TARTRATE 50 MG PO TABS
100.0000 mg | ORAL_TABLET | Freq: Two times a day (BID) | ORAL | Status: DC
  Administered 2017-05-20 – 2017-05-21 (×2): 100 mg via ORAL
  Filled 2017-05-20: qty 4
  Filled 2017-05-20: qty 2

## 2017-05-20 MED ORDER — SODIUM CHLORIDE 0.9 % IV SOLN
INTRAVENOUS | Status: AC
  Administered 2017-05-20: via INTRAVENOUS

## 2017-05-20 MED ORDER — ONDANSETRON HCL 4 MG PO TABS: 4.0000 mg | ORAL_TABLET | Freq: Four times a day (QID) | ORAL | Status: DC | PRN

## 2017-05-20 MED ORDER — SODIUM CHLORIDE 0.9 % IV BOLUS
1000.0000 mL | Freq: Once | INTRAVENOUS | Status: AC
  Administered 2017-05-20: 1000 mL via INTRAVENOUS

## 2017-05-20 MED ORDER — PREGABALIN 50 MG PO CAPS: 50.0000 mg | ORAL_CAPSULE | Freq: Three times a day (TID) | ORAL | 0 refills | Status: DC

## 2017-05-20 NOTE — ED Triage Notes (Signed)
Patient was BIB EMS from home. Patient was found by roommate sonorous and responsive to pain only. Patient has history of overdosing and requiring intubation back in March 2019. Patient roommate unsure what patient overdosed on. Patient tachypnic with apnea in triage, and tachycardic. Patient 100% on RA at this time. The medications found at the scene by EMS were: Abilify  Amiodarone Clonazipam Doxepin Metoprolol Clonazapine Eszopiclone Protonix Potassium Seroquel Unsure which medications were taken by the patient. Suspect Seroquel, but unsure dosing.

## 2017-05-20 NOTE — ED Notes (Signed)
Poison control called and spoke with this Probation officer regarding patient status. After updating poison control on patient's labs and repeat EKG, poison control encourages serial EKG's and admission. EDMD Knapp notified

## 2017-05-20 NOTE — Progress Notes (Signed)
CSW received phone call from pt's family friend, Langley Gauss Copes, that she had found pt on the floor this morning crawling toward the door in the same condition she found her in before her previous admission.  She had seen pt last night and observed her take medications, but she's concerned that pt took additional medications again.  She does not feel pt can manage her own medications at home and fears this will be an ongoing issue for pt, as it has happened twice now.  She told this CSW that she feels pt needs to go to a facility where her medications can be managed for her.  She requested that medical doctor and psychiatrist work together to find most appropriate setting for pt's well being.   Pt was taken to [facility] ED.  CSW told Langley Gauss that [facility] would call [facility] ED CSW and alert her to the concerns, as they assess and follow pt.  CSW talked with Lonn Georgia, CSW to tell her of the above and she will f/u with pt once appropriate.  Langley Gauss was planning to call pt's mother after phone call to alert her to pt's situation, as well.  Pt has good community support from psychiatry and counselor in the community - Ms. Noemi Chapel, APMHNP-BC and Cherie Ouch, LCSW, both [facility].

## 2017-05-20 NOTE — ED Provider Notes (Addendum)
[facility] DEPT Provider Note   CSN: [id number] Arrival date & time: 05/20/17  0907     History   Chief Complaint Chief Complaint  Patient presents with  . Drug Overdose    HPI Jenna Hill is a 47 y.o. female.  HPI  Patient presents via EMS due to concern of possible overdose. Reportedly the patient lives with a roommate, and roommate called EMS due to the patient's hypersomnolence, minimal arousability. The patient herself is snoring loudly, hypersomnolent, responds to painful stimuli, but does not provide any details of her history. Level 5 caveat secondary to acuity of condition, mental status changes.  EMS notes the patient has multiple medical issues including psychiatric disease, and reportedly has had episodes of taking her roommates medication, when her roommate is not present. It is unclear what the patient may have taken today, though medications include benzodiazepine, antipsychotic medication, pain medication all present in the house.  EMS reports patient was tachycardic, but normotensive in route. Past Medical History:  Diagnosis Date  . Bipolar 1 disorder ([place])   . Difficult intubation   . Hypertension   . PONV (postoperative nausea and vomiting)   . Schizophrenia ([place])   . Spinal headache     Patient Active Problem List   Diagnosis Date Noted  . 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 AKI (acute kidney injury) ([place]) 04/25/2017  . Shock liver 04/10/2017  . Rhabdomyolysis 04/10/2017  . Bipolar 1 disorder ([place]) 04/10/2017  . Acute respiratory failure ([place])   . Acute encephalopathy 04/05/2017  . Pituitary microadenoma ([place]) 04/25/2015  . OSA (obstructive sleep apnea) 04/16/2015  . Obesity 04/16/2015  . Schizoaffective disorder, depressive type ([place]) 04/15/2015  . Hypertension 10/05/2012    Past Surgical History:  Procedure Laterality Date  . ANKLE ARTHROSCOPY Right   . CESAREAN SECTION    . HAND SURGERY Right    . ORIF ANKLE FRACTURE Left 01/29/2016   Procedure: OPEN REDUCTION INTERNAL FIXATION LEFT ANKLE BIMALLEOLAR FRACTURE AND SYNDESMOSIS;  Surgeon: Wylene Simmer, MD;  Location: [facility];  Service: Orthopedics;  Laterality: Left;     OB History   None      Home Medications    Prior to Admission medications   Medication Sig Start Date End Date Taking? Authorizing Provider  acetaminophen (TYLENOL) 325 MG tablet Take 2 tablets (650 mg total) by mouth every 6 (six) hours as needed for moderate pain or headache. 04/29/17   Velvet Bathe, MD  amLODipine (NORVASC) 10 MG tablet Take 1 tablet (10 mg total) by mouth daily with supper. 05/12/17   Love, Ivan Anchors, PA-C  clozapine (CLOZARIL) 200 MG tablet Take 400 mg by mouth at bedtime.     [provider]  diclofenac sodium (VOLTAREN) 1 % GEL Apply 4 g topically 4 (four) times daily. 05/12/17   Love, Ivan Anchors, PA-C  doxepin (SINEQUAN) 50 MG capsule Take 1 capsule (50 mg total) by mouth at bedtime. 05/12/17   Love, Ivan Anchors, PA-C  fentaNYL (DURAGESIC - DOSED MCG/HR) 25 MCG/HR patch Place 1 patch (25 mcg total) onto the skin every 3 (three) days. 05/12/17   Love, Ivan Anchors, PA-C  gabapentin (NEURONTIN) 300 MG capsule Take 1 capsule (300 mg total) by mouth at bedtime. 05/12/17   Love, Ivan Anchors, PA-C  loratadine (ALLERGY RELIEF) 10 MG tablet Take 1 tablet (10 mg total) by mouth daily as needed for allergies. 05/12/17   Love, Ivan Anchors, PA-C  metoprolol tartrate (LOPRESSOR) 100  MG tablet Take 1 tablet (100 mg total) by mouth 2 (two) times daily. 05/12/17   Love, Ivan Anchors, PA-C  pantoprazole (PROTONIX) 40 MG tablet Take 1 tablet (40 mg total) by mouth 2 (two) times daily before a meal. 05/12/17   Love, Ivan Anchors, PA-C  potassium chloride SA (K-DUR,KLOR-CON) 20 MEQ tablet Take 1 tablet (20 mEq total) by mouth daily. 05/13/17   Love, Ivan Anchors, PA-C  QUEtiapine (SEROQUEL XR) 400 MG 24 hr tablet Take 400 mg by mouth at bedtime.     [provider]  traMADol (ULTRAM) 50 MG tablet Take 2 tablets (100 mg total) by mouth every 12 (twelve) hours as needed for severe pain (breakthrough). 05/12/17   Love, Ivan Anchors, PA-C  TRINTELLIX 20 MG TABS tablet Take 20 mg by mouth daily. With food 12/19/15   [provider]    Family History Family History  Problem Relation Age of Onset  . Healthy Mother   . Healthy Father   . Healthy Sister     Social History Social History   Tobacco Use  . Smoking status: Never Smoker  . Smokeless tobacco: Never Used  Substance Use Topics  . Alcohol use: Yes    Comment: Denies  . Drug use: No    Comment: Denies     Allergies   Bee venom; Codeine; Other; Toradol [ketorolac tromethamine]; Lortab [hydrocodone-acetaminophen]; Percocet [oxycodone-acetaminophen]; Ibuprofen; and Latex   Review of Systems Review of Systems  Unable to perform ROS: Mental status change     Physical Exam Updated Vital Signs BP (!) 129/91   Pulse (!) 141   Temp 98.9 F (37.2 C) (Axillary)   Resp (!) 35   SpO2 100%   Physical Exam  Constitutional: She is oriented to person, place, and time. She appears well-developed and well-nourished. No distress.  Patient snoring loudly, responds to painful stimuli, but again is sleeping quickly.  HENT:  Head: Normocephalic and atraumatic.  Eyes: Conjunctivae and EOM are normal.  Cardiovascular: Regular rhythm. Tachycardia present.  Pulmonary/Chest: Effort normal and breath sounds normal. No stridor. No respiratory distress.  Abdominal: She exhibits no distension.  Musculoskeletal: She exhibits no edema.  Neurological: She is alert and oriented to person, place, and time. No cranial nerve deficit.  Does not participate in neurologic exam, but with painful stimuli she does move all tremors spontaneously, gives brief semi-intelligible single word responses in an effort to stop the painful sternal rub, demonstrating persistent capacity for phonation, no obvious facial  asymmetry.  Skin: Skin is warm and dry.  Psychiatric: She has a normal mood and affect. Cognition and memory are impaired.  Nursing note and vitals reviewed.    ED Treatments / Results  Labs (all labs ordered are listed, but only abnormal results are displayed) Labs Reviewed  COMPREHENSIVE METABOLIC PANEL - Abnormal; Notable for the following components:      Result Value   Potassium 3.2 (*)    CO2 19 (*)    Glucose, Bld 169 (*)    Creatinine, Ser 1.35 (*)    GFR calc non Af Amer 46 (*)    GFR calc Af Amer 54 (*)    All other components within normal limits  ACETAMINOPHEN LEVEL - Abnormal; Notable for the following components:   Acetaminophen (Tylenol), Serum <10 (*)    All other components within normal limits  CBC WITH DIFFERENTIAL/PLATELET - Abnormal; Notable for the following components:   WBC 14.1 (*)    Platelets 410 (*)  Neutro Abs 10.7 (*)    All other components within normal limits  CK - Abnormal; Notable for the following components:   Total CK 253 (*)    All other components within normal limits  MAGNESIUM - Abnormal; Notable for the following components:   Magnesium 1.3 (*)    All other components within normal limits  CBG MONITORING, ED - Abnormal; Notable for the following components:   Glucose-Capillary 132 (*)    All other components within normal limits  ETHANOL  SALICYLATE LEVEL  RAPID URINE DRUG SCREEN, HOSP PERFORMED  I-STAT BETA HCG BLOOD, ED ([facility], [facility], [facility] ONLY)    EKG EKG Interpretation  Date/Time:  Friday May 20 2017 09:18:10 EDT Ventricular Rate:  143 PR Interval:    QRS Duration: 59 QT Interval:  299 QTC Calculation: 462 R Axis:   28 Text Interpretation:  Sinus tachycardia Borderline prolonged PR interval LAE, consider biatrial enlargement Low voltage, precordial leads Anteroseptal infarct, old Nonspecific T abnormalities, inferior leads Abnormal ekg Confirmed by Carmin Muskrat [phone]) on 05/20/2017 9:38:19  AM    Procedures Procedures (including critical care time)  Medications Ordered in ED Medications  0.9 % NaCl with KCl 20 mEq/ L  infusion ( Intravenous New Bag/Given 05/20/17 1327)  magnesium sulfate IVPB 2 g 50 mL (0 g Intravenous Stopped 05/20/17 1327)     Initial Impression / Assessment and Plan / ED Course  I have reviewed the triage vital signs and the nursing notes.  Pertinent labs & imaging results that were available during my care of the patient were reviewed by me and considered in my medical decision making (see chart for details).  Waking up slightly. Patient's case has been discussed with poison control. Patient has mildly hypokalemic, and hypomagnesemic. Patient is receiving magnesium repletion, will receive potassium repletion via IV fluids.  1:31 PM Patient much more calm, though she remains sleeping, snoring has improved.  Which remains hemodynamically unremarkable aside from mild tachycardia. Patient's case discussed with poison control again. 3:26 PM Patient now awake, alert, speaking clearly, states that she has typical right leg pain as usual, which does improve with lyrica. She denies other complaints, is oriented appropriately. When asked explicitly if she has any suicidal or homicidal thoughts, she states no, she has no interest in killing herself, she is too vain for that.  With patient with history of psychiatric disease presents after possible overdose. Patient is initially obtunded, but awake, alert, interactive, oriented appropriately, complaining only of typical right leg pain.  The patient does have some persistent tachycardia, this may be secondary to her ingestion, but no other hemodynamic stability, no other complaints. Patient denies any suicidal or homicidal ideation, given her return to normal interactivity, was discharged with encouragement to follow-up closely with her physician, and to only take medication as directed.  Final Clinical  Impressions(s) / ED Diagnoses  Altered mental status   Carmin Muskrat, MD 05/20/17 1528    Carmin Muskrat, MD 05/20/17 [phone]

## 2017-05-20 NOTE — ED Notes (Signed)
Bed: WA04 Expected date:  Expected time:  Means of arrival:  Comments: EMS overdose

## 2017-05-20 NOTE — H&P (Signed)
Jenna Hill WUX:[id number] DOB: 05-Sep-1970 DOA: 05/20/2017     PCP: Alfonse Spruce, FNP       Patient arrived to ER on 05/20/17 at 0907  Patient coming from:  home Lives alone,    Chief Complaint:  Chief Complaint  Patient presents with  . Drug Overdose    HPI: Jenna Hill is a 47 y.o. female with medical history significant  of bipolar disorder, schizophrenia, HTN , history of overdose/sucide attempts     Presented with being overly sedated found by roommate responsive only to pain given prior history of overdose EMS was called roommate is unsure what patient has taken was noted to be tachycardic setting 100% on room air in the emergency department.  The roommate is concerned that patient may be taking additional medications on top of what she really been prescribed it is unclear if she is doing that with intention of self-harm urges being confused Recently admitted after was found down on 05 April 2017 at that point she was found to be tachycardic hypothermic and had a KI felt to be secondary due to suicide attempt requiring intubation complicated by aspiration pneumonia and rhabdomyolysis requiring brief course of hemodialysis was able to come off of hemodialysis on 20 March she had significant anemia which was treated with transfusion iron infusions and Aranesp. She was also found to have pancreatitis on CT but she was not complaining about any abdominal pain she had one-sided weakness which was felt to be secondary to rhabdomyolysis and was discharged to inpatient rehab on 29 March he was able to be discharged to home on 12 May 2017 1 week ago     While in ER: Poison control consulted clear what medications patient took so she was supposed to be monitored for QRS prolongation If QRS widens, give Bicarb IV pushes 1-72mEq/kg. If QTC becomes prolonged, recheck magnesium and potassium levels and administer as need to keep levels WNL  Patient was ready for discharge but was  noted to be persistently tachycardic up to 140s sinus tachycardia .  Review of records patient appears to have a history of tachycardia persistently though usually runs somewhere between 110 to 120s Following Medications were ordered in ER: Medications  magnesium sulfate IVPB 2 g 50 mL (0 g Intravenous Stopped 05/20/17 1327)  0.9 % NaCl with KCl 20 mEq/ L  infusion ( Intravenous Stopped 05/20/17 1537)  pregabalin (LYRICA) capsule 50 mg (50 mg Oral Given 05/20/17 1548)    Significant initial  Findings: Abnormal Labs Reviewed  COMPREHENSIVE METABOLIC PANEL - Abnormal; Notable for the following components:      Result Value   Potassium 3.2 (*)    CO2 19 (*)    Glucose, Bld 169 (*)    Creatinine, Ser 1.35 (*)    GFR calc non Af Amer 46 (*)    GFR calc Af Amer 54 (*)    All other components within normal limits  ACETAMINOPHEN LEVEL - Abnormal; Notable for the following components:   Acetaminophen (Tylenol), Serum <10 (*)    All other components within normal limits  RAPID URINE DRUG SCREEN, HOSP PERFORMED - Abnormal; Notable for the following components:   Benzodiazepines POSITIVE (*)    All other components within normal limits  CBC WITH DIFFERENTIAL/PLATELET - Abnormal; Notable for the following components:   WBC 14.1 (*)    Platelets 410 (*)    Neutro Abs 10.7 (*)    All other components within normal limits  CK -  Abnormal; Notable for the following components:   Total CK 253 (*)    All other components within normal limits  MAGNESIUM - Abnormal; Notable for the following components:   Magnesium 1.3 (*)    All other components within normal limits  CBG MONITORING, ED - Abnormal; Notable for the following components:   Glucose-Capillary 132 (*)    All other components within normal limits    Na 139 K 3.2 replacement ordered in the ER Magnesium 1.3 Cr coming down Lab Results  Component Value Date   CREATININE 1.35 (H) 05/20/2017   CREATININE 1.91 (H) 05/09/2017   CREATININE  2.68 (H) 05/06/2017   CK 253 improved from prior     HG/HCT stable,       Component Value Date/Time   HGB 12.4 05/20/2017 0930   HCT 38.1 05/20/2017 0930     BNP (last 3 results) Recent Labs    04/05/17 1751  BNP 36.9       UA  ordered   ECG:  Personally reviewed by me showing: HR : 142 Rhythm: sinus tachycardia   no evidence of ischemic changes QTC 363     ED Triage Vitals  Enc Vitals Group     BP 05/20/17 0920 134/90     Pulse Rate 05/20/17 0920 (!) 146     Resp 05/20/17 0920 20     Temp 05/20/17 0920 98.9 F (37.2 C)     Temp Source 05/20/17 0920 Axillary     SpO2 05/20/17 0920 100 %     Weight --      Height --      Head Circumference --      Peak Flow --      Pain Score 05/20/17 1710 5     Pain Loc --      Pain Edu? --      Excl. in [place]? --   TMAX(24)@       Latest  Blood pressure (!) 156/102, pulse (!) 142, temperature 98.8 F (37.1 C), temperature source Axillary, resp. rate (!) 32, SpO2 100 %.   Hospitalist was called for admission for overdose   Review of Systems:    Pertinent positives include: fatigue,  Constitutional:  No weight loss, night sweats, Fevers, chills,  weight loss  HEENT:  No headaches, Difficulty swallowing,Tooth/dental problems,Sore throat,  No sneezing, itching, ear ache, nasal congestion, post nasal drip,  Cardio-vascular:  No chest pain, Orthopnea, PND, anasarca, dizziness, palpitations.no Bilateral lower extremity swelling  GI:  No heartburn, indigestion, abdominal pain, nausea, vomiting, diarrhea, change in bowel habits, loss of appetite, melena, blood in stool, hematemesis Resp:  no shortness of breath at rest. No dyspnea on exertion, No excess mucus, no productive cough, No non-productive cough, No coughing up of blood.No change in color of mucus.No wheezing. Skin:  no rash or lesions. No jaundice GU:  no dysuria, change in color of urine, no urgency or frequency. No straining to urinate.  No flank pain.    Musculoskeletal:  No joint pain or no joint swelling. No decreased range of motion. No back pain.  Psych:  No change in mood or affect. No depression or anxiety. No memory loss.  Neuro: no localizing neurological complaints, no tingling, no weakness, no double vision, no gait abnormality, no slurred speech, no confusion  As per HPI otherwise 10 point review of systems negative.   Past Medical History:   Past Medical History:  Diagnosis Date  . Bipolar 1 disorder ([place])   .  Difficult intubation   . Hypertension   . PONV (postoperative nausea and vomiting)   . Schizophrenia ([place])   . Spinal headache       Past Surgical History:  Procedure Laterality Date  . ANKLE ARTHROSCOPY Right   . CESAREAN SECTION    . HAND SURGERY Right   . ORIF ANKLE FRACTURE Left 01/29/2016   Procedure: OPEN REDUCTION INTERNAL FIXATION LEFT ANKLE BIMALLEOLAR FRACTURE AND SYNDESMOSIS;  Surgeon: Wylene Simmer, MD;  Location: [facility];  Service: Orthopedics;  Laterality: Left;    Social History:  Ambulatory   independently     reports that she has never smoked. She has never used smokeless tobacco. She reports that she drinks alcohol. She reports that she does not use drugs.     Family History:   Family History  Problem Relation Age of Onset  . Healthy Mother   . Healthy Father   . Healthy Sister     Allergies: Allergies  Allergen Reactions  . Bee Venom Anaphylaxis  . Codeine Swelling    Facial swelling  . Other Anaphylaxis    bertrillium or bertillium    . Toradol [Ketorolac Tromethamine] Swelling    Tongue swelling  . Lortab [Hydrocodone-Acetaminophen] Hives  . Percocet [Oxycodone-Acetaminophen] Hives  . Ibuprofen     Raised hives Also with naproxen  . Latex Rash     Prior to Admission medications   Medication Sig Start Date End Date Taking? Authorizing Provider  acetaminophen (TYLENOL) 325 MG tablet Take 2 tablets (650 mg total) by mouth every 6 (six) hours as  needed for moderate pain or headache. 04/29/17   Velvet Bathe, MD  amLODipine (NORVASC) 10 MG tablet Take 1 tablet (10 mg total) by mouth daily with supper. 05/12/17   Love, Ivan Anchors, PA-C  clozapine (CLOZARIL) 200 MG tablet Take 400 mg by mouth at bedtime.     [provider]  diclofenac sodium (VOLTAREN) 1 % GEL Apply 4 g topically 4 (four) times daily. 05/12/17   Love, Ivan Anchors, PA-C  doxepin (SINEQUAN) 50 MG capsule Take 1 capsule (50 mg total) by mouth at bedtime. 05/12/17   Love, Ivan Anchors, PA-C  gabapentin (NEURONTIN) 300 MG capsule Take 1 capsule (300 mg total) by mouth at bedtime. 05/12/17   Love, Ivan Anchors, PA-C  loratadine (ALLERGY RELIEF) 10 MG tablet Take 1 tablet (10 mg total) by mouth daily as needed for allergies. 05/12/17   Love, Ivan Anchors, PA-C  metoprolol tartrate (LOPRESSOR) 100 MG tablet Take 1 tablet (100 mg total) by mouth 2 (two) times daily. 05/12/17   Love, Ivan Anchors, PA-C  pantoprazole (PROTONIX) 40 MG tablet Take 1 tablet (40 mg total) by mouth 2 (two) times daily before a meal. 05/12/17   Love, Ivan Anchors, PA-C  potassium chloride SA (K-DUR,KLOR-CON) 20 MEQ tablet Take 1 tablet (20 mEq total) by mouth daily. 05/13/17   Love, Ivan Anchors, PA-C  pregabalin (LYRICA) 50 MG capsule Take 1 capsule (50 mg total) by mouth 3 (three) times daily. 05/20/17   Carmin Muskrat, MD  QUEtiapine (SEROQUEL XR) 400 MG 24 hr tablet Take 400 mg by mouth at bedtime.     [provider]  traMADol (ULTRAM) 50 MG tablet Take 2 tablets (100 mg total) by mouth every 12 (twelve) hours as needed for severe pain (breakthrough). 05/12/17   Love, Ivan Anchors, PA-C  TRINTELLIX 20 MG TABS tablet Take 20 mg by mouth daily. With food 12/19/15   [provider]  Physical Exam: Blood pressure (!) 156/102, pulse (!) 142, temperature 98.8 F (37.1 C), temperature source Axillary, resp. rate (!) 32, SpO2 100 %. 1. General:  in No Acute distress  Chronically ill  -appearing 2. Psychological: Alert and  Oriented 3. Head/ENT:     Dry Mucous Membranes                          Head Non traumatic, neck supple                          Normal   Dentition 4. SKIN:   decreased Skin turgor,  Skin clean Dry and intact no rash 5. Heart: Regular rate and rhythm   Murmur, no Rub or gallop 6. Lungs:   no wheezes or crackles   7. Abdomen: Soft,  non-tender, Non distended   obese  bowel sounds present 8. Lower extremities: no clubbing, cyanosis, or edema 9. Neurologically Grossly intact, moving all 4 extremities equally  10. MSK: Normal range of motion   LABS:     Recent Labs  Lab 05/20/17 0930  WBC 14.1*  NEUTROABS 10.7*  HGB 12.4  HCT 38.1  MCV 90.7  PLT 643*   Basic Metabolic Panel: Recent Labs  Lab 05/20/17 0931  NA 139  K 3.2*  CL 106  CO2 19*  GLUCOSE 169*  BUN 14  CREATININE 1.35*  CALCIUM 9.4  MG 1.3*      Recent Labs  Lab 05/20/17 0931  AST 25  ALT 25  ALKPHOS 63  BILITOT 0.6  PROT 7.7  ALBUMIN 3.7   No results for input(s): LIPASE, AMYLASE in the last 168 hours. No results for input(s): AMMONIA in the last 168 hours.    HbA1C: No results for input(s): HGBA1C in the last 72 hours. CBG: Recent Labs  Lab 05/20/17 0936  GLUCAP 132*      Urine analysis:    Component Value Date/Time   COLORURINE YELLOW 05/06/2017 1153   APPEARANCEUR HAZY (A) 05/06/2017 1153   LABSPEC 1.017 05/06/2017 1153   PHURINE 7.0 05/06/2017 1153   GLUCOSEU NEGATIVE 05/06/2017 1153   HGBUR SMALL (A) 05/06/2017 1153   BILIRUBINUR NEGATIVE 05/06/2017 1153   KETONESUR NEGATIVE 05/06/2017 1153   PROTEINUR 30 (A) 05/06/2017 1153   UROBILINOGEN 0.2 08/02/2008 0812   NITRITE NEGATIVE 05/06/2017 1153   LEUKOCYTESUR MODERATE (A) 05/06/2017 1153     Cultures:    Component Value Date/Time   SDES URINE, CLEAN CATCH 05/06/2017 1234   SPECREQUEST NONE 05/06/2017 1234   CULT (A) 05/06/2017 1234    <10,000 COLONIES/mL INSIGNIFICANT GROWTH Performed at [facility]  [address], [place], [place] [address]    REPTSTATUS 05/07/2017 FINAL 05/06/2017 1234     Radiological Exams on Admission: No results found.  Chart has been reviewed    Assessment/Plan  47 y.o. female with medical history significant  of bipolar disorder, schizophrenia, HTN , history of overdose/sucide attempts      Admitted for overdose  Present on Admission: . Sinus tachycardia tonic but at baseline usually close to 100 check TSH have had recent echogram showed diastolic CHF, restart home dose of metoprolol and rehydrate being mindful to avoid fluid overload . Bipolar 1 disorder ([place]) behavioral health consult to help manage medications as patient at risk of recurrent overdoses . Hypertension restart metoprolol . Schizoaffective disorder, depressive type [facility]) appreciate psychiatry help with management . Overdose  unclear what patient took will continue to monitor on telemetry repeat EKG in a.m. . Acute encephalopathy currently improving likely secondary to overdose continue to monitor . Chronic diastolic CHF (congestive heart failure) (HCC) currently appears to be somewhat on the dry side will rehydrate and monitor   Other plan as per orders.  DVT prophylaxis:    Lovenox     Code Status:  FULL CODE   Family Communication:   Family not at  Bedside    Disposition Plan:       To home once workup is complete and patient is stable   Would benefit from PT/OT eval prior to DC  ordered had trouble ambulating to remain                                             Consults called: Behavioral health consult ordered  Admission status:     obs   Level of care    stepdown to telemetry if heart rate closer to baseline        Jenna Hill 05/20/2017, 7:45 PM    [facility]  Pager [phone]   after 2 AM please page floor coverage PA If 7AM-7PM, please contact the day team taking care of the patient  [email or web address]  Password [email or web address]

## 2017-05-20 NOTE — ED Notes (Addendum)
Poison Control called. This Probation officer spoke with Barnett Applebaum. Due to the unknown medications the patient has ingested, recommendations include: Check patient's Magnesium and Creatine kinase levels. If QRS widens, give Bicarb IV pushes 1-89mEq/kg. If QTC becomes prolonged, recheck magnesium and potassium levels and administer as need to keep levels WNL. If the patient experiences seizures, administer benzodiazepines.  Observe patient for at least 6 hours post arrival, or until patient is AxOx4 and at neuro baseline.  EDMD Lockhart notified.

## 2017-05-20 NOTE — ED Notes (Signed)
ED TO INPATIENT HANDOFF REPORT  Name/Age/Gender Jenna Hill 47 y.o. female  Code Status Code Status History    Date Active Date Inactive Code Status Order ID Comments User Context   04/29/2017 1757 05/12/2017 1753 DNR [id number]  Bary Leriche, PA-C Inpatient   04/29/2017 1650 04/29/2017 1757 Full Code [id number]  Bary Leriche, PA-C Inpatient   04/05/2017 1600 04/29/2017 1647 Full Code [id number]  Donita Brooks, NP ED   04/15/2015 1956 04/24/2015 2116 Full Code [id number]  Hildred Priest, MD Inpatient   04/14/2015 2241 04/15/2015 1956 Full Code [id number]  Nat Christen, MD ED   10/05/2012 0949 10/07/2012 1305 Full Code [id number]  Chesley Mires, MD ED    Questions for Most Recent Historical Code Status (Order [id number])    Question Answer Comment   In the event of cardiac or respiratory ARREST Do not call a "code blue"    In the event of cardiac or respiratory ARREST Do not perform Intubation, CPR, defibrillation or ACLS    In the event of cardiac or respiratory ARREST Use medication by any route, position, wound care, and other measures to relive pain and suffering. May use oxygen, suction and manual treatment of airway obstruction as needed for comfort.         Advance Directive Documentation     Most Recent Value  Type of Advance Directive  Healthcare Power of Attorney  Pre-existing out of facility DNR order (yellow form or pink MOST form)  -  "MOST" Form in Place?  -      Home/SNF/Other Home  Chief Complaint Possible OD  Level of Care/Admitting Diagnosis ED Disposition    ED Disposition Condition [place]: [place] [100102]  Level of Care: Telemetry [5]  Admit to tele based on following criteria: Other see comments  Comments: tachycardia  Diagnosis: Overdose [196222]  Admitting Physician: Toy Baker [[id number]]  Attending Physician: Toy Baker [[id number]]  PT Class (Do Not Modify): Observation [104]  PT Acc Code (Do  Not Modify): Observation [10022]       Medical History Past Medical History:  Diagnosis Date  . Bipolar 1 disorder ([place])   . Difficult intubation   . Hypertension   . PONV (postoperative nausea and vomiting)   . Schizophrenia ([place])   . Spinal headache     Allergies Allergies  Allergen Reactions  . Bee Venom Anaphylaxis  . Codeine Swelling    Facial swelling  . Other Anaphylaxis    bertrillium or bertillium    . Toradol [Ketorolac Tromethamine] Swelling    Tongue swelling  . Lortab [Hydrocodone-Acetaminophen] Hives  . Percocet [Oxycodone-Acetaminophen] Hives  . Ibuprofen     Raised hives Also with naproxen  . Latex Rash    IV Location/Drains/Wounds Patient Lines/Drains/Airways Status   Active Line/Drains/Airways    Name:   Placement date:   Placement time:   Site:   Days:   Peripheral IV 05/20/17 Right Antecubital   05/20/17    1812    Antecubital   less than 1          Labs/Imaging Results for orders placed or performed during the hospital encounter of 05/20/17 (from the past 48 hour(s))  CBC WITH DIFFERENTIAL     Status: Abnormal   Collection Time: 05/20/17  9:30 AM  Result Value Ref Range   WBC 14.1 (H) 4.0 - 10.5 K/uL   RBC 4.20 3.87 - 5.11 MIL/uL   Hemoglobin 12.4 12.0 - 15.0 g/dL  HCT 38.1 36.0 - 46.0 %   MCV 90.7 78.0 - 100.0 fL   MCH 29.5 26.0 - 34.0 pg   MCHC 32.5 30.0 - 36.0 g/dL   RDW 13.6 11.5 - 15.5 %   Platelets 410 (H) 150 - 400 K/uL   Neutrophils Relative % 76 %   Neutro Abs 10.7 (H) 1.7 - 7.7 K/uL   Lymphocytes Relative 18 %   Lymphs Abs 2.6 0.7 - 4.0 K/uL   Monocytes Relative 6 %   Monocytes Absolute 0.8 0.1 - 1.0 K/uL   Eosinophils Relative 0 %   Eosinophils Absolute 0.1 0.0 - 0.7 K/uL   Basophils Relative 0 %   Basophils Absolute 0.0 0.0 - 0.1 K/uL    Comment: Performed at [facility], [place] [address], [place], [place] [address]  Comprehensive metabolic panel     Status: Abnormal   Collection Time: 05/20/17   9:31 AM  Result Value Ref Range   Sodium 139 135 - 145 mmol/L   Potassium 3.2 (L) 3.5 - 5.1 mmol/L   Chloride 106 101 - 111 mmol/L   CO2 19 (L) 22 - 32 mmol/L   Glucose, Bld 169 (H) 65 - 99 mg/dL   BUN 14 6 - 20 mg/dL   Creatinine, Ser 1.35 (H) 0.44 - 1.00 mg/dL   Calcium 9.4 8.9 - 10.3 mg/dL   Total Protein 7.7 6.5 - 8.1 g/dL   Albumin 3.7 3.5 - 5.0 g/dL   AST 25 15 - 41 U/L   ALT 25 14 - 54 U/L   Alkaline Phosphatase 63 38 - 126 U/L   Total Bilirubin 0.6 0.3 - 1.2 mg/dL   GFR calc non Af Amer 46 (L) >60 mL/min   GFR calc Af Amer 54 (L) >60 mL/min    Comment: (NOTE) The eGFR has been calculated using the CKD EPI equation. This calculation has not been validated in all clinical situations. eGFR's persistently <60 mL/min signify possible Chronic Kidney Disease.    Anion gap 14 5 - 15    Comment: Performed at [facility], [place] [address] [place], [place] [address]  Ethanol     Status: None   Collection Time: 05/20/17  9:31 AM  Result Value Ref Range   Alcohol, Ethyl (B) <10 <10 mg/dL    Comment:        LOWEST DETECTABLE LIMIT FOR SERUM ALCOHOL IS 10 mg/dL FOR MEDICAL PURPOSES ONLY Performed at [facility] [address], [place], [place] [address]   Salicylate level     Status: None   Collection Time: 05/20/17  9:31 AM  Result Value Ref Range   Salicylate Lvl <3.9 2.8 - 30.0 mg/dL    Comment: Performed at [facility], [place] [address] [place], [place] [address]  Acetaminophen level     Status: Abnormal   Collection Time: 05/20/17  9:31 AM  Result Value Ref Range   Acetaminophen (Tylenol), Serum <10 (L) 10 - 30 ug/mL    Comment:        THERAPEUTIC CONCENTRATIONS VARY SIGNIFICANTLY. A RANGE OF 10-30 ug/mL MAY BE AN EFFECTIVE CONCENTRATION FOR MANY PATIENTS. HOWEVER, SOME ARE BEST TREATED AT CONCENTRATIONS OUTSIDE THIS RANGE. ACETAMINOPHEN CONCENTRATIONS >150 ug/mL AT 4 HOURS AFTER INGESTION AND >50 ug/mL AT  12 HOURS AFTER INGESTION ARE OFTEN ASSOCIATED WITH TOXIC REACTIONS. Performed at [facility], [place] [address] [place], [place] [address]   CK     Status: Abnormal  Collection Time: 05/20/17  9:31 AM  Result Value Ref Range   Total CK 253 (H) 38 - 234 U/L    Comment: Performed at [facility], [place] [address], [place], [place] [address]  Magnesium     Status: Abnormal   Collection Time: 05/20/17  9:31 AM  Result Value Ref Range   Magnesium 1.3 (L) 1.7 - 2.4 mg/dL    Comment: Performed at [facility], [place] [address], [place], [place] [address]  TSH     Status: None   Collection Time: 05/20/17  9:31 AM  Result Value Ref Range   TSH 1.012 0.350 - 4.500 uIU/mL    Comment: Performed by a 3rd Generation assay with a functional sensitivity of <=0.01 uIU/mL. Performed at [facility], [place] [address], [place], [place] [address]   I-Stat beta hCG blood, ED     Status: None   Collection Time: 05/20/17  9:32 AM  Result Value Ref Range   I-stat hCG, quantitative <5.0 <5 mIU/mL   Comment 3            Comment:   GEST. AGE      CONC.  (mIU/mL)   <=1 WEEK        5 - 50     2 WEEKS       50 - 500     3 WEEKS       100 - 10,000     4 WEEKS     1,000 - 30,000        FEMALE AND NON-PREGNANT FEMALE:     LESS THAN 5 mIU/mL   CBG monitoring, ED     Status: Abnormal   Collection Time: 05/20/17  9:36 AM  Result Value Ref Range   Glucose-Capillary 132 (H) 65 - 99 mg/dL  Urine rapid drug screen (hosp performed)     Status: Abnormal   Collection Time: 05/20/17  5:30 PM  Result Value Ref Range   Opiates NONE DETECTED NONE DETECTED   Cocaine NONE DETECTED NONE DETECTED   Benzodiazepines POSITIVE (A) NONE DETECTED   Amphetamines NONE DETECTED NONE DETECTED   Tetrahydrocannabinol NONE DETECTED NONE DETECTED   Barbiturates NONE DETECTED NONE DETECTED    Comment: (NOTE) DRUG SCREEN FOR MEDICAL PURPOSES ONLY.  IF CONFIRMATION  IS NEEDED FOR ANY PURPOSE, NOTIFY LAB WITHIN 5 DAYS. LOWEST DETECTABLE LIMITS FOR URINE DRUG SCREEN Drug Class                     Cutoff (ng/mL) Amphetamine and metabolites    1000 Barbiturate and metabolites    200 Benzodiazepine                 094 Tricyclics and metabolites     300 Opiates and metabolites        300 Cocaine and metabolites        300 THC                            50 Performed at [facility], [place] [address], [place], [place] [address]    No results found.  Pending Labs Unresulted Labs (From admission, onward)   Start     Ordered   05/20/17 1933  T4, free  Add-on,   R     05/20/17 1932   05/20/17 1933  T3  Add-on,   R     05/20/17 1932  Signed and Held  Urinalysis, Routine w reflex microscopic  Once,   R     Signed and Held   Signed and Held  Magnesium  Tomorrow morning,   R    Comments:  Call MD if <1.5    Signed and Held   Signed and Held  Phosphorus  Tomorrow morning,   R     Signed and Held   Signed and Held  Comprehensive metabolic panel  Once,   R    Comments:  Cal MD for K<3.5 or >5.0    Signed and Held   Signed and Held  CBC  Once,   R    Comments:  Call for hg <8.0    Signed and Held      Vitals/Pain Today's Vitals   05/20/17 2110 05/20/17 2130 05/20/17 2148 05/20/17 2156  BP: (!) 161/113 113/74    Pulse: (!) 138 (!) 147 (!) 135 (!) 126  Resp:  20 (!) 22 (!) 22  Temp:      TempSrc:      SpO2:  99% 100% 100%  PainSc:        Isolation Precautions No active isolations  Medications Medications  metoprolol tartrate (LOPRESSOR) tablet 100 mg (100 mg Oral Given 05/20/17 2110)  sodium chloride 0.9 % bolus 1,000 mL (1,000 mLs Intravenous New Bag/Given 05/20/17 2109)  magnesium sulfate IVPB 2 g 50 mL (0 g Intravenous Stopped 05/20/17 1327)  0.9 % NaCl with KCl 20 mEq/ L  infusion ( Intravenous Stopped 05/20/17 1537)  pregabalin (LYRICA) capsule 50 mg (50 mg Oral Given 05/20/17 1548)  LORazepam (ATIVAN) injection 1  mg (1 mg Intravenous Given 05/20/17 1853)    Mobility walks with device and assist

## 2017-05-20 NOTE — ED Provider Notes (Signed)
Patient was seen by Dr. Vanita Panda earlier.  Please see his note.  Initial plan was for the patient to be discharged however nursing approached me because of her persistent tachycardia.  Her most recent heart rate is in the 140s.  We will repeat her EKG and plan on medical admission.  5:58 PM I went to evaluate the patient after being told a few minutes earlier about her heart rate.  Pt is not in the room and has already been discharged.     Dorie Rank, MD 05/20/17 [phone]

## 2017-05-20 NOTE — Discharge Instructions (Signed)
Monitor your condition carefully, and return here for concerning changes. Otherwise be sure to follow-up with your physician.

## 2017-05-20 NOTE — ED Notes (Signed)
Patient family arrived and patient set for discharge, but patient remains tachycardic in the 140s. MD Vanita Panda is no longer on shift, so this Probation officer spoke with MD Tomi Bamberger regarding my concerns. MD Tomi Bamberger agreed that the patient was not safe to be discharged and the patient was placed back in her room. EKG completed. Hospitalist consulted. Patient updated on POC and agrees to admission

## 2017-05-21 ENCOUNTER — Observation Stay (HOSPITAL_COMMUNITY): Payer: Medicaid Other

## 2017-05-21 ENCOUNTER — Encounter (HOSPITAL_COMMUNITY): Payer: Self-pay

## 2017-05-21 ENCOUNTER — Other Ambulatory Visit: Payer: Self-pay

## 2017-05-21 ENCOUNTER — Observation Stay (HOSPITAL_BASED_OUTPATIENT_CLINIC_OR_DEPARTMENT_OTHER): Payer: Medicaid Other

## 2017-05-21 DIAGNOSIS — F319 Bipolar disorder, unspecified: Secondary | ICD-10-CM | POA: Diagnosis present

## 2017-05-21 DIAGNOSIS — G4700 Insomnia, unspecified: Secondary | ICD-10-CM

## 2017-05-21 DIAGNOSIS — Z62810 Personal history of physical and sexual abuse in childhood: Secondary | ICD-10-CM | POA: Diagnosis present

## 2017-05-21 DIAGNOSIS — F419 Anxiety disorder, unspecified: Secondary | ICD-10-CM | POA: Diagnosis not present

## 2017-05-21 DIAGNOSIS — E860 Dehydration: Secondary | ICD-10-CM | POA: Diagnosis present

## 2017-05-21 DIAGNOSIS — M7989 Other specified soft tissue disorders: Secondary | ICD-10-CM | POA: Diagnosis not present

## 2017-05-21 DIAGNOSIS — R401 Stupor: Secondary | ICD-10-CM | POA: Diagnosis not present

## 2017-05-21 DIAGNOSIS — I5032 Chronic diastolic (congestive) heart failure: Secondary | ICD-10-CM | POA: Diagnosis present

## 2017-05-21 DIAGNOSIS — I10 Essential (primary) hypertension: Secondary | ICD-10-CM | POA: Diagnosis not present

## 2017-05-21 DIAGNOSIS — F250 Schizoaffective disorder, bipolar type: Secondary | ICD-10-CM | POA: Diagnosis not present

## 2017-05-21 DIAGNOSIS — Z9181 History of falling: Secondary | ICD-10-CM | POA: Diagnosis not present

## 2017-05-21 DIAGNOSIS — Z91030 Bee allergy status: Secondary | ICD-10-CM | POA: Diagnosis not present

## 2017-05-21 DIAGNOSIS — Z79891 Long term (current) use of opiate analgesic: Secondary | ICD-10-CM | POA: Diagnosis not present

## 2017-05-21 DIAGNOSIS — R451 Restlessness and agitation: Secondary | ICD-10-CM | POA: Diagnosis not present

## 2017-05-21 DIAGNOSIS — Z915 Personal history of self-harm: Secondary | ICD-10-CM | POA: Diagnosis not present

## 2017-05-21 DIAGNOSIS — M21371 Foot drop, right foot: Secondary | ICD-10-CM | POA: Diagnosis present

## 2017-05-21 DIAGNOSIS — G92 Toxic encephalopathy: Secondary | ICD-10-CM | POA: Diagnosis present

## 2017-05-21 DIAGNOSIS — Z885 Allergy status to narcotic agent status: Secondary | ICD-10-CM | POA: Diagnosis not present

## 2017-05-21 DIAGNOSIS — F251 Schizoaffective disorder, depressive type: Secondary | ICD-10-CM | POA: Diagnosis present

## 2017-05-21 DIAGNOSIS — G4733 Obstructive sleep apnea (adult) (pediatric): Secondary | ICD-10-CM | POA: Diagnosis present

## 2017-05-21 DIAGNOSIS — Z888 Allergy status to other drugs, medicaments and biological substances status: Secondary | ICD-10-CM | POA: Diagnosis not present

## 2017-05-21 DIAGNOSIS — Z79899 Other long term (current) drug therapy: Secondary | ICD-10-CM | POA: Diagnosis not present

## 2017-05-21 DIAGNOSIS — R4587 Impulsiveness: Secondary | ICD-10-CM | POA: Diagnosis not present

## 2017-05-21 DIAGNOSIS — Z818 Family history of other mental and behavioral disorders: Secondary | ICD-10-CM

## 2017-05-21 DIAGNOSIS — G9340 Encephalopathy, unspecified: Secondary | ICD-10-CM | POA: Diagnosis not present

## 2017-05-21 DIAGNOSIS — Z91040 Latex allergy status: Secondary | ICD-10-CM | POA: Diagnosis not present

## 2017-05-21 DIAGNOSIS — R000 Tachycardia, unspecified: Secondary | ICD-10-CM | POA: Diagnosis present

## 2017-05-21 DIAGNOSIS — I110 Hypertensive heart disease with heart failure: Secondary | ICD-10-CM | POA: Diagnosis present

## 2017-05-21 DIAGNOSIS — E876 Hypokalemia: Secondary | ICD-10-CM | POA: Diagnosis present

## 2017-05-21 DIAGNOSIS — T50901A Poisoning by unspecified drugs, medicaments and biological substances, accidental (unintentional), initial encounter: Secondary | ICD-10-CM | POA: Diagnosis present

## 2017-05-21 LAB — COMPREHENSIVE METABOLIC PANEL
ALBUMIN: 3 g/dL — AB (ref 3.5–5.0)
ALT: 19 U/L (ref 14–54)
ANION GAP: 10 (ref 5–15)
AST: 18 U/L (ref 15–41)
Alkaline Phosphatase: 52 U/L (ref 38–126)
BUN: 11 mg/dL (ref 6–20)
CHLORIDE: 111 mmol/L (ref 101–111)
CO2: 21 mmol/L — ABNORMAL LOW (ref 22–32)
Calcium: 8.4 mg/dL — ABNORMAL LOW (ref 8.9–10.3)
Creatinine, Ser: 0.93 mg/dL (ref 0.44–1.00)
GFR calc Af Amer: >60 mL/min (ref >60)
GFR calc non Af Amer: >60 mL/min (ref >60)
GLUCOSE: 93 mg/dL (ref 65–99)
POTASSIUM: 3.7 mmol/L (ref 3.5–5.1)
SODIUM: 142 mmol/L (ref 135–145)
Total Bilirubin: 0.6 mg/dL (ref 0.3–1.2)
Total Protein: 6.5 g/dL (ref 6.5–8.1)

## 2017-05-21 LAB — PHOSPHORUS: PHOSPHORUS: 3.7 mg/dL (ref 2.5–4.6)

## 2017-05-21 LAB — CBC
HEMATOCRIT: 32.5 % — AB (ref 36.0–46.0)
HEMOGLOBIN: 10.5 g/dL — AB (ref 12.0–15.0)
MCH: 29.2 pg (ref 26.0–34.0)
MCHC: 32.3 g/dL (ref 30.0–36.0)
MCV: 90.3 fL (ref 78.0–100.0)
Platelets: 377 10*3/uL (ref 150–400)
RBC: 3.6 MIL/uL — AB (ref 3.87–5.11)
RDW: 13.9 % (ref 11.5–15.5)
WBC: 13.7 10*3/uL — ABNORMAL HIGH (ref 4.0–10.5)

## 2017-05-21 LAB — T3: T3, Total: 103 ng/dL (ref 71–180)

## 2017-05-21 LAB — MAGNESIUM: Magnesium: 1.7 mg/dL (ref 1.7–2.4)

## 2017-05-21 MED ORDER — METOPROLOL TARTRATE 50 MG PO TABS
100.0000 mg | ORAL_TABLET | Freq: Two times a day (BID) | ORAL | Status: DC
  Administered 2017-05-21 – 2017-05-24 (×7): 100 mg via ORAL
  Filled 2017-05-21 (×7): qty 2

## 2017-05-21 MED ORDER — CLOZAPINE 100 MG PO TABS
400.0000 mg | ORAL_TABLET | Freq: Every day | ORAL | Status: DC
  Administered 2017-05-21 – 2017-05-23 (×3): 400 mg via ORAL
  Filled 2017-05-21 (×3): qty 4

## 2017-05-21 NOTE — Evaluation (Addendum)
Physical Therapy Evaluation Patient Details Name: Jenna Hill MRN: [id number] DOB: [birth year] Today's Date: 05/21/2017  Note: eval completed because pt climbing out of bed to get to bathroom as PT going by room and NT reported she had already been up to bathroom earlier in day. Limited activity to in room and will continue to await R foot xray to progress.   History of Present Illness  Pt was found overly sedated at home on floor by roommate.  This is similar to last admission per chart.  PMH:  schizophrenia, bipolar, OD/suicide attempts and HTN  Clinical Impression  Pt admitted with above diagnosis. Pt currently with functional limitations due to the deficits listed below (see PT Problem List). Discrepancies noted between what pt told OT in session and what pt mentioned in PT session. She reported that she has no boot for R foot and has been going to outpt PT to work on R ankle and knee. Seems that she does at least need AFO for RLE, ambulates with R hip hike to compensate for R foot drop. Impulsive with mobility. Ambulated 15' with RW and min A.  Pt will benefit from skilled PT to increase their independence and safety with mobility to allow discharge to the venue listed below.       Follow Up Recommendations Outpatient PT    Equipment Recommendations  None recommended by PT    Recommendations for Other Services       Precautions / Restrictions Precautions Precautions: Fall Precaution Comments: last admission's note states R foot drop and CAM boot, though pt states that she has been doing outpt PT and not wearing a boot  Required Braces or Orthoses: Other Brace/Splint Other Brace/Splint: R foot brace? Restrictions Weight Bearing Restrictions: No      Mobility  Bed Mobility Overal bed mobility: Needs Assistance Bed Mobility: Supine to Sit;Sit to Supine     Supine to sit: Supervision Sit to supine: Supervision   General bed mobility comments: pt uses momentum to get self to  EOB as well as getting LE's back into bed. Increased time and effort noted but did not require physical assist  Transfers Overall transfer level: Needs assistance Equipment used: Rolling walker (2 wheeled) Transfers: Sit to/from Stand Sit to Stand: Min guard         General transfer comment: pt impulsive with mobility, cues for safety and for using RW  Ambulation/Gait Ambulation/Gait assistance: Min assist Ambulation Distance (Feet): 15 Feet Assistive device: Rolling walker (2 wheeled) Gait Pattern/deviations: Decreased stance time - right;Decreased step length - right;Decreased dorsiflexion - right;Decreased weight shift to right Gait velocity: decreased Gait velocity interpretation: <1.8 ft/sec, indicate of risk for recurrent falls General Gait Details: R hip hike to compensate for foot drop. cues for safety  Stairs            Wheelchair Mobility    Modified Rankin (Stroke Patients Only)       Balance Overall balance assessment: Needs assistance;History of Falls Sitting-balance support: No upper extremity supported;Feet supported Sitting balance-Leahy Scale: Fair     Standing balance support: Bilateral upper extremity supported Standing balance-Leahy Scale: Poor Standing balance comment: requires RW for assist                             Pertinent Vitals/Pain Pain Assessment: Faces Faces Pain Scale: Hurts even more Pain Location: RLE Pain Descriptors / Indicators: Sore;Tightness;Tingling;Grimacing Pain Intervention(s): Limited activity within patient's tolerance;Monitored during session  Home Living Family/patient expects to be discharged to:: Private residence Living Arrangements: Alone Available Help at Discharge: Friend(s);Available PRN/intermittently Type of Home: Apartment Home Access: Stairs to enter Entrance Stairs-Rails: None Entrance Stairs-Number of Steps: 3-5 Home Layout: One level Home Equipment: Walker - 2 wheels Additional  Comments: pt states that she lives alone but has a friend that checks on her regularly and is the one that has found her down the past 2 times    Prior Function Level of Independence: Independent         Comments: pt states she is independent but question this. She has not been able to drive since her last admission due to foot drop so her friend has been driving her     Hand Dominance   Dominant Hand: Left    Extremity/Trunk Assessment   Upper Extremity Assessment Upper Extremity Assessment: Generalized weakness    Lower Extremity Assessment Lower Extremity Assessment: RLE deficits/detail RLE Deficits / Details: pf/ df 0/5, knee ext 3/5, knee flex 3/5 RLE Sensation: decreased light touch;decreased proprioception RLE Coordination: decreased fine motor LLE Deficits / Details: WFL grossly 4/5    Cervical / Trunk Assessment Cervical / Trunk Assessment: Normal  Communication   Communication: No difficulties  Cognition Arousal/Alertness: Awake/alert Behavior During Therapy: Impulsive Overall Cognitive Status: No family/caregiver present to determine baseline cognitive functioning                                 General Comments: poor historian, followed commands, was trying to climb out of bed with bed alarm going off when I entered room      General Comments General comments (skin integrity, edema, etc.): gave pt theraband to perform LE exercises that she has been doing at [employer] [facility]    Exercises     Assessment/Plan    PT Assessment Patient needs continued PT services  PT Problem List Decreased strength;Decreased activity tolerance;Decreased balance;Decreased mobility;Decreased coordination;Decreased knowledge of use of DME;Impaired sensation       PT Treatment Interventions DME instruction;Gait training;Functional mobility training;Therapeutic activities;Balance training;Patient/family education    PT Goals (Current goals can be found in the Care Plan  section)  Acute Rehab PT Goals Patient Stated Goal: return home PT Goal Formulation: With patient Time For Goal Achievement: 06/04/17 Potential to Achieve Goals: Fair    Frequency Min 3X/week   Barriers to discharge Decreased caregiver support      Co-evaluation               AM-PAC PT "6 Clicks" Daily Activity  Outcome Measure Difficulty turning over in bed (including adjusting bedclothes, sheets and blankets)?: A Little Difficulty moving from lying on back to sitting on the side of the bed? : A Little Difficulty sitting down on and standing up from a chair with arms (e.g., wheelchair, bedside commode, etc,.)?: A Little Help needed moving to and from a bed to chair (including a wheelchair)?: A Little Help needed walking in hospital room?: A Little Help needed climbing 3-5 steps with a railing? : A Lot 6 Click Score: 17    End of Session   Activity Tolerance: Patient tolerated treatment well Patient left: in bed;with call bell/phone within reach;with bed alarm set Nurse Communication: Mobility status PT Visit Diagnosis: Unsteadiness on feet (R26.81);Other abnormalities of gait and mobility (R26.89);Muscle weakness (generalized) (M62.81);Other symptoms and signs involving the nervous system (O96.295)    Time: 1140-1159 PT Time Calculation (min) (ACUTE  ONLY): 19 min   Charges:   PT Evaluation $PT Eval Moderate Complexity: 1 Mod     PT G Codes:        Leighton Roach, PT  Acute Rehab Services  [place] 05/21/2017, 2:20 PM

## 2017-05-21 NOTE — Progress Notes (Signed)
RLE venous duplex prelim: negative for DVT. Landry Mellow, RDMS, RVT  Can compare to previous negative study 04/10/17 for same indication.

## 2017-05-21 NOTE — Progress Notes (Signed)
PROGRESS NOTE    Jenna Hill  XNA:[id number] DOB: 1970-04-13 DOA: 05/20/2017 PCP: Alfonse Spruce, FNP   Brief Narrative: Patient is a 47 year old female with past medical history significant for bipolar disorder, schizophrenia, hypertension, history of overdose/suicide attempts who was brought to the emergency department after she was found to be sedated and responsive only to pain by her roommate .Benzos were positive on UDS.  Patient denies any intention of self-harm. She was recently admitted after was found unresponsive on 05 April 2017 with  suspected suicide attempt and  requiring intubation .Hospital course was complicated by aspiration pneumonia and rhabdomyolysis requiring brief course of hemodialysis and she was able to come off of hemodialysis . Patient was admitted for lethargy.  Psychiatric consulted and recommended inpatient psych admission.   Assessment & Plan:   Principal Problem:   Schizoaffective disorder, depressive type ([place]) Active Problems:   Hypertension   Acute encephalopathy   Bipolar 1 disorder (HCC)   Sinus tachycardia   Overdose   Chronic diastolic CHF (congestive heart failure) (HCC)  Schizoaffective disorder/bipolar disorder: Psychiatry following.  She had history of drug overdose and suicide attempt in the past.  Psychiatry recommends inpatient psychiatric admission.  Social worker consulted. Patient denies any drug overdose at this admission.  Benzodiazepine was positive in UDS. Psychiatry recommends one-to-one sitter for safety.  Restarted Clozaril. Recommended to pursue involuntary commitment if patient refuses voluntary psychiatric hospitalization or attempts to leave the hospital.She cannot leave AMA. Social worker consulted for assist on transferring to psych inpatient.  Acute encephalopathy: Resolved.  Currently she is alert and oriented x4.  Sinus tachycardia: Much improved this morning with IV fluids.  We will continue to  monitor.Metoprolol restarted.  Hypertension: We will resume her home meds.  We will continue to monitor blood pressure.  Chronic diastolic CHF: Started on gentle hydration because of dehydration.Currently stable.    Right lower extremity edema:Found to have swelling around the ankle and foot on the right side.  X-ray was not suggestive of any fracture dislocation.  US Doppler ordered, prelim is negative for DVT.  Continue supportive care.  Patient is medically stable to be discharged to inpatient psych as soon as the bed is available.   DVT prophylaxis: Lovenox Code Status: Full Family Communication: None present at the bedside Disposition Plan: Inpatient psych   Consultants: Psychiatry  Procedures:None  Antimicrobials:None  Subjective: Patient seen and examined the bedside this morning.  Remains comfortable.  Alert and oriented x4.  Complains of swelling of her right foot.  Denies suicidal attempt or overdose.  Objective: Vitals:   05/20/17 2308 05/21/17 0550 05/21/17 0849 05/21/17 0850  BP: 125/80 (!) 138/94 121/71 121/71  Pulse: (!) 109 (!) 108 (!) 115 (!) 113  Resp: 17 19    Temp: 98.5 F (36.9 C) 98.7 F (37.1 C)  97.8 F (36.6 C)  TempSrc: Oral Oral  Oral  SpO2: 100% 100%  100%  Weight: 104.9 kg (231 lb 4.2 oz)     Height: 5\' 3"  (1.6 m)       Intake/Output Summary (Last 24 hours) at 05/21/2017 1318 Last data filed at 05/21/2017 1000 Gross per 24 hour  Intake 2521.66 ml  Output 1000 ml  Net 1521.66 ml   Filed Weights   05/20/17 2308  Weight: 104.9 kg (231 lb 4.2 oz)    Examination:  General exam: Appears calm and comfortable ,Not in distress,average built HEENT:PERRL,Oral mucosa moist, Ear/Nose normal on gross exam Respiratory system: Bilateral equal air entry, normal  vesicular breath sounds, no wheezes or crackles  Cardiovascular system: S1 & S2 heard, RRR. No JVD, murmurs, rubs, gallops or clicks. No pedal edema. Gastrointestinal system: Abdomen is  nondistended, soft and nontender. No organomegaly or masses felt. Normal bowel sounds heard. Central nervous system: Alert and oriented. No focal neurological deficits. Extremities: No edema, no clubbing ,no cyanosis, distal peripheral pulses palpable. Edema of the right foot Skin: No rashes, lesions or ulcers,no icterus ,no pallor MSK: Normal muscle bulk,tone ,power Psychiatry: Judgement and insight appear normal. Mood & affect appropriate.     Data Reviewed: I have personally reviewed following labs and imaging studies  CBC: Recent Labs  Lab 05/20/17 0930 05/21/17 0432  WBC 14.1* 13.7*  NEUTROABS 10.7*  --   HGB 12.4 10.5*  HCT 38.1 32.5*  MCV 90.7 90.3  PLT 410* 101   Basic Metabolic Panel: Recent Labs  Lab 05/20/17 0931 05/21/17 0432  NA 139 142  K 3.2* 3.7  CL 106 111  CO2 19* 21*  GLUCOSE 169* 93  BUN 14 11  CREATININE 1.35* 0.93  CALCIUM 9.4 8.4*  MG 1.3* 1.7  PHOS  --  3.7   GFR: Estimated Creatinine Clearance: 87.6 mL/min (by C-G formula based on SCr of 0.93 mg/dL). Liver Function Tests: Recent Labs  Lab 05/20/17 0931 05/21/17 0432  AST 25 18  ALT 25 19  ALKPHOS 63 52  BILITOT 0.6 0.6  PROT 7.7 6.5  ALBUMIN 3.7 3.0*   No results for input(s): LIPASE, AMYLASE in the last 168 hours. No results for input(s): AMMONIA in the last 168 hours. Coagulation Profile: No results for input(s): INR, PROTIME in the last 168 hours. Cardiac Enzymes: Recent Labs  Lab 05/20/17 0931  CKTOTAL 253*   BNP (last 3 results) No results for input(s): PROBNP in the last 8760 hours. HbA1C: No results for input(s): HGBA1C in the last 72 hours. CBG: Recent Labs  Lab 05/20/17 0936  GLUCAP 132*   Lipid Profile: No results for input(s): CHOL, HDL, LDLCALC, TRIG, CHOLHDL, LDLDIRECT in the last 72 hours. Thyroid Function Tests: Recent Labs    05/20/17 0931  TSH 1.012  FREET4 0.90   Anemia Panel: No results for input(s): VITAMINB12, FOLATE, FERRITIN, TIBC,  IRON, RETICCTPCT in the last 72 hours. Sepsis Labs: No results for input(s): PROCALCITON, LATICACIDVEN in the last 168 hours.  No results found for this or any previous visit (from the past 240 hour(s)).       Radiology Studies: Dg Ankle Complete Right  Result Date: 05/21/2017 CLINICAL DATA:  Onset of right ankle swelling today. No known injury. EXAM: RIGHT ANKLE - COMPLETE 3+ VIEW COMPARISON:  Plain films right ankle 04/05/2015. FINDINGS: No acute bony or joint abnormality is seen. No evidence of arthropathy. Small focus of ossification of the distal interosseous membrane is unchanged and may be due to old trauma. No tibiotalar joint effusion. Soft tissues appear somewhat swollen. IMPRESSION: Mild appearing soft tissue swelling. No acute or focal bony abnormality. Electronically Signed   By: Inge Rise M.D.   On: 05/21/2017 12:03        Scheduled Meds: . clozapine  400 mg Oral QHS  . enoxaparin (LOVENOX) injection  40 mg Subcutaneous q morning - 75Z  . folic acid  1 mg Oral Daily  . metoprolol tartrate  100 mg Oral BID  . pantoprazole  40 mg Oral BID AC  . thiamine  100 mg Oral Daily   Continuous Infusions:   LOS: 0 days  Time spent: More than 50% of that time was spent in counseling and/or coordination of care.      Shelly Coss, MD [facility] Pager [phone]  If 7PM-7AM, please contact night-coverage [email or web address] Password [email or web address] 05/21/2017, 1:18 PM

## 2017-05-21 NOTE — Progress Notes (Signed)
PT Cancellation Note  Patient Details Name: Jenna Hill MRN: [id number] DOB: December 30, 1970   Cancelled Treatment:    Reason Eval/Treat Not Completed: Other (comment); will await ankle x-ray then complete PT eval.   Reginia Naas 05/21/2017, 11:48 AM  Magda Kiel, PT [phone] 05/21/2017

## 2017-05-21 NOTE — Evaluation (Signed)
Occupational Therapy Evaluation Patient Details Name: Jenna Hill MRN: [id number] DOB: 12/05/1970 Today's Date: 05/21/2017    History of Present Illness Pt was found overly sedated at home on floor by roommate.  This is similar to last admission per chart.  PMH:  schizophrenia, bipolar, OD/suicide attempts and HTN   Clinical Impression   This 47 year old female was admitted for the above.  Pt groggy during evaluation:  Unsure of PLOF.  Prior to last admission, she was independent. She currently needs min guard to min A. Will follow in acute setting with supervision level goals.    Follow Up Recommendations  SNF    Equipment Recommendations  3 in 1 bedside commode    Recommendations for Other Services       Precautions / Restrictions Precautions Precautions: Fall Precaution Comments: last admission's note states R foot drop and CAM boot to prevent this     Mobility Bed Mobility         Supine to sit: Min guard Sit to supine: Min guard   General bed mobility comments: HOB raised  Transfers   Equipment used: Rolling walker (2 wheeled)   Sit to Stand: Min assist         General transfer comment: cues for UE placement and steadying assistance    Balance                                           ADL either performed or assessed with clinical judgement   ADL Overall ADL's : Needs assistance/impaired     Grooming: Oral care;Set up;Sitting   Upper Body Bathing: Set up;Sitting   Lower Body Bathing: Minimal assistance;Sit to/from stand   Upper Body Dressing : Set up;Sitting   Lower Body Dressing: Minimal assistance;Moderate assistance;Sit to/from stand   Toilet Transfer: Minimal assistance;Ambulation;Comfort height toilet;Grab bars   Toileting- Clothing Manipulation and Hygiene: Min guard;Sitting/lateral lean         General ADL Comments: ambulated to bathroom with Rw and decreased WB on RLE.  When typing note, old information popped  up. Pt was using a Cam Boot to prevent R foot drop     Vision         Perception     Praxis      Pertinent Vitals/Pain Faces Pain Scale: Hurts even more Pain Location: RLE Pain Intervention(s): Limited activity within patient's tolerance;Monitored during session;Repositioned     Hand Dominance     Extremity/Trunk Assessment Upper Extremity Assessment Upper Extremity Assessment: Generalized weakness           Communication Communication Communication: (difficult to understand at times)   Cognition Arousal/Alertness: Lethargic;Suspect due to medications Behavior During Therapy: [facility] for tasks assessed/performed Overall Cognitive Status: No family/caregiver present to determine baseline cognitive functioning                                 General Comments: followed all commands; poor historian   General Comments       Exercises     Shoulder Instructions      Home Living Family/patient expects to be discharged to:: Private residence                                 Additional Comments: this  admission note says roommate; admission approximately 3 weeks ago says alone.       Prior Functioning/Environment          Comments: unsure        OT Problem List: Decreased strength;Impaired balance (sitting and/or standing);Decreased knowledge of precautions;Pain;Decreased safety awareness;Decreased activity tolerance;Decreased knowledge of use of DME or AE      OT Treatment/Interventions: Self-care/ADL training;Therapeutic exercise;Neuromuscular education;Energy conservation;DME and/or AE instruction;Therapeutic activities;Balance training;Patient/family education    OT Goals(Current goals can be found in the care plan section) Acute Rehab OT Goals Patient Stated Goal: none stated OT Goal Formulation: With patient Time For Goal Achievement: 06/04/17 Potential to Achieve Goals: Good ADL Goals Pt Will Perform Lower Body Bathing: with  supervision;sit to/from stand Pt Will Perform Lower Body Dressing: with supervision;sit to/from stand Pt Will Transfer to Toilet: with supervision;bedside commode;ambulating  OT Frequency: Min 2X/week   Barriers to D/C:            Co-evaluation              AM-PAC PT "6 Clicks" Daily Activity     Outcome Measure Help from another person eating meals?: None Help from another person taking care of personal grooming?: A Little Help from another person toileting, which includes using toliet, bedpan, or urinal?: A Little Help from another person bathing (including washing, rinsing, drying)?: A Little Help from another person to put on and taking off regular upper body clothing?: A Little Help from another person to put on and taking off regular lower body clothing?: A Lot 6 Click Score: 18   End of Session    Activity Tolerance: Patient limited by fatigue Patient left: in bed;with call bell/phone within reach;with bed alarm set  OT Visit Diagnosis: Muscle weakness (generalized) (M62.81);Pain Pain - Right/Left: Right Pain - part of body: Ankle and joints of foot                Time: 0927-0950 OT Time Calculation (min): 23 min Charges:  OT General Charges $OT Visit: 1 Visit OT Evaluation $OT Eval Low Complexity: 1 Low G-Codes:     [place], OTR/L [phone] 05/21/2017  Jenna Hill 05/21/2017, 10:18 AM

## 2017-05-21 NOTE — Plan of Care (Signed)
  Problem: Education: Goal: Knowledge of General Education information will improve Outcome: Progressing   Problem: Health Behavior/Discharge Planning: Goal: Ability to manage health-related needs will improve Outcome: Progressing   Problem: Clinical Measurements: Goal: Ability to maintain clinical measurements within normal limits will improve Outcome: Progressing Goal: Will remain free from infection Outcome: Progressing Goal: Diagnostic test results will improve Outcome: Progressing Goal: Respiratory complications will improve Outcome: Progressing Goal: Cardiovascular complication will be avoided Outcome: Progressing   Problem: Activity: Goal: Risk for activity intolerance will decrease Outcome: Progressing   Problem: Nutrition: Goal: Adequate nutrition will be maintained Outcome: Progressing   Problem: Elimination: Goal: Will not experience complications related to bowel motility Outcome: Progressing Goal: Will not experience complications related to urinary retention Outcome: Progressing   Problem: Pain Managment: Goal: General experience of comfort will improve Outcome: Progressing   Problem: Safety: Goal: Ability to remain free from injury will improve Outcome: Progressing   Problem: Skin Integrity: Goal: Risk for impaired skin integrity will decrease Outcome: Progressing   
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
## 2017-05-22 LAB — CBC WITH DIFFERENTIAL/PLATELET
BASOS PCT: 1 %
Basophils Absolute: 0.1 10*3/uL (ref 0.0–0.1)
Eosinophils Absolute: 0.4 10*3/uL (ref 0.0–0.7)
Eosinophils Relative: 5 %
HEMATOCRIT: 34.2 % — AB (ref 36.0–46.0)
HEMOGLOBIN: 11.1 g/dL — AB (ref 12.0–15.0)
LYMPHS ABS: 2.9 10*3/uL (ref 0.7–4.0)
LYMPHS PCT: 33 %
MCH: 29.4 pg (ref 26.0–34.0)
MCHC: 32.5 g/dL (ref 30.0–36.0)
MCV: 90.5 fL (ref 78.0–100.0)
MONO ABS: 0.7 10*3/uL (ref 0.1–1.0)
MONOS PCT: 7 %
NEUTROS ABS: 4.8 10*3/uL (ref 1.7–7.7)
Neutrophils Relative %: 54 %
Platelets: 365 10*3/uL (ref 150–400)
RBC: 3.78 MIL/uL — ABNORMAL LOW (ref 3.87–5.11)
RDW: 13.6 % (ref 11.5–15.5)
WBC: 8.9 10*3/uL (ref 4.0–10.5)

## 2017-05-22 MED ORDER — TRAMADOL HCL 50 MG PO TABS
50.0000 mg | ORAL_TABLET | Freq: Once | ORAL | Status: AC
  Administered 2017-05-22: 50 mg via ORAL
  Filled 2017-05-22: qty 1

## 2017-05-22 NOTE — Progress Notes (Signed)
PROGRESS NOTE    Jenna Hill  WUJ:[id number] DOB: [birth year] DOA: 05/20/2017 PCP: Alfonse Spruce, FNP   Brief Narrative: Patient is a 47 year old female with past medical history significant for bipolar disorder, schizophrenia, hypertension, history of overdose/suicide attempts who was brought to the emergency department after she was found to be sedated and responsive only to pain by her roommate .Benzos were positive on UDS.  Patient denies any intention of self-harm. She was recently admitted after was found unresponsive on 05 April 2017 with  suspected suicide attempt and  requiring intubation .Hospital course was complicated by aspiration pneumonia and rhabdomyolysis requiring brief course of hemodialysis and she was able to come off of hemodialysis .  Patient was admitted for lethargy.  Psychiatric consulted and recommended inpatient psych admission.   Assessment & Plan:   Principal Problem:   Schizoaffective disorder, depressive type ([place]) Active Problems:   Hypertension   Acute encephalopathy   Bipolar 1 disorder (HCC)   Sinus tachycardia   Overdose   Chronic diastolic CHF (congestive heart failure) (HCC)  Schizoaffective disorder/bipolar disorder: Psychiatry following.  She had history of drug overdose and suicide attempt in the past.  Psychiatry recommends inpatient psychiatric admission.  Social worker consulted. Patient denies any drug overdose at this admission.  Benzodiazepine was positive in UDS. Psychiatry recommends one-to-one sitter for safety.  Restarted Clozaril. Recommended to pursue involuntary commitment if patient refuses voluntary psychiatric hospitalization or attempts to leave the hospital.She cannot leave AMA. Social worker consulted for assist on transferring to psych inpatient.  Acute encephalopathy: Resolved.  Currently she is alert and oriented x4.  Sinus tachycardia: Much improved this morning .  We will continue to monitor.Metoprolol  restarted.  Hypertension: We will resume her home meds.  We will continue to monitor blood pressure.  Chronic diastolic CHF: Currently stable.    Right lower extremity edema:Found to have swelling around the ankle and foot on the right side.  X-ray was not suggestive of any fracture dislocation.  US Doppler ordered, prelim is negative for DVT.  Continue supportive care.  Patient is medically stable to be discharged to inpatient psych as soon as the bed is available.   DVT prophylaxis: Lovenox Code Status: Full Family Communication: None present at the bedside Disposition Plan: Inpatient psych as soon as bed is available   Consultants: Psychiatry  Procedures:None  Antimicrobials:None  Subjective: Patient seen and examined the bedside this morning.  Remains alert and oriented.  Comfortable.  She says she does not want to be admitted under psych inpatient.  I explained her about the importance of that at this time.  Objective: Vitals:   05/22/17 0137 05/22/17 0548 05/22/17 0956 05/22/17 1402  BP: 128/90 118/80 (!) 131/96 (!) 127/92  Pulse: 96 96 (!) 111 97  Resp: 18 20 18 16   Temp: 97.6 F (36.4 C) 99.1 F (37.3 C) 98.2 F (36.8 C) 97.9 F (36.6 C)  TempSrc: Oral Oral Oral Oral  SpO2: 100% 100% 100% 100%  Weight:      Height:        Intake/Output Summary (Last 24 hours) at 05/22/2017 1435 Last data filed at 05/22/2017 9562 Gross per 24 hour  Intake 240 ml  Output 600 ml  Net -360 ml   Filed Weights   05/20/17 2308  Weight: 104.9 kg (231 lb 4.2 oz)    Examination:  General exam: Appears calm and comfortable ,Not in distress,average built HEENT:PERRL,Oral mucosa moist, Ear/Nose normal on gross exam Respiratory system: Bilateral equal air  entry, normal vesicular breath sounds, no wheezes or crackles  Cardiovascular system: S1 & S2 heard, RRR. No JVD, murmurs, rubs, gallops or clicks. Gastrointestinal system: Abdomen is nondistended, soft and nontender. No  organomegaly or masses felt. Normal bowel sounds heard. Central nervous system: Alert and oriented. No focal neurological deficits. Extremities:No clubbing ,no cyanosis, distal peripheral pulses palpable.  Mild edema of the right foot. Skin: No rashes, lesions or ulcers,no icterus ,no pallor MSK: Normal muscle bulk,tone ,power Psychiatry: Judgement and insight appear normal. Mood & affect appropriate.     Data Reviewed: I have personally reviewed following labs and imaging studies  CBC: Recent Labs  Lab 05/20/17 0930 05/21/17 0432 05/22/17 0513  WBC 14.1* 13.7* 8.9  NEUTROABS 10.7*  --  4.8  HGB 12.4 10.5* 11.1*  HCT 38.1 32.5* 34.2*  MCV 90.7 90.3 90.5  PLT 410* 377 329   Basic Metabolic Panel: Recent Labs  Lab 05/20/17 0931 05/21/17 0432  NA 139 142  K 3.2* 3.7  CL 106 111  CO2 19* 21*  GLUCOSE 169* 93  BUN 14 11  CREATININE 1.35* 0.93  CALCIUM 9.4 8.4*  MG 1.3* 1.7  PHOS  --  3.7   GFR: Estimated Creatinine Clearance: 87.6 mL/min (by C-G formula based on SCr of 0.93 mg/dL). Liver Function Tests: Recent Labs  Lab 05/20/17 0931 05/21/17 0432  AST 25 18  ALT 25 19  ALKPHOS 63 52  BILITOT 0.6 0.6  PROT 7.7 6.5  ALBUMIN 3.7 3.0*   No results for input(s): LIPASE, AMYLASE in the last 168 hours. No results for input(s): AMMONIA in the last 168 hours. Coagulation Profile: No results for input(s): INR, PROTIME in the last 168 hours. Cardiac Enzymes: Recent Labs  Lab 05/20/17 0931  CKTOTAL 253*   BNP (last 3 results) No results for input(s): PROBNP in the last 8760 hours. HbA1C: No results for input(s): HGBA1C in the last 72 hours. CBG: Recent Labs  Lab 05/20/17 0936  GLUCAP 132*   Lipid Profile: No results for input(s): CHOL, HDL, LDLCALC, TRIG, CHOLHDL, LDLDIRECT in the last 72 hours. Thyroid Function Tests: Recent Labs    05/20/17 0931  TSH 1.012  FREET4 0.90   Anemia Panel: No results for input(s): VITAMINB12, FOLATE, FERRITIN, TIBC,  IRON, RETICCTPCT in the last 72 hours. Sepsis Labs: No results for input(s): PROCALCITON, LATICACIDVEN in the last 168 hours.  No results found for this or any previous visit (from the past 240 hour(s)).       Radiology Studies: Dg Ankle Complete Right  Result Date: 05/21/2017 CLINICAL DATA:  Onset of right ankle swelling today. No known injury. EXAM: RIGHT ANKLE - COMPLETE 3+ VIEW COMPARISON:  Plain films right ankle 04/05/2015. FINDINGS: No acute bony or joint abnormality is seen. No evidence of arthropathy. Small focus of ossification of the distal interosseous membrane is unchanged and may be due to old trauma. No tibiotalar joint effusion. Soft tissues appear somewhat swollen. IMPRESSION: Mild appearing soft tissue swelling. No acute or focal bony abnormality. Electronically Signed   By: Inge Rise M.D.   On: 05/21/2017 12:03        Scheduled Meds: . clozapine  400 mg Oral QHS  . enoxaparin (LOVENOX) injection  40 mg Subcutaneous q morning - 51O  . folic acid  1 mg Oral Daily  . metoprolol tartrate  100 mg Oral BID  . pantoprazole  40 mg Oral BID AC  . thiamine  100 mg Oral Daily   Continuous Infusions:  LOS: 1 day    Time spent:25. More than 50% of that time was spent in counseling and/or coordination of care.      Shelly Coss, MD [facility] Pager [phone]  If 7PM-7AM, please contact night-coverage [email or web address] Password [email or web address] 05/22/2017, 2:35 PM

## 2017-05-22 NOTE — Social Work (Addendum)
CSW contacted Mendel Ryder [place], [phone] to discuss inpatient psych referral. Mendel Ryder will review case and f/u with CSW.  Pt is not IVC and would need to consent voluntarily for inpatient psych referral.   CSW will f/u.  1:15pm: Mendel Ryder, [facility] advised that they do not have a bed for patient for today and has added her to the list. CSW will f/u tomorrow for bed availability and placement.   Elissa Hefty, [place] Clinical Social Worker-Weekend [place] [phone]

## 2017-05-22 NOTE — Plan of Care (Signed)
  Problem: Education: Goal: Knowledge of General Education information will improve Outcome: Progressing   Problem: Clinical Measurements: Goal: Ability to maintain clinical measurements within normal limits will improve Outcome: Progressing Goal: Will remain free from infection Outcome: Progressing Goal: Diagnostic test results will improve Outcome: Progressing Goal: Respiratory complications will improve Outcome: Progressing Goal: Cardiovascular complication will be avoided Outcome: Progressing   Problem: Pain Managment: Goal: General experience of comfort will improve Outcome: Progressing   Problem: Safety: Goal: Ability to remain free from injury will improve Outcome: Progressing

## 2017-05-23 ENCOUNTER — Encounter (HOSPITAL_COMMUNITY): Payer: Self-pay

## 2017-05-23 DIAGNOSIS — G9340 Encephalopathy, unspecified: Secondary | ICD-10-CM

## 2017-05-23 LAB — URIC ACID: URIC ACID, SERUM: 6.3 mg/dL (ref 2.3–6.6)

## 2017-05-23 NOTE — Progress Notes (Signed)
Patient is asking for Seroquel. Will notify the PCP.

## 2017-05-23 NOTE — Clinical Social Work Note (Signed)
Clinical Social Work Assessment  Patient Details  Name: Jenna Hill MRN: [id number] Date of Birth: 1971/01/02  Date of referral:  05/23/17               Reason for consult:  Facility Placement                Permission sought to share information with:    Permission granted to share information::  No  Name::        Agency::     Relationship::     Contact Information:     Housing/Transportation Living arrangements for the past 2 months:  Apartment Source of Information:  Patient Patient Interpreter Needed:  None Criminal Activity/Legal Involvement Pertinent to Current Situation/Hospitalization:  No - Comment as needed Significant Relationships:  None Lives with:  Self Do you feel safe going back to the place where you live?  Yes Need for family participation in patient care:  Yes (Comment)  Care giving concerns:  No care giving concerns at the time of assessment.    Social Worker assessment / plan:  LCSW consulted for inpatient psych placement.   LCSW met at bedside with patient. No family present.   LCSW explained role and reason for visit.   Patient states that she is her due to leg pain and that she cannot walk. Per H&P patient came in unresponsive and suspected suicide attempt. Patient previously admitted for SI attempt on 04/05/17. PT saw patient and per note patient was ambulating to the bathroom when they came to evaluate.   Patient reports that she has been inpatient about a year ago. She reports that she signed herself in and had a bad experience.   Psych recommending patient go to inpatient psych facility when medically clear. Patient denies any SI and is not willing to sign herself in. Psych recs IVC is patient refuses.  LCSW explained the IVC process. Patient expressed understanding.   LCSW started IVC paperwork.   PLAN: Patient will go to inpatient psych when medically cleared.    Employment status:  Unemployed Forensic scientist:  Medicaid [place]  Recommendations:  Outpatient Therapies Information / Referral to community resources:     Patient/Family's Response to care:  Patient thanked LCSW for visit.   Patient/Family's Understanding of and Emotional Response to Diagnosis, Current Treatment, and Prognosis:  Patient complained of leg pain and not being able to walk. Patient stated that she understands IVC process but she wasn't going any where because she can's walk.   Emotional Assessment Appearance:  Appears stated age Attitude/Demeanor/Rapport:  Unable to Assess Affect (typically observed):  In denial Orientation:  Oriented to Self, Oriented to Place, Oriented to  Time, Oriented to Situation Alcohol / Substance use:  Illicit Drugs Psych involvement (Current and /or in the community):  Outpatient Provider  Discharge Needs  Concerns to be addressed:  No discharge needs identified Readmission within the last 30 days:  No Current discharge risk:  None Barriers to Discharge:  Continued Medical Work up   [employer], LCSW 05/23/2017, 11:13 AM

## 2017-05-23 NOTE — Progress Notes (Signed)
IVC paperwork started. Awaiting patient to be served.    LCSW will continue to follow for disposition.   Carolin Coy [place]

## 2017-05-23 NOTE — Progress Notes (Addendum)
PROGRESS NOTE    Jenna Hill  AYT:[id number] DOB: 13-Jun-1970 DOA: 05/20/2017 PCP: Alfonse Spruce, FNP   Brief Narrative: Patient is a 47 year old female with past medical history significant for bipolar disorder, schizophrenia, hypertension, history of overdose/suicide attempts who was brought to the emergency department after she was found to be sedated and responsive only to pain by her roommate .Benzos were positive on UDS.  Patient denies any intention of self-harm. She was recently admitted after was found unresponsive on 05 April 2017 with  suspected suicide attempt and  requiring intubation .Hospital course was complicated by aspiration pneumonia and rhabdomyolysis requiring brief course of hemodialysis and she was able to come off of hemodialysis .  Patient was admitted for lethargy.  Psychiatric consulted and recommended inpatient psych admission.   Assessment & Plan:   Principal Problem:   Schizoaffective disorder, depressive type ([place]) Active Problems:   Hypertension   Acute encephalopathy   Bipolar 1 disorder (HCC)   Sinus tachycardia   Overdose   Chronic diastolic CHF (congestive heart failure) (HCC)  Schizoaffective disorder/bipolar disorder: Psychiatry following.  She had history of drug overdose and suicide attempt in the past.  Psychiatry recommends inpatient psychiatric admission.  Social worker consulted. Patient denies any drug overdose at this admission.  Benzodiazepine was positive in UDS. Psychiatry recommends one-to-one sitter for safety.patient currently has a Oncologist in the  room  Restarted Clozaril. Now under  involuntary commitment since patient refuses voluntary psychiatric hospitalization or attempts to leave the hospital.She cannot leave AMA. Social worker consulted for assist on transferring to psych inpatient. IVC placed 4/22  Acute encephalopathy: Resolved.  Currently she is alert and oriented x4.  Sinus tachycardia: Much improved this  morning .  We will continue to monitor.Metoprolol restarted.  Hypertension: We will resume her home meds.  We will continue to monitor blood pressure.  Chronic diastolic CHF: Currently stable.    Right lower extremity edema:Found to have swelling around the ankle and foot on the right side.  X-ray was not suggestive of any fracture dislocation.  US Doppler  , prelim is negative for DVT.  Continue supportive care.patient states that she has a right foot drop, even though she ambulated to the bathroom with a walker. She states that she has pain radiating down from her right buttock to her right calf area. She is agreeable to do an MRI of the lumbar and sacral spine which is been ordered and pending     DVT prophylaxis: Lovenox Code Status: Full Family Communication: discussed with mother   [phone] and updated her about pending MRI   Disposition Plan: Inpatient psych  4/23 if MRI is negative  Consultants: Psychiatry  Procedures:None  Antimicrobials:None  Subjective: Patient states that she is hurting, right leg has a foot drop, she is dragging her foot. She has pain radiating down her right leg  Objective: Vitals:   05/22/17 0956 05/22/17 1402 05/22/17 2033 05/23/17 0431  BP: (!) 131/96 (!) 127/92 127/90 116/77  Pulse: (!) 111 97 (!) 109 100  Resp: 18 16 18 18   Temp: 98.2 F (36.8 C) 97.9 F (36.6 C) 98.5 F (36.9 C) 98.5 F (36.9 C)  TempSrc: Oral Oral Oral Oral  SpO2: 100% 100% 98% 100%  Weight:    101.7 kg (224 lb 3.3 oz)  Height:        Intake/Output Summary (Last 24 hours) at 05/23/2017 1107 Last data filed at 05/22/2017 1853 Gross per 24 hour  Intake 240 ml  Output  200 ml  Net 40 ml   Filed Weights   05/20/17 2308 05/23/17 0431  Weight: 104.9 kg (231 lb 4.2 oz) 101.7 kg (224 lb 3.3 oz)    Examination:  General exam: Appears calm and comfortable ,Not in distress,average built HEENT:PERRL,Oral mucosa moist, Ear/Nose normal on gross exam Respiratory  system: Bilateral equal air entry, normal vesicular breath sounds, no wheezes or crackles  Cardiovascular system: S1 & S2 heard, RRR. No JVD, murmurs, rubs, gallops or clicks. Gastrointestinal system: Abdomen is nondistended, soft and nontender. No organomegaly or masses felt. Normal bowel sounds heard. Central nervous system: Alert and oriented. No focal neurological deficits. Extremities:No clubbing ,no cyanosis, distal peripheral pulses palpable.  Mild edema of the right foot.muscle strength intact, reflexes intact in bilateral lower extremities     Data Reviewed: I have personally reviewed following labs and imaging studies  CBC: Recent Labs  Lab 05/20/17 0930 05/21/17 0432 05/22/17 0513  WBC 14.1* 13.7* 8.9  NEUTROABS 10.7*  --  4.8  HGB 12.4 10.5* 11.1*  HCT 38.1 32.5* 34.2*  MCV 90.7 90.3 90.5  PLT 410* 377 539   Basic Metabolic Panel: Recent Labs  Lab 05/20/17 0931 05/21/17 0432  NA 139 142  K 3.2* 3.7  CL 106 111  CO2 19* 21*  GLUCOSE 169* 93  BUN 14 11  CREATININE 1.35* 0.93  CALCIUM 9.4 8.4*  MG 1.3* 1.7  PHOS  --  3.7   GFR: Estimated Creatinine Clearance: 86 mL/min (by C-G formula based on SCr of 0.93 mg/dL). Liver Function Tests: Recent Labs  Lab 05/20/17 0931 05/21/17 0432  AST 25 18  ALT 25 19  ALKPHOS 63 52  BILITOT 0.6 0.6  PROT 7.7 6.5  ALBUMIN 3.7 3.0*   No results for input(s): LIPASE, AMYLASE in the last 168 hours. No results for input(s): AMMONIA in the last 168 hours. Coagulation Profile: No results for input(s): INR, PROTIME in the last 168 hours. Cardiac Enzymes: Recent Labs  Lab 05/20/17 0931  CKTOTAL 253*   BNP (last 3 results) No results for input(s): PROBNP in the last 8760 hours. HbA1C: No results for input(s): HGBA1C in the last 72 hours. CBG: Recent Labs  Lab 05/20/17 0936  GLUCAP 132*   Lipid Profile: No results for input(s): CHOL, HDL, LDLCALC, TRIG, CHOLHDL, LDLDIRECT in the last 72 hours. Thyroid Function  Tests: No results for input(s): TSH, T4TOTAL, FREET4, T3FREE, THYROIDAB in the last 72 hours. Anemia Panel: No results for input(s): VITAMINB12, FOLATE, FERRITIN, TIBC, IRON, RETICCTPCT in the last 72 hours. Sepsis Labs: No results for input(s): PROCALCITON, LATICACIDVEN in the last 168 hours.  No results found for this or any previous visit (from the past 240 hour(s)).       Radiology Studies: No results found.      Scheduled Meds: . clozapine  400 mg Oral QHS  . enoxaparin (LOVENOX) injection  40 mg Subcutaneous q morning - 76B  . folic acid  1 mg Oral Daily  . metoprolol tartrate  100 mg Oral BID  . pantoprazole  40 mg Oral BID AC  . thiamine  100 mg Oral Daily   Continuous Infusions:   LOS: 2 days    Time spent:25. More than 50% of that time was spent in counseling and/or coordination of care.      Reyne Dumas, MD [facility]    If 7PM-7AM, please contact night-coverage [email or web address] Password [email or web address] 05/23/2017, 11:07 AM

## 2017-05-23 NOTE — BH Assessment (Signed)
Pt accepted to [facility] - Adult unit. Accepting MD - Dr. Darleene Cleaver. Attending MD - Dr. Nancy Fetter. Patient is assigned to room 501-1.  Patient is currently a voluntary patient, and must sign Voluntary Consent for Treatment.( If pt refuses to sign IVC paperwork must be initiated and received before patient can be transferred to [facility]. ) This is to be faxed to [phone]. Number for nurse to nurse report is - [phone]. Report must be called before the patient is transported to [facility].  Informed SW Bernette patient is accepted. Bed is available now.

## 2017-05-24 ENCOUNTER — Inpatient Hospital Stay (HOSPITAL_COMMUNITY): Payer: Medicaid Other

## 2017-05-24 ENCOUNTER — Other Ambulatory Visit: Payer: Self-pay

## 2017-05-24 ENCOUNTER — Encounter (HOSPITAL_COMMUNITY): Payer: Self-pay

## 2017-05-24 ENCOUNTER — Inpatient Hospital Stay (HOSPITAL_COMMUNITY)
Admission: AD | Admit: 2017-05-24 | Discharge: 2017-05-31 | DRG: 885 | Disposition: A | Discharge status: Home / Self Care | Payer: Medicaid Other | Source: Intra-hospital | Attending: Emergency Medicine | Admitting: Emergency Medicine

## 2017-05-24 DIAGNOSIS — Z91410 Personal history of adult physical and sexual abuse: Secondary | ICD-10-CM | POA: Diagnosis not present

## 2017-05-24 DIAGNOSIS — R451 Restlessness and agitation: Secondary | ICD-10-CM | POA: Diagnosis present

## 2017-05-24 DIAGNOSIS — Z818 Family history of other mental and behavioral disorders: Secondary | ICD-10-CM | POA: Diagnosis not present

## 2017-05-24 DIAGNOSIS — M21371 Foot drop, right foot: Secondary | ICD-10-CM | POA: Diagnosis present

## 2017-05-24 DIAGNOSIS — F250 Schizoaffective disorder, bipolar type: Secondary | ICD-10-CM | POA: Diagnosis present

## 2017-05-24 DIAGNOSIS — E042 Nontoxic multinodular goiter: Secondary | ICD-10-CM | POA: Diagnosis present

## 2017-05-24 DIAGNOSIS — F419 Anxiety disorder, unspecified: Secondary | ICD-10-CM | POA: Diagnosis present

## 2017-05-24 DIAGNOSIS — R000 Tachycardia, unspecified: Secondary | ICD-10-CM | POA: Diagnosis not present

## 2017-05-24 DIAGNOSIS — Z62810 Personal history of physical and sexual abuse in childhood: Secondary | ICD-10-CM | POA: Diagnosis not present

## 2017-05-24 DIAGNOSIS — Z915 Personal history of self-harm: Secondary | ICD-10-CM

## 2017-05-24 DIAGNOSIS — F251 Schizoaffective disorder, depressive type: Secondary | ICD-10-CM | POA: Diagnosis not present

## 2017-05-24 DIAGNOSIS — I4581 Long QT syndrome: Secondary | ICD-10-CM | POA: Diagnosis present

## 2017-05-24 DIAGNOSIS — J302 Other seasonal allergic rhinitis: Secondary | ICD-10-CM | POA: Diagnosis present

## 2017-05-24 DIAGNOSIS — G4700 Insomnia, unspecified: Secondary | ICD-10-CM | POA: Diagnosis present

## 2017-05-24 DIAGNOSIS — E876 Hypokalemia: Secondary | ICD-10-CM | POA: Diagnosis present

## 2017-05-24 DIAGNOSIS — I5032 Chronic diastolic (congestive) heart failure: Secondary | ICD-10-CM | POA: Diagnosis present

## 2017-05-24 DIAGNOSIS — W010XXA Fall on same level from slipping, tripping and stumbling without subsequent striking against object, initial encounter: Secondary | ICD-10-CM

## 2017-05-24 DIAGNOSIS — I110 Hypertensive heart disease with heart failure: Secondary | ICD-10-CM | POA: Diagnosis present

## 2017-05-24 DIAGNOSIS — M79604 Pain in right leg: Secondary | ICD-10-CM | POA: Diagnosis not present

## 2017-05-24 DIAGNOSIS — I10 Essential (primary) hypertension: Secondary | ICD-10-CM | POA: Diagnosis not present

## 2017-05-24 DIAGNOSIS — Z9181 History of falling: Secondary | ICD-10-CM | POA: Diagnosis not present

## 2017-05-24 DIAGNOSIS — F319 Bipolar disorder, unspecified: Secondary | ICD-10-CM | POA: Diagnosis not present

## 2017-05-24 DIAGNOSIS — G9340 Encephalopathy, unspecified: Secondary | ICD-10-CM | POA: Diagnosis not present

## 2017-05-24 DIAGNOSIS — T50901A Poisoning by unspecified drugs, medicaments and biological substances, accidental (unintentional), initial encounter: Secondary | ICD-10-CM | POA: Diagnosis not present

## 2017-05-24 DIAGNOSIS — F429 Obsessive-compulsive disorder, unspecified: Secondary | ICD-10-CM | POA: Diagnosis not present

## 2017-05-24 MED ORDER — HYDROXYZINE HCL 25 MG PO TABS
25.0000 mg | ORAL_TABLET | Freq: Three times a day (TID) | ORAL | Status: DC | PRN
  Administered 2017-05-24: 25 mg via ORAL
  Filled 2017-05-24: qty 1

## 2017-05-24 MED ORDER — CLOZAPINE 25 MG PO TABS
50.0000 mg | ORAL_TABLET | Freq: Every day | ORAL | Status: DC
  Filled 2017-05-24: qty 2

## 2017-05-24 MED ORDER — TRAZODONE HCL 50 MG PO TABS
50.0000 mg | ORAL_TABLET | Freq: Every evening | ORAL | Status: DC | PRN
  Administered 2017-05-24: 50 mg via ORAL
  Filled 2017-05-24: qty 1

## 2017-05-24 MED ORDER — HALOPERIDOL 5 MG PO TABS
5.0000 mg | ORAL_TABLET | Freq: Four times a day (QID) | ORAL | Status: DC | PRN
  Administered 2017-05-28 – 2017-05-30 (×3): 5 mg via ORAL
  Filled 2017-05-24 (×2): qty 1

## 2017-05-24 MED ORDER — HALOPERIDOL LACTATE 5 MG/ML IJ SOLN: 5.0000 mg | Freq: Four times a day (QID) | INTRAMUSCULAR | Status: DC | PRN

## 2017-05-24 MED ORDER — ALUM & MAG HYDROXIDE-SIMETH 200-200-20 MG/5ML PO SUSP
30.0000 mL | ORAL | Status: DC | PRN
Start: 2017-05-24 — End: 2017-05-31

## 2017-05-24 MED ORDER — MAGNESIUM HYDROXIDE 400 MG/5ML PO SUSP: 30.0000 mL | Freq: Every day | ORAL | Status: DC | PRN

## 2017-05-24 NOTE — Progress Notes (Signed)
MRI will come to transport the patient in about 40 minutes. The sitter will travel with the patient.

## 2017-05-24 NOTE — Progress Notes (Signed)
Patient will go to MRI with the sitter, Monitor to be removed.

## 2017-05-24 NOTE — Progress Notes (Signed)
Jenna Hill is a 47 year old female being admitted voluntarily to 65-2 from [facility] floor.  She was admitted medically after being found unresponsive by her friend.  She had recent hospitalization in March with a suspected overdose which caused multiple medical issues.  During [facility] admission, she reported that this was not a suicide attempt but due to a recent medication change by her psychiatrist.  She stated that she was recently started on Abilify 15mg  qd and she feels that is what caused this episode.  Since the hospitalization in March, she now has foot drop and has been unable to walk without walker and utilizes a wheelchair at home as well.  She was noted to be very unsteady and needing to have assistance in standing.  She denies any SI/HI or visual hallucinations.  She reports that "I always hear voices, they just talk to me."  She was noted talking to herself at times under her breath.  She denies that the voices tell her to harm herself or anyone else.  Oriented her to the unit.  Admission paperwork completed and signed.  Belongings searched and secured in locker # 34, no contraband found.  Skin assessment completed and no skin issues noted.  Q 15 minute checks initiated for safety.  We will continue to monitor the progress towards her goals.

## 2017-05-24 NOTE — Discharge Summary (Signed)
Physician Discharge Summary  Jenna Hill MWN:[id number] DOB: 07/28/1970 DOA: 05/20/2017  PCP: Alfonse Spruce, FNP  Admit date: 05/20/2017 Discharge date: 05/24/2017  Admitted From: home Disposition:  Home  Recommendations for Outpatient Follow-up:  1. Transferred to inpatient psychiatric facility  Home Health:No Equipment/Devices:none  Discharge Condition:stable CODE STATUS:full Diet recommendation:Regular   Brief/Interim Summary: 47 year old female with past medical history of bipolar disorder and schizophrenia history of overdose and suicide attempts who was brought in the ED after she was found sedated and unresponsive to pain by her roommate  Discharge Diagnoses:  Principal Problem:   Schizoaffective disorder, depressive type ([place]) Active Problems:   Hypertension   Acute encephalopathy   Bipolar 1 disorder ([place])   Sinus tachycardia   Overdose   Chronic diastolic CHF (congestive heart failure) ([place])  Schizo affective disorder/bipolar disorder: She was monitored on telemetry with no events psychiatry was consulted and there was a concern of suicide attempt due to her history. Psychiatry recommended inpatient psychiatric admission and heat she was started on Clozaril. The patient had to be involuntarily committed that she refused to go to an inpatient psych facility. IVC placed on 05/23/2017.  Acute encephalopathy: Resolved likely due to medication overdose.  Sinus tachycardia: Improve.  Essential hypertension: Resume home meds.  Chronic diastolic heart failure: No changes were made to her medication.  Right lower extremity pain: The patient relate a fall and MRI of the lumbar spine and buttocks were done that showed a hematoma she had no white count no leukocytosis or fever and there is a history of falls with his most likely hematoma.   Discharge Instructions  Discharge Instructions    Diet - low sodium heart healthy   Complete by:  As directed     Increase activity slowly   Complete by:  As directed      Allergies as of 05/24/2017      Reactions   Bee Venom Anaphylaxis   Codeine Swelling   Facial swelling   Other Anaphylaxis   bertrillium or bertillium     Toradol [ketorolac Tromethamine] Swelling   Tongue swelling   Lortab [hydrocodone-acetaminophen] Hives   Percocet [oxycodone-acetaminophen] Hives   Ibuprofen    Raised hives Also with naproxen   Latex Rash      Medication List    STOP taking these medications   ABILIFY MAINTENA 400 MG Prsy prefilled syringe Generic drug:  ARIPiprazole ER   fentaNYL 25 MCG/HR patch Commonly known as:  DURAGESIC - dosed mcg/hr   QUEtiapine 400 MG 24 hr tablet Commonly known as:  SEROQUEL XR     TAKE these medications   acetaminophen 325 MG tablet Commonly known as:  TYLENOL Take 2 tablets (650 mg total) by mouth every 6 (six) hours as needed for moderate pain or headache.   amLODipine 10 MG tablet Commonly known as:  NORVASC Take 1 tablet (10 mg total) by mouth daily with supper.   clozapine 200 MG tablet Commonly known as:  CLOZARIL Take 400 mg by mouth at bedtime.   doxepin 50 MG capsule Commonly known as:  SINEQUAN Take 1 capsule (50 mg total) by mouth at bedtime.   Eszopiclone 3 MG Tabs Take 3 mg by mouth at bedtime.   gabapentin 300 MG capsule Commonly known as:  NEURONTIN Take 1 capsule (300 mg total) by mouth at bedtime.   loratadine 10 MG tablet Commonly known as:  ALLERGY RELIEF Take 1 tablet (10 mg total) by mouth daily as needed for allergies.  metoprolol tartrate 100 MG tablet Commonly known as:  LOPRESSOR Take 1 tablet (100 mg total) by mouth 2 (two) times daily.   pantoprazole 40 MG tablet Commonly known as:  PROTONIX Take 1 tablet (40 mg total) by mouth 2 (two) times daily before a meal.   potassium chloride SA 20 MEQ tablet Commonly known as:  K-DUR,KLOR-CON Take 1 tablet (20 mEq total) by mouth daily.   pregabalin 50 MG  capsule Commonly known as:  LYRICA Take 1 capsule (50 mg total) by mouth 3 (three) times daily.   traMADol 50 MG tablet Commonly known as:  ULTRAM Take 2 tablets (100 mg total) by mouth every 12 (twelve) hours as needed for severe pain (breakthrough).   TRINTELLIX 20 MG Tabs tablet Generic drug:  vortioxetine HBr Take 20 mg by mouth daily. With food      Follow-up Information    Schedule an appointment as soon as possible for a visit  with Alfonse Spruce, FNP.   Specialty:  Family Medicine Contact information: [address] [place] [address] [phone]          Allergies  Allergen Reactions  . Bee Venom Anaphylaxis  . Codeine Swelling    Facial swelling  . Other Anaphylaxis    bertrillium or bertillium    . Toradol [Ketorolac Tromethamine] Swelling    Tongue swelling  . Lortab [Hydrocodone-Acetaminophen] Hives  . Percocet [Oxycodone-Acetaminophen] Hives  . Ibuprofen     Raised hives Also with naproxen  . Latex Rash    Consultations:  Psyq   Procedures/Studies: Dg Ankle Complete Right  Result Date: 05/21/2017 CLINICAL DATA:  Onset of right ankle swelling today. No known injury. EXAM: RIGHT ANKLE - COMPLETE 3+ VIEW COMPARISON:  Plain films right ankle 04/05/2015. FINDINGS: No acute bony or joint abnormality is seen. No evidence of arthropathy. Small focus of ossification of the distal interosseous membrane is unchanged and may be due to old trauma. No tibiotalar joint effusion. Soft tissues appear somewhat swollen. IMPRESSION: Mild appearing soft tissue swelling. No acute or focal bony abnormality. Electronically Signed   By: Inge Rise M.D.   On: 05/21/2017 12:03   Mr Lumbar Spine Wo Contrast  Result Date: 05/24/2017 CLINICAL DATA:  Back pain right buttock and leg pain. Recent history of acute renal failure with rhabdomyolysis. Mild leukocytosis. EXAM: MRI LUMBAR SPINE AND SACRUM WITHOUT CONTRAST TECHNIQUE: Multiplanar, multisequence MR  imaging of the lumbar spine was performed. No intravenous contrast was administered. COMPARISON:  CT abdomen pelvis 04/13/2017 FINDINGS: Segmentation:  Normal Alignment:  Mild retrolisthesis L5-S1 Vertebrae:  Normal bone marrow.  Negative for fracture or mass. Conus medullaris and cauda equina: Conus extends to the L1-2 level. Conus and cauda equina appear normal. Paraspinal and other soft tissues: No retroperitoneal mass or adenopathy. Normal aorta Disc levels: Disc degeneration and diffuse endplate spurring O3-J0. Mild facet degeneration bilaterally with mild foraminal narrowing bilaterally. Remaining disc spaces show no significant degenerative change or stenosis. Sacrum bony sacrum is normal without fracture or mass.  No edema There is edema throughout the right buttock muscles involving the gluteus maximus and minimus muscles. No significant muscle atrophy. 10 x 15 mm small fluid collection right gluteus minimus muscle and 15 mm fluid collection gluteus maximus muscle on the right. No significant bone marrow edema. Left buttock musculature normal in signal and volume. SI joints normal bilaterally. Minimal free fluid in the pelvis. IMPRESSION: Disc degeneration and spurring L5-S1 without significant spinal stenosis. Mild foraminal narrowing bilaterally Normal  conus medullaris Diffuse edema throughout the right gluteus minimus and maximus muscles with small fluid collections. This is likely related to myositis and could be related to the patient's prior episode of rhabdomyolysis, or could be an acute process which occurred since the recent admission. Small fluid collections are present in the muscle likely representing hematoma or muscle necrosis however small abscess is possible. Electronically Signed   By: Franchot Gallo M.D.   On: 05/24/2017 08:14   Mr Sacrum Si Joints Wo Contrast  Result Date: 05/24/2017 CLINICAL DATA:  Back pain right buttock and leg pain. Recent history of acute renal failure with  rhabdomyolysis. Mild leukocytosis. EXAM: MRI LUMBAR SPINE AND SACRUM WITHOUT CONTRAST TECHNIQUE: Multiplanar, multisequence MR imaging of the lumbar spine was performed. No intravenous contrast was administered. COMPARISON:  CT abdomen pelvis 04/13/2017 FINDINGS: Segmentation:  Normal Alignment:  Mild retrolisthesis L5-S1 Vertebrae:  Normal bone marrow.  Negative for fracture or mass. Conus medullaris and cauda equina: Conus extends to the L1-2 level. Conus and cauda equina appear normal. Paraspinal and other soft tissues: No retroperitoneal mass or adenopathy. Normal aorta Disc levels: Disc degeneration and diffuse endplate spurring E3-P2. Mild facet degeneration bilaterally with mild foraminal narrowing bilaterally. Remaining disc spaces show no significant degenerative change or stenosis. Sacrum bony sacrum is normal without fracture or mass.  No edema There is edema throughout the right buttock muscles involving the gluteus maximus and minimus muscles. No significant muscle atrophy. 10 x 15 mm small fluid collection right gluteus minimus muscle and 15 mm fluid collection gluteus maximus muscle on the right. No significant bone marrow edema. Left buttock musculature normal in signal and volume. SI joints normal bilaterally. Minimal free fluid in the pelvis. IMPRESSION: Disc degeneration and spurring L5-S1 without significant spinal stenosis. Mild foraminal narrowing bilaterally Normal conus medullaris Diffuse edema throughout the right gluteus minimus and maximus muscles with small fluid collections. This is likely related to myositis and could be related to the patient's prior episode of rhabdomyolysis, or could be an acute process which occurred since the recent admission. Small fluid collections are present in the muscle likely representing hematoma or muscle necrosis however small abscess is possible. Electronically Signed   By: Franchot Gallo M.D.   On: 05/24/2017 08:14    Subjective: No  complaints. Discharge Exam: Vitals:   05/23/17 2150 05/24/17 0451  BP: (!) 151/97 (!) 143/97  Pulse: (!) 111 (!) 107  Resp: 18 18  Temp: 99.2 F (37.3 C) 99.9 F (37.7 C)  SpO2: 100% 99%   Vitals:   05/23/17 0431 05/23/17 1224 05/23/17 2150 05/24/17 0451  BP: 116/77 134/82 (!) 151/97 (!) 143/97  Pulse: 100 (!) 116 (!) 111 (!) 107  Resp: 18 19 18 18   Temp: 98.5 F (36.9 C) 99.7 F (37.6 C) 99.2 F (37.3 C) 99.9 F (37.7 C)  TempSrc: Oral Oral Oral Oral  SpO2: 100% 98% 100% 99%  Weight: 101.7 kg (224 lb 3.3 oz)     Height:        General: Pt is alert, awake, not in acute distress Cardiovascular: RRR, S1/S2 +, no rubs, no gallops Respiratory: CTA bilaterally, no wheezing, no rhonchi Abdominal: Soft, NT, ND, bowel sounds + Extremities: no edema, no cyanosis    The results of significant diagnostics from this hospitalization (including imaging, microbiology, ancillary and laboratory) are listed below for reference.     Microbiology: No results found for this or any previous visit (from the past 240 hour(s)).   Labs: BNP (  last 3 results) Recent Labs    04/05/17 1751  BNP 53.6   Basic Metabolic Panel: Recent Labs  Lab 05/20/17 0931 05/21/17 0432  NA 139 142  K 3.2* 3.7  CL 106 111  CO2 19* 21*  GLUCOSE 169* 93  BUN 14 11  CREATININE 1.35* 0.93  CALCIUM 9.4 8.4*  MG 1.3* 1.7  PHOS  --  3.7   Liver Function Tests: Recent Labs  Lab 05/20/17 0931 05/21/17 0432  AST 25 18  ALT 25 19  ALKPHOS 63 52  BILITOT 0.6 0.6  PROT 7.7 6.5  ALBUMIN 3.7 3.0*   No results for input(s): LIPASE, AMYLASE in the last 168 hours. No results for input(s): AMMONIA in the last 168 hours. CBC: Recent Labs  Lab 05/20/17 0930 05/21/17 0432 05/22/17 0513  WBC 14.1* 13.7* 8.9  NEUTROABS 10.7*  --  4.8  HGB 12.4 10.5* 11.1*  HCT 38.1 32.5* 34.2*  MCV 90.7 90.3 90.5  PLT 410* 377 365   Cardiac Enzymes: Recent Labs  Lab 05/20/17 0931  CKTOTAL 253*    BNP: Invalid input(s): POCBNP CBG: Recent Labs  Lab 05/20/17 0936  GLUCAP 132*   D-Dimer No results for input(s): DDIMER in the last 72 hours. Hgb A1c No results for input(s): HGBA1C in the last 72 hours. Lipid Profile No results for input(s): CHOL, HDL, LDLCALC, TRIG, CHOLHDL, LDLDIRECT in the last 72 hours. Thyroid function studies No results for input(s): TSH, T4TOTAL, T3FREE, THYROIDAB in the last 72 hours.  Invalid input(s): FREET3 Anemia work up No results for input(s): VITAMINB12, FOLATE, FERRITIN, TIBC, IRON, RETICCTPCT in the last 72 hours. Urinalysis    Component Value Date/Time   COLORURINE YELLOW 05/20/2017 1730   APPEARANCEUR CLEAR 05/20/2017 1730   LABSPEC 1.019 05/20/2017 1730   PHURINE 5.0 05/20/2017 1730   GLUCOSEU NEGATIVE 05/20/2017 1730   HGBUR SMALL (A) 05/20/2017 1730   BILIRUBINUR NEGATIVE 05/20/2017 1730   KETONESUR NEGATIVE 05/20/2017 1730   PROTEINUR 30 (A) 05/20/2017 1730   UROBILINOGEN 0.2 08/02/2008 0812   NITRITE NEGATIVE 05/20/2017 1730   LEUKOCYTESUR LARGE (A) 05/20/2017 1730   Sepsis Labs Invalid input(s): PROCALCITONIN,  WBC,  LACTICIDVEN Microbiology No results found for this or any previous visit (from the past 240 hour(s)).   Time coordinating discharge: 35 minutes  SIGNED:   Charlynne Cousins, MD  [facility] 05/24/2017, 10:21 AM Pager   If 7PM-7AM, please contact night-coverage [email or web address] Password [email or web address]

## 2017-05-24 NOTE — Tx Team (Addendum)
Initial Treatment Plan 05/24/2017 11:07 PM Charnise Lovan YIF:[id number]    PATIENT STRESSORS: Health problems Medication change or noncompliance   PATIENT STRENGTHS: Curator fund of knowledge Motivation for treatment/growth Supportive family/friends   PATIENT IDENTIFIED PROBLEMS: Depression  Suicidal  Psychosis  "Getting out of here and staying out of here"               DISCHARGE CRITERIA:  Improved stabilization in mood, thinking, and/or behavior Motivation to continue treatment in a less acute level of care Verbal commitment to aftercare and medication compliance  PRELIMINARY DISCHARGE PLAN: Outpatient therapy Medication management  PATIENT/FAMILY INVOLVEMENT: This treatment plan has been presented to and reviewed with the patient, Shailah Consoli.  The patient and family have been given the opportunity to ask questions and make suggestions.  Windell Moment, RN 05/24/2017, 11:07 PM

## 2017-05-25 ENCOUNTER — Encounter: Payer: Medicaid Other | Admitting: Physical Medicine & Rehabilitation

## 2017-05-25 DIAGNOSIS — F250 Schizoaffective disorder, bipolar type: Principal | ICD-10-CM

## 2017-05-25 DIAGNOSIS — Z62810 Personal history of physical and sexual abuse in childhood: Secondary | ICD-10-CM

## 2017-05-25 DIAGNOSIS — F419 Anxiety disorder, unspecified: Secondary | ICD-10-CM

## 2017-05-25 DIAGNOSIS — Z818 Family history of other mental and behavioral disorders: Secondary | ICD-10-CM

## 2017-05-25 DIAGNOSIS — F429 Obsessive-compulsive disorder, unspecified: Secondary | ICD-10-CM

## 2017-05-25 DIAGNOSIS — G4700 Insomnia, unspecified: Secondary | ICD-10-CM

## 2017-05-25 DIAGNOSIS — T50901A Poisoning by unspecified drugs, medicaments and biological substances, accidental (unintentional), initial encounter: Secondary | ICD-10-CM

## 2017-05-25 MED ORDER — CLOZAPINE 100 MG PO TABS
100.0000 mg | ORAL_TABLET | Freq: Every day | ORAL | Status: AC
  Administered 2017-05-26: 100 mg via ORAL
  Filled 2017-05-25: qty 1

## 2017-05-25 MED ORDER — TRAZODONE HCL 50 MG PO TABS
50.0000 mg | ORAL_TABLET | Freq: Every evening | ORAL | Status: DC | PRN
  Administered 2017-05-25: 50 mg via ORAL
  Filled 2017-05-25: qty 1

## 2017-05-25 MED ORDER — AMLODIPINE BESYLATE 10 MG PO TABS
10.0000 mg | ORAL_TABLET | Freq: Every day | ORAL | Status: DC
  Administered 2017-05-25 – 2017-05-30 (×6): 10 mg via ORAL
  Filled 2017-05-25: qty 2
  Filled 2017-05-25 (×8): qty 1

## 2017-05-25 MED ORDER — TRAMADOL HCL 50 MG PO TABS
50.0000 mg | ORAL_TABLET | Freq: Four times a day (QID) | ORAL | Status: DC | PRN
  Administered 2017-05-25: 50 mg via ORAL
  Filled 2017-05-25: qty 1

## 2017-05-25 MED ORDER — CLOZAPINE 100 MG PO TABS
200.0000 mg | ORAL_TABLET | Freq: Every day | ORAL | Status: DC
  Filled 2017-05-25: qty 2

## 2017-05-25 MED ORDER — ACETAMINOPHEN 325 MG PO TABS
650.0000 mg | ORAL_TABLET | Freq: Four times a day (QID) | ORAL | Status: DC | PRN
  Administered 2017-05-26 – 2017-05-31 (×6): 650 mg via ORAL
  Filled 2017-05-25 (×7): qty 2

## 2017-05-25 MED ORDER — CLOZAPINE 100 MG PO TABS
150.0000 mg | ORAL_TABLET | Freq: Every day | ORAL | Status: AC
  Administered 2017-05-27: 150 mg via ORAL
  Filled 2017-05-25: qty 2

## 2017-05-25 MED ORDER — HYDROXYZINE HCL 50 MG PO TABS
50.0000 mg | ORAL_TABLET | Freq: Four times a day (QID) | ORAL | Status: DC | PRN
  Administered 2017-05-25 – 2017-05-31 (×8): 50 mg via ORAL
  Filled 2017-05-25 (×8): qty 1

## 2017-05-25 MED ORDER — CLOZAPINE 25 MG PO TABS
50.0000 mg | ORAL_TABLET | Freq: Every day | ORAL | Status: AC
  Administered 2017-05-25: 50 mg via ORAL
  Filled 2017-05-25: qty 2

## 2017-05-25 NOTE — [facility] Group Notes (Signed)

## 2017-05-25 NOTE — Progress Notes (Signed)
At this time, pt is lying in bed with eyes closed with no distress observed.  Sitter reports that pt is restless at times and talking in her sleep.  Sitter is sitting near the pt's bed.  Continue close obs for patient's safety d/t pt's high fall risk.  Pt remains safe.

## 2017-05-25 NOTE — Progress Notes (Signed)
Patient being monitored on close observation due to unsteady gait and increased fall risk.  Patient has been able to maintain safety and remains free of falls. Patient's 1:1 sitter is in close proximity to patient. Continue to monitor as planned.

## 2017-05-25 NOTE — Progress Notes (Signed)
Psychoeducational Group Note  Date:  05/25/2017 Time:  2120  Group Topic/Focus:  Wrap-Up Group:   The focus of this group is to help patients review their daily goal of treatment and discuss progress on daily workbooks.  Participation Level: Did Not Attend  Participation Quality:  Not Applicable  Affect:  Not Applicable  Cognitive:  Not Applicable  Insight:  Not Applicable  Engagement in Group: Not Applicable  Additional Comments:  The patient did not attend group since she was in her room .   Archie Balboa S 05/25/2017, 9:20 PM

## 2017-05-25 NOTE — Progress Notes (Signed)
Nursing close observation D:Pt observed lying in bed with eyes closed, awake. RR even and unlabored. No distress noted. A: 1:1 observation continues for safety  R: pt remains safe

## 2017-05-25 NOTE — [facility] Suicide Risk Assessment (Signed)
[facility] Admission Suicide Risk Assessment   Nursing information obtained from:  Patient Demographic factors:  Living alone Current Mental Status:  NA Loss Factors:  NA Historical Factors:  NA Risk Reduction Factors:  NA  Total Time spent with patient: 1 hour Principal Problem: Schizoaffective disorder, bipolar type ([place]) Diagnosis:   Patient Active Problem List   Diagnosis Date Noted  . Sinus tachycardia [R00.0] 05/20/2017  . Overdose [T50.901A] 05/20/2017  . Chronic diastolic CHF (congestive heart failure) ([place]) [I50.32] 05/20/2017  . Injury of right sciatic nerve [S74.01XA] 04/29/2017  . AKI (acute kidney injury) ([place]) [N17.9] 04/25/2017  . Shock liver [K72.00] 04/10/2017  . Rhabdomyolysis [M62.82] 04/10/2017  . Bipolar 1 disorder ([place]) [F31.9] 04/10/2017  . Acute respiratory failure ([place]) [J96.00]   . Acute encephalopathy [G93.40] 04/05/2017  . 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 OSA (obstructive sleep apnea) [G47.33] 04/16/2015  . Obesity [E66.9] 04/16/2015  . Schizoaffective disorder, bipolar type ([place]) [F25.0] 04/15/2015  . Hypertension [I10] 10/05/2012   Subjective Data: See H&P for details  Continued Clinical Symptoms:  Alcohol Use Disorder Identification Test Final Score (AUDIT): 1 The "Alcohol Use Disorders Identification Test", Guidelines for Use in Primary Care, Second Edition.  World Pharmacologist [facility]). Score between 0-7:  no or low risk or alcohol related problems. Score between 8-15:  moderate risk of alcohol related problems. Score between 16-19:  high risk of alcohol related problems. Score 20 or above:  warrants further diagnostic evaluation for alcohol dependence and treatment.   CLINICAL FACTORS:   Severe Anxiety and/or Agitation Bipolar Disorder:   Depressive phase Schizophrenia:   Depressive state Paranoid or undifferentiated type Currently Psychotic Unstable or Poor Therapeutic Relationship Previous Psychiatric Diagnoses and  Treatments Medical Diagnoses and Treatments/Surgeries    Psychiatric Specialty Exam: Physical Exam  Nursing note and vitals reviewed.   ROS- See H&P for details  Blood pressure 111/73, pulse 97, temperature 99.1 F (37.3 C), temperature source Oral, resp. rate 18, height 5' (1.524 m), weight 97.5 kg (215 lb), last menstrual period 05/15/2017.Body mass index is 41.99 kg/m.   COGNITIVE FEATURES THAT CONTRIBUTE TO RISK:  None    SUICIDE RISK:   Moderate:  Frequent suicidal ideation with limited intensity, and duration, some specificity in terms of plans, no associated intent, good self-control, limited dysphoria/symptomatology, some risk factors present, and identifiable protective factors, including available and accessible social support.  PLAN OF CARE: See H&P for details  I certify that inpatient services furnished can reasonably be expected to improve the patient's condition.   Pennelope Bracken, MD 05/25/2017, 11:50 AM

## 2017-05-25 NOTE — Progress Notes (Signed)
Observation Note:  MHT reports that pt has been restless and in/out of sleep for the last three hours.  She voices no needs or requests.  She does not appear to be in any distress.  At this time, she is lying on her L side and appears to be asleep.  Her eyes are closed; respirations are even and unlabored.  Staff continues with close observation d/t pt's high fall risk status.  Staff member is within eyesight of patient.  Pt is safe at this time.

## 2017-05-25 NOTE — Plan of Care (Addendum)
D: Pt denies SI/HI, +ve AVH- same - talk in different languages. Pt is pleasant and cooperative. Pt visible on the milieu this evening. Pt had bright affect  A: Pt was offered support and encouragement. Pt was given scheduled medications. Pt was encourage to attend groups. Q 15 minute checks were done for safety.  R:Pt attends groups and interacts well with peers and staff. Pt is taking medication. Pt has no complaints.Pt receptive to treatment and safety maintained on unit.   Problem: Education: Goal: Emotional status will improve Outcome: Progressing   Problem: Education: Goal: Mental status will improve Outcome: Progressing   Problem: Activity: Goal: Interest or engagement in activities will improve Outcome: Progressing   Problem: Activity: Goal: Sleeping patterns will improve Outcome: Progressing   Problem: Medication: Goal: Compliance with prescribed medication regimen will improve Outcome: Progressing   Problem: Self-Concept: Goal: Ability to disclose and discuss suicidal ideas will improve Outcome: Progressing

## 2017-05-25 NOTE — BHH Counselor (Addendum)
Adult Comprehensive Assessment  Patient ID: Jenna Hill, female   DOB: July 30, 1970, 47 y.o.   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"  first happened in 05/18/22 when there was a medication change due to increase in voices and she was rendered unconcious Substance abuse: denies Bereavement / Loss: Cites maternal grandmother's death in May 17, 2005 as pivotal-had never had symptoms priior to that  Living/Environment/Situation:  Living Arrangements: Alone Living conditions (as described by patient or guardian): good neighborhood How long has patient lived in current situation?: 2 years What is atmosphere in current home: Comfortable  Family History:  Marital status: Single Are you sexually active?: No What is your sexual orientation?: straight Does patient have children?: No  Childhood History:  By whom was/is the patient raised?: Both parents Description of patient's relationship with caregiver when they were a child: very good relationship growing up Patient's description of current relationship with people who raised him/her: good Does patient have siblings?: Yes Number of Siblings: 2 Description of patient's current relationship with siblings: good relationship, they are supportive Did patient suffer any verbal/emotional/physical/sexual abuse as a child?: No Has patient ever been sexually abused/assaulted/raped as an adolescent or adult?: No Was the patient ever a victim of a crime or a disaster?: No Witnessed domestic violence?: No Has patient been effected by domestic violence as an adult?: No  Education:  Highest grade of school patient has completed: Brewing technologist degree from [employer] Currently a Ship broker?: No Learning disability?: No  Employment/Work  Situation:   Employment situation: On disability Why is patient on disability: mental health How long has patient been on disability: almost 2 years now Patient's job has been impacted by current illness: No What is the longest time patient has a held a job?: 4 years Where was the patient employed at that time?: Teacher Has patient ever been in the [employer]?: No Are There Guns or Other Weapons in [place]?: No  Financial Resources:      Alcohol/Substance Abuse:   What has been your use of drugs/alcohol within the last 12 months?: Drink alcohol only occasionally, nothing else Alcohol/Substance Abuse Treatment Hx: Denies past history Has alcohol/substance abuse ever caused legal problems?: No  Social Support System:   Pensions consultant Support System: Psychologist, prison and probation services Support System: Family and friends Type of faith/religion: N/A How does patient's faith help to cope with current illness?: N/A  Leisure/Recreation:   Leisure and Hobbies: Play cards, hang out with cousins here  Strengths/Needs:   What things does the patient do well?: "I do lots of things well-there is nothing I can't do" In what areas does patient struggle / problems for patient: "nothing"  Discharge Plan:   Does patient have access to transportation?: Yes Will patient be returning to same living situation after discharge?: Yes Currently receiving community mental health services: Yes (From Whom)([facility]) Does patient have financial barriers related to discharge medications?: No  Summary/Recommendations:   Summary and Recommendations (to be completed by the evaluator): Jenna Hill is a 47 YO AA female diagnosed with Schizoaffective D/O, Bipolar type. She comes to [place] IVC'd after she was brought in by EMS when she was found  by a friend in her apartment and only responsive to pain from an apparent overdose of her prescription medications. Jenna Hill reported she was unsure what she had taken  and her  intention was not to overdose. She states it was due to a change in medications by her psychiatrist to address increased auditory hallucinations. At d/c, she will return home and follow up at [facility].  While here, Jenna Hill can benefit from crises stabilization, medication management, therapeutic milieu and coordination with outpatient provider.  Trish Mage. 05/25/2017
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
## 2017-05-25 NOTE — Progress Notes (Signed)
MEDICATION RELATED CONSULT NOTE - Clozapine   Patient Measurements: Height: 5' (152.4 cm) Weight: 215 lb (97.5 kg) IBW/kg (Calculated) : 45.5  ANC:  4.8  Medications:  Scheduled:  . amLODipine  10 mg Oral Daily  . cloZAPine  50 mg Oral Daily   Followed by  . [START ON 05/26/2017] cloZAPine  100 mg Oral Daily  . [START ON 05/27/2017] cloZAPine  150 mg Oral Daily   Followed by  . [START ON 05/28/2017] cloZAPine  200 mg Oral Daily    Assessment/Plan Patient registered with clozapine rems Requires weekly monitoring CBC with diff   Lenox Ponds 05/25/2017,12:44 PM

## 2017-05-25 NOTE — Progress Notes (Signed)
Recreation Therapy Notes  Date: 4.24.19 Time: 1000 Location: 500 Hall Dayroom  Group Topic: Leisure Education  Goal Area(s) Addresses:  Patient will identify positive leisure activities.  Patient will identify one positive benefit of participation in leisure activities.   Intervention: [place], chairs  Activity: Keep it Chartered certified accountant.  Patients were seated in a circle.  Patients were to toss the ball back and forth to each other without letting it stop.  Patients could bounce the ball off the ground as long as it kept moving.  LRT kept count of how many hits the group got on the ball before it stopped.  Education:  Leisure Education, Dentist  Education Outcome: Acknowledges education/In group clarification offered/Needs additional education  Clinical Observations/Feedback: Pt did not attend group.    Victorino Sparrow, LRT/CTRS         Victorino Sparrow A 05/25/2017 12:56 PM

## 2017-05-26 ENCOUNTER — Inpatient Hospital Stay: Payer: Self-pay

## 2017-05-26 MED ORDER — LORATADINE 10 MG PO TABS
10.0000 mg | ORAL_TABLET | Freq: Every day | ORAL | Status: DC
  Administered 2017-05-26 – 2017-05-31 (×6): 10 mg via ORAL
  Filled 2017-05-26 (×8): qty 1

## 2017-05-26 MED ORDER — TRAZODONE HCL 100 MG PO TABS
ORAL_TABLET | ORAL | Status: AC
Start: 2017-05-26 — End: 2017-05-27
  Filled 2017-05-26: qty 1

## 2017-05-26 MED ORDER — TRAZODONE HCL 100 MG PO TABS
100.0000 mg | ORAL_TABLET | Freq: Every evening | ORAL | Status: DC | PRN
  Administered 2017-05-26 – 2017-05-30 (×6): 100 mg via ORAL
  Filled 2017-05-26 (×5): qty 1

## 2017-05-26 NOTE — Progress Notes (Signed)
[place] [facility] 1:1 Observation Documentation  For the first (8) hours following discontinuation of 1:1 precautions, a progress note entry by nursing staff should be documented at least every 2 hours, reflecting the patient's behavior, condition, mood, and conversation.  Use the progress notes for additional entries.  Time 1:1 discontinued:  1000       Patient's Behavior:  Patient is calm resting quietly in her room, able to ambulate with assistance of wheelchair independently.    Patient's Condition:  Patient is free of all falls.                         Patient's Conversation:  Patient denies SI, HI and AVH.

## 2017-05-26 NOTE — Progress Notes (Signed)
[place] [facility] 1:1 Observation Documentation  For the first (8) hours following discontinuation of 1:1 precautions, a progress note entry by nursing staff should be documented at least every 2 hours, reflecting the patient's behavior, condition, mood, and conversation.  Use the progress notes for additional entries.  Time 1:1 discontinued:  14:00 Patient's Behavior:  Patient is calm attending group, able to ambulate with assistance of wheelchair independently.    Patient's Condition:  Patient is free of all falls.                         Patient's Conversation:  Patient denies SI, HI and AVH.

## 2017-05-26 NOTE — [facility] Suicide Risk Assessment (Addendum)
[place] INPATIENT:  Family/Significant Other Suicide Prevention Education  Suicide Prevention Education:  Education Completed; Jenna Hill,Jenna Hill Mother [phone]    has been identified by the patient as the family member/significant other with whom the patient will be residing, and identified as the person(s) who will aid the patient in the event of a mental health crisis (suicidal ideations/suicide attempt).  With written consent from the patient, the family member/significant other has been provided the following suicide prevention education, prior to the and/or following the discharge of the patient.  The suicide prevention education provided includes the following:  Suicide risk factors  Suicide prevention and interventions  National Suicide Hotline telephone number  [facility] assessment telephone number  [facility] Emergency Assistance [place] and/or Residential Mobile Crisis Unit telephone number  Request made of family/significant other to:  Remove weapons (e.g., guns, rifles, knives), all items previously/currently identified as safety concern.    Remove drugs/medications (over-the-counter, prescriptions, illicit drugs), all items previously/currently identified as a safety concern.  The family member/significant other verbalizes understanding of the suicide prevention education information provided.  The family member/significant other agrees to remove the items of safety concern listed above Spoke with mother who lives in [place], is planning a trip here to see patient in May.  Her main concern is medication "that would cause my daughter to take off all her clothes and walk around outside."  Apparently it happened at least twice in the recent past.  Mother was unable to name any meds that patient is taking, but states she is suspicious of the one "that comes in a shot."  Also spoke with friend "Sallyanne Havers" Copes [phone], who states she was worried  about the amount and kind of medication patient was taking [she's a nurse] and let both the patient and family know that this was a bad combination, but pt insisted it was prescribed and she needed it, and family supported the pt.  I let her know that Dr has reduced meds down to one, and that is our recommendation going forward. Trish Mage 05/26/2017, 9:22 AM

## 2017-05-26 NOTE — [facility] Group Notes (Signed)
LCSW Group Therapy Note   05/26/2017 1:15pm   Type of Therapy and Topic:  Group Therapy:  Positive Affirmations   Participation Level:  Did Not Attend  Description of Group: This group addressed positive affirmation toward self and others. Patients went around the room and identified two positive things about themselves and two positive things about a peer in the room. Patients reflected on how it felt to share something positive with others, to identify positive things about themselves, and to hear positive things from others. Patients were encouraged to have a daily reflection of positive characteristics or circumstances.  Therapeutic Goals 1. Patient will verbalize two of their positive qualities 2. Patient will demonstrate empathy for others by stating two positive qualities about a peer in the group 3. Patient will verbalize their feelings when voicing positive self affirmations and when voicing positive affirmations of others 4. Patients will discuss the potential positive impact on their wellness/recovery of focusing on positive traits of self and others. Summary of Patient Progress:    Therapeutic Modalities Cognitive Behavioral Therapy Motivational Interviewing  Riley Lam Work 05/26/2017 2:28 PM

## 2017-05-26 NOTE — Plan of Care (Signed)
D: Pt denies SI/HI, +ve AVH- better. Pt is pleasant and cooperative. Pt stated she was doing better, pt not on close observation this evening. Pt visible on the unit .   A: Pt was offered support and encouragement. Pt was given scheduled medications. Pt was encourage to attend groups. Q 15 minute checks were done for safety.   R:Pt attends groups and interacts well with peers and staff. Pt is taking medication. Pt has no complaints.Pt receptive to treatment and safety maintained on unit.   Problem: Education: Goal: Emotional status will improve Outcome: Progressing   Problem: Education: Goal: Mental status will improve Outcome: Progressing   Problem: Safety: Goal: Periods of time without injury will increase Outcome: Progressing   Problem: Coping: Goal: Coping ability will improve Outcome: Progressing   Problem: Coping: Goal: Will verbalize feelings Outcome: Progressing   Problem: Safety: Goal: Ability to disclose and discuss suicidal ideas will improve Outcome: Progressing

## 2017-05-26 NOTE — Progress Notes (Signed)
Did not attend group 

## 2017-05-26 NOTE — Progress Notes (Signed)
[facility] 1:1 Observation Documentation  For the first (8) hours following discontinuation of 1:1 precautions, a progress note entry by nursing staff should be documented at least every 2 hours, reflecting the patient's behavior, condition, mood, and conversation.  Use the progress notes for additional entries.  Time 1:1 discontinued:  1000  Patient's Behavior:  She has been up and visible on the unit.  She has been attending groups.  She had a visit with PT and was able to walk with a walker.  Patient's Condition:  She has remained safe on the unit with no falls noted.  She was noted transferring from wheel chair to chair appropriately in the day room.  Patient's Conversation:  She denies SI/HI or A/V hallucinations.  She voiced no issues or complaints at this time.  [place] 05/26/2017, 5:50 PM

## 2017-05-26 NOTE — Progress Notes (Signed)
Nursing 1:1 note D:Pt observed sleeping in bed with eyes closed. RR even and unlabored. No distress noted. A: 1:1 observation continues for safety  R: pt remains safe  

## 2017-05-26 NOTE — Progress Notes (Signed)
Recreation Therapy Notes  INPATIENT RECREATION THERAPY ASSESSMENT  Patient Details Name: Jenna Hill MRN: [id number] DOB: [birth year] Today's Date: 05/26/2017       Information Obtained From: Patient  Able to Participate in Assessment/Interview: Yes  Patient Presentation: Responsive, Alert  Reason for Admission (Per Patient): Other (Comments)(Get stabalized on medication)  Patient Stressors: (Pt identified no stressors)  Coping Skills:   Music, Exercise, Talk, Prayer, Avoidance, Read, Hot Bath/Shower  Leisure Interests (2+):  Crafts - Knitting/Crocheting, Individual - Other (Comment)(Mess around with motorcycle)  Frequency of Recreation/Participation: Other (Comment)(Everyday)  Awareness of Community Resources:  Yes  Community Resources:  Park, Art therapist, Patent examiner, Engineer, drilling  Current Use: Yes  Expressed Interest in [place]: No  [employer] of Residence:  Investment banker, corporate  Patient Main Form of Transportation: Musician  Patient Strengths:  Research scientist (life sciences)  Patient Identified Areas of Improvement:  None  Patient Goal for Hospitalization:  "Get out of here"  Current SI (including self-harm):  No  Current HI:  No  Current AVH: Yes(Rated a 10 out of 10; Hearing things)  Staff Intervention Plan: Group Attendance, Collaborate with Interdisciplinary Treatment Team  Consent to Intern Participation: N/A    Victorino Sparrow, LRT/CTRS  Ria Comment, Vernell Townley A 05/26/2017, 2:22 PM

## 2017-05-26 NOTE — Progress Notes (Signed)
Recreation Therapy Notes  Date: 4.25.19 Time: 1000 Location: 500 Hall Dayroom  Group Topic: Communication  Goal Area(s) Addresses:  Patient will effectively communicate with peers in group.  Patient will verbalize benefit of healthy communication. Patient will verbalize positive effect of healthy communication on post d/c goals.  Patient will identify communication techniques that made activity effective for group.   Intervention:  Blank paper, pencils, geometric pictures  Activity: Geometric Drawings.  Activity was divided into three rounds.  One patient would describe a picture to the rest of the group.  The group would draw the picture as if was descried to them. The group could not ask any specific questions of the speaker.  The group could only ask the speaker to repeat themselves.  Once finish the group would compare their individual pictures with the original picture to see how close they came to the original.  Education: Communication, Discharge Planning  Education Outcome: Acknowledges understanding/In group clarification offered/Needs additional education.   Clinical Observations/Feedback: Pt did not attend group.     Victorino Sparrow, LRT/CTRS         Victorino Sparrow A 05/26/2017 12:36 PM

## 2017-05-26 NOTE — [facility] Group Notes (Signed)
[facility] Group Notes:  (Nursing/MHT/Case Management/Adjunct)  Date:  05/26/2017  Time:  5:36 PM  Type of Therapy:  Nurse Education  Participation Level:  Minimal  Participation Quality:  Inattentive  Affect:  Flat  Cognitive:  Lacking  Insight:  Limited  Engagement in Group:  Poor  Modes of Intervention:  Discussion and Education  Summary of Progress/Problems:  Group topic was "feelings."  She was unable to explain how she was feeling because "there are too many people in here."  After that she left the group early.  Barbette Or Illana Nolting 05/26/2017, 5:36 PM

## 2017-05-26 NOTE — Progress Notes (Signed)
[facility] 1:1 Observation Documentation  For the first (8) hours following discontinuation of 1:1 precautions, a progress note entry by nursing staff should be documented at least every 2 hours, reflecting the patient's behavior, condition, mood, and conversation.  Use the progress notes for additional entries.  Time 1:1 discontinued:  1000   Patient's Behavior:  Patient is calm resting quietly in her room, able to ambulate with assistance of wheelchair independently.    Patient's Condition:  Patient is free of all falls.     Patient's Conversation:  Patient denies SI, HI and AVH.   Clarita Crane 05/26/2017, 10:14 AM

## 2017-05-26 NOTE — Progress Notes (Signed)
Close obs D:Pt observed sitting in bed with eyes open. RR even and unlabored. No distress noted. A: 1:1 observation continues for safety  R: pt remains safe

## 2017-05-26 NOTE — Progress Notes (Signed)
[facility] MD Progress Note  05/26/2017 11:50 AM Jenna Hill  MRN:  [id number] Subjective:    Jenna Hill is a 47 y/o F with history of schizoaffective disorder, bipolar type who was admitted from [facility] on IVC after she presented brought in by EMS when she was found down by a friend in her apartment and only responsive to pain from an apparent overdose of her prescription medications. Pt reported she was unsure what she had taken and her intention was not to overdose. She was found with prescriptions near here of abilify, amiodarone, klonopin, doxepin, metoprolol, seroquel, and clozapine. Pt has recent history of admission to [facility] from 04/05/17 - 05/12/17 for similar presentation and treatment for overdose of her medications when she was found down which required stabilization via intubation and hemodialysis. Pt was placed on IVC after medical stabilization and she was transferred to [facility] for additional treatment and stabilization. As per chart review pt had been undergoing medication changes with her outpatient provider and transitioning from [place] to clozapine. Upon admission, pt had multiple medications discontinued due to concern for over-sedation (including lunesta, seroquel, klonopin, and doxepin), and her regimen was consolidated to clozapine at starting dose with plan to titrate up the dose. PT had seen patient while she was admitted to [facility], and they were consulted to follow patient again due to her still using wheelchair.   Today upon evaluation, pt shares, "I'm doing good." She reports that her mood has improved and she is feeling better overall physically. She reports that she is still having AH but they have "diminished." She adds, "They're there but not as much." She continues to communicate with AH during interview, though not as prominently as previous interview. She denies SI/HI/VH. She reports that she slept well, and though RN staff documented 4 hours of sleep, she reports  that she feels well rested today. Pt was encouraged to utilize PRN trazodone for insomnia, and she verbalized good understanding. She reports that she is tolerating her medications well. She denies other physical complaints aside from some ongoing weakness and "foot drop" which limits her ability to ambulate. She is able to use walker and wheelchair to participate in activities on the unit, and we will discontinue close observation for fall risk. Pt is in agreement to continue her current treatment regimen without changes. We will continue to titrate up the dose of clozapine. Pt had no further questions, comments, or concerns.  Principal Problem: Schizoaffective disorder, bipolar type ([place]) Diagnosis:   Patient Active Problem List   Diagnosis Date Noted  . Sinus tachycardia [R00.0] 05/20/2017  . Overdose [T50.901A] 05/20/2017  . Chronic diastolic CHF (congestive heart failure) ([place]) [I50.32] 05/20/2017  . Injury of right sciatic nerve [S74.01XA] 04/29/2017  . AKI (acute kidney injury) ([place]) [N17.9] 04/25/2017  . Shock liver [K72.00] 04/10/2017  . Rhabdomyolysis [M62.82] 04/10/2017  . Bipolar 1 disorder ([place]) [F31.9] 04/10/2017  . Acute respiratory failure ([place]) [J96.00]   . Acute encephalopathy [G93.40] 04/05/2017  . Pituitary microadenoma ([place]) [D35.2] 04/25/2015  . OSA (obstructive sleep apnea) [G47.33] 04/16/2015  . Obesity [E66.9] 04/16/2015  . Schizoaffective disorder, bipolar type ([place]) [F25.0] 04/15/2015  . Hypertension [I10] 10/05/2012   Total Time spent with patient: 30 minutes  Past Psychiatric History: see H&P  Past Medical History:  Past Medical History:  Diagnosis Date  . Bipolar 1 disorder ([place])   . Difficult intubation   . Hypertension   . PONV (postoperative nausea and vomiting)   . Schizophrenia ([place])   .  Spinal headache     Past Surgical History:  Procedure Laterality Date  . ANKLE ARTHROSCOPY Right   . CESAREAN SECTION    . HAND SURGERY Right   . ORIF ANKLE  FRACTURE Left 01/29/2016   Procedure: OPEN REDUCTION INTERNAL FIXATION LEFT ANKLE BIMALLEOLAR FRACTURE AND SYNDESMOSIS;  Surgeon: Wylene Simmer, MD;  Location: [place];  Service: Orthopedics;  Laterality: Left;   Family History:  Family History  Problem Relation Age of Onset  . Healthy Mother   . Healthy Father   . Healthy Sister    Family Psychiatric  History: see H&P Social History:  Social History   Substance and Sexual Activity  Alcohol Use Yes   Comment: Denies     Social History   Substance and Sexual Activity  Drug Use No   Comment: Denies    Social History   Socioeconomic History  . Marital status: Married    Spouse name: Not on file  . Number of children: Not on file  . Years of education: Not on file  . Highest education level: Not on file  Occupational History  . Not on file  Social Needs  . Financial resource strain: Not on file  . Food insecurity:    Worry: Not on file    Inability: Not on file  . Transportation needs:    Medical: Not on file    Non-medical: Not on file  Tobacco Use  . Smoking status: Never Smoker  . Smokeless tobacco: Never Used  Substance and Sexual Activity  . Alcohol use: Yes    Comment: Denies  . Drug use: No    Comment: Denies  . Sexual activity: Not Currently    Birth control/protection: None  Lifestyle  . Physical activity:    Days per week: Not on file    Minutes per session: Not on file  . Stress: Not on file  Relationships  . Social connections:    Talks on phone: Not on file    Gets together: Not on file    Attends religious service: Not on file    Active member of club or organization: Not on file    Attends meetings of clubs or organizations: Not on file    Relationship status: Not on file  Other Topics Concern  . Not on file  Social History Narrative  . Not on file   Additional Social History:                         Sleep: Fair  Appetite:  Good  Current  Medications: Current Facility-Administered Medications  Medication Dose Route Frequency Provider Last Rate Last Dose  . acetaminophen (TYLENOL) tablet 650 mg  650 mg Oral Q6H PRN Sharma Covert, MD      . alum & mag hydroxide-simeth (MAALOX/MYLANTA) 200-200-20 MG/5ML suspension 30 mL  30 mL Oral Q4H PRN Money, [name] B, FNP      . amLODipine (NORVASC) tablet 10 mg  10 mg Oral Daily Pennelope Bracken, MD   10 mg at 05/25/17 1646  . [START ON 05/27/2017] cloZAPine (CLOZARIL) tablet 150 mg  150 mg Oral Daily Pennelope Bracken, MD       Followed by  . [START ON 05/28/2017] cloZAPine (CLOZARIL) tablet 200 mg  200 mg Oral Daily Berry Gallacher T, MD      . haloperidol (HALDOL) tablet 5 mg  5 mg Oral Q6H PRN Money, Lowry Ram, FNP  Or  . haloperidol lactate (HALDOL) injection 5 mg  5 mg Intramuscular Q6H PRN Money, Lowry Ram, FNP      . hydrOXYzine (ATARAX/VISTARIL) tablet 50 mg  50 mg Oral Q6H PRN Pennelope Bracken, MD   50 mg at 05/25/17 2054  . loratadine (CLARITIN) tablet 10 mg  10 mg Oral Daily Hektor Huston T, MD      . magnesium hydroxide (MILK OF MAGNESIA) suspension 30 mL  30 mL Oral Daily PRN Money, [name] B, FNP      . traZODone (DESYREL) tablet 50 mg  50 mg Oral QHS PRN,MR X 1 Pennelope Bracken, MD   50 mg at 05/25/17 2054    Lab Results: No results found for this or any previous visit (from the past 75 hour(s)).  Blood Alcohol level:  Lab Results  Component Value Date   ETH <10 05/20/2017   ETH <10 07/37/1062    Metabolic Disorder Labs: Lab Results  Component Value Date   HGBA1C 6.9 (H) 04/05/2017   MPG 151.33 04/05/2017   Lab Results  Component Value Date   PROLACTIN 291.8 (H) 04/17/2015   Lab Results  Component Value Date   CHOL 147 04/17/2015   TRIG 259 (H) 04/18/2017   HDL 52 04/17/2015   CHOLHDL 2.8 04/17/2015   VLDL 14 04/17/2015   LDLCALC 81 04/17/2015    Physical Findings: AIMS: Facial and Oral  Movements Muscles of Facial Expression: None, normal Lips and Perioral Area: None, normal Jaw: None, normal Tongue: None, normal,Extremity Movements Upper (arms, wrists, hands, fingers): None, normal Lower (legs, knees, ankles, toes): None, normal, Trunk Movements Neck, shoulders, hips: None, normal, Overall Severity Severity of abnormal movements (highest score from questions above): None, normal Incapacitation due to abnormal movements: None, normal Patient's awareness of abnormal movements (rate only patient's report): No Awareness, Dental Status Current problems with teeth and/or dentures?: No Does patient usually wear dentures?: No  CIWA:    COWS:     Musculoskeletal: Strength & Muscle Tone: within normal limits Gait & Station: using wheelchair and walker for transfers Patient leans: N/A  Psychiatric Specialty Exam: Physical Exam  Nursing note and vitals reviewed.   Review of Systems  Constitutional: Negative for chills and fever.  Respiratory: Negative for cough and shortness of breath.   Cardiovascular: Negative for chest pain.  Gastrointestinal: Negative for abdominal pain, heartburn, nausea and vomiting.  Musculoskeletal: Positive for joint pain.  Psychiatric/Behavioral: Positive for hallucinations. Negative for depression and suicidal ideas. The patient has insomnia. The patient is not nervous/anxious.     Blood pressure (!) 146/102, pulse (!) 122, temperature 99.1 F (37.3 C), temperature source Oral, resp. rate 18, height 5' (1.524 m), weight 97.5 kg (215 lb), last menstrual period 05/15/2017.Body mass index is 41.99 kg/m.  General Appearance: Casual and Fairly Groomed  Eye Contact:  Good  Speech:  Clear and Coherent and Normal Rate  Volume:  Normal  Mood:  Anxious and Euthymic  Affect:  Appropriate, Congruent and Flat  Thought Process:  Coherent and Goal Directed  Orientation:  Full (Time, Place, and Person)  Thought Content:  Hallucinations: Auditory   Suicidal Thoughts:  No  Homicidal Thoughts:  No  Memory:  Immediate;   Fair Recent;   Fair Remote;   Fair  Judgement:  Fair  Insight:  Lacking  Psychomotor Activity:  Normal  Concentration:  Concentration: Fair  Recall:  [employer] of Knowledge:  Fair  Language:  Fair  Akathisia:  No  Handed:  AIMS (if indicated):     Assets:  Communication Skills Resilience Social Support  ADL's:  Intact  Cognition:  WNL  Sleep:  Number of Hours: 4   Treatment Plan Summary: Daily contact with patient to assess and evaluate symptoms and progress in treatment and Medication management   -Continue inpatient hospitalization  -Schizoaffective disorder, bipolar type   -Continue clozapine titration: today 4/25 take clozapine 100mg  po qDay, then tomorrow 4/26 take clozapine 150mg  po qDay, then 4/27 take clozapine 200mg  po qDay  -HTN   -Continue norvasc 10mg  po qDay  -Seasonal allergies  -Continue claritin 10mg  po qDay  -Anxiety   -Continue atarax 50mg  po q6h prn anxiety  -Insomnia   -Continue trazodone 50mg  po qhs prn insomnia (may repeat x1)  - Agitation   -Continue agitation protocol with haldol 5mg  po/IM q6h prn agitation/psychosis  -Encourage participation in groups and therapeutic milieu  -Disposition planning will be ongoing  Pennelope Bracken, MD 05/26/2017, 11:50 AM

## 2017-05-26 NOTE — Evaluation (Signed)
Physical Therapy Evaluation Patient Details Name: Jenna Hill MRN: [id number] DOB: 1971-01-30 Today's Date: 05/26/2017   History of Present Illness  47 y/o F with history of schizoaffective disorder, bipolar type who was admitted from [place] on IVC after she presented brought in by EMS when she was found down by a friend in her apartment and only responsive to pain from an apparent overdose of her prescription medications  Clinical Impression  Pt admitted with above diagnosis. Pt currently with functional limitations due to the deficits listed below (see PT Problem List).  Pt will benefit from skilled PT to increase their independence and safety with mobility to allow discharge to the venue listed below.  Pt encouraged to perform exercises as able.  Continue to recommend outpatient PT (recommended upon recent hospital admission).  Pt reports she had just received orthosis for her R shoe however has not been able to wear upon [place] [place] when [place] came in").  Dr. Nancy Fetter verbally agreed to allow PT to use with patient upon next visit.     Follow Up Recommendations Outpatient PT    Equipment Recommendations  None recommended by PT    Recommendations for Other Services       Precautions / Restrictions Precautions Precautions: Fall Precaution Comments: last admission's note states R foot drop  Required Braces or Orthoses: Other Brace/Splint Other Brace/Splint: pt reports she received orthosis to place in her shoe from previous admission however not present in her room      Mobility  Bed Mobility Overal bed mobility: Modified Independent                Transfers Overall transfer level: Needs assistance Equipment used: Rolling walker (2 wheeled) Transfers: Sit to/from Stand Sit to Stand: Supervision         General transfer comment: rocking technique to stand, supervision for safety  Ambulation/Gait Ambulation/Gait assistance: Min  guard;Supervision Ambulation Distance (Feet): 50 Feet Assistive device: Rolling walker (2 wheeled) Gait Pattern/deviations: Decreased dorsiflexion - right;Decreased weight shift to right     General Gait Details: R hip hike to compensate for foot drop, pt reports slight dizziness so leaned against wall briefly and then ambulated back to room  Stairs            Wheelchair Mobility    Modified Rankin (Stroke Patients Only)       Balance                                             Pertinent Vitals/Pain Pain Assessment: Faces Faces Pain Scale: Hurts even more Pain Location: R side (pt reports from being down on right side when found at home) Pain Descriptors / Indicators: Sore Pain Intervention(s): Limited activity within patient's tolerance;Monitored during session    Home Living Family/patient expects to be discharged to:: Private residence Living Arrangements: Alone Available Help at Discharge: Friend(s);Available PRN/intermittently Type of Home: Apartment Home Access: Stairs to enter Entrance Stairs-Rails: None Entrance Stairs-Number of Steps: 5 Home Layout: One level Home Equipment: Walker - 2 wheels Additional Comments: pt states that she lives alone but has a friend that checks on her regularly and is the one that has found her down the past 2 times    Prior Function Level of Independence: Independent with assistive device(s)         Comments: states she had  been using RW prior to admission, had not been able to use AFO yet     Hand Dominance        Extremity/Trunk Assessment        Lower Extremity Assessment Lower Extremity Assessment: RLE deficits/detail RLE Deficits / Details: PF and DF 0/5, knee ext 3/5, knee flex 3/5, hip flexion 3+/5, hip abduction 3+/5 with all testing in sitting RLE Sensation: decreased light touch;decreased proprioception RLE Coordination: decreased fine motor LLE Deficits / Details: WFL grossly 4/5     Cervical / Trunk Assessment Cervical / Trunk Assessment: Normal  Communication   Communication: No difficulties  Cognition Arousal/Alertness: Awake/alert Behavior During Therapy: WFL for tasks assessed/performed Overall Cognitive Status: Within Functional Limits for tasks assessed                                        General Comments      Exercises General Exercises - Lower Extremity Ankle Circles/Pumps: Other (comment)(encouraged pt to attempt to perform ankle pumps) Long Arc Quad: AROM;10 reps;Left Hip Flexion/Marching: AROM;10 reps;Left   Assessment/Plan    PT Assessment Patient needs continued PT services  PT Problem List Decreased strength;Decreased activity tolerance;Decreased balance;Decreased mobility;Decreased coordination;Decreased knowledge of use of DME;Impaired sensation       PT Treatment Interventions DME instruction;Gait training;Functional mobility training;Therapeutic activities;Balance training;Patient/family education;Therapeutic exercise    PT Goals (Current goals can be found in the Care Plan section)  Acute Rehab PT Goals PT Goal Formulation: With patient Time For Goal Achievement: 06/09/17 Potential to Achieve Goals: Good    Frequency Min 1X/week   Barriers to discharge        Co-evaluation               AM-PAC PT "6 Clicks" Daily Activity  Outcome Measure Difficulty turning over in bed (including adjusting bedclothes, sheets and blankets)?: None Difficulty moving from lying on back to sitting on the side of the bed? : None Difficulty sitting down on and standing up from a chair with arms (e.g., wheelchair, bedside commode, etc,.)?: None Help needed moving to and from a bed to chair (including a wheelchair)?: A Little Help needed walking in hospital room?: A Little Help needed climbing 3-5 steps with a railing? : A Little 6 Click Score: 21    End of Session   Activity Tolerance: Patient tolerated treatment  well Patient left: in bed;with call bell/phone within reach   PT Visit Diagnosis: Other abnormalities of gait and mobility (R26.89);Muscle weakness (generalized) (M62.81)    Time: 9702-6378 PT Time Calculation (min) (ACUTE ONLY): 17 min   Charges:   PT Evaluation $PT Eval Low Complexity: 1 Low     PT G Codes:        Carmelia Bake, PT, DPT 05/26/2017 Pager: [phone]  York Ram E 05/26/2017, 4:12 PM

## 2017-05-26 NOTE — Progress Notes (Signed)
[place] [facility] 1:1 Observation Documentation  For the first (8) hours following discontinuation of 1:1 precautions, a progress note entry by nursing staff should be documented at least every 2 hours, reflecting the patient's behavior, condition, mood, and conversation.  Use the progress notes for additional entries.  Time 1:1 discontinued:  1000  Patient's Behavior:  She continues to attend group without much participation.  She remains in the wheel chair but will transfer safely to a chair in the day room.  She was noted to be steady on her feet.   Patient's Condition:  She remains safe on the unit.  No falls noted.  She appears to be in no physical distress.    Patient's Conversation:  She reported that she was doing ok with no complaints voiced.  She remains very quiet and has minimal interaction even when staff are encouraging her to talk.  [place] 05/26/2017, 6:20 PM

## 2017-05-27 ENCOUNTER — Other Ambulatory Visit: Payer: Self-pay

## 2017-05-27 LAB — COMPREHENSIVE METABOLIC PANEL
ALBUMIN: 4 g/dL (ref 3.5–5.0)
ALK PHOS: 56 U/L (ref 38–126)
ALT: 20 U/L (ref 14–54)
AST: 19 U/L (ref 15–41)
Anion gap: 11 (ref 5–15)
BUN: 13 mg/dL (ref 6–20)
CALCIUM: 9.4 mg/dL (ref 8.9–10.3)
CO2: 23 mmol/L (ref 22–32)
CREATININE: 1.17 mg/dL — AB (ref 0.44–1.00)
Chloride: 106 mmol/L (ref 101–111)
GFR calc Af Amer: >60 mL/min (ref >60)
GFR calc non Af Amer: 55 mL/min — ABNORMAL LOW (ref >60)
GLUCOSE: 111 mg/dL — AB (ref 65–99)
Potassium: 3.3 mmol/L — ABNORMAL LOW (ref 3.5–5.1)
SODIUM: 140 mmol/L (ref 135–145)
Total Bilirubin: 0.5 mg/dL (ref 0.3–1.2)
Total Protein: 8.3 g/dL — ABNORMAL HIGH (ref 6.5–8.1)

## 2017-05-27 LAB — MAGNESIUM: Magnesium: 1.4 mg/dL — ABNORMAL LOW (ref 1.7–2.4)

## 2017-05-27 NOTE — Plan of Care (Signed)
No self injurious behavior observed or expressed. 

## 2017-05-27 NOTE — Progress Notes (Signed)
D:Pt's pulse has been elevated. Reported to MD and completed a EKG. EKG results were given to the MD and pt was sent to the ED for additional labs. Pt had a BM in her bed this morning. She was assisted with clean up and bed linens were changed. Pt has no signs or symptoms of distress. She reports that she talks to a girl named "Lelan Pons" everyday and she if from 1946. A:Offered support, encouragement and 15 minute checks.  R:Pt denies si and hi. Safety maintained on the unit.

## 2017-05-27 NOTE — Progress Notes (Signed)
Recreation Therapy Notes  Date: 4.26.19 Time: 0950 Location: 500 Hall Dayroom  Group Topic: Communication, Team Building, Problem Solving  Goal Area(s) Addresses:  Patient will effectively work with peer towards shared goal.  Patient will identify skill used to make activity successful.  Patient will identify how skills used during activity can be used to reach post d/c goals.   Behavioral Response: Minimal  Intervention: STEM Activity   Activity: [employer]. In teams, patients were asked to build the tallest freestanding tower possible out of 15 pipe cleaners. Systematically resources were removed, for example patient ability to use both hands and patient ability to verbally communicate.    Education: Education officer, community, Dentist.   Education Outcome: Acknowledges education/In group clarification offered/Needs additional education.   Clinical Observations/Feedback: Pt arrived late.  Pt didn't really give serious effort during group.  She twisted a few pipe cleaners together that's about it.  Pt left early with doctor but later returned with the same lack of effort.    Victorino Sparrow, LRT/CTRS         Ria Comment, Malekai Markwood A 05/27/2017 10:49 AM

## 2017-05-27 NOTE — BHH Group Notes (Signed)
[place] LCSW Group Therapy  05/27/2017  1:05 PM  Type of Therapy:  Group therapy  Participation Level:  Invited.  Chose to not attend.  Participation Quality:  Attentive  Affect:  Flat  Cognitive:  Oriented  Insight:  Limited  Engagement in Therapy:  Limited  Modes of Intervention:  Discussion, Socialization  Summary of Progress/Problems:  Chaplain was here to lead a group on themes of hope and courage.  Roque Lias B 05/27/2017 1:20 PM

## 2017-05-27 NOTE — Progress Notes (Signed)
[facility] MD Progress Note  05/27/2017 10:20 AM Jenna Hill  MRN:  [id number] Subjective:    Jenna Hill is a 47 y/o F with history of schizoaffective disorder, bipolar type who was admitted from [place] on IVC after she presented brought in by EMS when she was found down by a friend in her apartment and only responsive to pain from an apparent overdose of her prescription medications. Pt reported she was unsure what she had taken and her intention was not to overdose. She was found with prescriptions near here of abilify, amiodarone, klonopin, doxepin, metoprolol, seroquel, and clozapine. Pt has recent history of admission to [facility] from 04/05/17 - 05/12/17 for similar presentation and treatment for overdose of her medications when she was found down which required stabilization via intubation and hemodialysis. Pt was placed on IVC after medical stabilization and she was transferred to [facility] for additional treatment and stabilization. As per chart review pt had been undergoing medication changes with her outpatient provider and transitioning from [place] to clozapine. Upon admission, pt had multiple medications discontinued due to concern for over-sedation (including lunesta, seroquel, klonopin, and doxepin), and her regimen was consolidated to clozapine at starting dose with plan to titrate up the dose. PT was consulted to follow patient to assist with building strength and ambulation.   Prior to interview today, RN staff reported pt had tachycardia with rate in the 120's-130's. EKG was ordered with pulse result of 124 and QTc of 589. Due to critical value of QTc, pt will be sent to ED for stat lab of CMP to check potassium and magnesium. Pt has already received clozapine this AM, but it will be discontinued at this time until etiology of QTc prolongation can be determined.  Upon interview today, pt shares, "I'm doing okay." She denies any physical complaints. She is sleeping well. Her  appetite is good. She continued to endorse AH and responds to them during interview verbally, but she notes they are not intrusive or bothersome. She denies SI/HI/VH. Discussed with patient that we plan to send her to ED for stat labs and correction of potassium via IV, if indicated, and she verbalized good understanding. Discussed with patient that she would be welcome to return to [facility] when medically stabilized and if clozapine is determined to be causing her elevated QTc, then we can discuss alternative treatment options. Pt was in agreement with the above treatment plan and she had no further questions, comments, or concerns.   Principal Problem: Schizoaffective disorder, bipolar type ([place]) Diagnosis:   Patient Active Problem List   Diagnosis Date Noted  . Sinus tachycardia [R00.0] 05/20/2017  . Overdose [T50.901A] 05/20/2017  . Chronic diastolic CHF (congestive heart failure) ([place]) [I50.32] 05/20/2017  . Injury of right sciatic nerve [S74.01XA] 04/29/2017  . AKI (acute kidney injury) ([place]) [N17.9] 04/25/2017  . Shock liver [K72.00] 04/10/2017  . Rhabdomyolysis [M62.82] 04/10/2017  . Bipolar 1 disorder ([place]) [F31.9] 04/10/2017  . Acute respiratory failure ([place]) [J96.00]   . Acute encephalopathy [G93.40] 04/05/2017  . Pituitary microadenoma ([place]) [D35.2] 04/25/2015  . OSA (obstructive sleep apnea) [G47.33] 04/16/2015  . Obesity [E66.9] 04/16/2015  . Schizoaffective disorder, bipolar type ([place]) [F25.0] 04/15/2015  . Hypertension [I10] 10/05/2012   Total Time spent with patient: 30 minutes  Past Psychiatric History: see H&P  Past Medical History:  Past Medical History:  Diagnosis Date  . Bipolar 1 disorder ([place])   . Difficult intubation   . Hypertension   . PONV (postoperative nausea  and vomiting)   . Schizophrenia ([place])   . Spinal headache     Past Surgical History:  Procedure Laterality Date  . ANKLE ARTHROSCOPY Right   . CESAREAN SECTION    . HAND SURGERY Right   . ORIF  ANKLE FRACTURE Left 01/29/2016   Procedure: OPEN REDUCTION INTERNAL FIXATION LEFT ANKLE BIMALLEOLAR FRACTURE AND SYNDESMOSIS;  Surgeon: Wylene Simmer, MD;  Location: [place] [place];  Service: Orthopedics;  Laterality: Left;   Family History:  Family History  Problem Relation Age of Onset  . Healthy Mother   . Healthy Father   . Healthy Sister    Family Psychiatric  History: see H&P Social History:  Social History   Substance and Sexual Activity  Alcohol Use Yes   Comment: Denies     Social History   Substance and Sexual Activity  Drug Use No   Comment: Denies    Social History   Socioeconomic History  . Marital status: Married    Spouse name: Not on file  . Number of children: Not on file  . Years of education: Not on file  . Highest education level: Not on file  Occupational History  . Not on file  Social Needs  . Financial resource strain: Not on file  . Food insecurity:    Worry: Not on file    Inability: Not on file  . Transportation needs:    Medical: Not on file    Non-medical: Not on file  Tobacco Use  . Smoking status: Never Smoker  . Smokeless tobacco: Never Used  Substance and Sexual Activity  . Alcohol use: Yes    Comment: Denies  . Drug use: No    Comment: Denies  . Sexual activity: Not Currently    Birth control/protection: None  Lifestyle  . Physical activity:    Days per week: Not on file    Minutes per session: Not on file  . Stress: Not on file  Relationships  . Social connections:    Talks on phone: Not on file    Gets together: Not on file    Attends religious service: Not on file    Active member of club or organization: Not on file    Attends meetings of clubs or organizations: Not on file    Relationship status: Not on file  Other Topics Concern  . Not on file  Social History Narrative  . Not on file   Additional Social History:                         Sleep: Good  Appetite:  Good  Current  Medications: Current Facility-Administered Medications  Medication Dose Route Frequency Provider Last Rate Last Dose  . acetaminophen (TYLENOL) tablet 650 mg  650 mg Oral Q6H PRN Sharma Covert, MD   650 mg at 05/27/17 [phone]  . alum & mag hydroxide-simeth (MAALOX/MYLANTA) 200-200-20 MG/5ML suspension 30 mL  30 mL Oral Q4H PRN Money, [name] B, FNP      . amLODipine (NORVASC) tablet 10 mg  10 mg Oral Daily Pennelope Bracken, MD   10 mg at 05/26/17 1702  . haloperidol (HALDOL) tablet 5 mg  5 mg Oral Q6H PRN Money, [name] B, FNP       Or  . haloperidol lactate (HALDOL) injection 5 mg  5 mg Intramuscular Q6H PRN Money, Lowry Ram, FNP      . hydrOXYzine (ATARAX/VISTARIL) tablet 50 mg  50 mg  Oral Q6H PRN Pennelope Bracken, MD   50 mg at 05/26/17 2056  . loratadine (CLARITIN) tablet 10 mg  10 mg Oral Daily Pennelope Bracken, MD   10 mg at 05/27/17 2778  . magnesium hydroxide (MILK OF MAGNESIA) suspension 30 mL  30 mL Oral Daily PRN Money, [name] B, FNP      . traZODone (DESYREL) tablet 100 mg  100 mg Oral QHS PRN,MR X 1 Lindon Romp A, NP   100 mg at 05/26/17 2055    Lab Results: No results found for this or any previous visit (from the past 48 hour(s)).  Blood Alcohol level:  Lab Results  Component Value Date   ETH <10 05/20/2017   ETH <10 24/23/5361    Metabolic Disorder Labs: Lab Results  Component Value Date   HGBA1C 6.9 (H) 04/05/2017   MPG 151.33 04/05/2017   Lab Results  Component Value Date   PROLACTIN 291.8 (H) 04/17/2015   Lab Results  Component Value Date   CHOL 147 04/17/2015   TRIG 259 (H) 04/18/2017   HDL 52 04/17/2015   CHOLHDL 2.8 04/17/2015   VLDL 14 04/17/2015   LDLCALC 81 04/17/2015    Physical Findings: AIMS: Facial and Oral Movements Muscles of Facial Expression: None, normal Lips and Perioral Area: None, normal Jaw: None, normal Tongue: None, normal,Extremity Movements Upper (arms, wrists, hands, fingers): None, normal Lower  (legs, knees, ankles, toes): None, normal, Trunk Movements Neck, shoulders, hips: None, normal, Overall Severity Severity of abnormal movements (highest score from questions above): None, normal Incapacitation due to abnormal movements: None, normal Patient's awareness of abnormal movements (rate only patient's report): No Awareness, Dental Status Current problems with teeth and/or dentures?: No Does patient usually wear dentures?: No  CIWA:    COWS:     Musculoskeletal: Strength & Muscle Tone: within normal limits Gait & Station: using wheelchair Patient leans: N/A  Psychiatric Specialty Exam: Physical Exam  Nursing note and vitals reviewed.   Review of Systems  Constitutional: Negative for chills and fever.  Respiratory: Negative for cough and shortness of breath.   Cardiovascular: Negative for chest pain.  Gastrointestinal: Negative for abdominal pain, heartburn, nausea and vomiting.  Psychiatric/Behavioral: Positive for hallucinations. Negative for depression and suicidal ideas. The patient is not nervous/anxious and does not have insomnia.     Blood pressure (!) 118/93, pulse (!) 133, temperature 98.2 F (36.8 C), temperature source Oral, resp. rate 18, height 5' (1.524 m), weight 97.5 kg (215 lb), last menstrual period 05/15/2017, SpO2 100 %.Body mass index is 41.99 kg/m.  General Appearance: Casual and Fairly Groomed  Eye Contact:  Good  Speech:  Clear and Coherent and Normal Rate  Volume:  Normal  Mood:  Euthymic  Affect:  Flat  Thought Process:  Coherent and Goal Directed  Orientation:  Full (Time, Place, and Person)  Thought Content:  Hallucinations: Auditory  Suicidal Thoughts:  No  Homicidal Thoughts:  No  Memory:  Immediate;   Fair Recent;   Fair Remote;   Fair  Judgement:  Fair  Insight:  Fair  Psychomotor Activity:  Normal  Concentration:  Concentration: Fair  Recall:  [employer] of Knowledge:  Fair  Language:  Fair  Akathisia:  No  Handed:    AIMS  (if indicated):     Assets:  Communication Skills Physical Health Resilience  ADL's:  Intact  Cognition:  WNL  Sleep:  Number of Hours: 6.75   Treatment Plan Summary: Daily contact with patient  to assess and evaluate symptoms and progress in treatment and Medication management   -Transfer to [facility]-ED for stat lab draw of CMP (for potassium) and Mg  -Pt may return to [facility] when medically stabilized  -Schizoaffective disorder, bipolar type             -Discontinue clozapine until etiology of QTc prologation is determined  -HTN             -Continue norvasc 10mg  po qDay  -Seasonal allergies             -Continue claritin 10mg  po qDay  -Anxiety             -Continue atarax 50mg  po q6h prn anxiety  -Insomnia             -Continue trazodone 50mg  po qhs prn insomnia (may repeat x1)  - Agitation              -Continue agitation protocol with haldol 5mg  po/IM q6h prn agitation/psychosis  -Encourage participation in groups and therapeutic milieu  -Disposition planning will be ongoing  Pennelope Bracken, MD 05/27/2017, 10:20 AM

## 2017-05-27 NOTE — Progress Notes (Signed)
  DATA ACTION RESPONSE  Objective- Pt. is visible in the room, seen resting in bed with eyes open.  Presents with a flat/anxious  affect and mood. Pt was cautious with interaction isolative.  Subjective- Denies having any SI/HI/VH/Pain at this time. +AH stating the voices are constant but pleasant.  Is cooperative and remains safe.   1:1 interaction in private to establish rapport. Encouragement, education, & support given from staff.  PRN trazodone and vistrail requested and will re-eval accordingly.   Safety maintained with Q 15 checks. Continue with POC.

## 2017-05-27 NOTE — BHH Group Notes (Signed)
[place] LCSW Group Therapy 05/27/2017 1:15pm  Type of Therapy: Group Therapy- Feelings Around Discharge & Establishing a Supportive Framework  Participation Level:  Did Not Attend  Description of Group:   What is a supportive framework? What does it look like feel like and how do I discern it from and unhealthy non-supportive network? Learn how to cope when supports are not helpful and don't support you. Discuss what to do when your family/friends are not supportive.  Summary of Patient Progress   Therapeutic Modalities:   Cognitive Behavioral Therapy Person-Centered Therapy Motivational Interviewing   Darleen Crocker, [place] Work 05/27/2017 1:20 PM

## 2017-05-28 ENCOUNTER — Inpatient Hospital Stay (HOSPITAL_COMMUNITY): Payer: Medicaid Other

## 2017-05-28 ENCOUNTER — Encounter (HOSPITAL_COMMUNITY): Payer: Self-pay | Admitting: Physician Assistant

## 2017-05-28 DIAGNOSIS — E042 Nontoxic multinodular goiter: Secondary | ICD-10-CM | POA: Diagnosis present

## 2017-05-28 LAB — CBC WITH DIFFERENTIAL/PLATELET
Basophils Absolute: 0 10*3/uL (ref 0.0–0.1)
Basophils Relative: 0 %
Eosinophils Absolute: 0.2 10*3/uL (ref 0.0–0.7)
Eosinophils Relative: 3 %
HCT: 35 % — ABNORMAL LOW (ref 36.0–46.0)
HEMOGLOBIN: 11.4 g/dL — AB (ref 12.0–15.0)
LYMPHS ABS: 3.8 10*3/uL (ref 0.7–4.0)
LYMPHS PCT: 45 %
MCH: 28.9 pg (ref 26.0–34.0)
MCHC: 32.6 g/dL (ref 30.0–36.0)
MCV: 88.8 fL (ref 78.0–100.0)
MONOS PCT: 8 %
Monocytes Absolute: 0.7 10*3/uL (ref 0.1–1.0)
NEUTROS PCT: 44 %
Neutro Abs: 3.7 10*3/uL (ref 1.7–7.7)
Platelets: 339 10*3/uL (ref 150–400)
RBC: 3.94 MIL/uL (ref 3.87–5.11)
RDW: 13.1 % (ref 11.5–15.5)
WBC: 8.5 10*3/uL (ref 4.0–10.5)

## 2017-05-28 LAB — D-DIMER, QUANTITATIVE: D-Dimer, Quant: 1.05 ug/mL-FEU — ABNORMAL HIGH (ref 0.00–0.50)

## 2017-05-28 LAB — I-STAT TROPONIN, ED: Troponin i, poc: 0 ng/mL (ref 0.00–0.08)

## 2017-05-28 LAB — MAGNESIUM: MAGNESIUM: 1.4 mg/dL — AB (ref 1.7–2.4)

## 2017-05-28 LAB — I-STAT BETA HCG BLOOD, ED (MC, WL, AP ONLY): I-stat hCG, quantitative: <5 m[IU]/mL (ref <5)

## 2017-05-28 MED ORDER — ACETAMINOPHEN 500 MG PO TABS
1000.0000 mg | ORAL_TABLET | Freq: Once | ORAL | Status: AC
  Administered 2017-05-28: 1000 mg via ORAL
  Filled 2017-05-28: qty 2

## 2017-05-28 MED ORDER — MAGNESIUM SULFATE 2 GM/50ML IV SOLN
2.0000 g | Freq: Once | INTRAVENOUS | Status: AC
  Administered 2017-05-28: 2 g via INTRAVENOUS
  Filled 2017-05-28: qty 50

## 2017-05-28 MED ORDER — SODIUM CHLORIDE 0.9 % IV BOLUS
1000.0000 mL | Freq: Once | INTRAVENOUS | Status: AC
  Administered 2017-05-28: 1000 mL via INTRAVENOUS

## 2017-05-28 MED ORDER — IOPAMIDOL (ISOVUE-370) INJECTION 76%
INTRAVENOUS | Status: AC
  Administered 2017-05-28: 100 mL via INTRAVENOUS
  Filled 2017-05-28: qty 100

## 2017-05-28 MED ORDER — HALOPERIDOL LACTATE 2 MG/ML PO CONC
5.0000 mg | Freq: Two times a day (BID) | ORAL | Status: DC
  Administered 2017-05-28 – 2017-05-30 (×4): 5 mg via ORAL
  Filled 2017-05-28 (×8): qty 2.5

## 2017-05-28 MED ORDER — HALOPERIDOL 5 MG PO TABS
5.0000 mg | ORAL_TABLET | Freq: Two times a day (BID) | ORAL | Status: DC
  Filled 2017-05-28 (×4): qty 1

## 2017-05-28 MED ORDER — MAGNESIUM CHLORIDE 64 MG PO TBEC
1.0000 | DELAYED_RELEASE_TABLET | Freq: Every day | ORAL | Status: DC
  Administered 2017-05-28 – 2017-05-31 (×5): 64 mg via ORAL
  Filled 2017-05-28 (×6): qty 1

## 2017-05-28 MED ORDER — POTASSIUM CHLORIDE CRYS ER 20 MEQ PO TBCR
20.0000 meq | EXTENDED_RELEASE_TABLET | Freq: Two times a day (BID) | ORAL | Status: AC
  Administered 2017-05-28 – 2017-05-29 (×4): 20 meq via ORAL
  Filled 2017-05-28 (×6): qty 1

## 2017-05-28 MED ORDER — ACETAMINOPHEN 325 MG PO TABS: 650.0000 mg | ORAL_TABLET | Freq: Once | ORAL | Status: DC

## 2017-05-28 MED ORDER — SODIUM CHLORIDE 0.9 % IJ SOLN
INTRAMUSCULAR | Status: AC
  Filled 2017-05-28: qty 50

## 2017-05-28 MED ORDER — METOPROLOL TARTRATE 25 MG PO TABS
25.0000 mg | ORAL_TABLET | Freq: Once | ORAL | Status: AC
  Administered 2017-05-28: 25 mg via ORAL
  Filled 2017-05-28: qty 1

## 2017-05-28 NOTE — ED Notes (Signed)
Pt states she is unable to stand. Can not provide full set of orthostatic vital signs

## 2017-05-28 NOTE — BHH Group Notes (Signed)
[place] Group Notes:  (Nursing/MHT/Case Management/Adjunct)  Date:  05/28/2017  Time:  11:02 AM  Type of Therapy:  Psychoeducational Skills  Participation Level:  Did Not Attend  Participation Quality:  Did not attend  Affect:  Did not attend  Cognitive:  Did not attend  Insight:  None  Engagement in Group:  Did not attend  Modes of Intervention:  Did not attend  Summary of Progress/Problems: Pt did not attend Psychoeducational group with topic anger management.  Benancio Deeds Shanta 05/28/2017, 11:02 AM

## 2017-05-28 NOTE — ED Notes (Signed)
Pt sts she is unable to give a urine sample at this time 

## 2017-05-28 NOTE — Progress Notes (Signed)
Patient ID: Jenna Hill, female   DOB: 01-25-1971, 47 y.o.   MRN: [id number]    After lunch patient came up to the nursing station and reported that her hip popped out of placed. This writer attempted to inquire about what happened the patient reported that she has a history of Rhabdomyolysis and that this happens all the time. Pt reported that she was also in severe pain. This Probation officer had [name] NP to assess patient and at that time she reported that she fail in her room, then reported that she did not fall and that she caught herself. Pt continued to complain of severe pain, [name] NP then gave orders for patient to go to the ED. Report was called to charge nurse [name] at [facility].

## 2017-05-28 NOTE — Progress Notes (Signed)
Nursing 1:1 Note  D: Pt just returned from [facility].Pt requested snacks/fluids/medications. No distress noted. 1:1 ordered for Pt's safety. Pt is a high risk for falls.  A: 1:1 observation continues for safety. Sitter within arms reach.  R: Pt remains safe.

## 2017-05-28 NOTE — BHH Group Notes (Signed)
[place] Group Notes: (Clinical Social Work)   05/28/2017      Type of Therapy:  Group Therapy   Participation Level:  Did Not Attend despite MHT prompting   Selmer Dominion, LCSW 05/28/2017, 12:25 PM
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
## 2017-05-28 NOTE — ED Notes (Signed)
Report to [facility] for need to transport patient back to [facility]

## 2017-05-28 NOTE — ED Notes (Signed)
No respiratory or acute distress noted alert and oriented x 3 BH sitter with pt.

## 2017-05-28 NOTE — Progress Notes (Signed)
Patient ID: Jenna Hill, female   DOB: 07-26-1970, 47 y.o.   MRN: [id number]    D: Pt has been very flat and depressed on the unit today. Pt has also been very isolative, she did not attend any groups nor did she engage in any treatment. Pt refused to fill out her patient self inventory sheet, she reported no issuest. Pt did take all medications as prescribed by the doctor, no issues or concerns noted. Pt reported being negative SI/HI, no AH/VH noted. A: 15 min checks continued for patient safety. R: Pt safety maintained.

## 2017-05-28 NOTE — ED Notes (Signed)
Pt sent from [facility] due to conflicting stories regarding possible fall or near fall, maybe ankle hip injury, maybe not. Pt is able to bear weight and was reported she was walking with walker.Poor historian and as stated difficult to determine if any injury

## 2017-05-28 NOTE — [facility] Group Notes (Signed)
[facility] Group Notes:  (Nursing/MHT/Case Management/Adjunct)  Date:  05/28/2017  Time:  10:56 AM  Type of Therapy:  Orientation/Goals group  Participation Level:  Did Not Attend  Participation Quality:  Did Not Attend  Affect:  Did Not Attend  Cognitive:  Did Not Attend  Insight:  None  Engagement in Group:  Did Not Attend  Modes of Intervention:  Did Not Attend  Summary of Progress/Problems: Pt did not attend patient self inventory group/orientation group.  Jenna Hill 05/28/2017, 10:56 AM

## 2017-05-28 NOTE — Progress Notes (Signed)
[facility] MD Progress Note  05/28/2017 10:39 AM Jenna Hill  MRN:  [id number] Subjective:    Jenna Hill is a 47 y/o F with history of schizoaffective disorder, bipolar type who was admitted from [place] on IVC after she presented brought in by EMS when she was found down by a friend in her apartment and only responsive to pain from an apparent overdose of her prescription medications. Pt reported she was unsure what she had taken and her intention was not to overdose. She was found with prescriptions near here of abilify, amiodarone, klonopin, doxepin, metoprolol, seroquel, and clozapine. Pt has recent history of admission to [facility] from 04/05/17 - 05/12/17 for similar presentation and treatment for overdose of her medications when she was found down which required stabilization via intubation and hemodialysis. Pt was placed on IVC after medical stabilization and she was transferred to [facility] for additional treatment and stabilization. As per chart review pt had been undergoing medication changes with her outpatient provider and transitioning from [place] to clozapine.Upon admission, pt had multiple medications discontinued due to concern for over-sedation (including lunesta, seroquel, klonopin, and doxepin), and her regimen was consolidated to clozapine at starting dose with plan to titrate up the dose. PT was consulted to follow patient to assist with building strength and ambulation. Yesterday, pt had tachycardia with rate in the 120's-130's. EKG was ordered with pulse result of 124 and QTc of 589. Pt was sent to ED for stat lab of CMP to check potassium and magnesium. Her K was 3.3 and Mg was 1.4. Clozapine was discontinued due to concern of QTc prolongation.  Prior to interview today, EKG was repeated and revealed QTc of 472 with normal sinus rhythm.  Upon interview today, pt shares, "I'm okay." She denies any specific concerns today. She is sleeping well. Her appetite is good. She  reports ongoing AH which have improved during her stay. She notes, "They're always with me." Pt rates intensity of AH as "7/10" as compared to "10/10" at time of admission. She continues to respond at times to the voices during the interview. She denies SI/HI/AH/VH. Discussed with patient about need for change from clozapine to an alternative agent due to QTc prolongation, and pt verbalized good understanding. She is in agreement to trial of haldol with possibility of adding a second antipsychotic agent, if necessary. We will also supplement her with magnesium and potassium as per her CMP from yesterday and recheck CMP on 4/29 AM. Pt was in agreement with the above plan, and she had no further questions, comments, or concerns.   Principal Problem: Schizoaffective disorder, bipolar type ([place]) Diagnosis:   Patient Active Problem List   Diagnosis Date Noted  . Sinus tachycardia [R00.0] 05/20/2017  . Overdose [T50.901A] 05/20/2017  . Chronic diastolic CHF (congestive heart failure) ([place]) [I50.32] 05/20/2017  . Injury of right sciatic nerve [S74.01XA] 04/29/2017  . AKI (acute kidney injury) ([place]) [N17.9] 04/25/2017  . Shock liver [K72.00] 04/10/2017  . Rhabdomyolysis [M62.82] 04/10/2017  . Bipolar 1 disorder ([place]) [F31.9] 04/10/2017  . Acute respiratory failure ([place]) [J96.00]   . Acute encephalopathy [G93.40] 04/05/2017  . Pituitary microadenoma ([place]) [D35.2] 04/25/2015  . OSA (obstructive sleep apnea) [G47.33] 04/16/2015  . Obesity [E66.9] 04/16/2015  . Schizoaffective disorder, bipolar type ([place]) [F25.0] 04/15/2015  . Hypertension [I10] 10/05/2012   Total Time spent with patient: 30 minutes  Past Psychiatric History: see H&P  Past Medical History:  Past Medical History:  Diagnosis Date  . Bipolar 1  disorder ([place])   . Difficult intubation   . Hypertension   . PONV (postoperative nausea and vomiting)   . Schizophrenia ([place])   . Spinal headache     Past Surgical History:  Procedure  Laterality Date  . ANKLE ARTHROSCOPY Right   . CESAREAN SECTION    . HAND SURGERY Right   . ORIF ANKLE FRACTURE Left 01/29/2016   Procedure: OPEN REDUCTION INTERNAL FIXATION LEFT ANKLE BIMALLEOLAR FRACTURE AND SYNDESMOSIS;  Surgeon: Wylene Simmer, MD;  Location: [place];  Service: Orthopedics;  Laterality: Left;   Family History:  Family History  Problem Relation Age of Onset  . Healthy Mother   . Healthy Father   . Healthy Sister    Family Psychiatric  History: see H&P Social History:  Social History   Substance and Sexual Activity  Alcohol Use Yes   Comment: Denies     Social History   Substance and Sexual Activity  Drug Use No   Comment: Denies    Social History   Socioeconomic History  . Marital status: Married    Spouse name: Not on file  . Number of children: Not on file  . Years of education: Not on file  . Highest education level: Not on file  Occupational History  . Not on file  Social Needs  . Financial resource strain: Not on file  . Food insecurity:    Worry: Not on file    Inability: Not on file  . Transportation needs:    Medical: Not on file    Non-medical: Not on file  Tobacco Use  . Smoking status: Never Smoker  . Smokeless tobacco: Never Used  Substance and Sexual Activity  . Alcohol use: Yes    Comment: Denies  . Drug use: No    Comment: Denies  . Sexual activity: Not Currently    Birth control/protection: None  Lifestyle  . Physical activity:    Days per week: Not on file    Minutes per session: Not on file  . Stress: Not on file  Relationships  . Social connections:    Talks on phone: Not on file    Gets together: Not on file    Attends religious service: Not on file    Active member of club or organization: Not on file    Attends meetings of clubs or organizations: Not on file    Relationship status: Not on file  Other Topics Concern  . Not on file  Social History Narrative  . Not on file   Additional  Social History:                         Sleep: Good  Appetite:  Good  Current Medications: Current Facility-Administered Medications  Medication Dose Route Frequency Provider Last Rate Last Dose  . acetaminophen (TYLENOL) tablet 650 mg  650 mg Oral Q6H PRN Sharma Covert, MD   650 mg at 05/28/17 7989  . alum & mag hydroxide-simeth (MAALOX/MYLANTA) 200-200-20 MG/5ML suspension 30 mL  30 mL Oral Q4H PRN Money, [name] B, FNP      . amLODipine (NORVASC) tablet 10 mg  10 mg Oral Daily Pennelope Bracken, MD   10 mg at 05/27/17 1704  . haloperidol (HALDOL) tablet 5 mg  5 mg Oral Q6H PRN Money, Lowry Ram, FNP   5 mg at 05/28/17 2119   Or  . haloperidol lactate (HALDOL) injection 5 mg  5 mg Intramuscular  Q6H PRN Money, [name] B, FNP      . haloperidol (HALDOL) tablet 5 mg  5 mg Oral BID Maris Berger T, MD      . hydrOXYzine (ATARAX/VISTARIL) tablet 50 mg  50 mg Oral Q6H PRN Pennelope Bracken, MD   50 mg at 05/28/17 6301  . loratadine (CLARITIN) tablet 10 mg  10 mg Oral Daily Pennelope Bracken, MD   10 mg at 05/28/17 6010  . magnesium chloride (SLOW-MAG) 64 MG SR tablet 64 mg  1 tablet Oral Daily Alivea Gladson T, MD      . magnesium hydroxide (MILK OF MAGNESIA) suspension 30 mL  30 mL Oral Daily PRN Money, Lowry Ram, FNP      . potassium chloride SA (K-DUR,KLOR-CON) CR tablet 20 mEq  20 mEq Oral BID Pennelope Bracken, MD      . traZODone (DESYREL) tablet 100 mg  100 mg Oral QHS PRN,MR X 1 Lindon Romp A, NP   100 mg at 05/27/17 2111    Lab Results:  Results for orders placed or performed during the hospital encounter of 05/24/17 (from the past 48 hour(s))  Comprehensive metabolic panel     Status: Abnormal   Collection Time: 05/27/17 11:10 AM  Result Value Ref Range   Sodium 140 135 - 145 mmol/L   Potassium 3.3 (L) 3.5 - 5.1 mmol/L   Chloride 106 101 - 111 mmol/L   CO2 23 22 - 32 mmol/L   Glucose, Bld 111 (H) 65 - 99 mg/dL   BUN 13 6  - 20 mg/dL   Creatinine, Ser 1.17 (H) 0.44 - 1.00 mg/dL   Calcium 9.4 8.9 - 10.3 mg/dL   Total Protein 8.3 (H) 6.5 - 8.1 g/dL   Albumin 4.0 3.5 - 5.0 g/dL   AST 19 15 - 41 U/L   ALT 20 14 - 54 U/L   Alkaline Phosphatase 56 38 - 126 U/L   Total Bilirubin 0.5 0.3 - 1.2 mg/dL   GFR calc non Af Amer 55 (L) >60 mL/min   GFR calc Af Amer >60 >60 mL/min    Comment: (NOTE) The eGFR has been calculated using the CKD EPI equation. This calculation has not been validated in all clinical situations. eGFR's persistently <60 mL/min signify possible Chronic Kidney Disease.    Anion gap 11 5 - 15    Comment: Performed at [facility], [place] [address] [place], [place] [address]  Magnesium     Status: Abnormal   Collection Time: 05/27/17 11:10 AM  Result Value Ref Range   Magnesium 1.4 (L) 1.7 - 2.4 mg/dL    Comment: Performed at [facility], [place] [address], [place], [place] [address]    Blood Alcohol level:  Lab Results  Component Value Date   ETH <10 05/20/2017   ETH <10 02/54/2706    Metabolic Disorder Labs: Lab Results  Component Value Date   HGBA1C 6.9 (H) 04/05/2017   MPG 151.33 04/05/2017   Lab Results  Component Value Date   PROLACTIN 291.8 (H) 04/17/2015   Lab Results  Component Value Date   CHOL 147 04/17/2015   TRIG 259 (H) 04/18/2017   HDL 52 04/17/2015   CHOLHDL 2.8 04/17/2015   VLDL 14 04/17/2015   LDLCALC 81 04/17/2015    Physical Findings: AIMS: Facial and Oral Movements Muscles of Facial Expression: None, normal Lips and Perioral Area: None, normal Jaw: None, normal Tongue: None, normal,Extremity Movements Upper (arms, wrists, hands, fingers):  None, normal Lower (legs, knees, ankles, toes): None, normal, Trunk Movements Neck, shoulders, hips: None, normal, Overall Severity Severity of abnormal movements (highest score from questions above): None, normal Incapacitation due to abnormal movements: None,  normal Patient's awareness of abnormal movements (rate only patient's report): No Awareness, Dental Status Current problems with teeth and/or dentures?: No Does patient usually wear dentures?: No  CIWA:    COWS:     Musculoskeletal: Strength & Muscle Tone: within normal limits Gait & Station: normal Patient leans: N/A  Psychiatric Specialty Exam: Physical Exam  Nursing note and vitals reviewed.   Review of Systems  Constitutional: Negative for chills and fever.  Respiratory: Negative for cough and shortness of breath.   Cardiovascular: Negative for chest pain.  Gastrointestinal: Negative for abdominal pain, heartburn, nausea and vomiting.  Psychiatric/Behavioral: Positive for hallucinations. Negative for depression and suicidal ideas. The patient is not nervous/anxious and does not have insomnia.     Blood pressure 139/76, pulse (!) 125, temperature 98.2 F (36.8 C), temperature source Oral, resp. rate 18, height 5' (1.524 m), weight 97.5 kg (215 lb), last menstrual period 05/15/2017, SpO2 100 %.Body mass index is 41.99 kg/m.  General Appearance: Casual and Fairly Groomed  Eye Contact:  Good  Speech:  Clear and Coherent and Normal Rate  Volume:  Normal  Mood:  Euthymic  Affect:  Congruent and Flat  Thought Process:  Coherent and Goal Directed  Orientation:  Full (Time, Place, and Person)  Thought Content:  Hallucinations: Auditory  Suicidal Thoughts:  No  Homicidal Thoughts:  No  Memory:  Immediate;   Fair Recent;   Fair Remote;   Fair  Judgement:  Fair  Insight:  Fair  Psychomotor Activity:  Normal  Concentration:  Concentration: Fair  Recall:  [employer] of Knowledge:  Fair  Language:  Fair  Akathisia:  No  Handed:    AIMS (if indicated):     Assets:  Communication Skills Resilience Social Support  ADL's:  Intact  Cognition:  WNL  Sleep:  Number of Hours: 5.75   Treatment Plan Summary: Daily contact with patient to assess and evaluate symptoms and progress  in treatment and Medication management   -Continue inpatient hospitalization  -Schizoaffective disorder, bipolar type -Discontinue clozapine (had been DC'd yesterday in context of QTc prologation)   -Start haldol 74m po BID  -Hypokalemia   - Start potassium chloride 225m BID for 4 doses (then recheck CMP on 4/29 AM)  -Hypomagnesemia   -Start magnesium chloride SR 6468mo qDay  (will recheck CMP on 4/29 AM)  -HTN -Continue norvasc 43m13m qDay  -Seasonal allergies -Continue claritin 43mg26mqDay  -Anxiety -Continue atarax 50mg 72m6h prn anxiety  -Insomnia -Continue trazodone 50mg p62ms prn insomnia (may repeat x1)  - Agitation -Continue agitation protocol with haldol 5mg po/1mq6h prn agitation/psychosis  -Encourage participation in groups and therapeutic milieu  -Disposition planning will be ongoing  ChristopPennelope Bracken7/2019, 10:39 AM

## 2017-05-28 NOTE — ED Notes (Signed)
Bed: PX10 Expected date: 05/28/17 Expected time: 1:51 PM Means of arrival: Other Comments: Pain in hip and ankle

## 2017-05-28 NOTE — ED Notes (Signed)
Pt provided with sandwich, cheese stick and apple juice per request

## 2017-05-28 NOTE — ED Notes (Signed)
Report to [facility] to go back to 506 bed 2

## 2017-05-29 DIAGNOSIS — I10 Essential (primary) hypertension: Secondary | ICD-10-CM

## 2017-05-29 DIAGNOSIS — R451 Restlessness and agitation: Secondary | ICD-10-CM

## 2017-05-29 DIAGNOSIS — E876 Hypokalemia: Secondary | ICD-10-CM

## 2017-05-29 DIAGNOSIS — Z915 Personal history of self-harm: Secondary | ICD-10-CM

## 2017-05-29 NOTE — Progress Notes (Signed)
Patient ID: Jenna Hill, female   DOB: 29-Apr-1970, 47 y.o.   MRN: [id number]    1:1 Note  D: Pt sitting in wheelchair at the end of the hall with sitter looking out window. Pt reported that she was feeling better today, but was still in pain. Pt remained very unsteady on her feet and when attempting to stand up she was very unsteady. A: 1:1 continued for patient safety. R: Pt safety maintained.

## 2017-05-29 NOTE — Progress Notes (Signed)
[facility] MD Progress Note  05/29/2017 3:33 PM Jenna Hill  MRN:  [id number]  Subjective: Jenna Hill reports, "I'm doing alright. I'm doing well on the medicines & they are helping. I sleep well at night. But, I'm still hearing voices. They came in different languages. I have been having difficulty walking since last month (March). I am working with the physical therapist. I got nerves problems".  Jenna Hill is a 47 y/o F with history of schizoaffective disorder, bipolar type who was admitted from [facility] on IVC after she presented brought in by EMS when she was found down by a friend in her apartment and only responsive to pain from an apparent overdose of her prescription medications. Pt reported she was unsure what she had taken and her intention was not to overdose. She was found with prescriptions near here of abilify, amiodarone, klonopin, doxepin, metoprolol, seroquel, and clozapine. Pt has recent history of admission to [facility] from 04/05/17 - 05/12/17 for similar presentation and treatment for overdose of her medications when she was found down which required stabilization via intubation and hemodialysis. Pt was placed on IVC after medical stabilization and she was transferred to [facility] for additional treatment and stabilization. As per chart review pt had been undergoing medication changes with her outpatient provider and transitioning from [place] to clozapine.Upon admission, pt had multiple medications discontinued due to concern for over-sedation (including lunesta, seroquel, klonopin, and doxepin), and her regimen was consolidated to clozapine at starting dose with plan to titrate up the dose. PT was consulted to follow patient to assist with building strength and ambulation. Yesterday, pt had tachycardia with rate in the 120's-130's. EKG was ordered with pulse result of 124 and QTc of 589. Pt was sent to ED for stat lab of CMP to check potassium and magnesium. Her K was 3.3 and Mg was  1.4. Clozapine was discontinued due to concern of QTc prolongation.  Prior to interview today, EKG was repeated and revealed QTc of 472 with normal sinus rhythm.  Upon interview today 05-29-17, pt shares, "I'm okay." She denies any specific concerns today. She is sleeping well. Her appetite is good. She reports ongoing AH which she says comes in different languages. She continues to respond to the voices from time to time during this follow-up care assessment. She denies SI/HI/AH/VH. The attending psychiatrist had discussed with patient about the need to change from clozapine to an alternative agent due to QTc prolongation, and pt verbalized good understanding. She is in agreement to trial of haldol with possibility of adding a second antipsychotic agent, if necessary. We also supplemented her with magnesium and potassium as per her CMP result from 2 days ago and will recheck CMP on 4/29 AM. Pt remains in agreement with the above plan, and she had no further questions, comments, or concerns. She is currently is being evaluated by a physical therapist. She does have a walker now. She is also using a wheelchair & remains on the 1:1 supervision for safety.  Principal Problem: Schizoaffective disorder, bipolar type ([place]) Diagnosis:   Patient Active Problem List   Diagnosis Date Noted  . Multiple thyroid nodules [E04.2] 05/28/2017  . Sinus tachycardia [R00.0] 05/20/2017  . Overdose [T50.901A] 05/20/2017  . Chronic diastolic CHF (congestive heart failure) ([place]) [I50.32] 05/20/2017  . Injury of right sciatic nerve [S74.01XA] 04/29/2017  . AKI (acute kidney injury) ([place]) [N17.9] 04/25/2017  . Shock liver [K72.00] 04/10/2017  . Rhabdomyolysis [M62.82] 04/10/2017  . Bipolar 1 disorder ([place]) [F31.9] 04/10/2017  . Acute respiratory failure ([place]) [J96.00]   . Acute encephalopathy  [G93.40] 04/05/2017  . Pituitary microadenoma ([place]) [D35.2] 04/25/2015  . OSA (obstructive sleep apnea) [G47.33] 04/16/2015  . Obesity [E66.9] 04/16/2015  . Schizoaffective disorder, bipolar type ([place]) [F25.0] 04/15/2015  . Hypertension [I10] 10/05/2012   Total Time spent with patient: 15 minutes  Past Psychiatric History: See H&P  Past Medical History:  Past Medical History:  Diagnosis Date  . Bipolar 1 disorder ([place])   . Difficult intubation   . Hypertension   . PONV (postoperative nausea and vomiting)   . Schizophrenia ([place])   . Spinal headache     Past Surgical History:  Procedure Laterality Date  . ANKLE ARTHROSCOPY Right   . CESAREAN SECTION    . HAND SURGERY Right   . ORIF ANKLE FRACTURE Left 01/29/2016   Procedure: OPEN REDUCTION INTERNAL FIXATION LEFT ANKLE BIMALLEOLAR FRACTURE AND SYNDESMOSIS;  Surgeon: Wylene Simmer, MD;  Location: [facility];  Service: Orthopedics;  Laterality: Left;   Family History:  Family History  Problem Relation Age of Onset  . Healthy Mother   . Healthy Father   . Healthy Sister    Family Psychiatric  History: See H&P Social History:  Social History   Substance and Sexual Activity  Alcohol Use Yes   Comment: Denies     Social History   Substance and Sexual Activity  Drug Use No   Comment: Denies    Social History   Socioeconomic History  . Marital status: Married    Spouse name: Not on file  . Number of children: Not on file  . Years of education: Not on file  . Highest education level: Not on file  Occupational History  . Not on file  Social Needs  . Financial resource strain: Not on file  . Food insecurity:    Worry: Not on file    Inability: Not on file  . Transportation needs:    Medical: Not on file    Non-medical: Not on file  Tobacco Use  . Smoking status: Never Smoker  . Smokeless tobacco: Never Used  Substance and Sexual Activity  . Alcohol use: Yes    Comment: Denies  . Drug use: No     Comment: Denies  . Sexual activity: Not Currently    Birth control/protection: None  Lifestyle  . Physical activity:    Days per week: Not on file    Minutes per session: Not on file  . Stress: Not on file  Relationships  . Social connections:    Talks on phone: Not on file    Gets together: Not on file    Attends religious service: Not on file    Active member of club or organization: Not on file    Attends meetings of clubs or organizations: Not on file    Relationship status: Not on file  Other Topics Concern  . Not on file  Social History Narrative  . Not on file  Additional Social History:   Sleep: Good  Appetite:  Good  Current Medications: Current Facility-Administered Medications  Medication Dose Route Frequency Provider Last Rate Last Dose  . acetaminophen (TYLENOL) tablet 650 mg  650 mg Oral Q6H PRN Sharma Covert, MD   650 mg at 05/28/17 8756  . alum & mag hydroxide-simeth (MAALOX/MYLANTA) 200-200-20 MG/5ML suspension 30 mL  30 mL Oral Q4H PRN Money, [name] B, FNP      . amLODipine (NORVASC) tablet 10 mg  10 mg Oral Daily Maris Berger T, MD   10 mg at 05/28/17 1713  . haloperidol (HALDOL) 2 MG/ML solution 5 mg  5 mg Oral BID Buford Dresser J, DO   5 mg at 05/29/17 1005  . haloperidol (HALDOL) tablet 5 mg  5 mg Oral Q6H PRN Money, Lowry Ram, FNP   5 mg at 05/29/17 1004   Or  . haloperidol lactate (HALDOL) injection 5 mg  5 mg Intramuscular Q6H PRN Money, Lowry Ram, FNP      . hydrOXYzine (ATARAX/VISTARIL) tablet 50 mg  50 mg Oral Q6H PRN Pennelope Bracken, MD   50 mg at 05/28/17 2352  . loratadine (CLARITIN) tablet 10 mg  10 mg Oral Daily Pennelope Bracken, MD   10 mg at 05/29/17 1003  . magnesium chloride (SLOW-MAG) 64 MG SR tablet 64 mg  1 tablet Oral Daily Pennelope Bracken, MD   64 mg at 05/29/17 1003  . magnesium hydroxide (MILK OF MAGNESIA) suspension 30 mL  30 mL Oral Daily PRN Money, Lowry Ram, FNP      . potassium  chloride SA (K-DUR,KLOR-CON) CR tablet 20 mEq  20 mEq Oral BID Pennelope Bracken, MD   20 mEq at 05/29/17 1003  . traZODone (DESYREL) tablet 100 mg  100 mg Oral QHS PRN,MR X 1 Lindon Romp A, NP   100 mg at 05/28/17 2352   Lab Results:  Results for orders placed or performed during the hospital encounter of 05/24/17 (from the past 48 hour(s))  Magnesium     Status: Abnormal   Collection Time: 05/28/17  3:00 PM  Result Value Ref Range   Magnesium 1.4 (L) 1.7 - 2.4 mg/dL    Comment: Performed at [facility], [place] [address] [place], [place] [address]  D-dimer, quantitative (not at [facility])     Status: Abnormal   Collection Time: 05/28/17  3:00 PM  Result Value Ref Range   D-Dimer, Quant 1.05 (H) 0.00 - 0.50 ug/mL-FEU    Comment: (NOTE) At the manufacturer cut-off of 0.50 ug/mL FEU, this assay has been documented to exclude PE with a sensitivity and negative predictive value of 97 to 99%.  At this time, this assay has not been approved by the FDA to exclude DVT/VTE. Results should be correlated with clinical presentation. Performed at [facility], [place] [address], [place], [place] [address]   I-stat troponin, ED     Status: None   Collection Time: 05/28/17  3:06 PM  Result Value Ref Range   Troponin i, poc 0.00 0.00 - 0.08 ng/mL   Comment 3            Comment: Due to the release kinetics of cTnI, a negative result within the first hours of the onset of symptoms does not rule out myocardial infarction with certainty. If myocardial infarction is still suspected, repeat the test at appropriate intervals.   I-Stat beta hCG blood, ED     Status: None  Collection Time: 05/28/17  3:06 PM  Result Value Ref Range   I-stat hCG, quantitative <5.0 <5 mIU/mL   Comment 3            Comment:   GEST. AGE      CONC.  (mIU/mL)   <=1 WEEK        5 - 50     2 WEEKS       50 - 500     3 WEEKS       100 - 10,000     4 WEEKS     1,000 - 30,000         FEMALE AND NON-PREGNANT FEMALE:     LESS THAN 5 mIU/mL   CBC with Differential     Status: Abnormal   Collection Time: 05/28/17  7:39 PM  Result Value Ref Range   WBC 8.5 4.0 - 10.5 K/uL   RBC 3.94 3.87 - 5.11 MIL/uL   Hemoglobin 11.4 (L) 12.0 - 15.0 g/dL   HCT 35.0 (L) 36.0 - 46.0 %   MCV 88.8 78.0 - 100.0 fL   MCH 28.9 26.0 - 34.0 pg   MCHC 32.6 30.0 - 36.0 g/dL   RDW 13.1 11.5 - 15.5 %   Platelets 339 150 - 400 K/uL   Neutrophils Relative % 44 %   Neutro Abs 3.7 1.7 - 7.7 K/uL   Lymphocytes Relative 45 %   Lymphs Abs 3.8 0.7 - 4.0 K/uL   Monocytes Relative 8 %   Monocytes Absolute 0.7 0.1 - 1.0 K/uL   Eosinophils Relative 3 %   Eosinophils Absolute 0.2 0.0 - 0.7 K/uL   Basophils Relative 0 %   Basophils Absolute 0.0 0.0 - 0.1 K/uL    Comment: Performed at [facility], [place] [address], [place], [place] [address]   Blood Alcohol level:  Lab Results  Component Value Date   ETH <10 05/20/2017   ETH <10 12/45/8099    Metabolic Disorder Labs: Lab Results  Component Value Date   HGBA1C 6.9 (H) 04/05/2017   MPG 151.33 04/05/2017   Lab Results  Component Value Date   PROLACTIN 291.8 (H) 04/17/2015   Lab Results  Component Value Date   CHOL 147 04/17/2015   TRIG 259 (H) 04/18/2017   HDL 52 04/17/2015   CHOLHDL 2.8 04/17/2015   VLDL 14 04/17/2015   LDLCALC 81 04/17/2015   Physical Findings: AIMS: Facial and Oral Movements Muscles of Facial Expression: None, normal Lips and Perioral Area: None, normal Jaw: None, normal Tongue: None, normal,Extremity Movements Upper (arms, wrists, hands, fingers): None, normal Lower (legs, knees, ankles, toes): None, normal, Trunk Movements Neck, shoulders, hips: None, normal, Overall Severity Severity of abnormal movements (highest score from questions above): None, normal Incapacitation due to abnormal movements: None, normal Patient's awareness of abnormal movements (rate only patient's report): No  Awareness, Dental Status Current problems with teeth and/or dentures?: No Does patient usually wear dentures?: No  CIWA:    COWS:     Musculoskeletal: Strength & Muscle Tone: within normal limits Gait & Station: normal Patient leans: N/A  Psychiatric Specialty Exam: Physical Exam  Nursing note and vitals reviewed.   Review of Systems  Constitutional: Negative for chills and fever.  Respiratory: Negative for cough and shortness of breath.   Cardiovascular: Negative for chest pain.  Gastrointestinal: Negative for abdominal pain, heartburn, nausea and vomiting.  Psychiatric/Behavioral: Positive for hallucinations. Negative for depression and suicidal ideas. The patient is not nervous/anxious  and does not have insomnia.     Blood pressure (!) 139/97, pulse (!) 112, temperature 98.2 F (36.8 C), temperature source Oral, resp. rate 20, height 5' (1.524 m), weight 97.5 kg (215 lb), last menstrual period 05/15/2017, SpO2 97 %.Body mass index is 41.99 kg/m.  General Appearance: Casual and Fairly Groomed  Eye Contact:  Good  Speech:  Clear and Coherent and Normal Rate  Volume:  Normal  Mood:  Euthymic  Affect:  Congruent and Flat  Thought Process:  Coherent and Goal Directed  Orientation:  Full (Time, Place, and Person)  Thought Content:  Hallucinations: Auditory  Suicidal Thoughts:  No  Homicidal Thoughts:  No  Memory:  Immediate;   Fair Recent;   Fair Remote;   Fair  Judgement:  Fair  Insight:  Fair  Psychomotor Activity:  Normal  Concentration:  Concentration: Fair  Recall:  [employer] of Knowledge:  Fair  Language:  Fair  Akathisia:  No  Handed:    AIMS (if indicated):     Assets:  Communication Skills Resilience Social Support  ADL's:  Intact  Cognition:  WNL  Sleep:  Number of Hours: 4   Treatment Plan Summary: Daily contact with patient to assess and evaluate symptoms and progress in treatment and Medication management   - Continue inpatient  hospitalization.  - Will continue today 05/29/2017 plan as below except where it is noted.  -Schizoaffective disorder, bipolar type -Discontinued clozapine (had been DC'd yesterday in context of QTc prologation)   -Continue haldol 5mg  po BID  -Hypokalemia   - Continue potassium chloride 61mEq BID for 4 doses (then recheck CMP on 4/29 AM)  -Hypomagnesemia   - Continue  magnesium chloride SR 64mg  po qDay  (will recheck CMP on 4/29 AM)  -HTN -Continue norvasc 10mg  po qDay  -Seasonal allergies -Continue claritin 10mg  po qDay  -Anxiety -Continue atarax 50mg  po q6h prn anxiety  -Insomnia -Continue trazodone 50mg  po qhs prn insomnia (may repeat x1)  - Agitation -Continue agitation protocol with haldol 5mg  po/IM q6h prn agitation/psychosis  -Encourage participation in groups and therapeutic milieu  -Disposition planning will be ongoing  Lindell Spar, NP, pmhnp, fnp-bc. 05/29/2017, 3:33 PMPatient ID: Jenna Hill, female   DOB: 1970/06/12, 47 y.o.   MRN: [id number]

## 2017-05-29 NOTE — BHH Group Notes (Signed)
[facility] Group Notes:  (Nursing/MHT/Case Management/Adjunct)  Date:  05/29/2017  Time:  11:29 AM  Type of Therapy:  Orientation/Goals group  Participation Level:  Did Not Attend  Participation Quality:  Did Not Attend  Affect:  Did Not Attend  Cognitive:  Did Not Attend  Insight:  None  Engagement in Group:  Did Not Attend  Modes of Intervention:  Did Not Attend  Summary of Progress/Problems: Pt did not attend patient self inventory group/orientation group.  Benancio Deeds Shanta 05/29/2017, 11:29 AM

## 2017-05-29 NOTE — Progress Notes (Signed)
1:1 Progress Note D: Pt currently resting in bed. Patient appropriate to situation. Pt in no current distress.  A: Sitter is currently at bedside. R: Pt remains safe on a 1:1 per MD orders.

## 2017-05-29 NOTE — BHH Group Notes (Signed)
[facility] Group Notes:  (Nursing/MHT/Case Management/Adjunct)  Date:  05/29/2017  Time:  11:30 AM  Type of Therapy:  Psychoeducational Skills  Participation Level:  Did Not Attend  Participation Quality:  Did not attend  Affect:  Did not attend  Cognitive:  Did not attend  Insight:  None  Engagement in Group:  Did not attend  Modes of Intervention:  Did not attend  Summary of Progress/Problems: Pt did not attend Psychoeducational group with topic healthy support systems.  Benancio Deeds Shanta 05/29/2017, 11:30 AM

## 2017-05-29 NOTE — BHH Group Notes (Signed)
[facility] LCSW Group Therapy Note  Date/Time:  05/29/2017  11:00AM-12:00PM  Type of Therapy and Topic:  Group Therapy:  Music and Mood  Participation Level:  Did Not Attend   Description of Group: In this process group, members listened to a variety of genres of music and identified that different types of music evoke different responses.  Patients were encouraged to identify music that was soothing for them and music that was energizing for them.  Patients discussed how this knowledge can help with wellness and recovery in various ways including managing depression and anxiety as well as encouraging healthy sleep habits.    Therapeutic Goals: 1. Patients will explore the impact of different varieties of music on mood 2. Patients will verbalize the thoughts they have when listening to different types of music 3. Patients will identify music that is soothing to them as well as music that is energizing to them 4. Patients will discuss how to use this knowledge to assist in maintaining wellness and recovery 5. Patients will explore the use of music as a coping skill  Summary of Patient Progress:  N/A  Therapeutic Modalities: Solution Focused Brief Therapy Activity   Selmer Dominion, LCSW

## 2017-05-29 NOTE — Progress Notes (Signed)
Nursing 1:1Note  D: Pt is observed resting in bed with eyes closed. Respirations even and unlabored. No distress noted.  A: 1:1 observation continues for safety. Sitter within arms reach. R:Pt remains safe.

## 2017-05-29 NOTE — Progress Notes (Signed)
Patient ID: Jenna Hill, female   DOB: 1970-06-20, 47 y.o.   MRN: [id number]    D:Pt has been very flat and depressed on the unit today. Pt has also been very isolative, she did not attend any groups nor did she engage in any treatment. Pt refused to fill out her patient self inventory sheet, she reported no issues. Pt did take all medications as prescribed by the doctor, no issues or concerns noted.Pt reported being negative SI/HI, no AH/VH noted. A: 1:1 continued for patient safety. R: Pt safety maintained.

## 2017-05-29 NOTE — Progress Notes (Signed)
Patient ID: Jenna Hill, female   DOB: 03/08/1970, 47 y.o.   MRN: [id number]   1:1 Note  D: Pt in the bed resting, no issues or concerns noted. No physical distress noted. A: 1:1 continued for patient safety. R: Pt safety maintained.

## 2017-05-29 NOTE — Progress Notes (Signed)
Nursing 1:1 Note  D: Pt is observed resting in bed with eyes closed. Respirations even and unlabored. No distress noted.   A: 1:1 observation continues for safety. Sitter within arms reach.  R: Pt remains safe.

## 2017-05-29 NOTE — Progress Notes (Signed)
Nursing Progress Note: 7p-7a D: Pt currently presents with a minimal/depressed/anxious/isolative affect and behavior. Interacting minimally/isolative with the milieu. Pt reports fair sleep during the previous night with current medication regimen. Pt did not attend wrap-up group.  A: Pt provided with medications per providers orders. Pt's labs and vitals were monitored throughout the night. Pt supported emotionally and encouraged to express concerns and questions. Pt educated on medications.  R: Pt's safety ensured with 15 minute and environmental checks. Pt currently denies SI, HI, and AVH. Pt verbally contracts to seek staff if SI,HI, or AVH occurs and to consult with staff before acting on any harmful thoughts. Will continue to monitor.

## 2017-05-30 LAB — COMPREHENSIVE METABOLIC PANEL
ALK PHOS: 55 U/L (ref 38–126)
ALT: 19 U/L (ref 14–54)
AST: 18 U/L (ref 15–41)
Albumin: 3.8 g/dL (ref 3.5–5.0)
Anion gap: 10 (ref 5–15)
BILIRUBIN TOTAL: 0.5 mg/dL (ref 0.3–1.2)
BUN: 15 mg/dL (ref 6–20)
CALCIUM: 9.5 mg/dL (ref 8.9–10.3)
CO2: 22 mmol/L (ref 22–32)
Chloride: 107 mmol/L (ref 101–111)
Creatinine, Ser: 1.05 mg/dL — ABNORMAL HIGH (ref 0.44–1.00)
GFR calc non Af Amer: >60 mL/min (ref >60)
GLUCOSE: 140 mg/dL — AB (ref 65–99)
Potassium: 4 mmol/L (ref 3.5–5.1)
SODIUM: 139 mmol/L (ref 135–145)
TOTAL PROTEIN: 7.9 g/dL (ref 6.5–8.1)

## 2017-05-30 MED ORDER — HALOPERIDOL 5 MG PO TABS
5.0000 mg | ORAL_TABLET | ORAL | Status: DC
  Administered 2017-05-31: 5 mg via ORAL
  Filled 2017-05-30 (×3): qty 1

## 2017-05-30 MED ORDER — METOPROLOL TARTRATE 25 MG PO TABS
25.0000 mg | ORAL_TABLET | Freq: Two times a day (BID) | ORAL | Status: DC
  Administered 2017-05-30 – 2017-05-31 (×2): 25 mg via ORAL
  Filled 2017-05-30 (×5): qty 1

## 2017-05-30 MED ORDER — HALOPERIDOL 5 MG PO TABS
10.0000 mg | ORAL_TABLET | Freq: Every day | ORAL | Status: DC
  Administered 2017-05-30: 10 mg via ORAL
  Filled 2017-05-30 (×3): qty 2

## 2017-05-30 NOTE — Progress Notes (Signed)
1:1 Note: Patient placed on constant supervision for safety.  Patient presents with flat affect and depressed mood.  Reports hearing voices and seeing things.  Denies suicidal thoughts.  Compliant with treatment plan of care.  Ambulating on the unit with wheel chair self propelling.  Routine safety checks maintained every 15 minutes.  Support and encouragement offered as needed.  Patient is safe on the unit.

## 2017-05-30 NOTE — Progress Notes (Signed)
1:1 Progress Note D: Pt currently resting in bed. Patient appropriate to situation. Pt in no current distress.  A: Sitter is currently at bedside. R: Pt remains safe on a 1:1 per MD orders.

## 2017-05-30 NOTE — Tx Team (Signed)
Interdisciplinary Treatment and Diagnostic Plan Update  05/30/2017 Time of Session: 9:06 AM  Jenna Hill MRN: [id number]  Principal Diagnosis: Schizoaffective disorder, bipolar type ([place])  Secondary Diagnoses: Principal Problem:   Schizoaffective disorder, bipolar type ([place]) Active Problems:   Multiple thyroid nodules   Current Medications:  Current Facility-Administered Medications  Medication Dose Route Frequency Provider Last Rate Last Dose  . acetaminophen (TYLENOL) tablet 650 mg  650 mg Oral Q6H PRN Sharma Covert, MD   650 mg at 05/29/17 2243  . alum & mag hydroxide-simeth (MAALOX/MYLANTA) 200-200-20 MG/5ML suspension 30 mL  30 mL Oral Q4H PRN Money, [name] B, FNP      . amLODipine (NORVASC) tablet 10 mg  10 mg Oral Daily Maris Berger T, MD   10 mg at 05/29/17 1757  . haloperidol (HALDOL) 2 MG/ML solution 5 mg  5 mg Oral BID Buford Dresser J, DO   5 mg at 05/30/17 9518  . haloperidol (HALDOL) tablet 5 mg  5 mg Oral Q6H PRN Money, Lowry Ram, FNP   5 mg at 05/30/17 0140   Or  . haloperidol lactate (HALDOL) injection 5 mg  5 mg Intramuscular Q6H PRN Money, Lowry Ram, FNP      . hydrOXYzine (ATARAX/VISTARIL) tablet 50 mg  50 mg Oral Q6H PRN Pennelope Bracken, MD   50 mg at 05/29/17 2139  . loratadine (CLARITIN) tablet 10 mg  10 mg Oral Daily Pennelope Bracken, MD   10 mg at 05/30/17 0826  . magnesium chloride (SLOW-MAG) 64 MG SR tablet 64 mg  1 tablet Oral Daily Pennelope Bracken, MD   64 mg at 05/29/17 1757  . magnesium hydroxide (MILK OF MAGNESIA) suspension 30 mL  30 mL Oral Daily PRN Money, [name] B, FNP      . traZODone (DESYREL) tablet 100 mg  100 mg Oral QHS PRN,MR X 1 Lindon Romp A, NP   100 mg at 05/30/17 0140    PTA Medications: Medications Prior to Admission  Medication Sig Dispense Refill Last Dose  . acetaminophen (TYLENOL) 325 MG tablet Take 2 tablets (650 mg total) by mouth every 6 (six) hours as needed for moderate pain or  headache.   Past Month at Unknown time  . amLODipine (NORVASC) 10 MG tablet Take 1 tablet (10 mg total) by mouth daily with supper. 30 tablet 0 05/20/2017 at Unknown time  . clozapine (CLOZARIL) 200 MG tablet Take 400 mg by mouth at bedtime.    05/19/2017 at Unknown time  . doxepin (SINEQUAN) 50 MG capsule Take 1 capsule (50 mg total) by mouth at bedtime.   Past Week at Unknown time  . Eszopiclone 3 MG TABS Take 3 mg by mouth at bedtime.  1   . gabapentin (NEURONTIN) 300 MG capsule Take 1 capsule (300 mg total) by mouth at bedtime. 30 capsule 0 Past Week at Unknown time  . loratadine (ALLERGY RELIEF) 10 MG tablet Take 1 tablet (10 mg total) by mouth daily as needed for allergies.   unknown  . metoprolol tartrate (LOPRESSOR) 100 MG tablet Take 1 tablet (100 mg total) by mouth 2 (two) times daily. 60 tablet 0 05/20/2017 at 0900  . pantoprazole (PROTONIX) 40 MG tablet Take 1 tablet (40 mg total) by mouth 2 (two) times daily before a meal. 60 tablet 0 05/20/2017 at Unknown time  . potassium chloride SA (K-DUR,KLOR-CON) 20 MEQ tablet Take 1 tablet (20 mEq total) by mouth daily. 30 tablet 0 05/20/2017 at Unknown time  .  pregabalin (LYRICA) 50 MG capsule Take 1 capsule (50 mg total) by mouth 3 (three) times daily. 30 capsule 0   . traMADol (ULTRAM) 50 MG tablet Take 2 tablets (100 mg total) by mouth every 12 (twelve) hours as needed for severe pain (breakthrough). 20 tablet 0 unknown  . TRINTELLIX 20 MG TABS tablet Take 20 mg by mouth daily. With food  2 05/19/2017 at Unknown time    Patient Stressors: Health problems Medication change or noncompliance  Patient Strengths: Curator fund of knowledge Motivation for treatment/growth Supportive family/friends  Treatment Modalities: Medication Management, Group therapy, Case management,  1 to 1 session with clinician, Psychoeducation, Recreational therapy.   Physician Treatment Plan for Primary Diagnosis: Schizoaffective disorder,  bipolar type ([place]) Long Term Goal(s): Improvement in symptoms so as ready for discharge  Short Term Goals: Ability to identify and develop effective coping behaviors will improve Compliance with prescribed medications will improve  Medication Management: Evaluate patient's response, side effects, and tolerance of medication regimen.  Therapeutic Interventions: 1 to 1 sessions, Unit Group sessions and Medication administration.  Evaluation of Outcomes: Adequate for Discharge  4/29:   Physician Treatment Plan for Secondary Diagnosis: Principal Problem:   Schizoaffective disorder, bipolar type ([place]) Active Problems:   Multiple thyroid nodules   Long Term Goal(s): Improvement in symptoms so as ready for discharge  Short Term Goals: Ability to identify and develop effective coping behaviors will improve Compliance with prescribed medications will improve  Medication Management: Evaluate patient's response, side effects, and tolerance of medication regimen.  Therapeutic Interventions: 1 to 1 sessions, Unit Group sessions and Medication administration.  Evaluation of Outcomes: Adequate for Discharge  4/29:-Schizoaffective disorder, bipolar type -Discontinue clozapine (had been DC'd yesterday in context of QTc prologation)             -Start haldol 28m po BID  -Hypokalemia             - Start potassium chloride 221m BID for 4 doses (then recheck CMP on 4/29 AM)  -Hypomagnesemia              -Start magnesium chloride SR 6479mo qDay  (will recheck CMP on 4/29 AM)    RN Treatment Plan for Primary Diagnosis: Schizoaffective disorder, bipolar type ([place] Term Goal(s): Knowledge of disease and therapeutic regimen to maintain health will improve  Short Term Goals: Ability to disclose and discuss suicidal ideas, Ability to identify and develop effective coping behaviors will improve and Compliance with prescribed medications will improve  Medication Management: RN  will administer medications as ordered by provider, will assess and evaluate patient's response and provide education to patient for prescribed medication. RN will report any adverse and/or side effects to prescribing provider.  Therapeutic Interventions: 1 on 1 counseling sessions, Psychoeducation, Medication administration, Evaluate responses to treatment, Monitor vital signs and CBGs as ordered, Perform/monitor CIWA, COWS, AIMS and Fall Risk screenings as ordered, Perform wound care treatments as ordered.  Evaluation of Outcomes: Adequate for Discharge   LCSW Treatment Plan for Primary Diagnosis: Schizoaffective disorder, bipolar type ([place] Term Goal(s): Safe transition to appropriate next level of care at discharge, Engage patient in therapeutic group addressing interpersonal concerns.  Short Term Goals: Engage patient in aftercare planning with referrals and resources  Therapeutic Interventions: Assess for all discharge needs, 1 to 1 time with Social worker, Explore available resources and support systems, Assess for adequacy in community support network, Educate family and significant other(s) on suicide prevention, Complete Psychosocial Assessment,  Interpersonal group therapy.  Evaluation of Outcomes: Met  Return home, follow up [facility]    Progress in Treatment: Attending groups: Yes Participating in groups: Yes Taking medication as prescribed: Yes Toleration medication: Yes, no side effects reported at this time Family/Significant other contact made: Yes Patient understands diagnosis: No Limited insight Discussing patient identified problems/goals with staff: Yes Medical problems stabilized or resolved: Yes Denies suicidal/homicidal ideation: Yes Issues/concerns per patient self-inventory: None Other: N/A  New problem(s) identified: None identified at this time.   New Short Term/Long Term Goal(s): None identified at this time.   Discharge Plan or Barriers:    Reason for Continuation of Hospitalization:    Estimated Length of Stay: D/C today  Attendees: Patient: 05/30/2017  9:06 AM  Physician: Maris Berger, MD 05/30/2017  9:06 AM  Nursing: Sena Hitch, RN 05/30/2017  9:06 AM  RN Care Manager: Lars Pinks, RN 05/30/2017  9:06 AM  Social Worker: Ripley Fraise 05/30/2017  9:06 AM  Recreational Therapist: Winfield Cunas 05/30/2017  9:06 AM  Other: Norberto Sorenson 05/30/2017  9:06 AM  Other:  05/30/2017  9:06 AM    Scribe for Treatment Team:  Roque Lias LCSW 05/30/2017 9:06 AM

## 2017-05-30 NOTE — Progress Notes (Signed)
Recreation Therapy Notes  Date: 4.29.19 Time: 1000 Location: [address] Dayroom  Group Topic: Coping Skills  Goal Area(s) Addresses:  Patient will be able to identify positive coping skills. Patient will be able to identify benefits of using coping skills. Patient will be able to identify benefit of using coping skills post d/c.  Intervention: Pencils, worksheet  Activity: Coping Skills Mind Map.  LRT and patients filled in the first 8 boxes of the mind map (depression, blind ambition, struggle, finances, relationships, sickness, loss of a loved one and temptation) together.  Patients were then given time to come with at least 3 positive coping skills for each situation on their own before the group came back together.  Education: Radiographer, therapeutic, Dentist.   Education Outcome: Acknowledges understanding/In group clarification offered/Needs additional education.   Clinical Observations/Feedback: Pt did not attend group.    Victorino Sparrow, LRT/CTRS         Victorino Sparrow A 05/30/2017 12:51 PM

## 2017-05-30 NOTE — [facility] Group Notes (Signed)
LCSW Group Therapy Note   05/30/2017 1:15pm   Type of Therapy and Topic:  Group Therapy:  Overcoming Obstacles   Participation Level:  Minimal   Description of Group:    In this group patients will be encouraged to explore what they see as obstacles to their own wellness and recovery. They will be guided to discuss their thoughts, feelings, and behaviors related to these obstacles. The group will process together ways to cope with barriers, with attention given to specific choices patients can make. Each patient will be challenged to identify changes they are motivated to make in order to overcome their obstacles. This group will be process-oriented, with patients participating in exploration of their own experiences as well as giving and receiving support and challenge from other group members.   Therapeutic Goals: 1. Patient will identify personal and current obstacles as they relate to admission. 2. Patient will identify barriers that currently interfere with their wellness or overcoming obstacles.  3. Patient will identify feelings, thought process and behaviors related to these barriers. 4. Patient will identify two changes they are willing to make to overcome these obstacles:      Summary of Patient Progress   Stayed the entire time, engaged throughout.  Stated she has no identified obstacles, and she is looking forward to getting home.  "My meds are good, and I have good supports."   Therapeutic Modalities:   Cognitive Behavioral Therapy Solution Focused Therapy Motivational Interviewing Relapse Prevention [place], LCSW 05/30/2017 3:57 PM

## 2017-05-30 NOTE — [facility] Group Notes (Signed)
[place] Group Notes:  (Nursing/MHT/Case Management/Adjunct)  Date:  05/30/2017  Time:  9:12 AM  Type of Therapy:  orientation/goals  Summary of Progress/Problems: Pt did not attend.  Jenna Hill 05/30/2017, 9:12 AM

## 2017-05-30 NOTE — Progress Notes (Signed)
1:1 Note: Patient maintained on constant supervision for safety. Denies suicidal thoughts but reports auditory and visual hallucinations.  Medications given as prescribed.  Support and encouragement offered as needed.  Routine safety checks maintained every 15 minutes.  Remained safe on the unit with supervision.

## 2017-05-30 NOTE — Progress Notes (Signed)
Pt has been lying in the bed for about an hour.  She just got up to use the bathroom and then lay back down.  Sitter reports she voiced no needs or concerns.  She received Tylenol for pain to her R foot and was also given a heat pack for comfort.  She continues to use a wheelchair to get around the unit.  Support and encouragement offered.  Continue 1:1 for safety.  Sitter at bedside.  Discharge plans are in process.  Safety maintained at this time.

## 2017-05-30 NOTE — Progress Notes (Signed)
[facility] MD Progress Note  05/30/2017 2:00 PM Jenna Hill  MRN:  [id number] Subjective:    Jenna Hill is a 47 y/o F with history of schizoaffective disorder, bipolar type who was admitted from [place] on IVC after she presented brought in by EMS when she was found down by a friend in her apartment and only responsive to pain from an apparent overdose of her prescription medications. Pt reported she was unsure what she had taken and her intention was not to overdose. She was found with prescriptions near here of abilify, amiodarone, klonopin, doxepin, metoprolol, seroquel, and clozapine. Pt has recent history of admission to [facility] from 04/05/17 - 05/12/17 for similar presentation and treatment for overdose of her medications when she was found down which required stabilization via intubation and hemodialysis. Pt was placed on IVC after medical stabilization and she was transferred to [facility] for additional treatment and stabilization. As per chart review pt had been undergoing medication changes with her outpatient provider and transitioning from [place] to clozapine.Upon admission, pt had multiple medications discontinued due to concern for over-sedation (including lunesta, seroquel, klonopin, and doxepin), and her regimen was consolidated to clozapine at starting dose with plan to titrate up the dose. PTwasconsulted to follow patient to assist with building strength and ambulation. Pt had tachycardia and QTc prolongation, so clozapine was stopped and she was changed to haldol. She had low potassium and magnesium, which have been supplemented with plan to recheck CMP on 4/29. Pt has been reporting incremental improvement of her presenting mood and psychotic symptoms. She was evaluated in ED for fall on 4/27 and medically cleared to return to [facility] with recommendation to start metoprolol for tachycardia.  Today upon evaluation, pt shares, "I'm okay, my foot just hurts a little." She denies  other concerns today. She reports her sleep is adequate, despite RN staff recording 3.5 hours. She reports AH have improved during her stay, and she rates them "5/10" intensity today. She still actively responds to them in a strange language during interview, but she denies that they are command in nature or intrusive/bothersome. She denies SI/HI/VH. She feels that her medications have been helpful and she is tolerating them well. She is in agreement to increase evening dose of haldol. When she was seen in ED, it was recommended that she be started on metoprolol for tachycardia, and she remains mildly tachycardic today, so we will start recommended dose of metoprolol 25mg  po BID. Pt requests to discharge as soon as possible, and we will contact her roommate to check for any safety concerns with intention of discharging to outpatient level of care if there are no concerns. Pt was in agreement with the above plan, and she had no further questions, comments, or concerns.   Principal Problem: Schizoaffective disorder, bipolar type ([place]) Diagnosis:   Patient Active Problem List   Diagnosis Date Noted  . Multiple thyroid nodules [E04.2] 05/28/2017  . Sinus tachycardia [R00.0] 05/20/2017  . Overdose [T50.901A] 05/20/2017  . Chronic diastolic CHF (congestive heart failure) ([place]) [I50.32] 05/20/2017  . Injury of right sciatic nerve [S74.01XA] 04/29/2017  . AKI (acute kidney injury) ([place]) [N17.9] 04/25/2017  . Shock liver [K72.00] 04/10/2017  . Rhabdomyolysis [M62.82] 04/10/2017  . Bipolar 1 disorder ([place]) [F31.9] 04/10/2017  . Acute respiratory failure ([place]) [J96.00]   . Acute encephalopathy [G93.40] 04/05/2017  . Pituitary microadenoma ([place]) [D35.2] 04/25/2015  . OSA (obstructive sleep apnea) [G47.33] 04/16/2015  . Obesity [E66.9] 04/16/2015  . Schizoaffective disorder, bipolar  type ([place]) [F25.0] 04/15/2015  . Hypertension [I10] 10/05/2012   Total Time spent with patient: 30 minutes  Past  Psychiatric History: see H&P for details  Past Medical History:  Past Medical History:  Diagnosis Date  . Bipolar 1 disorder ([place])   . Difficult intubation   . Hypertension   . PONV (postoperative nausea and vomiting)   . Schizophrenia ([place])   . Spinal headache     Past Surgical History:  Procedure Laterality Date  . ANKLE ARTHROSCOPY Right   . CESAREAN SECTION    . HAND SURGERY Right   . ORIF ANKLE FRACTURE Left 01/29/2016   Procedure: OPEN REDUCTION INTERNAL FIXATION LEFT ANKLE BIMALLEOLAR FRACTURE AND SYNDESMOSIS;  Surgeon: Wylene Simmer, MD;  Location: [facility];  Service: Orthopedics;  Laterality: Left;   Family History:  Family History  Problem Relation Age of Onset  . Healthy Mother   . Healthy Father   . Healthy Sister    Family Psychiatric  History: see H&P Social History:  Social History   Substance and Sexual Activity  Alcohol Use Yes   Comment: Denies     Social History   Substance and Sexual Activity  Drug Use No   Comment: Denies    Social History   Socioeconomic History  . Marital status: Married    Spouse name: Not on file  . Number of children: Not on file  . Years of education: Not on file  . Highest education level: Not on file  Occupational History  . Not on file  Social Needs  . Financial resource strain: Not on file  . Food insecurity:    Worry: Not on file    Inability: Not on file  . Transportation needs:    Medical: Not on file    Non-medical: Not on file  Tobacco Use  . Smoking status: Never Smoker  . Smokeless tobacco: Never Used  Substance and Sexual Activity  . Alcohol use: Yes    Comment: Denies  . Drug use: No    Comment: Denies  . Sexual activity: Not Currently    Birth control/protection: None  Lifestyle  . Physical activity:    Days per week: Not on file    Minutes per session: Not on file  . Stress: Not on file  Relationships  . Social connections:    Talks on phone: Not on file    Gets  together: Not on file    Attends religious service: Not on file    Active member of club or organization: Not on file    Attends meetings of clubs or organizations: Not on file    Relationship status: Not on file  Other Topics Concern  . Not on file  Social History Narrative  . Not on file   Additional Social History:                         Sleep: Good  Appetite:  Good  Current Medications: Current Facility-Administered Medications  Medication Dose Route Frequency Provider Last Rate Last Dose  . acetaminophen (TYLENOL) tablet 650 mg  650 mg Oral Q6H PRN Sharma Covert, MD   650 mg at 05/29/17 2243  . alum & mag hydroxide-simeth (MAALOX/MYLANTA) 200-200-20 MG/5ML suspension 30 mL  30 mL Oral Q4H PRN Money, [name] B, FNP      . amLODipine (NORVASC) tablet 10 mg  10 mg Oral Daily Pennelope Bracken, MD   10 mg at  05/29/17 1757  . haloperidol (HALDOL) 2 MG/ML solution 5 mg  5 mg Oral BID Buford Dresser J, DO   5 mg at 05/30/17 9485  . haloperidol (HALDOL) tablet 5 mg  5 mg Oral Q6H PRN Money, Lowry Ram, FNP   5 mg at 05/30/17 0140   Or  . haloperidol lactate (HALDOL) injection 5 mg  5 mg Intramuscular Q6H PRN Money, Lowry Ram, FNP      . hydrOXYzine (ATARAX/VISTARIL) tablet 50 mg  50 mg Oral Q6H PRN Pennelope Bracken, MD   50 mg at 05/29/17 2139  . loratadine (CLARITIN) tablet 10 mg  10 mg Oral Daily Pennelope Bracken, MD   10 mg at 05/30/17 0826  . magnesium chloride (SLOW-MAG) 64 MG SR tablet 64 mg  1 tablet Oral Daily Pennelope Bracken, MD   64 mg at 05/30/17 1020  . magnesium hydroxide (MILK OF MAGNESIA) suspension 30 mL  30 mL Oral Daily PRN Money, Lowry Ram, FNP      . traZODone (DESYREL) tablet 100 mg  100 mg Oral QHS PRN,MR X 1 Lindon Romp A, NP   100 mg at 05/30/17 0140    Lab Results:  Results for orders placed or performed during the hospital encounter of 05/24/17 (from the past 48 hour(s))  Magnesium     Status: Abnormal    Collection Time: 05/28/17  3:00 PM  Result Value Ref Range   Magnesium 1.4 (L) 1.7 - 2.4 mg/dL    Comment: Performed at [facility], [place] [address] [place], [place] [address]  D-dimer, quantitative (not at [facility])     Status: Abnormal   Collection Time: 05/28/17  3:00 PM  Result Value Ref Range   D-Dimer, Quant 1.05 (H) 0.00 - 0.50 ug/mL-FEU    Comment: (NOTE) At the manufacturer cut-off of 0.50 ug/mL FEU, this assay has been documented to exclude PE with a sensitivity and negative predictive value of 97 to 99%.  At this time, this assay has not been approved by the FDA to exclude DVT/VTE. Results should be correlated with clinical presentation. Performed at [facility], [place] [address], [place], [place] [address]   I-stat troponin, ED     Status: None   Collection Time: 05/28/17  3:06 PM  Result Value Ref Range   Troponin i, poc 0.00 0.00 - 0.08 ng/mL   Comment 3            Comment: Due to the release kinetics of cTnI, a negative result within the first hours of the onset of symptoms does not rule out myocardial infarction with certainty. If myocardial infarction is still suspected, repeat the test at appropriate intervals.   I-Stat beta hCG blood, ED     Status: None   Collection Time: 05/28/17  3:06 PM  Result Value Ref Range   I-stat hCG, quantitative <5.0 <5 mIU/mL   Comment 3            Comment:   GEST. AGE      CONC.  (mIU/mL)   <=1 WEEK        5 - 50     2 WEEKS       50 - 500     3 WEEKS       100 - 10,000     4 WEEKS     1,000 - 30,000        FEMALE AND NON-PREGNANT FEMALE:     LESS THAN 5  mIU/mL   CBC with Differential     Status: Abnormal   Collection Time: 05/28/17  7:39 PM  Result Value Ref Range   WBC 8.5 4.0 - 10.5 K/uL   RBC 3.94 3.87 - 5.11 MIL/uL   Hemoglobin 11.4 (L) 12.0 - 15.0 g/dL   HCT 35.0 (L) 36.0 - 46.0 %   MCV 88.8 78.0 - 100.0 fL   MCH 28.9 26.0 - 34.0 pg   MCHC 32.6 30.0 - 36.0 g/dL   RDW 13.1 11.5  - 15.5 %   Platelets 339 150 - 400 K/uL   Neutrophils Relative % 44 %   Neutro Abs 3.7 1.7 - 7.7 K/uL   Lymphocytes Relative 45 %   Lymphs Abs 3.8 0.7 - 4.0 K/uL   Monocytes Relative 8 %   Monocytes Absolute 0.7 0.1 - 1.0 K/uL   Eosinophils Relative 3 %   Eosinophils Absolute 0.2 0.0 - 0.7 K/uL   Basophils Relative 0 %   Basophils Absolute 0.0 0.0 - 0.1 K/uL    Comment: Performed at [facility], [place] [address] [place], [place] [address]    Blood Alcohol level:  Lab Results  Component Value Date   ETH <10 05/20/2017   ETH <10 97/98/9211    Metabolic Disorder Labs: Lab Results  Component Value Date   HGBA1C 6.9 (H) 04/05/2017   MPG 151.33 04/05/2017   Lab Results  Component Value Date   PROLACTIN 291.8 (H) 04/17/2015   Lab Results  Component Value Date   CHOL 147 04/17/2015   TRIG 259 (H) 04/18/2017   HDL 52 04/17/2015   CHOLHDL 2.8 04/17/2015   VLDL 14 04/17/2015   LDLCALC 81 04/17/2015    Physical Findings: AIMS: Facial and Oral Movements Muscles of Facial Expression: None, normal Lips and Perioral Area: None, normal Jaw: None, normal Tongue: None, normal,Extremity Movements Upper (arms, wrists, hands, fingers): None, normal Lower (legs, knees, ankles, toes): None, normal, Trunk Movements Neck, shoulders, hips: None, normal, Overall Severity Severity of abnormal movements (highest score from questions above): None, normal Incapacitation due to abnormal movements: None, normal Patient's awareness of abnormal movements (rate only patient's report): No Awareness, Dental Status Current problems with teeth and/or dentures?: No Does patient usually wear dentures?: No  CIWA:    COWS:     Musculoskeletal: Strength & Muscle Tone: within normal limits Gait & Station: using wheelchair Patient leans: N/A  Psychiatric Specialty Exam: Physical Exam  Nursing note and vitals reviewed.   Review of Systems  Constitutional: Negative for chills  and fever.  Respiratory: Negative for cough and shortness of breath.   Cardiovascular: Negative for chest pain.  Gastrointestinal: Negative for abdominal pain, heartburn, nausea and vomiting.  Psychiatric/Behavioral: Positive for hallucinations. Negative for depression and suicidal ideas. The patient has insomnia. The patient is not nervous/anxious.     Blood pressure 131/74, pulse (!) 124, temperature 98.9 F (37.2 C), temperature source Oral, resp. rate 20, height 5' (1.524 m), weight 97.5 kg (215 lb), last menstrual period 05/15/2017, SpO2 97 %.Body mass index is 41.99 kg/m.  General Appearance: Casual and Fairly Groomed  Eye Contact:  Good  Speech:  Clear and Coherent and Normal Rate  Volume:  Normal  Mood:  Euthymic  Affect:  Flat  Thought Process:  Coherent and Goal Directed  Orientation:  Full (Time, Place, and Person)  Thought Content:  Hallucinations: Auditory  Suicidal Thoughts:  No  Homicidal Thoughts:  No  Memory:  Immediate;   Fair Recent;  Fair Remote;   Fair  Judgement:  Fair  Insight:  Lacking  Psychomotor Activity:  Normal  Concentration:  Concentration: Fair  Recall:  Poor  Fund of Knowledge:  Fair  Language:  Fair  Akathisia:  No  Handed:    AIMS (if indicated):     Assets:  Resilience  ADL's:  Intact  Cognition:  WNL  Sleep:  Number of Hours: 3.5   Treatment Plan Summary: Daily contact with patient to assess and evaluate symptoms and progress in treatment and Medication management   -Continue inpatient hospitalization  -Schizoaffective disorder, bipolar type    -Change haldol 5mg  po BID to haldol 5mg  po qAM + 10mg  po qhs  -Hypokalemia             - Started on potassium chloride 76mEq BID for 4 doses on 4/27 (will recheck CMP on today 4/29)  -Hypomagnesemia              -Started magnesium chloride SR 64mg  po qDay on 4/27 (will recheck CMP on today 4/29)  -Tachycardia   - Start metoprolol 25mg  po BID as per rec's from  ED  -HTN -Continue norvasc 10mg  po qDay  -Seasonal allergies -Continue claritin 10mg  po qDay  -Anxiety -Continue atarax 50mg  po q6h prn anxiety  -Insomnia -Continue trazodone 50mg  po qhs prn insomnia (may repeat x1)  - Agitation -Continue agitation protocol with haldol 5mg  po/IM q6h prn agitation/psychosis  -Encourage participation in groups and therapeutic milieu  -Disposition planning will be ongoing  Pennelope Bracken, MD 05/30/2017, 2:00 PM

## 2017-05-30 NOTE — Progress Notes (Signed)
1:1  Note: Patient maintained on constant supervision for safety.  No issues reported or noted.  Patient is compliant with treatment plan of care.  Routine safety checks maintained every 15 minutes.  Support and encouragement offered as needed.  Attended group and participated.  Patient is safe on the unit.

## 2017-05-31 MED ORDER — HALOPERIDOL 5 MG PO TABS: ORAL_TABLET | ORAL | 0 refills | Status: DC

## 2017-05-31 MED ORDER — LORATADINE 10 MG PO TABS: 10.0000 mg | ORAL_TABLET | Freq: Every day | ORAL | Status: AC

## 2017-05-31 MED ORDER — GABAPENTIN 300 MG PO CAPS: 300.0000 mg | ORAL_CAPSULE | Freq: Every day | ORAL | 0 refills | Status: DC

## 2017-05-31 MED ORDER — METOPROLOL TARTRATE 25 MG PO TABS: 25.0000 mg | ORAL_TABLET | Freq: Two times a day (BID) | ORAL | Status: DC

## 2017-05-31 MED ORDER — AMLODIPINE BESYLATE 10 MG PO TABS: 10.0000 mg | ORAL_TABLET | Freq: Every day | ORAL | Status: DC

## 2017-05-31 MED ORDER — HYDROXYZINE HCL 50 MG PO TABS: 50.0000 mg | ORAL_TABLET | Freq: Four times a day (QID) | ORAL | 0 refills | Status: DC | PRN

## 2017-05-31 MED ORDER — TRAZODONE HCL 100 MG PO TABS: ORAL_TABLET | ORAL | 0 refills | Status: DC

## 2017-05-31 NOTE — Progress Notes (Signed)
Recreation Therapy Notes  INPATIENT RECREATION TR PLAN  Patient Details Name: Jenna Hill MRN: [id number] DOB: 21-Jan-1971 Today's Date: 05/31/2017  Rec Therapy Plan Is patient appropriate for Therapeutic Recreation?: Yes Treatment times per week: about 3 days Estimated Length of Stay: 5-7 days TR Treatment/Interventions: Group participation (Comment)  Discharge Criteria Pt will be discharged from therapy if:: Discharged Treatment plan/goals/alternatives discussed and agreed upon by:: Patient/family  Discharge Summary Short term goals set: See care plan Short term goals met: Adequate for discharge Progress toward goals comments: Groups attended Which groups?: Other (Comment)(Team building) Reason goals not met: Pt attended one group. Therapeutic equipment acquired: N/A Reason patient discharged from therapy: Discharge from hospital Pt/family agrees with progress & goals achieved: Yes Date patient discharged from therapy: 05/31/17    Victorino Sparrow, LRT/CTRS  Ria Comment, Curry Dulski A 05/31/2017, 12:17 PM

## 2017-05-31 NOTE — Progress Notes (Signed)
Patient discharged to lobby. Patient was stable and appreciative at that time. All papers and prescriptions were given and valuables returned. Verbal understanding expressed. Denies SI/HI and A/VH. Patient given opportunity to express concerns and ask questions.  

## 2017-05-31 NOTE — Discharge Summary (Addendum)
Physician Discharge Summary Note  Patient:  Jenna Hill is an 47 y.o., female MRN:  [id number]  DOB:  July 26, 1970  Patient phone:  [phone] (home)   Patient address:   [place] [address],  Total Time spent with patient: Greater than 30 minutes  Date of Admission:  05/24/2017  Date of Discharge: 05-31-17  Reason for Admission: Apparent prescription drug overdose.  Principal Problem: Schizoaffective disorder, bipolar type [facility])  Discharge Diagnoses: Patient Active Problem List   Diagnosis Date Noted  . Multiple thyroid nodules [E04.2] 05/28/2017  . Sinus tachycardia [R00.0] 05/20/2017  . Overdose [T50.901A] 05/20/2017  . Chronic diastolic CHF (congestive heart failure) ([place]) [I50.32] 05/20/2017  . Injury of right sciatic nerve [S74.01XA] 04/29/2017  . AKI (acute kidney injury) ([place]) [N17.9] 04/25/2017  . Shock liver [K72.00] 04/10/2017  . Rhabdomyolysis [M62.82] 04/10/2017  . Bipolar 1 disorder ([place]) [F31.9] 04/10/2017  . Acute respiratory failure ([place]) [J96.00]   . Acute encephalopathy [G93.40] 04/05/2017  . Pituitary microadenoma ([place]) [D35.2] 04/25/2015  . OSA (obstructive sleep apnea) [G47.33] 04/16/2015  . Obesity [E66.9] 04/16/2015  . Schizoaffective disorder, bipolar type ([place]) [F25.0] 04/15/2015  . Hypertension [I10] 10/05/2012   Past Psychiatric History: Hx. Schizoaffective disorder.  Past Medical History:  Past Medical History:  Diagnosis Date  . Bipolar 1 disorder ([place])   . Difficult intubation   . Hypertension   . PONV (postoperative nausea and vomiting)   . Schizophrenia ([place])   . Spinal headache     Past Surgical History:  Procedure Laterality Date  . ANKLE ARTHROSCOPY Right   . CESAREAN SECTION    . HAND SURGERY Right   . ORIF ANKLE FRACTURE Left 01/29/2016   Procedure: OPEN REDUCTION INTERNAL FIXATION LEFT ANKLE BIMALLEOLAR FRACTURE AND SYNDESMOSIS;  Surgeon: Wylene Simmer, MD;  Location: [place];   Service: Orthopedics;  Laterality: Left;   Family History:  Family History  Problem Relation Age of Onset  . Healthy Mother   . Healthy Father   . Healthy Sister    Family Psychiatric  History: See H&P Social History:  Social History   Substance and Sexual Activity  Alcohol Use Yes   Comment: Denies     Social History   Substance and Sexual Activity  Drug Use No   Comment: Denies    Social History   Socioeconomic History  . Marital status: Married    Spouse name: Not on file  . Number of children: Not on file  . Years of education: Not on file  . Highest education level: Not on file  Occupational History  . Not on file  Social Needs  . Financial resource strain: Not on file  . Food insecurity:    Worry: Not on file    Inability: Not on file  . Transportation needs:    Medical: Not on file    Non-medical: Not on file  Tobacco Use  . Smoking status: Never Smoker  . Smokeless tobacco: Never Used  Substance and Sexual Activity  . Alcohol use: Yes    Comment: Denies  . Drug use: No    Comment: Denies  . Sexual activity: Not Currently    Birth control/protection: None  Lifestyle  . Physical activity:    Days per week: Not on file    Minutes per session: Not on file  . Stress: Not on file  Relationships  . Social connections:    Talks on phone: Not on file    Gets  together: Not on file    Attends religious service: Not on file    Active member of club or organization: Not on file    Attends meetings of clubs or organizations: Not on file    Relationship status: Not on file  Other Topics Concern  . Not on file  Social History Narrative  . Not on file   Hospital Course: (Per Md's discharge SRA): Jenna Hill is a 47 y/o F with history of schizoaffective disorder, bipolar type who was admitted from [place] on IVC after she presented brought in by EMS when she was found down by a friend in her apartment and only responsive to pain from an apparent overdose of  her prescription medications. Pt reported she was unsure what she had taken and her intention was not to overdose. She was found with prescriptions near here of abilify, amiodarone, klonopin, doxepin, metoprolol, seroquel, and clozapine. Pt has recent history of admission to [facility] from 04/05/17 - 05/12/17 for similar presentation and treatment for overdose of her medications when she was found down which required stabilization via intubation and hemodialysis. Pt was placed on IVC after medical stabilization and she was transferred to [facility] for additional treatment and stabilization. As per chart review pt had been undergoing medication changes with her outpatient provider and transitioning from [place] to clozapine.Upon admission, pt had multiple medications discontinued due to concern for over-sedation (including lunesta, seroquel, klonopin, and doxepin), and her regimen was consolidated to clozapine at starting dose with plan to titrate up the dose. PTwasconsulted to follow patient to assist with building strength and ambulation.Pthad tachycardiaand QTc prolongation, so clozapine was stopped and she was changed to haldol. She had low potassium and magnesium, which have been supplemented with plan to recheck CMP on 4/29. Pt has been reporting incremental improvement of her presenting mood and psychotic symptoms. She was evaluated in ED for fall on 4/27 and medically cleared to return to [facility] with recommendation to start metoprolol for tachycardia. Pt has been reporting improvement of her presenting symptoms.  Besides the use of Haldol 5 mg in am & 10 mg at bedtime for mood control, Jenna Hill was also medicated & discharged on; Gabapentin 300 mg for agitation, Hydroxyzine 50 mg prn for anxiety & Trazodone 100 mg for insomnia. She was resumed on all her pertinent home medications for her other pre-existing medical issues presented. She tolerated her treatment regimen without any adverse effects  or reactions reported. Jenna Hill was enrolled & participated in the group counseling sessions being offered & held on this unit. She learned coping skills.  Today upon her discharge evaluation, pt shares, "No problems - just my foot is sore." She plans to follow up with her orthopedic doctor regarding her foot. She otherwise denies physical complaints. She is sleeping well as her report, but staff noted only 3.5 hours of sleep. She continues to endorse AH, stating, "They're there, but they say they'll leave me be." She briefly talks to voices during interview, but she is able to track and does not seem distracted. She denies SI/HI/VH. She feels that her medications have been helpful and she is tolerating them well. She is in agreement to continue her current treatment regimen without changes. She was able to engage in safety planning including plan to return to [facility] or contact emergency services if she feels unable to maintain her own safety or the safety of others. Pt had no further questions, comments, or concerns.  Upon discharge, Lakresha presents mentally &  medically stable. She will continue mental health care on an outpatient basis as noted below. She was provided with all the necessary information needed to make this appointment without problems. She left [facility] with all personal belongings in no apparent distress. Transportation per family.  Physical Findings: AIMS: Facial and Oral Movements Muscles of Facial Expression: None, normal Lips and Perioral Area: None, normal Jaw: None, normal Tongue: None, normal,Extremity Movements Upper (arms, wrists, hands, fingers): None, normal Lower (legs, knees, ankles, toes): None, normal, Trunk Movements Neck, shoulders, hips: None, normal, Overall Severity Severity of abnormal movements (highest score from questions above): None, normal Incapacitation due to abnormal movements: None, normal Patient's awareness of abnormal movements (rate only patient's  report): No Awareness, Dental Status Current problems with teeth and/or dentures?: No Does patient usually wear dentures?: No  CIWA:    COWS:     Musculoskeletal: Strength & Muscle Tone: within normal limits Gait & Station: normal Patient leans: N/A  Psychiatric Specialty Exam: Physical Exam  Constitutional: She appears well-developed.  HENT:  Head: Normocephalic.  Eyes: Pupils are equal, round, and reactive to light.  Neck: Normal range of motion.  Cardiovascular:  Hx. HTN  Respiratory: Effort normal.  GI: Soft.  Genitourinary:  Genitourinary Comments: Deferred  Musculoskeletal: Normal range of motion.  Neurological: She is alert.  Skin: Skin is warm.    Review of Systems  Constitutional: Negative.   HENT: Negative.   Eyes: Negative.   Respiratory: Negative.   Cardiovascular:       Hx. HTN  Gastrointestinal: Negative.   Genitourinary: Negative.   Musculoskeletal: Positive for joint pain and myalgias (Hx of).       Hx, of pain & generalized weakness  Skin: Negative.   Neurological: Negative.   Endo/Heme/Allergies: Negative.   Psychiatric/Behavioral: Positive for depression (Stable), hallucinations (Hx. auditory hallucinations) and substance abuse (Hx. Benzodiazepine use). Negative for memory loss and suicidal ideas. The patient has insomnia (Stable). The patient is not nervous/anxious.     Blood pressure 131/87, pulse (!) 107, temperature 98.9 F (37.2 C), temperature source Oral, resp. rate 20, height 5' (1.524 m), weight 97.5 kg (215 lb), last menstrual period 05/15/2017, SpO2 97 %.Body mass index is 41.99 kg/m.  See H&P   Have you used any form of tobacco in the last 30 days? (Cigarettes, Smokeless Tobacco, Cigars, and/or Pipes): No  Has this patient used any form of tobacco in the last 30 days? (Cigarettes, Smokeless Tobacco, Cigars, and/or Pipes): N/A  Blood Alcohol level:  Lab Results  Component Value Date   ETH <10 05/20/2017   ETH <10 11/25/8525    Metabolic Disorder Labs:  Lab Results  Component Value Date   HGBA1C 6.9 (H) 04/05/2017   MPG 151.33 04/05/2017   Lab Results  Component Value Date   PROLACTIN 291.8 (H) 04/17/2015   Lab Results  Component Value Date   CHOL 147 04/17/2015   TRIG 259 (H) 04/18/2017   HDL 52 04/17/2015   CHOLHDL 2.8 04/17/2015   VLDL 14 04/17/2015   LDLCALC 81 04/17/2015   See Psychiatric Specialty Exam and Suicide Risk Assessment completed by Attending Physician prior to discharge.  Discharge destination:  Home  Is patient on multiple antipsychotic therapies at discharge:  No   Has Patient had three or more failed trials of antipsychotic monotherapy by history:  No  Recommended Plan for Multiple Antipsychotic Therapies: NA  Allergies as of 05/31/2017      Reactions   Bee Venom Anaphylaxis   Codeine Swelling  Facial swelling   Other Anaphylaxis   bertrillium or bertillium     Toradol [ketorolac Tromethamine] Swelling   Tongue swelling   Lortab [hydrocodone-acetaminophen] Hives   Percocet [oxycodone-acetaminophen] Hives   Ibuprofen    Raised hives Also with naproxen   Latex Rash      Medication List    STOP taking these medications   acetaminophen 325 MG tablet Commonly known as:  TYLENOL   clozapine 200 MG tablet Commonly known as:  CLOZARIL   doxepin 50 MG capsule Commonly known as:  SINEQUAN   Eszopiclone 3 MG Tabs   pantoprazole 40 MG tablet Commonly known as:  PROTONIX   potassium chloride SA 20 MEQ tablet Commonly known as:  K-DUR,KLOR-CON   pregabalin 50 MG capsule Commonly known as:  LYRICA   traMADol 50 MG tablet Commonly known as:  ULTRAM   TRINTELLIX 20 MG Tabs tablet Generic drug:  vortioxetine HBr     TAKE these medications     Indication  amLODipine 10 MG tablet Commonly known as:  NORVASC Take 1 tablet (10 mg total) by mouth daily. For high blood pressure What changed:    when to take this  additional instructions  Indication:  High  Blood Pressure Disorder   gabapentin 300 MG capsule Commonly known as:  NEURONTIN Take 1 capsule (300 mg total) by mouth at bedtime. For agitation What changed:  additional instructions  Indication:  Agitation   haloperidol 5 MG tablet Commonly known as:  HALDOL Take 1 tablet (5 mg) by mouth each morning & 2 tablets (10 mg) at bedtime: For mood control  Indication:  Mood control   hydrOXYzine 50 MG tablet Commonly known as:  ATARAX/VISTARIL Take 1 tablet (50 mg total) by mouth every 6 (six) hours as needed for anxiety.  Indication:  Feeling Anxious   loratadine 10 MG tablet Commonly known as:  CLARITIN Take 1 tablet (10 mg total) by mouth daily. (May buy from over the counter): For allergies Start taking on:  06/01/2017 What changed:    when to take this  reasons to take this  additional instructions  Indication:  Perennial Allergic Rhinitis, Hayfever   metoprolol tartrate 25 MG tablet Commonly known as:  LOPRESSOR Take 1 tablet (25 mg total) by mouth 2 (two) times daily. For high blood pressure What changed:    medication strength  how much to take  additional instructions  Indication:  High Blood Pressure Disorder   traZODone 100 MG tablet Commonly known as:  DESYREL Take 1 tablet (100 mg) by mouth at bedtime: For mood control  Indication:  [place], [facility] Follow up on 06/07/2017.   Specialty:  Behavioral Health Why:  Tuesday at 1:30 with Abran Richard for your hospital follow up appointment Contact information: [place] [address] [phone]          Follow-up recommendations: Activity:  As tolerated Diet: As recommended by your primary care doctor. Keep all scheduled follow-up appointments as recommended.  Comments: Patient is instructed prior to discharge to: Take all medications as prescribed by his/her mental healthcare provider. Report any adverse  effects and or reactions from the medicines to his/her outpatient provider promptly. Patient has been instructed & cautioned: To not engage in alcohol and or illegal drug use while on prescription medicines. In the event of worsening symptoms, patient is instructed to call the crisis hotline, 911 and  or go to the nearest ED for appropriate evaluation and treatment of symptoms. To follow-up with his/her primary care provider for your other medical issues, concerns and or health care needs.   Signed: Lindell Spar, NP, PMHNP, FNP-BC 05/31/2017, 10:36 AM   Patient seen, Suicide Assessment Completed.  Disposition Plan Reviewed

## 2017-05-31 NOTE — Progress Notes (Signed)
1:1 Note: Patient maintained on constant supervision for safety.  Presents with flat affect and depressed mood.  reports auditory hallucination and denies visual hallucinations.  Medications given as prescribed.  Routine safety checks maintained every 15 minutes.  Remained in bed sleeping.  Encouraged to attend group but declined.  Patient remained safe on the unit with supervision.

## 2017-05-31 NOTE — Progress Notes (Signed)
  [facility] Adult Case Management Discharge Plan :  Will you be returning to the same living situation after discharge:  Yes,  home At discharge, do you have transportation home?: Yes,  family Do you have the ability to pay for your medications: Yes,  insurance  Release of information consent forms completed and in the chart;  Patient's signature needed at discharge.  Patient to Follow up at: [place], [facility] Follow up on 06/07/2017.   Specialty:  Behavioral Health Why:  Tuesday at 1:30 with Abran Richard for your hospital follow up appointment Contact information: [place] [address] [phone]           Next level of care provider has access to [place] and Suicide Prevention discussed: Yes,  yes  Have you used any form of tobacco in the last 30 days? (Cigarettes, Smokeless Tobacco, Cigars, and/or Pipes): No  Has patient been referred to the Quitline?: N/A patient is not a smoker  Patient has been referred for addiction treatment: Pt. refused referral  Trish Mage, LCSW 05/31/2017, 10:07 AM

## 2017-05-31 NOTE — Progress Notes (Signed)
Pt is awake at this time, she is complaining of foot pain, but it is too early for the Tylenol.  She was given Vistaril 50 mg to help her relax and go back to sleep.  Pt was agreeable and cooperative.  Continue 1:1 for safety.  Sitter with pt.  Pt safe at this time.

## 2017-05-31 NOTE — Plan of Care (Signed)
Pt attended one recreation therapy group session and had a hard time staying on task, needed constant redirection.   Victorino Sparrow, LRT/CTRS

## 2017-05-31 NOTE — Progress Notes (Signed)
1:1 progress note:  Pt is in the bathroom at this time.  She voices no needs or complaints.  She says she is going to lie back down after using the bathroom.  Breakfast will be brought back for her so that she does not have to make the trip to the cafeteria.  Continue 1:1 for safety d/t high fall risk.  Sitter at pt's bedside.  Pt remains safe at this time.

## 2017-05-31 NOTE — BHH Suicide Risk Assessment (Signed)
[facility] Discharge Suicide Risk Assessment   Principal Problem: Schizoaffective disorder, bipolar type [facility]) Discharge Diagnoses:  Patient Active Problem List   Diagnosis Date Noted  . Multiple thyroid nodules [E04.2] 05/28/2017  . Sinus tachycardia [R00.0] 05/20/2017  . Overdose [T50.901A] 05/20/2017  . Chronic diastolic CHF (congestive heart failure) ([place]) [I50.32] 05/20/2017  . Injury of right sciatic nerve [S74.01XA] 04/29/2017  . AKI (acute kidney injury) ([place]) [N17.9] 04/25/2017  . Shock liver [K72.00] 04/10/2017  . Rhabdomyolysis [M62.82] 04/10/2017  . Bipolar 1 disorder ([place]) [F31.9] 04/10/2017  . Acute respiratory failure ([place]) [J96.00]   . Acute encephalopathy [G93.40] 04/05/2017  . Pituitary microadenoma ([place]) [D35.2] 04/25/2015  . OSA (obstructive sleep apnea) [G47.33] 04/16/2015  . Obesity [E66.9] 04/16/2015  . Schizoaffective disorder, bipolar type ([place]) [F25.0] 04/15/2015  . Hypertension [I10] 10/05/2012    Total Time spent with patient: 30 minutes  Musculoskeletal: Strength & Muscle Tone: within normal limits Gait & Station: using wheelchair Patient leans: N/A  Psychiatric Specialty Exam: Review of Systems  Constitutional: Negative for chills and fever.  Respiratory: Negative for cough and shortness of breath.   Cardiovascular: Negative for chest pain.  Gastrointestinal: Negative for abdominal pain, heartburn, nausea and vomiting.  Psychiatric/Behavioral: Positive for hallucinations. Negative for depression and suicidal ideas. The patient is not nervous/anxious and does not have insomnia.     Blood pressure 131/87, pulse (!) 107, temperature 98.9 F (37.2 C), temperature source Oral, resp. rate 20, height 5' (1.524 m), weight 97.5 kg (215 lb), last menstrual period 05/15/2017, SpO2 97 %.Body mass index is 41.99 kg/m.  General Appearance: Casual and Fairly Groomed  Engineer, water::  Good  Speech:  Clear and Coherent and Normal Rate  Volume:  Normal  Mood:   Euthymic  Affect:  Congruent and Flat  Thought Process:  Coherent and Goal Directed  Orientation:  Full (Time, Place, and Person)  Thought Content:  Hallucinations: Auditory  Suicidal Thoughts:  No  Homicidal Thoughts:  No  Memory:  Immediate;   Fair Recent;   Fair Remote;   Fair  Judgement:  Fair  Insight:  Lacking  Psychomotor Activity:  Normal  Concentration:  Fair  Recall:  [employer] [place]  Language: Fair  Akathisia:  No  Handed:    AIMS (if indicated):     Assets:  Financial Resources/Insurance Resilience Social Support  Sleep:  Number of Hours: 3.5  Cognition: WNL  ADL's:  Intact   Mental Status Per Nursing Assessment::   On Admission:  NA  Demographic Factors:  Low socioeconomic status and Unemployed  Loss Factors: Financial problems/change in socioeconomic status  Historical Factors: Family history of mental illness or substance abuse and Impulsivity  Risk Reduction Factors:   Living with another person, especially a relative, Positive social support, Positive therapeutic relationship and Positive coping skills or problem solving skills  Continued Clinical Symptoms:  Severe Anxiety and/or Agitation Schizophrenia:   Paranoid or undifferentiated type More than one psychiatric diagnosis Currently Psychotic Previous Psychiatric Diagnoses and Treatments  Cognitive Features That Contribute To Risk:  None    Suicide Risk:  Minimal: No identifiable suicidal ideation.  Patients presenting with no risk factors but with morbid ruminations; may be classified as minimal risk based on the severity of the depressive symptoms  [place], [place] Follow up on 06/07/2017.   Specialty:  Behavioral Health Why:  Tuesday at 1:30 with Abran Richard for your hospital follow up appointment Contact information: [place]  [address] [place] [address] [phone]         Subjective Data:  Corda Shutt is  a 47 y/o F with history of schizoaffective disorder, bipolar type who was admitted from [place] on IVC after she presented brought in by EMS when she was found down by a friend in her apartment and only responsive to pain from an apparent overdose of her prescription medications. Pt reported she was unsure what she had taken and her intention was not to overdose. She was found with prescriptions near here of abilify, amiodarone, klonopin, doxepin, metoprolol, seroquel, and clozapine. Pt has recent history of admission to [facility] from 04/05/17 - 05/12/17 for similar presentation and treatment for overdose of her medications when she was found down which required stabilization via intubation and hemodialysis. Pt was placed on IVC after medical stabilization and she was transferred to [facility] for additional treatment and stabilization. As per chart review pt had been undergoing medication changes with her outpatient provider and transitioning from [place] to clozapine.Upon admission, pt had multiple medications discontinued due to concern for over-sedation (including lunesta, seroquel, klonopin, and doxepin), and her regimen was consolidated to clozapine at starting dose with plan to titrate up the dose. PTwasconsulted to follow patient to assist with building strength and ambulation.Pt had tachycardia and QTc prolongation, so clozapine was stopped and she was changed to haldol. She had low potassium and magnesium, which have been supplemented with plan to recheck CMP on 4/29. Pt has been reporting incremental improvement of her presenting mood and psychotic symptoms. She was evaluated in ED for fall on 4/27 and medically cleared to return to [facility] with recommendation to start metoprolol for tachycardia. Pt has been reporting improvement of her presenting symptoms.  Today upon evaluation, pt shares, "No problems - just my foot is sore." She plans to follow up with her orthopedic doctor regarding her  foot. She otherwise denies physical complaints. She is sleeping well as her report, but staff noted only 3.5 hours of sleep. She continues to endorse AH, stating, "They're there, but they say they'll leave me be." She briefly talks to voices during interview, but she is able to track and does not seem distracted. She denies SI/HI/VH. She feels that her medications have been helpful and she is tolerating them well. She is in agreement to continue her current treatment regimen without changes. She was able to engage in safety planning including plan to return to [facility] or contact emergency services if she feels unable to maintain her own safety or the safety of others. Pt had no further questions, comments, or concerns.   Plan Of Care/Follow-up recommendations:   -Discharge to outpatient level of care  -Schizoaffective disorder, bipolar type -Continue haldol 5mg  po qAM + 10mg  po qhs  -Tachycardia             - Continue metoprolol 25mg  po BID as per rec's from ED  -HTN -Continue norvasc 10mg  po qDay  -Seasonal allergies -Continue claritin 10mg  po qDay  -Anxiety -Continue atarax 50mg  po q6h prn anxiety  -Insomnia -Continue trazodone 50mg  po qhs prn insomnia  Activity:  as tolerated Diet:  normal Tests:  NA Other:  see above for [place], MD 05/31/2017, 10:15 AM

## 2017-05-31 NOTE — Progress Notes (Signed)
Recreation Therapy Notes  Date: 4.30.19 Time: 1000 Location: 500 Hall Dayroom  Group Topic: Self-Esteem  Goal Area(s) Addresses:  Patient will successfully identify positive attributes about themselves.  Patient will successfully identify benefit of improved self-esteem.   Intervention: Magazines, colored pencils, markers, scissors, glue  Activity: Advertisement.  Patients were to create an advertisement highlighting their positive qualities.  Education:  Self-Esteem, Dentist.   Education Outcome: Acknowledges education/In group clarification offered/Needs additional education  Clinical Observations/Feedback: Pt did not attend group.    Victorino Sparrow, LRT/CTRS         Victorino Sparrow A 05/31/2017 11:53 AM

## 2017-06-04 ENCOUNTER — Other Ambulatory Visit: Payer: Self-pay

## 2017-06-04 ENCOUNTER — Encounter (HOSPITAL_COMMUNITY): Payer: Self-pay | Admitting: Emergency Medicine

## 2017-06-04 ENCOUNTER — Emergency Department (HOSPITAL_COMMUNITY)
Admission: EM | Admit: 2017-06-04 | Discharge: 2017-06-04 | Disposition: A | Discharge status: Home / Self Care | Payer: Medicaid Other | Attending: Emergency Medicine | Admitting: Emergency Medicine

## 2017-06-04 DIAGNOSIS — I5032 Chronic diastolic (congestive) heart failure: Secondary | ICD-10-CM | POA: Insufficient documentation

## 2017-06-04 DIAGNOSIS — I110 Hypertensive heart disease with heart failure: Secondary | ICD-10-CM | POA: Diagnosis not present

## 2017-06-04 DIAGNOSIS — Z79899 Other long term (current) drug therapy: Secondary | ICD-10-CM | POA: Diagnosis not present

## 2017-06-04 DIAGNOSIS — M79604 Pain in right leg: Secondary | ICD-10-CM | POA: Diagnosis not present

## 2017-06-04 LAB — COMPREHENSIVE METABOLIC PANEL
ALK PHOS: 60 U/L (ref 38–126)
ALT: 30 U/L (ref 14–54)
ANION GAP: 12 (ref 5–15)
AST: 22 U/L (ref 15–41)
Albumin: 3.9 g/dL (ref 3.5–5.0)
BILIRUBIN TOTAL: 0.7 mg/dL (ref 0.3–1.2)
BUN: 10 mg/dL (ref 6–20)
CALCIUM: 10 mg/dL (ref 8.9–10.3)
CO2: 23 mmol/L (ref 22–32)
CREATININE: 1.01 mg/dL — AB (ref 0.44–1.00)
Chloride: 104 mmol/L (ref 101–111)
GFR calc Af Amer: >60 mL/min (ref >60)
GFR calc non Af Amer: >60 mL/min (ref >60)
Glucose, Bld: 114 mg/dL — ABNORMAL HIGH (ref 65–99)
Potassium: 3.4 mmol/L — ABNORMAL LOW (ref 3.5–5.1)
Sodium: 139 mmol/L (ref 135–145)
TOTAL PROTEIN: 7.8 g/dL (ref 6.5–8.1)

## 2017-06-04 LAB — CBC WITH DIFFERENTIAL/PLATELET
BASOS ABS: 0 10*3/uL (ref 0.0–0.1)
BASOS PCT: 0 %
EOS ABS: 0.1 10*3/uL (ref 0.0–0.7)
Eosinophils Relative: 1 %
HCT: 37.8 % (ref 36.0–46.0)
Hemoglobin: 12.4 g/dL (ref 12.0–15.0)
Lymphocytes Relative: 25 %
Lymphs Abs: 2.7 10*3/uL (ref 0.7–4.0)
MCH: 28.6 pg (ref 26.0–34.0)
MCHC: 32.8 g/dL (ref 30.0–36.0)
MCV: 87.1 fL (ref 78.0–100.0)
MONOS PCT: 6 %
Monocytes Absolute: 0.6 10*3/uL (ref 0.1–1.0)
NEUTROS ABS: 7.2 10*3/uL (ref 1.7–7.7)
NEUTROS PCT: 68 %
Platelets: 380 10*3/uL (ref 150–400)
RBC: 4.34 MIL/uL (ref 3.87–5.11)
RDW: 13.3 % (ref 11.5–15.5)
WBC: 10.7 10*3/uL — ABNORMAL HIGH (ref 4.0–10.5)

## 2017-06-04 LAB — ETHANOL

## 2017-06-04 LAB — CK: CK TOTAL: 121 U/L (ref 38–234)

## 2017-06-04 MED ORDER — SODIUM CHLORIDE 0.9 % IV BOLUS
1000.0000 mL | Freq: Once | INTRAVENOUS | Status: AC
  Administered 2017-06-04: 1000 mL via INTRAVENOUS

## 2017-06-04 MED ORDER — SODIUM CHLORIDE 0.9 % IV SOLN
INTRAVENOUS | Status: DC
  Administered 2017-06-04: 13:00:00 via INTRAVENOUS

## 2017-06-04 MED ORDER — GABAPENTIN 300 MG PO CAPS: 300.0000 mg | ORAL_CAPSULE | Freq: Three times a day (TID) | ORAL | 0 refills | Status: DC

## 2017-06-04 NOTE — Discharge Instructions (Addendum)
The pain in your leg should improve by taking gabapentin 300 mg 3 times a day.  You were prescribed that previously but is not currently taking it.  Please start taking it as soon as possible.  Call Dr. Naaman Plummer, for a follow-up appointment as soon as possible.  Follow-up with your psychiatrist for further care and treatment as scheduled, and as needed.

## 2017-06-04 NOTE — ED Triage Notes (Signed)
Pt BIB sister for increased rt leg pain. Pt recently seen for rhabdo and rt sided foot drop. Pt states rt leg pain has increased up to her thigh. Pt slow to respond to questions, but A&Ox4. Resp e/u. NAD.

## 2017-06-04 NOTE — ED Provider Notes (Signed)
[facility] EMERGENCY DEPARTMENT Provider Note   CSN: [id number] Arrival date & time: 06/04/17  1135     History   Chief Complaint Chief Complaint  Patient presents with  . Leg Pain    HPI Jenna Hill is a 48 y.o. female.  HPI   The patient is here for evaluation of right leg discomfort which has been present since May 20, 2017.  Subsequent to that she was admitted, and ultimately discharged from the hospital, and transferred to a psychiatric facility.  During the psychiatric stay she was evaluated in the emergency department for a fall, when she had her right leg evaluated with radiologic images.  Prior to that while in the hospital she had comprehensive evaluation for right foot drop, and possible lumbar source of the disorder.  Patient came here today by private vehicle.  She states that her "nerves are out of control."  She denies headache, chest pain, back pain, abdominal pain, focal weakness, or inability to walk.  She is using a walker secondary to the right leg and foot problem.  Her right leg feels numb.  She also notes numbness in her entire back.  There are no other known modifying factors.   Past Medical History:  Diagnosis Date  . Bipolar 1 disorder ([place])   . Difficult intubation   . Hypertension   . PONV (postoperative nausea and vomiting)   . Schizophrenia ([place])   . Spinal headache     Patient Active Problem List   Diagnosis Date Noted  . Multiple thyroid nodules 05/28/2017  . Sinus tachycardia 05/20/2017  . Overdose 05/20/2017  . Chronic diastolic CHF (congestive heart failure) ([place]) 05/20/2017  . 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 AKI (acute kidney injury) ([place]) 04/25/2017  . Shock liver 04/10/2017  . Rhabdomyolysis 04/10/2017  . Bipolar 1 disorder ([place]) 04/10/2017  . Acute respiratory failure ([place])   . Acute encephalopathy 04/05/2017  . Pituitary microadenoma ([place]) 04/25/2015  . OSA (obstructive sleep apnea) 04/16/2015    . Obesity 04/16/2015  . Schizoaffective disorder, bipolar type ([place]) 04/15/2015  . Hypertension 10/05/2012    Past Surgical History:  Procedure Laterality Date  . ANKLE ARTHROSCOPY Right   . CESAREAN SECTION    . HAND SURGERY Right   . ORIF ANKLE FRACTURE Left 01/29/2016   Procedure: OPEN REDUCTION INTERNAL FIXATION LEFT ANKLE BIMALLEOLAR FRACTURE AND SYNDESMOSIS;  Surgeon: Wylene Simmer, MD;  Location: [facility];  Service: Orthopedics;  Laterality: Left;     OB History   None      Home Medications    Prior to Admission medications   Medication Sig Start Date End Date Taking? Authorizing Provider  amLODipine (NORVASC) 10 MG tablet Take 1 tablet (10 mg total) by mouth daily. For high blood pressure 05/31/17  Yes Nwoko, Agnes I, NP  haloperidol (HALDOL) 5 MG tablet Take 1 tablet (5 mg) by mouth each morning & 2 tablets (10 mg) at bedtime: For mood control Patient taking differently: Take 5-10 mg by mouth See admin instructions. Take 1 tablet (5 mg) by mouth each morning & 2 tablets (10 mg) at bedtime: For mood control 05/31/17  Yes Lindell Spar I, NP  hydrOXYzine (ATARAX/VISTARIL) 50 MG tablet Take 1 tablet (50 mg total) by mouth every 6 (six) hours as needed for anxiety. 05/31/17  Yes Lindell Spar I, NP  loratadine (CLARITIN) 10 MG tablet Take 1 tablet (10 mg total) by mouth daily. (May buy from over the counter):  For allergies 06/01/17  Yes Lindell Spar I, NP  metoprolol tartrate (LOPRESSOR) 25 MG tablet Take 1 tablet (25 mg total) by mouth 2 (two) times daily. For high blood pressure Patient taking differently: Take 25 mg by mouth daily. For high blood pressure 05/31/17  Yes Nwoko, Herbert Pun I, NP  traZODone (DESYREL) 100 MG tablet Take 1 tablet (100 mg) by mouth at bedtime: For mood control 05/31/17  Yes Nwoko, Herbert Pun I, NP  gabapentin (NEURONTIN) 300 MG capsule Take 1 capsule (300 mg total) by mouth 3 (three) times daily. For agitation 06/04/17   Daleen Bo, MD    Family  History Family History  Problem Relation Age of Onset  . Healthy Mother   . Healthy Father   . Healthy Sister     Social History Social History   Tobacco Use  . Smoking status: Never Smoker  . Smokeless tobacco: Never Used  Substance Use Topics  . Alcohol use: Yes    Comment: Denies  . Drug use: No    Comment: Denies     Allergies   Bee venom; Codeine; Other; Toradol [ketorolac tromethamine]; Lortab [hydrocodone-acetaminophen]; Percocet [oxycodone-acetaminophen]; Ibuprofen; and Latex   Review of Systems Review of Systems  All other systems reviewed and are negative.    Physical Exam Updated Vital Signs BP (!) 151/99   Pulse (!) 117   Temp 99 F (37.2 C) (Oral)   Resp 15   Ht 5\' 3"  (1.6 m)   Wt 101.6 kg (224 lb)   LMP 05/15/2017   SpO2 100%   BMI 39.68 kg/m   Physical Exam  Constitutional: She is oriented to person, place, and time. She appears well-developed. She appears distressed (She is uncomfortable).  Obese, she appears older than stated age.  HENT:  Head: Normocephalic and atraumatic.  Eyes: Pupils are equal, round, and reactive to light. Conjunctivae and EOM are normal.  Neck: Normal range of motion and phonation normal. Neck supple.  Cardiovascular: Normal rate and regular rhythm.  Pulmonary/Chest: Effort normal and breath sounds normal. She exhibits no tenderness.  Abdominal: Soft. She exhibits no distension. There is no tenderness. There is no guarding.  Musculoskeletal: Normal range of motion. She exhibits no edema, tenderness or deformity.  Neurological: She is alert and oriented to person, place, and time. She exhibits normal muscle tone.  Tremulous.  Right foot drop present.  Skin: Skin is warm and dry.  Psychiatric: Her behavior is normal. Judgment and thought content normal.  Anxious  Nursing note and vitals reviewed.    ED Treatments / Results  Labs (all labs ordered are listed, but only abnormal results are displayed) Labs  Reviewed  COMPREHENSIVE METABOLIC PANEL - Abnormal; Notable for the following components:      Result Value   Potassium 3.4 (*)    Glucose, Bld 114 (*)    Creatinine, Ser 1.01 (*)    All other components within normal limits  CBC WITH DIFFERENTIAL/PLATELET - Abnormal; Notable for the following components:   WBC 10.7 (*)    All other components within normal limits  ETHANOL  CK    EKG None  Radiology No results found.  Procedures Procedures (including critical care time)  Medications Ordered in ED Medications  sodium chloride 0.9 % bolus 1,000 mL (0 mLs Intravenous Stopped 06/04/17 1718)     Initial Impression / Assessment and Plan / ED Course  I have reviewed the triage vital signs and the nursing notes.  Pertinent labs & imaging results that were  available during my care of the patient were reviewed by me and considered in my medical decision making (see chart for details).  Clinical Course as of Jun 04 2204  Sat Jun 04, 2017  1810 Normal  Ethanol [EW]  1810 Normal  CK [EW]  1810 Normal except elevated white count 10.7  CBC with Differential(!) [EW]  1810 Normal except low potassium 3.4, height glucose 114, high creatinine 1.0  Comprehensive metabolic panel(!) [EW]  1102 Not obtained despite treatment with IV fluids.  Urinalysis, Routine w reflex microscopic [EW]    Clinical Course User Index [EW] Daleen Bo, MD     Patient Vitals for the past 24 hrs:  BP Temp Temp src Pulse Resp SpO2 Height Weight  06/04/17 1830 (!) 151/99 - - (!) 117 15 100 % - -  06/04/17 1600 (!) 138/98 - - (!) 117 (!) 25 98 % - -  06/04/17 1530 (!) 150/92 - - (!) 121 (!) 21 98 % - -  06/04/17 1500 (!) 146/89 - - (!) 110 19 100 % - -  06/04/17 1430 (!) 159/93 - - (!) 112 (!) 23 99 % - -  06/04/17 1330 (!) 143/91 - - (!) 112 (!) 24 99 % - -  06/04/17 1200 (!) 144/101 - - (!) 124 (!) 27 99 % - -  06/04/17 1153 - - - - - - 5\' 3"  (1.6 m) 101.6 kg (224 lb)  06/04/17 1147 (!) 144/111 99  F (37.2 C) Oral (!) 128 16 100 % - -    Chart record review indicates patient was evaluated and treated in rehab, discharged in early April, with part of work-up indicating right foot drop, secondary to rhabdomyolysis, but not compartment syndrome.  At that time follow-up was recommended for EMG/NCS testing.  She was given a prescription for gabapentin, only once per day.  She was referred to physical medicine and family medicine for follow-up care.   Follow-up Information    Meredith Staggers, MD Follow up.   Specialty:  Physical Medicine and Rehabilitation Why:  Office will call you with follow up appointment Contact information: [place] [address] [phone]        Alfonse Spruce, FNP Follow up.   Specialty:  Family Medicine Why:  Sonia Baller will call Ms. Hairston's office on Monday to get you a post hospital follow up visit and will then call you with appointment date and time. Contact information: [place] [address] [phone]    Medical decision making-nonspecific ongoing right leg pain left foot drop.  No evidence for new findings, this is a subacute finding present for several weeks.  Doubt spinal myelopathy, cellulitis, metabolic instability or impending vascular collapse.    At discharge- reevaluation with update and discussion. After initial assessment and treatment, an updated evaluation reveals she is fairly comfortable complaints.  Findings discussed with the patient and all questions were answered. Daleen Bo   Nursing Notes Reviewed/ Care Coordinated Applicable Imaging Reviewed Interpretation of Laboratory Data incorporated into ED treatment  The patient appears reasonably screened and/or stabilized for discharge and I doubt any other medical condition or other [facility] requiring further screening, evaluation, or treatment in the ED at this time prior to discharge.  Plan: Home Medications-continue usual  medications; Home Treatments-rest, fluids; return here if the recommended treatment, does not improve the symptoms; Recommended follow up-PCP and physical medicine follow-up as soon as possible for further care and treatment  Final Clinical Impressions(s) / ED Diagnoses   Final diagnoses:  Right leg pain    ED Discharge Orders        Ordered    gabapentin (NEURONTIN) 300 MG capsule  3 times daily     06/04/17 1823       Daleen Bo, MD 06/04/17 2209

## 2017-06-24 ENCOUNTER — Ambulatory Visit: Payer: Medicaid Other | Attending: Family Medicine | Admitting: Family Medicine

## 2017-06-24 VITALS — BP 140/98 | HR 109 | Temp 98.8°F | Resp 16 | Ht 63.0 in | Wt 219.8 lb

## 2017-06-24 DIAGNOSIS — I110 Hypertensive heart disease with heart failure: Secondary | ICD-10-CM | POA: Diagnosis not present

## 2017-06-24 DIAGNOSIS — E669 Obesity, unspecified: Secondary | ICD-10-CM | POA: Insufficient documentation

## 2017-06-24 DIAGNOSIS — Z91040 Latex allergy status: Secondary | ICD-10-CM | POA: Insufficient documentation

## 2017-06-24 DIAGNOSIS — E042 Nontoxic multinodular goiter: Secondary | ICD-10-CM | POA: Diagnosis not present

## 2017-06-24 DIAGNOSIS — Z09 Encounter for follow-up examination after completed treatment for conditions other than malignant neoplasm: Secondary | ICD-10-CM | POA: Insufficient documentation

## 2017-06-24 DIAGNOSIS — M21371 Foot drop, right foot: Secondary | ICD-10-CM | POA: Diagnosis not present

## 2017-06-24 DIAGNOSIS — F319 Bipolar disorder, unspecified: Secondary | ICD-10-CM

## 2017-06-24 DIAGNOSIS — D352 Benign neoplasm of pituitary gland: Secondary | ICD-10-CM | POA: Insufficient documentation

## 2017-06-24 DIAGNOSIS — E876 Hypokalemia: Secondary | ICD-10-CM | POA: Insufficient documentation

## 2017-06-24 DIAGNOSIS — H539 Unspecified visual disturbance: Secondary | ICD-10-CM | POA: Diagnosis not present

## 2017-06-24 DIAGNOSIS — H547 Unspecified visual loss: Secondary | ICD-10-CM | POA: Diagnosis not present

## 2017-06-24 DIAGNOSIS — Z885 Allergy status to narcotic agent status: Secondary | ICD-10-CM | POA: Diagnosis not present

## 2017-06-24 DIAGNOSIS — R82998 Other abnormal findings in urine: Secondary | ICD-10-CM

## 2017-06-24 DIAGNOSIS — M79604 Pain in right leg: Secondary | ICD-10-CM | POA: Insufficient documentation

## 2017-06-24 DIAGNOSIS — F250 Schizoaffective disorder, bipolar type: Secondary | ICD-10-CM | POA: Diagnosis not present

## 2017-06-24 DIAGNOSIS — G9340 Encephalopathy, unspecified: Secondary | ICD-10-CM | POA: Insufficient documentation

## 2017-06-24 DIAGNOSIS — Z886 Allergy status to analgesic agent status: Secondary | ICD-10-CM | POA: Diagnosis not present

## 2017-06-24 DIAGNOSIS — I10 Essential (primary) hypertension: Secondary | ICD-10-CM | POA: Diagnosis not present

## 2017-06-24 DIAGNOSIS — Z79899 Other long term (current) drug therapy: Secondary | ICD-10-CM | POA: Insufficient documentation

## 2017-06-24 DIAGNOSIS — I5032 Chronic diastolic (congestive) heart failure: Secondary | ICD-10-CM | POA: Insufficient documentation

## 2017-06-24 DIAGNOSIS — R000 Tachycardia, unspecified: Secondary | ICD-10-CM | POA: Insufficient documentation

## 2017-06-24 DIAGNOSIS — G4733 Obstructive sleep apnea (adult) (pediatric): Secondary | ICD-10-CM | POA: Insufficient documentation

## 2017-06-24 DIAGNOSIS — R824 Acetonuria: Secondary | ICD-10-CM | POA: Insufficient documentation

## 2017-06-24 DIAGNOSIS — Z888 Allergy status to other drugs, medicaments and biological substances status: Secondary | ICD-10-CM | POA: Diagnosis not present

## 2017-06-24 DIAGNOSIS — Z91030 Bee allergy status: Secondary | ICD-10-CM | POA: Diagnosis not present

## 2017-06-24 LAB — POCT URINALYSIS DIPSTICK
GLUCOSE UA: NEGATIVE
Nitrite, UA: NEGATIVE
Protein, UA: POSITIVE — AB
RBC UA: NEGATIVE
Spec Grav, UA: 1.02 (ref 1.010–1.025)
Urobilinogen, UA: 1 E.U./dL
pH, UA: 6.5 (ref 5.0–8.0)

## 2017-06-24 LAB — POCT URINE PREGNANCY: PREG TEST UR: NEGATIVE

## 2017-06-24 MED ORDER — AMLODIPINE BESYLATE 10 MG PO TABS: 10.0000 mg | ORAL_TABLET | Freq: Every day | ORAL | 1 refills | Status: DC

## 2017-06-24 MED ORDER — GABAPENTIN 300 MG PO CAPS: 300.0000 mg | ORAL_CAPSULE | Freq: Four times a day (QID) | ORAL | 0 refills | Status: DC

## 2017-06-24 MED ORDER — METOPROLOL SUCCINATE ER 25 MG PO TB24: 25.0000 mg | ORAL_TABLET | Freq: Every day | ORAL | 1 refills | Status: DC

## 2017-06-24 NOTE — Progress Notes (Signed)
Patient ID: Jenna Hill, female    DOB: 08/06/1970, 47 y.o.   MRN: [id number]  PCP: Alfonse Spruce, FNP  Chief Complaint  Patient presents with   Medication Refill   . Hospitalization Follow-up    Subjective:  HPI Jenna Hill is a 47 y.o. female presents for hospital follow-up and medication refills.   Review problem list for extensive list of active medical problems.  Pa[place] presented to the [facility] emergency on 06/04/2017 with a complaint of on-going chronic right leg pain and right foot drop which as progressed over the course of the last 3 months. ED provder recommended follow-up with physical medication. Patient recently received evaluation by [facility] here in [place] and reports that the practice has referred her to physical medicine for therapy and further evaluation of on-going condition. Reports [facility] notified her that her pain was not ortho in nature and likely neurological. She is currently prescribed Gabapentin, although reports today that she is out of medication. Reports that the Gabapentin helped minimally although she has not been consistently compliant with taking medication as directed. Current her right leg and foot is in an immobilizer which is provider stability and assisting with improvement of her gait, although her pain persists. She has an initial appointment scheduled with physical medicine on 07/07/2017.  Hypertension, Med-Refill  [name] suffers from Hypertension. No routine home monitoring of blood pressure. She is inactive of physical activity due to chronic medical conditions. Current Body mass index is 38.94 kg/m. She has been without chronic blood pressure medications for approximately 2 weeks. Denies shortness of breath, chest pain, headache, dizziness, palpitations, or blurring of vision. Requests ophthalmology referral as she reports difficulty with visual perception with current corrective lenses  prescription. Social History   Socioeconomic History  . Marital status: Married    Spouse name: Not on file  . Number of children: Not on file  . Years of education: Not on file  . Highest education level: Not on file  Occupational History  . Not on file  Social Needs  . Financial resource strain: Not on file  . Food insecurity:    Worry: Not on file    Inability: Not on file  . Transportation needs:    Medical: Not on file    Non-medical: Not on file  Tobacco Use  . Smoking status: Never Smoker  . Smokeless tobacco: Never Used  Substance and Sexual Activity  . Alcohol use: Yes    Comment: Denies  . Drug use: No    Comment: Denies  . Sexual activity: Not Currently    Birth control/protection: None  Lifestyle  . Physical activity:    Days per week: Not on file    Minutes per session: Not on file  . Stress: Not on file  Relationships  . Social connections:    Talks on phone: Not on file    Gets together: Not on file    Attends religious service: Not on file    Active member of club or organization: Not on file    Attends meetings of clubs or organizations: Not on file    Relationship status: Not on file  . Intimate partner violence:    Fear of current or ex partner: Not on file    Emotionally abused: Not on file    Physically abused: Not on file    Forced sexual activity: Not on file  Other Topics Concern  . Not on file  Social History Narrative  .  Not on file    Family History  Problem Relation Age of Onset  . Healthy Mother   . Healthy Father   . Healthy Sister    Review of Systems Pertinent negatives listed in HPI Patient Active Problem List   Diagnosis Date Noted  . Multiple thyroid nodules 05/28/2017  . Sinus tachycardia 05/20/2017  . Overdose 05/20/2017  . Chronic diastolic CHF (congestive heart failure) ([place]) 05/20/2017  . 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 AKI (acute kidney injury) ([place]) 04/25/2017  . Shock liver 04/10/2017  .  Rhabdomyolysis 04/10/2017  . Bipolar 1 disorder ([place]) 04/10/2017  . Acute respiratory failure ([place])   . Acute encephalopathy 04/05/2017  . Pituitary microadenoma ([place]) 04/25/2015  . OSA (obstructive sleep apnea) 04/16/2015  . Obesity 04/16/2015  . Schizoaffective disorder, bipolar type ([place]) 04/15/2015  . Hypertension 10/05/2012    Allergies  Allergen Reactions  . Bee Venom Anaphylaxis  . Codeine Swelling    Facial swelling  . Other Anaphylaxis    bertrillium or bertillium    . Toradol [Ketorolac Tromethamine] Swelling    Tongue swelling  . Lortab [Hydrocodone-Acetaminophen] Hives  . Percocet [Oxycodone-Acetaminophen] Hives  . Ibuprofen     Raised hives Also with naproxen  . Latex Rash    Prior to Admission medications   Medication Sig Start Date End Date Taking? Authorizing Provider  amLODipine (NORVASC) 10 MG tablet Take 1 tablet (10 mg total) by mouth daily. For high blood pressure 05/31/17  Yes Nwoko, Agnes I, NP  gabapentin (NEURONTIN) 300 MG capsule Take 1 capsule (300 mg total) by mouth 3 (three) times daily. For agitation 06/04/17  Yes Daleen Bo, MD  loratadine (CLARITIN) 10 MG tablet Take 1 tablet (10 mg total) by mouth daily. (May buy from over the counter): For allergies 06/01/17  Yes Lindell Spar I, NP  haloperidol (HALDOL) 5 MG tablet Take 1 tablet (5 mg) by mouth each morning & 2 tablets (10 mg) at bedtime: For mood control Patient not taking: Reported on 06/24/2017 05/31/17   Lindell Spar I, NP  hydrOXYzine (ATARAX/VISTARIL) 50 MG tablet Take 1 tablet (50 mg total) by mouth every 6 (six) hours as needed for anxiety. Patient not taking: Reported on 06/24/2017 05/31/17   Lindell Spar I, NP  metoprolol tartrate (LOPRESSOR) 25 MG tablet Take 1 tablet (25 mg total) by mouth 2 (two) times daily. For high blood pressure Patient not taking: Reported on 06/24/2017 05/31/17   Lindell Spar I, NP  traZODone (DESYREL) 100 MG tablet Take 1 tablet (100 mg) by mouth at bedtime: For  mood control Patient not taking: Reported on 06/24/2017 05/31/17   Encarnacion Slates, NP    Past Medical, Surgical Family and Social History reviewed and updated.    Objective:   Today's Vitals   06/24/17 1431  BP: (!) 140/98  Pulse: (!) 109  Resp: 16  Temp: 98.8 F (37.1 C)  TempSrc: Oral  Weight: 219 lb 12.8 oz (99.7 kg)  Height: 5\' 3"  (1.6 m)  PainSc: 10-Worst pain ever    Wt Readings from Last 3 Encounters:  06/24/17 219 lb 12.8 oz (99.7 kg)  06/04/17 224 lb (101.6 kg)  05/23/17 224 lb 3.3 oz (101.7 kg)    Physical Exam  Constitutional: She appears well-developed and well-nourished.  Cardiovascular: Normal rate, regular rhythm, normal heart sounds and intact distal pulses.  Pulmonary/Chest: Effort normal and breath sounds normal.  Musculoskeletal:  Right leg and foot in CAM walker  Neurological: She is  alert.  Skin: Skin is warm and dry.  Psychiatric: Her speech is normal and behavior is normal. Judgment and thought content normal. Cognition and memory are normal.  Flat affect    Assessment & Plan:  1. Essential hypertension, uncontrolled today. We have discussed target BP range and blood pressure goal. I have advised patient to check BP regularly and to call us back or report to clinic if the numbers are consistently higher than 140/90. We discussed the importance of compliance with medical therapy and DASH diet recommended, consequences of uncontrolled hypertension discussed. Refilled currently prescribed antihypertension medications and encouraged consistently taking medication as prescribed. No medication changes today.  2. Right foot drop, currently followed by [facility]. Referred by orthopedics to physical therapy. For pain, currently prescribed Gabapentin. Refilled medication today and increased frequency to 4 times daily as needed for pain.  3. Tachycardia, chronic problem. Urine HCG, negative. Continue metoprolol 25 mg ER once daily for rate control.   4.  Impaired visual perception, currently prescribed corrective lenses. Provided information to schedule an appointment with opthalmology for a complete comprehensive eye exam.  5. Hypokalemia, last potassium 3.4. Checking comprehensive metabolic panel  6. Urine leukocytes, urine culture pending. Patient is asymptomatic. Urine dipstick + leukocytes.  7. Urine ketones, + positive in UA. Elevated glucose during prior impatient admission. Checking a  Hemoglobin A1C today to rule out diabetes. Risk factors include sedentary lifestyle, obesity(Body mass index is 38.94 kg/m.) and cardiovascular disease.  ,    8. Bipolar 1 disorder ([place]) Depression screen [facility] 2/9 06/24/2017 03/05/2016  Decreased Interest 0 2  Down, Depressed, Hopeless 0 3  PHQ - 2 Score 0 5  Altered sleeping 2 3  Tired, decreased energy 0 2  Change in appetite 0 0  Feeling bad or failure about yourself  0 0  Trouble concentrating 0 2  Moving slowly or fidgety/restless 0 2  Suicidal thoughts 0 0  PHQ-9 Score 2 14     Meds ordered this encounter  Medications  . amLODipine (NORVASC) 10 MG tablet    Sig: Take 1 tablet (10 mg total) by mouth daily. For high blood pressure    Dispense:  90 tablet    Refill:  1    Order Specific Question:   Supervising Provider    Answer:   Tresa Garter [id number]  . gabapentin (NEURONTIN) 300 MG capsule    Sig: Take 1 capsule (300 mg total) by mouth 4 (four) times daily. For agitation    Dispense:  360 capsule    Refill:  0    Order Specific Question:   Supervising Provider    Answer:   Tresa Garter [id number]  . metoprolol succinate (TOPROL-XL) 25 MG 24 hr tablet    Sig: Take 1 tablet (25 mg total) by mouth daily.    Dispense:  90 tablet    Refill:  1    Order Specific Question:   Supervising Provider    Answer:   Tresa Garter [id number]   Orders Placed This Encounter  Procedures  . Urine Culture  . Comprehensive metabolic panel  . Microalbumin/Creatinine Ratio,  Urine  . Hemoglobin A1c  . POCT urine pregnancy  . Urinalysis Dipstick    RTC: 3 months for chronic condition follow-up.  Carroll Sage. Kenton Kingfisher, MSN, [facility] and [facility], [place], [place] [address] [phone]

## 2017-06-24 NOTE — Progress Notes (Signed)
Pt. Is here for a follow-up. Pt. Need medication refill. Pt. Request if she can get a referral for neurology for neuropathy.

## 2017-06-24 NOTE — Patient Instructions (Signed)
Please contact [facility] to schedule an eye exam, [phone]. [place], [place]. Also check with your current eye care provider to see if they will accept medicaid.   I have refilled your medications.

## 2017-06-25 LAB — COMPREHENSIVE METABOLIC PANEL
A/G RATIO: 1.4 (ref 1.2–2.2)
ALBUMIN: 4.1 g/dL (ref 3.5–5.5)
ALK PHOS: 50 IU/L (ref 39–117)
ALT: 19 IU/L (ref 0–32)
AST: 14 IU/L (ref 0–40)
BILIRUBIN TOTAL: 0.3 mg/dL (ref 0.0–1.2)
BUN/Creatinine Ratio: 11 (ref 9–23)
BUN: 11 mg/dL (ref 6–24)
CHLORIDE: 99 mmol/L (ref 96–106)
CO2: 24 mmol/L (ref 20–29)
Calcium: 10 mg/dL (ref 8.7–10.2)
Creatinine, Ser: 1 mg/dL (ref 0.57–1.00)
GFR calc Af Amer: 78 mL/min/{1.73_m2} (ref >59)
GFR, EST NON AFRICAN AMERICAN: 68 mL/min/{1.73_m2} (ref >59)
GLOBULIN, TOTAL: 2.9 g/dL (ref 1.5–4.5)
Glucose: 88 mg/dL (ref 65–99)
Potassium: 3.5 mmol/L (ref 3.5–5.2)
Sodium: 139 mmol/L (ref 134–144)
Total Protein: 7 g/dL (ref 6.0–8.5)

## 2017-06-25 LAB — MICROALBUMIN / CREATININE URINE RATIO
Creatinine, Urine: 334.9 mg/dL
MICROALB/CREAT RATIO: 9 mg/g{creat} (ref 0.0–30.0)
MICROALBUM., U, RANDOM: 30.1 ug/mL

## 2017-06-25 LAB — HEMOGLOBIN A1C
Est. average glucose Bld gHb Est-mCnc: 126 mg/dL
Hgb A1c MFr Bld: 6 % — ABNORMAL HIGH (ref 4.8–5.6)

## 2017-06-26 LAB — URINE CULTURE

## 2017-07-07 ENCOUNTER — Inpatient Hospital Stay (HOSPITAL_COMMUNITY): Payer: Medicaid Other

## 2017-07-07 ENCOUNTER — Inpatient Hospital Stay (HOSPITAL_COMMUNITY)
Admission: EM | Admit: 2017-07-07 | Discharge: 2017-07-11 | DRG: 091 | Disposition: A | Discharge status: Home Health | Payer: Medicaid Other | Attending: Internal Medicine | Admitting: Internal Medicine

## 2017-07-07 ENCOUNTER — Other Ambulatory Visit: Payer: Self-pay

## 2017-07-07 ENCOUNTER — Inpatient Hospital Stay (HOSPITAL_COMMUNITY)
Admit: 2017-07-07 | Discharge: 2017-07-07 | Disposition: A | Discharge status: Home / Self Care | Payer: Medicaid Other | Attending: Pulmonary Disease | Admitting: Pulmonary Disease

## 2017-07-07 ENCOUNTER — Emergency Department (HOSPITAL_COMMUNITY): Payer: Medicaid Other

## 2017-07-07 ENCOUNTER — Encounter (HOSPITAL_COMMUNITY): Payer: Self-pay | Admitting: Emergency Medicine

## 2017-07-07 ENCOUNTER — Ambulatory Visit: Payer: Medicaid Other | Attending: Orthopedic Surgery

## 2017-07-07 DIAGNOSIS — Z79899 Other long term (current) drug therapy: Secondary | ICD-10-CM

## 2017-07-07 DIAGNOSIS — Z91030 Bee allergy status: Secondary | ICD-10-CM | POA: Diagnosis not present

## 2017-07-07 DIAGNOSIS — F250 Schizoaffective disorder, bipolar type: Secondary | ICD-10-CM | POA: Diagnosis present

## 2017-07-07 DIAGNOSIS — Z886 Allergy status to analgesic agent status: Secondary | ICD-10-CM | POA: Diagnosis not present

## 2017-07-07 DIAGNOSIS — D72829 Elevated white blood cell count, unspecified: Secondary | ICD-10-CM | POA: Diagnosis present

## 2017-07-07 DIAGNOSIS — E669 Obesity, unspecified: Secondary | ICD-10-CM | POA: Diagnosis present

## 2017-07-07 DIAGNOSIS — M6282 Rhabdomyolysis: Secondary | ICD-10-CM | POA: Diagnosis present

## 2017-07-07 DIAGNOSIS — Z888 Allergy status to other drugs, medicaments and biological substances status: Secondary | ICD-10-CM | POA: Diagnosis not present

## 2017-07-07 DIAGNOSIS — T4395XA Adverse effect of unspecified psychotropic drug, initial encounter: Secondary | ICD-10-CM | POA: Diagnosis present

## 2017-07-07 DIAGNOSIS — T68XXXA Hypothermia, initial encounter: Secondary | ICD-10-CM | POA: Diagnosis present

## 2017-07-07 DIAGNOSIS — E876 Hypokalemia: Secondary | ICD-10-CM | POA: Diagnosis present

## 2017-07-07 DIAGNOSIS — G92 Toxic encephalopathy: Secondary | ICD-10-CM | POA: Diagnosis present

## 2017-07-07 DIAGNOSIS — G4733 Obstructive sleep apnea (adult) (pediatric): Secondary | ICD-10-CM | POA: Diagnosis present

## 2017-07-07 DIAGNOSIS — Z6838 Body mass index (BMI) 38.0-38.9, adult: Secondary | ICD-10-CM

## 2017-07-07 DIAGNOSIS — G9340 Encephalopathy, unspecified: Secondary | ICD-10-CM | POA: Diagnosis not present

## 2017-07-07 DIAGNOSIS — R4189 Other symptoms and signs involving cognitive functions and awareness: Secondary | ICD-10-CM | POA: Diagnosis present

## 2017-07-07 DIAGNOSIS — I361 Nonrheumatic tricuspid (valve) insufficiency: Secondary | ICD-10-CM | POA: Diagnosis not present

## 2017-07-07 DIAGNOSIS — R000 Tachycardia, unspecified: Secondary | ICD-10-CM | POA: Diagnosis present

## 2017-07-07 DIAGNOSIS — J9600 Acute respiratory failure, unspecified whether with hypoxia or hypercapnia: Secondary | ICD-10-CM | POA: Diagnosis not present

## 2017-07-07 DIAGNOSIS — J811 Chronic pulmonary edema: Secondary | ICD-10-CM | POA: Diagnosis present

## 2017-07-07 DIAGNOSIS — N179 Acute kidney failure, unspecified: Secondary | ICD-10-CM | POA: Diagnosis present

## 2017-07-07 DIAGNOSIS — I10 Essential (primary) hypertension: Secondary | ICD-10-CM | POA: Diagnosis present

## 2017-07-07 DIAGNOSIS — Z91040 Latex allergy status: Secondary | ICD-10-CM

## 2017-07-07 DIAGNOSIS — Z0189 Encounter for other specified special examinations: Secondary | ICD-10-CM

## 2017-07-07 DIAGNOSIS — G629 Polyneuropathy, unspecified: Secondary | ICD-10-CM | POA: Diagnosis present

## 2017-07-07 DIAGNOSIS — Z885 Allergy status to narcotic agent status: Secondary | ICD-10-CM | POA: Diagnosis not present

## 2017-07-07 DIAGNOSIS — D539 Nutritional anemia, unspecified: Secondary | ICD-10-CM | POA: Diagnosis present

## 2017-07-07 DIAGNOSIS — R609 Edema, unspecified: Secondary | ICD-10-CM | POA: Diagnosis not present

## 2017-07-07 DIAGNOSIS — I340 Nonrheumatic mitral (valve) insufficiency: Secondary | ICD-10-CM | POA: Diagnosis not present

## 2017-07-07 LAB — I-STAT CG4 LACTIC ACID, ED: Lactic Acid, Venous: 2.74 mmol/L (ref 0.5–1.9)

## 2017-07-07 LAB — CBC WITH DIFFERENTIAL/PLATELET
BASOS ABS: 0 10*3/uL (ref 0.0–0.1)
Basophils Relative: 0 %
EOS ABS: 0 10*3/uL (ref 0.0–0.7)
Eosinophils Relative: 0 %
HCT: 32.1 % — ABNORMAL LOW (ref 36.0–46.0)
Hemoglobin: 10.9 g/dL — ABNORMAL LOW (ref 12.0–15.0)
Lymphocytes Relative: 13 %
Lymphs Abs: 3 10*3/uL (ref 0.7–4.0)
MCH: 28.6 pg (ref 26.0–34.0)
MCHC: 34 g/dL (ref 30.0–36.0)
MCV: 84.3 fL (ref 78.0–100.0)
MONO ABS: 1.4 10*3/uL — AB (ref 0.1–1.0)
MONOS PCT: 6 %
NEUTROS ABS: 18 10*3/uL — AB (ref 1.7–7.7)
Neutrophils Relative %: 81 %
PLATELETS: 412 10*3/uL — AB (ref 150–400)
RBC: 3.81 MIL/uL — ABNORMAL LOW (ref 3.87–5.11)
RDW: 13.7 % (ref 11.5–15.5)
WBC: 22.4 10*3/uL — ABNORMAL HIGH (ref 4.0–10.5)

## 2017-07-07 LAB — URINALYSIS, ROUTINE W REFLEX MICROSCOPIC
BILIRUBIN URINE: NEGATIVE
GLUCOSE, UA: NEGATIVE mg/dL
HGB URINE DIPSTICK: NEGATIVE
KETONES UR: NEGATIVE mg/dL
LEUKOCYTES UA: NEGATIVE
NITRITE: NEGATIVE
PH: 5 (ref 5.0–8.0)
PROTEIN: 30 mg/dL — AB
Specific Gravity, Urine: 1.019 (ref 1.005–1.030)

## 2017-07-07 LAB — COMPREHENSIVE METABOLIC PANEL
ALT: 38 U/L (ref 14–54)
ANION GAP: 12 (ref 5–15)
AST: 37 U/L (ref 15–41)
Albumin: 3.9 g/dL (ref 3.5–5.0)
Alkaline Phosphatase: 49 U/L (ref 38–126)
BILIRUBIN TOTAL: 1 mg/dL (ref 0.3–1.2)
BUN: 30 mg/dL — ABNORMAL HIGH (ref 6–20)
CALCIUM: 9.1 mg/dL (ref 8.9–10.3)
CO2: 23 mmol/L (ref 22–32)
CREATININE: 1.42 mg/dL — AB (ref 0.44–1.00)
Chloride: 103 mmol/L (ref 101–111)
GFR calc non Af Amer: 44 mL/min — ABNORMAL LOW (ref >60)
GFR, EST AFRICAN AMERICAN: 50 mL/min — AB (ref >60)
GLUCOSE: 128 mg/dL — AB (ref 65–99)
Potassium: 4.5 mmol/L (ref 3.5–5.1)
Sodium: 138 mmol/L (ref 135–145)
TOTAL PROTEIN: 7.9 g/dL (ref 6.5–8.1)

## 2017-07-07 LAB — BLOOD GAS, ARTERIAL
Acid-base deficit: 2 mmol/L (ref 0.0–2.0)
Bicarbonate: 22 mmol/L (ref 20.0–28.0)
Drawn by: 257701
FIO2: 100
LHR: 22 {breaths}/min
MECHVT: 420 mL
O2 Saturation: 99.1 %
PATIENT TEMPERATURE: 98.6
PEEP: 5 cmH2O
PO2 ART: 258 mmHg — AB (ref 83.0–108.0)
pCO2 arterial: 36.8 mmHg (ref 32.0–48.0)
pH, Arterial: 7.393 (ref 7.350–7.450)

## 2017-07-07 LAB — CBG MONITORING, ED: GLUCOSE-CAPILLARY: 143 mg/dL — AB (ref 65–99)

## 2017-07-07 LAB — ETHANOL: Alcohol, Ethyl (B): <10 mg/dL (ref <10)

## 2017-07-07 LAB — MRSA PCR SCREENING: MRSA BY PCR: NEGATIVE

## 2017-07-07 LAB — RAPID URINE DRUG SCREEN, HOSP PERFORMED
Amphetamines: NOT DETECTED
Barbiturates: NOT DETECTED
Benzodiazepines: NOT DETECTED
Cocaine: NOT DETECTED
OPIATES: NOT DETECTED
Tetrahydrocannabinol: NOT DETECTED

## 2017-07-07 LAB — CK: CK TOTAL: 1466 U/L — AB (ref 38–234)

## 2017-07-07 LAB — PREGNANCY, URINE: PREG TEST UR: NEGATIVE

## 2017-07-07 LAB — LACTIC ACID, PLASMA: LACTIC ACID, VENOUS: 1.4 mmol/L (ref 0.5–1.9)

## 2017-07-07 LAB — I-STAT TROPONIN, ED: TROPONIN I, POC: 0.01 ng/mL (ref 0.00–0.08)

## 2017-07-07 LAB — PROCALCITONIN: Procalcitonin: 0.24 ng/mL

## 2017-07-07 LAB — ACETAMINOPHEN LEVEL: Acetaminophen (Tylenol), Serum: <10 ug/mL — ABNORMAL LOW (ref 10–30)

## 2017-07-07 LAB — TRIGLYCERIDES: Triglycerides: 60 mg/dL (ref <150)

## 2017-07-07 LAB — SALICYLATE LEVEL: Salicylate Lvl: <7 mg/dL (ref 2.8–30.0)

## 2017-07-07 LAB — AMMONIA: AMMONIA: 14 umol/L (ref 9–35)

## 2017-07-07 MED ORDER — PROPOFOL 1000 MG/100ML IV EMUL
INTRAVENOUS | Status: AC
  Filled 2017-07-07: qty 100

## 2017-07-07 MED ORDER — SODIUM CHLORIDE 0.9 % IV BOLUS
1000.0000 mL | Freq: Once | INTRAVENOUS | Status: AC
  Administered 2017-07-07: 1000 mL via INTRAVENOUS

## 2017-07-07 MED ORDER — HEPARIN SODIUM (PORCINE) 5000 UNIT/ML IJ SOLN
5000.0000 [IU] | Freq: Three times a day (TID) | INTRAMUSCULAR | Status: DC
  Administered 2017-07-07 – 2017-07-09 (×6): 5000 [IU] via SUBCUTANEOUS
  Filled 2017-07-07 (×6): qty 1

## 2017-07-07 MED ORDER — SODIUM CHLORIDE 0.9 % IV SOLN: 250.0000 mL | INTRAVENOUS | Status: DC | PRN

## 2017-07-07 MED ORDER — ONDANSETRON HCL 4 MG/2ML IJ SOLN: 4.0000 mg | Freq: Four times a day (QID) | INTRAMUSCULAR | Status: DC | PRN

## 2017-07-07 MED ORDER — PROPOFOL 1000 MG/100ML IV EMUL
5.0000 ug/kg/min | Freq: Once | INTRAVENOUS | Status: AC
  Administered 2017-07-07: 5 ug/kg/min via INTRAVENOUS

## 2017-07-07 MED ORDER — ORAL CARE MOUTH RINSE
15.0000 mL | OROMUCOSAL | Status: DC
  Administered 2017-07-07 – 2017-07-08 (×8): 15 mL via OROMUCOSAL

## 2017-07-07 MED ORDER — FENTANYL CITRATE (PF) 100 MCG/2ML IJ SOLN
100.0000 ug | INTRAMUSCULAR | Status: DC | PRN
  Administered 2017-07-07 (×2): 100 ug via INTRAVENOUS
  Filled 2017-07-07 (×2): qty 2

## 2017-07-07 MED ORDER — ETOMIDATE 2 MG/ML IV SOLN
30.0000 mg | Freq: Once | INTRAVENOUS | Status: AC
  Administered 2017-07-07: 30 mg via INTRAVENOUS

## 2017-07-07 MED ORDER — SODIUM CHLORIDE 0.9 % IV SOLN
INTRAVENOUS | Status: DC
  Administered 2017-07-07 (×2): via INTRAVENOUS

## 2017-07-07 MED ORDER — FENTANYL CITRATE (PF) 100 MCG/2ML IJ SOLN: 100.0000 ug | INTRAMUSCULAR | Status: DC | PRN

## 2017-07-07 MED ORDER — CHLORHEXIDINE GLUCONATE 0.12% ORAL RINSE (MEDLINE KIT)
15.0000 mL | Freq: Two times a day (BID) | OROMUCOSAL | Status: DC
  Administered 2017-07-07 – 2017-07-08 (×2): 15 mL via OROMUCOSAL

## 2017-07-07 MED ORDER — SUCCINYLCHOLINE CHLORIDE 20 MG/ML IJ SOLN
100.0000 mg | Freq: Once | INTRAMUSCULAR | Status: AC
  Administered 2017-07-07: 100 mg via INTRAVENOUS
  Filled 2017-07-07: qty 5

## 2017-07-07 MED ORDER — PROPOFOL 1000 MG/100ML IV EMUL
0.0000 ug/kg/min | INTRAVENOUS | Status: DC
  Administered 2017-07-07: 50 ug/kg/min via INTRAVENOUS
  Administered 2017-07-07: 10 ug/kg/min via INTRAVENOUS
  Administered 2017-07-08: 30 ug/kg/min via INTRAVENOUS
  Administered 2017-07-08: 50 ug/kg/min via INTRAVENOUS
  Filled 2017-07-07 (×5): qty 100

## 2017-07-07 MED ORDER — FAMOTIDINE IN NACL 20-0.9 MG/50ML-% IV SOLN
20.0000 mg | Freq: Two times a day (BID) | INTRAVENOUS | Status: DC
  Administered 2017-07-07 – 2017-07-09 (×4): 20 mg via INTRAVENOUS
  Filled 2017-07-07 (×4): qty 50

## 2017-07-07 NOTE — ED Notes (Signed)
Intubated at present.

## 2017-07-07 NOTE — ED Provider Notes (Signed)
[facility] DEPT Provider Note   CSN: [id number] Arrival date & time: 07/07/17  0934     History   Chief Complaint Chief Complaint  Patient presents with  . Unresponsive    HPI Jenna Hill is a 47 y.o. female.  HPI   Patient is a 47 year old female presenting with altered mental status.  Patient is fairly unresponsive on arrival.  Patient responsive to deep stimuli, or pain.  According to EMS patient was found down by sister.  She sister try to bring her to the shower but was unable to really do so so called EMS.  According to EMS patient has had "overdose on benzos before".  Patient has past medical history significant for schizophrenia, hypertension, bipolar disorder and difficult intubation.  On arrival patient is on numerous sleep breathing with intermittent tracking and intermittent responsiveness to deep stimuli.  No external trauma noted.  Past Medical History:  Diagnosis Date  . Bipolar 1 disorder ([place])   . Difficult intubation   . Hypertension   . PONV (postoperative nausea and vomiting)   . Schizophrenia ([place])   . Spinal headache     Patient Active Problem List   Diagnosis Date Noted  . Multiple thyroid nodules 05/28/2017  . Sinus tachycardia 05/20/2017  . Overdose 05/20/2017  . Chronic diastolic CHF (congestive heart failure) ([place]) 05/20/2017  . 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 AKI (acute kidney injury) ([place]) 04/25/2017  . Shock liver 04/10/2017  . Rhabdomyolysis 04/10/2017  . Bipolar 1 disorder ([place]) 04/10/2017  . Acute respiratory failure ([place])   . Acute encephalopathy 04/05/2017  . Pituitary microadenoma ([place]) 04/25/2015  . OSA (obstructive sleep apnea) 04/16/2015  . Obesity 04/16/2015  . Schizoaffective disorder, bipolar type ([place]) 04/15/2015  . Hypertension 10/05/2012    Past Surgical History:  Procedure Laterality Date  . ANKLE ARTHROSCOPY Right   . CESAREAN SECTION    . HAND SURGERY Right   . ORIF  ANKLE FRACTURE Left 01/29/2016   Procedure: OPEN REDUCTION INTERNAL FIXATION LEFT ANKLE BIMALLEOLAR FRACTURE AND SYNDESMOSIS;  Surgeon: Wylene Simmer, MD;  Location: [facility];  Service: Orthopedics;  Laterality: Left;     OB History   None      Home Medications    Prior to Admission medications   Medication Sig Start Date End Date Taking? Authorizing Provider  amLODipine (NORVASC) 10 MG tablet Take 1 tablet (10 mg total) by mouth daily. For high blood pressure 06/24/17  Yes Scot Jun, FNP  clozapine (CLOZARIL) 200 MG tablet Take 200-400 mg by mouth 2 (two) times daily. Take one tablet in the morning and two tablets at bedtime. 07/04/17  Yes [provider]  gabapentin (NEURONTIN) 300 MG capsule Take 1 capsule (300 mg total) by mouth 4 (four) times daily. For agitation 06/24/17 09/22/17 Yes Scot Jun, FNP  loratadine (CLARITIN) 10 MG tablet Take 1 tablet (10 mg total) by mouth daily. (May buy from over the counter): For allergies 06/01/17  Yes Lindell Spar I, NP  metoprolol succinate (TOPROL-XL) 25 MG 24 hr tablet Take 25 mg by mouth daily.   Yes [provider]  QUEtiapine (SEROQUEL) 400 MG tablet Take 400 mg by mouth at bedtime. 07/04/17  Yes [provider]  zolpidem (AMBIEN) 10 MG tablet Take 10 mg by mouth at bedtime as needed for sleep.  07/04/17  Yes [provider]    Family History Family History  Problem Relation Age of Onset  .  Healthy Mother   . Healthy Father   . Healthy Sister     Social History Social History   Tobacco Use  . Smoking status: Never Smoker  . Smokeless tobacco: Never Used  Substance Use Topics  . Alcohol use: Yes    Comment: Denies  . Drug use: No    Comment: Denies     Allergies   Bee venom; Codeine; Other; Toradol [ketorolac tromethamine]; Lortab [hydrocodone-acetaminophen]; Percocet [oxycodone-acetaminophen]; Ibuprofen; and Latex   Review of Systems Review of Systems  Unable to  perform ROS: Patient unresponsive     Physical Exam Updated Vital Signs BP 132/80   Pulse (!) 132   Temp (!) 97.5 F (36.4 C) (Oral)   Resp 17   SpO2 100%   Physical Exam  Constitutional: She appears well-developed and well-nourished.  HENT:  Head: Normocephalic and atraumatic.  Dry mucous membranes.  Eyes: Right eye exhibits no discharge. Left eye exhibits no discharge.  Cardiovascular:  Tachycardia.  Pulmonary/Chest: Effort normal. She has rales.  Rhonchi and rales right worse than left.  Neurological:  Only responsive to deep stimulation, tracks people for short period of time and then would go back to sleep.  Skin: Skin is warm and dry. She is not diaphoretic.  Psychiatric:  Responsive to deep simulation.  Nursing note and vitals reviewed.    ED Treatments / Results  Labs (all labs ordered are listed, but only abnormal results are displayed) Labs Reviewed  CBC WITH DIFFERENTIAL/PLATELET - Abnormal; Notable for the following components:      Result Value   WBC 22.4 (*)    RBC 3.81 (*)    Hemoglobin 10.9 (*)    HCT 32.1 (*)    Platelets 412 (*)    Neutro Abs 18.0 (*)    Monocytes Absolute 1.4 (*)    All other components within normal limits  I-STAT CG4 LACTIC ACID, ED - Abnormal; Notable for the following components:   Lactic Acid, Venous 2.74 (*)    All other components within normal limits  CBG MONITORING, ED - Abnormal; Notable for the following components:   Glucose-Capillary 143 (*)    All other components within normal limits  COMPREHENSIVE METABOLIC PANEL  RAPID URINE DRUG SCREEN, HOSP PERFORMED  URINALYSIS, ROUTINE W REFLEX MICROSCOPIC  ACETAMINOPHEN LEVEL  ETHANOL  SALICYLATE LEVEL  AMMONIA  BLOOD GAS, ARTERIAL  I-STAT TROPONIN, ED    EKG None  Radiology Ct Head Wo Contrast  Result Date: 07/07/2017 CLINICAL DATA:  Altered level of consciousness EXAM: CT HEAD WITHOUT CONTRAST TECHNIQUE: Contiguous axial images were obtained from the base  of the skull through the vertex without intravenous contrast. COMPARISON:  04/05/2017 FINDINGS: Brain: No evidence of acute infarction, hemorrhage, hydrocephalus, extra-axial collection or mass lesion/mass effect. Vascular: No hyperdense vessel or unexpected calcification. Skull: Normal. Negative for fracture or focal lesion. Sinuses/Orbits: No acute finding. Other: None. IMPRESSION: No acute intracranial abnormality noted. No change from the prior exam. Electronically Signed   By: Inez Catalina M.D.   On: 07/07/2017 10:05   Dg Chest Portable 1 View  Result Date: 07/07/2017 CLINICAL DATA:  Shortness of Breath EXAM: PORTABLE CHEST 1 VIEW COMPARISON:  05/28/2017 FINDINGS: Endotracheal tube is noted 15 mm above the carina. Nasogastric catheter is noted within the stomach. Cardiac shadow is within normal limits. The inspiratory effort is low with some crowding of the central vascular markings. Component of vascular congestion cannot be excluded. No focal confluent infiltrate is seen. No bony abnormality is noted.  IMPRESSION: Tubes and lines as described. Poor inspiratory effort with crowding of the vascular markings. Some mild vascular congestion cannot be excluded. Electronically Signed   By: Inez Catalina M.D.   On: 07/07/2017 10:31    Procedures Procedure Name: Intubation Date/Time: 07/07/2017 10:50 AM Performed by: Macarthur Critchley, MD Preoxygenation: Pre-oxygenation with 100% oxygen Induction Type: Rapid sequence Ventilation: Mask ventilation without difficulty Laryngoscope Size: Mac and 3 Grade View: Grade III Tube size: 7.5 mm Number of attempts: 1 Airway Equipment and Method: Patient positioned with wedge pillow Placement Confirmation: ETT inserted through vocal cords under direct vision,  Positive ETCO2 and Breath sounds checked- equal and bilateral Secured at: 22 cm Tube secured with: ETT holder Dental Injury: Teeth and Oropharynx as per pre-operative assessment  Difficulty Due To:  Difficulty was anticipated Comments: Patient's had small amount of swelling/redundant tissue  directly anterior to the vocal cords.  Uncomplicated intubation.      (including critical care time)  CRITICAL CARE Performed by: Gardiner Sleeper Total critical care time: 75 minutes Critical care time was exclusive of separately billable procedures and treating other patients. Critical care was necessary to treat or prevent imminent or life-threatening deterioration. Critical care was time spent personally by me on the following activities: development of treatment plan with patient and/or surrogate as well as nursing, discussions with consultants, evaluation of patient's response to treatment, examination of patient, obtaining history from patient or surrogate, ordering and performing treatments and interventions, ordering and review of laboratory studies, ordering and review of radiographic studies, pulse oximetry and re-evaluation of patient's condition.   Medications Ordered in ED Medications  sodium chloride 0.9 % bolus 1,000 mL (has no administration in time range)  etomidate (AMIDATE) injection 30 mg (30 mg Intravenous Given 07/07/17 1005)  succinylcholine (ANECTINE) injection 100 mg (100 mg Intravenous Given 07/07/17 1006)  propofol (DIPRIVAN) 1000 MG/100ML infusion (25 mcg/kg/min  99.7 kg Intravenous Rate/Dose Change 07/07/17 1025)     Initial Impression / Assessment and Plan / ED Course  I have reviewed the triage vital signs and the nursing notes.  Pertinent labs & imaging results that were available during my care of the patient were reviewed by me and considered in my medical decision making (see chart for details).      Patient is a 46 year old female presenting with altered mental status.  Patient is fairly unresponsive on arrival.  Patient responsive to deep stimuli, or pain.  According to EMS patient was found down by sister.  She sister try to bring her to the shower but  was unable to really do so so called EMS.  According to EMS patient has had "overdose on benzos before".  Patient has past medical history significant for schizophrenia, hypertension, bipolar disorder and difficult intubation.  On arrival patient is on numerous sleep breathing with intermittent tracking and intermittent responsiveness to deep stimuli.  No external trauma noted.  10:49 AM Patient brought for CT head immediately given sonorous breathing, concern for head bleed.  CT negative.  Decision made to intubate because patient was not adequately protecting airway.  Vital signs remained stable.  Differential includes drug overdose, elevated ammonia, hypercarbia, infectious.  Patient has history of acute encephalopathy in the past.  Will admit for continued work-up.  Final Clinical Impressions(s) / ED Diagnoses   Final diagnoses:  None    ED Discharge Orders    None       Karielle Davidow, Fredia Sorrow, MD 07/07/17 1155

## 2017-07-07 NOTE — H&P (Addendum)
PULMONARY / CRITICAL CARE MEDICINE   Name: Jenna Hill MRN: [id number] DOB: 1970/02/28    ADMISSION DATE:  07/07/2017 CONSULTATION DATE:  07/07/2017  REFERRING MD:  Dr. Zenovia Jarred  CHIEF COMPLAINT:  Unresponsive  HISTORY OF PRESENT ILLNESS:   [name] is a 47 y.o. Female who presents to [facility] ED with report of Unresponsiveness.  Pt is intubated and no family present, therefore history is obtained from the ED notes, staff at bedside, and chart review.  She was found down by her sister (unknown downtime), who called EMS.  In ED, pt was intubated for airway protection due to sonorous breathing.  Initial workup in ED, revealed a negative UDS, and CT head w/o contrast negative for any acute abnormalities. Serum Ammonia is 14, lactic acid 2.74, acetaminophen & salicylate levels negative. CXR is negative for acute infiltrate.  Pt does have a history of schizophrenia and bipolar disorder, for which she follows with Noemi Chapel NP of [facility] [phone]).  Pt's family reports that she saw Psychiatry on 07/04/17 with reported medication adjustment.  Pt does have a history of acute encephalopathy, AKI in the setting of rhabdomyolysis in 04/2017,  requiring intubation, of which that event was noted after outpatient psych medications were adjusted.  PCCM is asked to admit pt for unresponsiveness requiring intubation.  Called Noemi Chapel NP, and confirmed that pt did not receive any injections at her psych office.  PAST MEDICAL HISTORY :  She  has a past medical history of Bipolar 1 disorder ([place]), Difficult intubation, Hypertension, PONV (postoperative nausea and vomiting), Schizophrenia ([place]), and Spinal headache.  PAST SURGICAL HISTORY: She  has a past surgical history that includes Ankle arthroscopy (Right); Cesarean section; Hand surgery (Right); and ORIF ankle fracture (Left, 01/29/2016).  Allergies  Allergen Reactions  . Bee Venom Anaphylaxis  . Codeine  Swelling and Other (See Comments)    Facial swelling  . Other Anaphylaxis and Other (See Comments)    bertrillium or bertillium    . Toradol [Ketorolac Tromethamine] Swelling    Tongue swelling  . Lortab [Hydrocodone-Acetaminophen] Hives  . Percocet [Oxycodone-Acetaminophen] Hives  . Ibuprofen Hives and Other (See Comments)    Raised hives Also with naproxen  . Latex Rash    No current facility-administered medications on file prior to encounter.    Current Outpatient Medications on File Prior to Encounter  Medication Sig  . amLODipine (NORVASC) 10 MG tablet Take 1 tablet (10 mg total) by mouth daily. For high blood pressure  . clozapine (CLOZARIL) 200 MG tablet Take 200-400 mg by mouth 2 (two) times daily. Take one tablet in the morning and two tablets at bedtime.  . gabapentin (NEURONTIN) 300 MG capsule Take 1 capsule (300 mg total) by mouth 4 (four) times daily. For agitation  . loratadine (CLARITIN) 10 MG tablet Take 1 tablet (10 mg total) by mouth daily. (May buy from over the counter): For allergies  . metoprolol succinate (TOPROL-XL) 25 MG 24 hr tablet Take 25 mg by mouth daily.  . QUEtiapine (SEROQUEL) 400 MG tablet Take 400 mg by mouth at bedtime.  Marland Kitchen zolpidem (AMBIEN) 10 MG tablet Take 10 mg by mouth at bedtime as needed for sleep.     FAMILY HISTORY:  Her indicated that her mother is alive. She indicated that her father is alive. She indicated that her sister is alive.   SOCIAL HISTORY: She  reports that she has never smoked. She has never used smokeless tobacco. She reports that  she drinks alcohol. She reports that she does not use drugs.  REVIEW OF SYSTEMS:   Unable to obtain due to unresponsiveness/intubated  SUBJECTIVE:  On PRVC Propofol infusing had been infusing, turned off to assess mental status Hypothermic  VITAL SIGNS: BP 108/73 (BP Location: Left Arm)   Pulse (!) 116   Temp (!) 97.5 F (36.4 C) (Oral)   Resp (!) 24   SpO2 100%   HEMODYNAMICS:     VENTILATOR SETTINGS: Vent Mode: PRVC FiO2 (%):  [50 %-100 %] 50 % Set Rate:  [22 bmp] 22 bmp Vt Set:  [420 mL] 420 mL PEEP:  [5 cmH20] 5 cmH20  INTAKE / OUTPUT: No intake/output data recorded.  PHYSICAL EXAMINATION: General: Acutely ill-appearing obese female, laying in bed, intubated, in no acute distress Neuro:  Unresponsive, propofol gtt turned off, Pupils PERRL, gag relex intact HEENT:  Atraumatic, normocephalic, neck supple, No JVD Cardiovascular:  RRR, s1s2 noted,No M/R/G Lungs:  Clear throughout upon auscultation, even, non-labored, ventilator assisted, no assessory muscle use Abdomen:  Obese, soft, non-tender Musculoskeletal:  No deformities, normal bulk and tone Skin:  Dry.  No obvious rashes, lesions, or ulcerations  LABS:  BMET Recent Labs  Lab 07/07/17 1022  NA 138  K 4.5  CL 103  CO2 23  BUN 30*  CREATININE 1.42*  GLUCOSE 128*    Electrolytes Recent Labs  Lab 07/07/17 1022  CALCIUM 9.1    CBC Recent Labs  Lab 07/07/17 1022  WBC 22.4*  HGB 10.9*  HCT 32.1*  PLT 412*    Coag's No results for input(s): APTT, INR in the last 168 hours.  Sepsis Markers Recent Labs  Lab 07/07/17 1029  LATICACIDVEN 2.74*    ABG Recent Labs  Lab 07/07/17 1108  PHART 7.393  PCO2ART 36.8  PO2ART 258*    Liver Enzymes Recent Labs  Lab 07/07/17 1022  AST 37  ALT 38  ALKPHOS 49  BILITOT 1.0  ALBUMIN 3.9    Cardiac Enzymes No results for input(s): TROPONINI, PROBNP in the last 168 hours.  Glucose Recent Labs  Lab 07/07/17 0945  GLUCAP 143*    Imaging Ct Head Wo Contrast  Result Date: 07/07/2017 CLINICAL DATA:  Altered level of consciousness EXAM: CT HEAD WITHOUT CONTRAST TECHNIQUE: Contiguous axial images were obtained from the base of the skull through the vertex without intravenous contrast. COMPARISON:  04/05/2017 FINDINGS: Brain: No evidence of acute infarction, hemorrhage, hydrocephalus, extra-axial collection or mass lesion/mass  effect. Vascular: No hyperdense vessel or unexpected calcification. Skull: Normal. Negative for fracture or focal lesion. Sinuses/Orbits: No acute finding. Other: None. IMPRESSION: No acute intracranial abnormality noted. No change from the prior exam. Electronically Signed   By: Inez Catalina M.D.   On: 07/07/2017 10:05   Dg Chest Portable 1 View  Result Date: 07/07/2017 CLINICAL DATA:  Shortness of Breath EXAM: PORTABLE CHEST 1 VIEW COMPARISON:  05/28/2017 FINDINGS: Endotracheal tube is noted 15 mm above the carina. Nasogastric catheter is noted within the stomach. Cardiac shadow is within normal limits. The inspiratory effort is low with some crowding of the central vascular markings. Component of vascular congestion cannot be excluded. No focal confluent infiltrate is seen. No bony abnormality is noted. IMPRESSION: Tubes and lines as described. Poor inspiratory effort with crowding of the vascular markings. Some mild vascular congestion cannot be excluded. Electronically Signed   By: Inez Catalina M.D.   On: 07/07/2017 10:31     STUDIES:  CT Head w/o Contrast 6/6>> No acute  intracranial abnormality noted  CULTURES: Blood cultures 6/6>>  ANTIBIOTICS:   SIGNIFICANT EVENTS: 6/6 Admission to [facility] ICU   LINES/TUBES: ETT 6/6>>  DISCUSSION: This is a 47 y.o. Female who presented with unresponsiveness requiring intubation for airway protection.  Urine drug screen negative, CT head negative, CXR without infiltrates, serum ammonia normal.  Rule out infectious etiology.  High suspicion for encephalopathy secondary to adjustment of outpatient psych medications.  ASSESSMENT / PLAN:  PULMONARY A: Intubated for airway protection due to unresponsiveness P:   Full vent support, TV 8 cc/kg SBT when parameters mets VAP bundle  Pulmonary hygiene Follow intermittent CXR ABG in am 6/7  CARDIOVASCULAR A:  No active issues Hx: HTN P:  ICU monitoring Maintain MAP >65 Hold home Norvasc &  metoprolol for now  RENAL A:   Acute Kidney Injury  Rhabdomylosis P:   Follow I&O's / urinary output Follow BMP Replace electrolytes as indicated Ensure adequate renal perfusion Check CK now and in am 6/7 IV NS@125  ml/hr  GASTROINTESTINAL A:   No active issues P:   Keep NPO for now If remains intubated, consider starting TF in am 6/7 Pepcid for SUP  HEMATOLOGIC A:   Anemia P:  Monitor for s/sx of bleeding Follow CBC Heparin SQ for VTE prophylaxis Transfuse for Hgb <7  INFECTIOUS A:   Leukocytosis Hypothermic -CXR with no infiltrates -UA negative -Suspect stress response, rule out infection P:   Monitor fever curve Follow WBC's Follow Blood cultures PCT x1  ENDOCRINE A:   No active issues   P:   Follow glucose with BMP's Follow ICU Hyper/hypoglycemia protocol  NEUROLOGIC A:   Acute encephalopathy, unknown etiology, suspect r/t home psych meds adjustment -UDS negative -CT Head negative Hx: Schizophrenia, bipolar disorder P:   RASS goal: 0 to -1 Avoid sedating medications as able Propofol & Prn fentanyl pushes to maintain RASS goal Provide supportive care Obtain EEG Consider MRI head Hold home Clozapine, gabapentin, quetiapine, zolpidem   FAMILY  - Updates: No family at bedside.  Mother and friend Langley Gauss updated via telephone.  - Inter-disciplinary family meet or Palliative Care meeting due by:  07/14/17    Darel Hong, AGACNP-BC [facility] Pager: [phone]  07/07/2017, 1:09 PM

## 2017-07-07 NOTE — Procedures (Signed)
Date of recording 07/07/2017  Referring physician Dr. Dewaine Conger  Technical Digital EEG recording using 10-20 International electrode system.  Reason for the study Unresponsive episode  Description of the recording When awake posterior dominant rhythm is 8-9Hz  symmetrical reactive, I Non-REM stage II sleep was not obtained. No localizing, lateralizing, interictal epileptiform features were seen during this recording.  Impression The EEG is normal in awake state only

## 2017-07-07 NOTE — Progress Notes (Signed)
Pt responsive and following commands. NP notified of improvement in mental status.

## 2017-07-07 NOTE — ED Notes (Signed)
Portable xray at bedside.

## 2017-07-07 NOTE — Progress Notes (Signed)
Offsite EEG completed at [facility]. Results pending. 

## 2017-07-07 NOTE — ED Notes (Signed)
[id number] transported to CT. Returned from CT at present. Plan to intubate.

## 2017-07-07 NOTE — ED Triage Notes (Signed)
Per EMS sister assisting pt in shower, pt became altered. Pt snoring with breathing. Unknown if drug related.

## 2017-07-08 ENCOUNTER — Inpatient Hospital Stay (HOSPITAL_COMMUNITY): Payer: Medicaid Other

## 2017-07-08 DIAGNOSIS — J9600 Acute respiratory failure, unspecified whether with hypoxia or hypercapnia: Secondary | ICD-10-CM

## 2017-07-08 DIAGNOSIS — G9340 Encephalopathy, unspecified: Secondary | ICD-10-CM

## 2017-07-08 LAB — BLOOD GAS, ARTERIAL
ACID-BASE DEFICIT: 1.3 mmol/L (ref 0.0–2.0)
Bicarbonate: 22.6 mmol/L (ref 20.0–28.0)
DRAWN BY: 422461
FIO2: 35
MECHVT: 420 mL
O2 Saturation: 94.9 %
PEEP/CPAP: 5 cmH2O
PO2 ART: 77.7 mmHg — AB (ref 83.0–108.0)
Patient temperature: 98.7
RATE: 16 resp/min
pCO2 arterial: 36.7 mmHg (ref 32.0–48.0)
pH, Arterial: 7.406 (ref 7.350–7.450)

## 2017-07-08 LAB — BASIC METABOLIC PANEL
ANION GAP: 8 (ref 5–15)
BUN: 21 mg/dL — ABNORMAL HIGH (ref 6–20)
CHLORIDE: 112 mmol/L — AB (ref 101–111)
CO2: 24 mmol/L (ref 22–32)
Calcium: 8.5 mg/dL — ABNORMAL LOW (ref 8.9–10.3)
Creatinine, Ser: 1.09 mg/dL — ABNORMAL HIGH (ref 0.44–1.00)
GFR, EST NON AFRICAN AMERICAN: 60 mL/min — AB (ref >60)
Glucose, Bld: 71 mg/dL (ref 65–99)
POTASSIUM: 3.4 mmol/L — AB (ref 3.5–5.1)
SODIUM: 144 mmol/L (ref 135–145)

## 2017-07-08 LAB — CBC
HCT: 27.6 % — ABNORMAL LOW (ref 36.0–46.0)
HEMOGLOBIN: 9.2 g/dL — AB (ref 12.0–15.0)
MCH: 28.5 pg (ref 26.0–34.0)
MCHC: 33.3 g/dL (ref 30.0–36.0)
MCV: 85.4 fL (ref 78.0–100.0)
PLATELETS: 357 10*3/uL (ref 150–400)
RBC: 3.23 MIL/uL — AB (ref 3.87–5.11)
RDW: 13.7 % (ref 11.5–15.5)
WBC: 12.1 10*3/uL — AB (ref 4.0–10.5)

## 2017-07-08 LAB — GLUCOSE, CAPILLARY
GLUCOSE-CAPILLARY: 79 mg/dL (ref 65–99)
Glucose-Capillary: 73 mg/dL (ref 65–99)

## 2017-07-08 LAB — CK: CK TOTAL: 633 U/L — AB (ref 38–234)

## 2017-07-08 MED ORDER — ACETAMINOPHEN 325 MG PO TABS
650.0000 mg | ORAL_TABLET | ORAL | Status: DC | PRN
  Administered 2017-07-08 – 2017-07-10 (×5): 650 mg via ORAL
  Filled 2017-07-08 (×6): qty 2

## 2017-07-08 MED ORDER — ORAL CARE MOUTH RINSE
15.0000 mL | Freq: Two times a day (BID) | OROMUCOSAL | Status: DC
  Administered 2017-07-08 – 2017-07-10 (×4): 15 mL via OROMUCOSAL

## 2017-07-08 MED ORDER — CLOZAPINE 100 MG PO TABS
400.0000 mg | ORAL_TABLET | Freq: Every day | ORAL | Status: DC
  Administered 2017-07-08 – 2017-07-10 (×3): 400 mg via ORAL
  Filled 2017-07-08 (×3): qty 4

## 2017-07-08 MED ORDER — FUROSEMIDE 10 MG/ML IJ SOLN
20.0000 mg | Freq: Once | INTRAMUSCULAR | Status: AC
  Administered 2017-07-08: 20 mg via INTRAVENOUS
  Filled 2017-07-08: qty 2

## 2017-07-08 MED ORDER — POTASSIUM CHLORIDE 20 MEQ/15ML (10%) PO SOLN
20.0000 meq | ORAL | Status: AC
  Administered 2017-07-08 (×2): 20 meq
  Filled 2017-07-08 (×2): qty 15

## 2017-07-08 MED ORDER — QUETIAPINE FUMARATE ER 200 MG PO TB24
200.0000 mg | ORAL_TABLET | Freq: Every day | ORAL | Status: DC
  Administered 2017-07-08 – 2017-07-10 (×3): 200 mg via ORAL
  Filled 2017-07-08 (×3): qty 1

## 2017-07-08 MED ORDER — METOPROLOL TARTRATE 5 MG/5ML IV SOLN
5.0000 mg | Freq: Four times a day (QID) | INTRAVENOUS | Status: DC | PRN
  Administered 2017-07-08 – 2017-07-11 (×4): 5 mg via INTRAVENOUS
  Filled 2017-07-08 (×4): qty 5

## 2017-07-08 MED ORDER — CLOZAPINE 100 MG PO TABS
200.0000 mg | ORAL_TABLET | Freq: Every day | ORAL | Status: DC
  Administered 2017-07-09 – 2017-07-11 (×3): 200 mg via ORAL
  Filled 2017-07-08 (×4): qty 2

## 2017-07-08 MED ORDER — METOPROLOL SUCCINATE ER 25 MG PO TB24
25.0000 mg | ORAL_TABLET | Freq: Every day | ORAL | Status: DC
  Administered 2017-07-08 – 2017-07-09 (×2): 25 mg via ORAL
  Filled 2017-07-08 (×2): qty 1

## 2017-07-08 MED ORDER — QUETIAPINE FUMARATE ER 200 MG PO TB24: 200.0000 mg | ORAL_TABLET | Freq: Every day | ORAL | Status: DC

## 2017-07-08 MED ORDER — AMLODIPINE BESYLATE 10 MG PO TABS
10.0000 mg | ORAL_TABLET | Freq: Every day | ORAL | Status: DC
  Administered 2017-07-08 – 2017-07-09 (×2): 10 mg via ORAL
  Filled 2017-07-08 (×2): qty 1

## 2017-07-08 NOTE — Progress Notes (Addendum)
47 year old woman with schizophrenia and bipolar disorder admitted 6/6 after being found down unresponsive and was intubated in the ED for airway protection. Urine drug screen was negative and toxicology screen was negative with ammonia level of 14.  Head CT did not show any abnormalities. Of note she is on multiple medications including Seroquel 400, clozapine and Ambien 10 mg and Neurontin. She was maintained on propofol for sedation. On exam this morning-awake, responsive, follows commands weaning on pressure support 5/5, moving all 4 extremities although limited flicker movement of right foot, S1-S2 normal, clear lungs, swollen hands.  Labs shows mild hypokalemia 3.4, leukocytosis is decreased from 22-12. CK has decreased to 630  Chest x-ray personally reviewed shows bilateral airspace disease with mild vascular congestion.  Impression/plan Acute respiratory failure-she been well on pressure support and was extubated to nasal cannula and seems to be tolerating well.  Acute encephalopathy-unclear cause, now resolved, no evidence of infection or stroke, could be related to multiple psychiatric medications although she has been on same doses for a while. - EEG normal  Mild rhabdo-has improved, discontinue IV fluids and give Lasix 20 mg given edema on hands  Diet can be advanced, she will need physical therapy and resumption of psychiatric meds when able. To triad 6/8 and stepdown status for today  My critical care time x 39m  Rakesh V. Elsworth Soho MD

## 2017-07-08 NOTE — Progress Notes (Signed)
[facility] ADULT ICU REPLACEMENT PROTOCOL FOR AM LAB REPLACEMENT ONLY  The patient does apply for the [facility] Adult ICU Electrolyte Replacment Protocol based on the criteria listed below:   1. Is GFR >/= 40 ml/min? Yes.    Patient's GFR today is >60 2. Is urine output >/= 0.5 ml/kg/hr for the last 6 hours? Yes.   Patient's UOP is .8 ml/kg/hr 3. Is BUN < 60 mg/dL? Yes.    Patient's BUN today is 21 4. Abnormal electrolyte(s): K-3.4 5. Ordered repletion with: per protocol 6. If a panic level lab has been reported, has the CCM MD in charge been notified? Yes.  .   Physician:  Dr. Jonetta Speak, Philis Nettle 07/08/2017 5:24 AM

## 2017-07-08 NOTE — Consult Note (Signed)
[facility] Face-to-Face Psychiatry Consult   Reason for Consult:  Medication management Referring Physician:  Dr. Lynetta Mare Patient Identification: Jenna Hill MRN:  [id number] Principal Diagnosis: Schizoaffective disorder, bipolar type [facility]) Diagnosis:   Patient Active Problem List   Diagnosis Date Noted  . Unresponsive [R41.89] 07/07/2017  . Multiple thyroid nodules [E04.2] 05/28/2017  . Sinus tachycardia [R00.0] 05/20/2017  . Overdose [T50.901A] 05/20/2017  . Chronic diastolic CHF (congestive heart failure) ([place]) [I50.32] 05/20/2017  . Injury of right sciatic nerve [S74.01XA] 04/29/2017  . AKI (acute kidney injury) ([place]) [N17.9] 04/25/2017  . Shock liver [K72.00] 04/10/2017  . Rhabdomyolysis [M62.82] 04/10/2017  . Bipolar 1 disorder ([place]) [F31.9] 04/10/2017  . Acute respiratory failure ([place]) [J96.00]   . Acute encephalopathy [G93.40] 04/05/2017  . Pituitary microadenoma ([place]) [D35.2] 04/25/2015  . OSA (obstructive sleep apnea) [G47.33] 04/16/2015  . Obesity [E66.9] 04/16/2015  . Schizoaffective disorder, bipolar type ([place]) [F25.0] 04/15/2015  . Hypertension [I10] 10/05/2012    Total Time spent with patient: 1 hour  Subjective:   Jenna Hill is a 47 y.o. female patient admitted with unresponsiveness.   HPI:   Per chart review, patient was admitted with unresponsiveness and requiring intubation for airway protection. CK was 633 on admission. UDS and BAL were negative on admission as well as salicylate and APAP levels.  Of note, patient was admitted to [facility] in 05/2017 for accidental prescription drug overdose in the setting of recent medication changes with her outpatient provider. She was reportedly transitioning from Abilify to Clozaril. Her outpatient provider also discontinued several medications include Lunesta, Seroquel, Klonopin and Doxepin due to concern for oversedation. Discharge medications included Gabapentin 300 mg qhs, Haldol 5 mg q am and 10 mg qhs and Trazodone 100 mg  qhs. Clozaril was discontinued. Current home medications include Clozaril 200 mg q am and 400 mg qhs, Seroquel 400 mg qhs, Ambien 10 mg qhs PRN and Gabapentin 300 mg QID. PMP indicates that she regularly filled prescriptions for Lunesta (last refill was on 4/17) but recently filled a prescription for Ambien (filled on 6/3).   On interview, Jenna Hill reports that her mood has been "fine."  She denies SI, HI or VH.  She does report chronic AH that are not command in nature or distressing to her.  She reports no problems with appetite.  She reports chronic poor sleep.  She sleeps 4 hours nightly.  She believes that her problems with sleep are related to paranoia due to living alone.  She reports that watching television is helpful for this.  She reports compliance with her medications.  She is wary of medication changes today and admits to using alcohol prior to admission which she believes is what caused her current presentation.  She reports drinking 3 jello shooters although she does not appear to be sure of the quantity of alcohol she had.   Past Psychiatric History: Schizoaffective disorder   Risk to Self:  None. Denies SI. Risk to Others:  None. Denies HI.  Prior Inpatient Therapy:  She has a history of multiple psychiatric hospitalizations. She was last hospitalized in 05/2017 for accidental drug overdose.  Prior Outpatient Therapy:  She is followed by Jenna Chapel, NP.    Past Medical History:  Past Medical History:  Diagnosis Date  . Bipolar 1 disorder ([place])   . Difficult intubation   . Hypertension   . PONV (postoperative nausea and vomiting)   . Schizophrenia ([place])   . Spinal headache     Past Surgical History:  Procedure Laterality Date  . ANKLE ARTHROSCOPY Right   . CESAREAN SECTION    . HAND SURGERY Right   . ORIF ANKLE FRACTURE Left 01/29/2016   Procedure: OPEN REDUCTION INTERNAL FIXATION LEFT ANKLE BIMALLEOLAR FRACTURE AND SYNDESMOSIS;  Surgeon: Wylene Simmer, MD;  Location:  [facility];  Service: Orthopedics;  Laterality: Left;   Family History:  Family History  Problem Relation Age of Onset  . Healthy Mother   . Healthy Father   . Healthy Sister    Family Psychiatric  History: Denies  Social History:  Social History   Substance and Sexual Activity  Alcohol Use Yes   Comment: Denies     Social History   Substance and Sexual Activity  Drug Use No   Comment: Denies    Social History   Socioeconomic History  . Marital status: Married    Spouse name: Not on file  . Number of children: Not on file  . Years of education: Not on file  . Highest education level: Not on file  Occupational History  . Not on file  Social Needs  . Financial resource strain: Not on file  . Food insecurity:    Worry: Not on file    Inability: Not on file  . Transportation needs:    Medical: Not on file    Non-medical: Not on file  Tobacco Use  . Smoking status: Never Smoker  . Smokeless tobacco: Never Used  Substance and Sexual Activity  . Alcohol use: Yes    Comment: Denies  . Drug use: No    Comment: Denies  . Sexual activity: Not Currently    Birth control/protection: None  Lifestyle  . Physical activity:    Days per week: Not on file    Minutes per session: Not on file  . Stress: Not on file  Relationships  . Social connections:    Talks on phone: Not on file    Gets together: Not on file    Attends religious service: Not on file    Active member of club or organization: Not on file    Attends meetings of clubs or organizations: Not on file    Relationship status: Not on file  Other Topics Concern  . Not on file  Social History Narrative  . Not on file   Additional Social History: She lives alone. She is divorced. She does not have children. She received disability. She reports occasional alcohol use. She denies illicit substance use.     Allergies:   Allergies  Allergen Reactions  . Bee Venom Anaphylaxis  . Codeine  Swelling and Other (See Comments)    Facial swelling  . Other Anaphylaxis and Other (See Comments)    bertrillium or bertillium    . Toradol [Ketorolac Tromethamine] Swelling    Tongue swelling  . Lortab [Hydrocodone-Acetaminophen] Hives  . Percocet [Oxycodone-Acetaminophen] Hives  . Ibuprofen Hives and Other (See Comments)    Raised hives Also with naproxen  . Latex Rash    Labs:  Results for orders placed or performed during the hospital encounter of 07/07/17 (from the past 48 hour(s))  CBG monitoring, ED     Status: Abnormal   Collection Time: 07/07/17  9:45 AM  Result Value Ref Range   Glucose-Capillary 143 (H) 65 - 99 mg/dL   Comment 1 Notify RN    Comment 2 Document in Chart   Comprehensive metabolic panel     Status: Abnormal   Collection Time: 07/07/17 10:22  AM  Result Value Ref Range   Sodium 138 135 - 145 mmol/L   Potassium 4.5 3.5 - 5.1 mmol/L   Chloride 103 101 - 111 mmol/L   CO2 23 22 - 32 mmol/L   Glucose, Bld 128 (H) 65 - 99 mg/dL   BUN 30 (H) 6 - 20 mg/dL   Creatinine, Ser 1.42 (H) 0.44 - 1.00 mg/dL   Calcium 9.1 8.9 - 10.3 mg/dL   Total Protein 7.9 6.5 - 8.1 g/dL   Albumin 3.9 3.5 - 5.0 g/dL   AST 37 15 - 41 U/L   ALT 38 14 - 54 U/L   Alkaline Phosphatase 49 38 - 126 U/L   Total Bilirubin 1.0 0.3 - 1.2 mg/dL   GFR calc non Af Amer 44 (L) >60 mL/min   GFR calc Af Amer 50 (L) >60 mL/min    Comment: (NOTE) The eGFR has been calculated using the CKD EPI equation. This calculation has not been validated in all clinical situations. eGFR's persistently <60 mL/min signify possible Chronic Kidney Disease.    Anion gap 12 5 - 15    Comment: Performed at [facility], [place] [address] [place], [place] [address]  CBC with Differential/Platelet     Status: Abnormal   Collection Time: 07/07/17 10:22 AM  Result Value Ref Range   WBC 22.4 (H) 4.0 - 10.5 K/uL   RBC 3.81 (L) 3.87 - 5.11 MIL/uL   Hemoglobin 10.9 (L) 12.0 - 15.0 g/dL   HCT 32.1  (L) 36.0 - 46.0 %   MCV 84.3 78.0 - 100.0 fL   MCH 28.6 26.0 - 34.0 pg   MCHC 34.0 30.0 - 36.0 g/dL   RDW 13.7 11.5 - 15.5 %   Platelets 412 (H) 150 - 400 K/uL   Neutrophils Relative % 81 %   Neutro Abs 18.0 (H) 1.7 - 7.7 K/uL   Lymphocytes Relative 13 %   Lymphs Abs 3.0 0.7 - 4.0 K/uL   Monocytes Relative 6 %   Monocytes Absolute 1.4 (H) 0.1 - 1.0 K/uL   Eosinophils Relative 0 %   Eosinophils Absolute 0.0 0.0 - 0.7 K/uL   Basophils Relative 0 %   Basophils Absolute 0.0 0.0 - 0.1 K/uL    Comment: Performed at [facility], [place] [address] [place], [place] [address]  Rapid urine drug screen (hospital performed)     Status: None   Collection Time: 07/07/17 10:22 AM  Result Value Ref Range   Opiates NONE DETECTED NONE DETECTED   Cocaine NONE DETECTED NONE DETECTED   Benzodiazepines NONE DETECTED NONE DETECTED   Amphetamines NONE DETECTED NONE DETECTED   Tetrahydrocannabinol NONE DETECTED NONE DETECTED   Barbiturates NONE DETECTED NONE DETECTED    Comment: (NOTE) DRUG SCREEN FOR MEDICAL PURPOSES ONLY.  IF CONFIRMATION IS NEEDED FOR ANY PURPOSE, NOTIFY LAB WITHIN 5 DAYS. LOWEST DETECTABLE LIMITS FOR URINE DRUG SCREEN Drug Class                     Cutoff (ng/mL) Amphetamine and metabolites    1000 Barbiturate and metabolites    200 Benzodiazepine                 185 Tricyclics and metabolites     300 Opiates and metabolites        300 Cocaine and metabolites        300 THC  50 Performed at [facility], [place] [address] [place], [place] [address]   CK     Status: Abnormal   Collection Time: 07/07/17 10:22 AM  Result Value Ref Range   Total CK 1,466 (H) 38 - 234 U/L    Comment: Performed at [facility], [place] [address], [place], [place] [address]  Urinalysis, Routine w reflex microscopic     Status: Abnormal   Collection Time: 07/07/17 10:23 AM  Result Value Ref Range   Color, Urine YELLOW  YELLOW   APPearance HAZY (A) CLEAR   Specific Gravity, Urine 1.019 1.005 - 1.030   pH 5.0 5.0 - 8.0   Glucose, UA NEGATIVE NEGATIVE mg/dL   Hgb urine dipstick NEGATIVE NEGATIVE   Bilirubin Urine NEGATIVE NEGATIVE   Ketones, ur NEGATIVE NEGATIVE mg/dL   Protein, ur 30 (A) NEGATIVE mg/dL   Nitrite NEGATIVE NEGATIVE   Leukocytes, UA NEGATIVE NEGATIVE   RBC / HPF 0-5 0 - 5 RBC/hpf   WBC, UA 6-10 0 - 5 WBC/hpf   Bacteria, UA FEW (A) NONE SEEN   Squamous Epithelial / LPF 0-5 0 - 5   Mucus PRESENT    Hyaline Casts, UA PRESENT     Comment: Performed at [facility], [place] [address], [place], [place] [address]  Acetaminophen level     Status: Abnormal   Collection Time: 07/07/17 10:23 AM  Result Value Ref Range   Acetaminophen (Tylenol), Serum <10 (L) 10 - 30 ug/mL    Comment: (NOTE) Therapeutic concentrations vary significantly. A range of 10-30 ug/mL  may be an effective concentration for many patients. However, some  are best treated at concentrations outside of this range. Acetaminophen concentrations >150 ug/mL at 4 hours after ingestion  and >50 ug/mL at 12 hours after ingestion are often associated with  toxic reactions. Performed at [facility], [place] [address], [place], [place] [address]   Ethanol     Status: None   Collection Time: 07/07/17 10:23 AM  Result Value Ref Range   Alcohol, Ethyl (B) <10 <10 mg/dL    Comment: (NOTE) Lowest detectable limit for serum alcohol is 10 mg/dL. For medical purposes only. Performed at [facility], [place] [address], [place], [place] [address]   Salicylate level     Status: None   Collection Time: 07/07/17 10:23 AM  Result Value Ref Range   Salicylate Lvl <1.0 2.8 - 30.0 mg/dL    Comment: Performed at [facility], [place] [address], [place], [place] [address]  Ammonia     Status: None   Collection Time: 07/07/17 10:23 AM  Result Value Ref Range   Ammonia 14 9 -  35 umol/L    Comment: Performed at [facility], [place] [address] [place], [place] [address]  Pregnancy, urine     Status: None   Collection Time: 07/07/17 10:23 AM  Result Value Ref Range   Preg Test, Ur NEGATIVE NEGATIVE    Comment:        THE SENSITIVITY OF THIS METHODOLOGY IS >20 mIU/mL. Performed at [facility], [place] [address], [place], [place] [address]   I-stat troponin, ED     Status: None   Collection Time: 07/07/17 10:27 AM  Result Value Ref Range   Troponin i, poc 0.01 0.00 - 0.08 ng/mL   Comment 3            Comment: Due to the release kinetics  of cTnI, a negative result within the first hours of the onset of symptoms does not rule out myocardial infarction with certainty. If myocardial infarction is still suspected, repeat the test at appropriate intervals.   I-Stat CG4 Lactic Acid, ED     Status: Abnormal   Collection Time: 07/07/17 10:29 AM  Result Value Ref Range   Lactic Acid, Venous 2.74 (HH) 0.5 - 1.9 mmol/L   Comment NOTIFIED PHYSICIAN   Blood gas, arterial ([facility] & [facility] ONLY)     Status: Abnormal   Collection Time: 07/07/17 11:08 AM  Result Value Ref Range   FIO2 100.00    Delivery systems VENTILATOR    Mode PRESSURE REGULATED VOLUME CONTROL    VT 420 mL   LHR 22 resp/min   Peep/cpap 5.0 cm H20   pH, Arterial 7.393 7.350 - 7.450   pCO2 arterial 36.8 32.0 - 48.0 mmHg   pO2, Arterial 258 (H) 83.0 - 108.0 mmHg   Bicarbonate 22.0 20.0 - 28.0 mmol/L   Acid-base deficit 2.0 0.0 - 2.0 mmol/L   O2 Saturation 99.1 %   Patient temperature 98.6    Collection site RIGHT RADIAL    Drawn by [id number]    Sample type ARTERIAL DRAW     Comment: Performed at [facility], [place] [address], [place], [place] [address]  MRSA PCR Screening     Status: None   Collection Time: 07/07/17  3:02 PM  Result Value Ref Range   MRSA by PCR NEGATIVE NEGATIVE    Comment:        The GeneXpert MRSA Assay (FDA approved for NASAL  specimens only), is one component of a comprehensive MRSA colonization surveillance program. It is not intended to diagnose MRSA infection nor to guide or monitor treatment for MRSA infections. Performed at [facility], [place] [address] [place], [place] [address]   Culture, blood (routine x 2)     Status: None (Preliminary result)   Collection Time: 07/07/17  3:09 PM  Result Value Ref Range   Specimen Description      BLOOD RIGHT HAND Performed at [place] [address], [place], [place] [address]    Special Requests      BOTTLES DRAWN AEROBIC ONLY Blood Culture adequate volume   Culture PENDING    Report Status PENDING   Lactic acid, plasma     Status: None   Collection Time: 07/07/17  3:15 PM  Result Value Ref Range   Lactic Acid, Venous 1.4 0.5 - 1.9 mmol/L    Comment: Performed at [facility], [place] [address] [place], [place] [address]  Procalcitonin - Baseline     Status: None   Collection Time: 07/07/17  3:15 PM  Result Value Ref Range   Procalcitonin 0.24 ng/mL    Comment:        Interpretation: PCT (Procalcitonin) <= 0.5 ng/mL: Systemic infection (sepsis) is not likely. Local bacterial infection is possible. (NOTE)       Sepsis PCT Algorithm           Lower Respiratory Tract                                      Infection PCT Algorithm    ----------------------------     ----------------------------         PCT < 0.25 ng/mL  PCT < 0.10 ng/mL         Strongly encourage             Strongly discourage   discontinuation of antibiotics    initiation of antibiotics    ----------------------------     -----------------------------       PCT 0.25 - 0.50 ng/mL            PCT 0.10 - 0.25 ng/mL               OR       >80% decrease in PCT            Discourage initiation of                                            antibiotics      Encourage discontinuation           of antibiotics     ----------------------------     -----------------------------         PCT >= 0.50 ng/mL              PCT 0.26 - 0.50 ng/mL               AND        <80% decrease in PCT             Encourage initiation of                                             antibiotics       Encourage continuation           of antibiotics    ----------------------------     -----------------------------        PCT >= 0.50 ng/mL                  PCT > 0.50 ng/mL               AND         increase in PCT                  Strongly encourage                                      initiation of antibiotics    Strongly encourage escalation           of antibiotics                                     -----------------------------                                           PCT <= 0.25 ng/mL                                                 OR                                        >  80% decrease in PCT                                     Discontinue / Do not initiate                                             antibiotics Performed at [place] [address] [place], [place] [address]   Triglycerides     Status: None   Collection Time: 07/07/17  3:15 PM  Result Value Ref Range   Triglycerides 60 <150 mg/dL    Comment: Performed at [facility], [place] [address] [place], [place] [address]  CBC     Status: Abnormal   Collection Time: 07/08/17  2:58 AM  Result Value Ref Range   WBC 12.1 (H) 4.0 - 10.5 K/uL   RBC 3.23 (L) 3.87 - 5.11 MIL/uL   Hemoglobin 9.2 (L) 12.0 - 15.0 g/dL   HCT 27.6 (L) 36.0 - 46.0 %   MCV 85.4 78.0 - 100.0 fL   MCH 28.5 26.0 - 34.0 pg   MCHC 33.3 30.0 - 36.0 g/dL   RDW 13.7 11.5 - 15.5 %   Platelets 357 150 - 400 K/uL    Comment: Performed at [facility], [place] [address] [place], [place] [address]  Basic metabolic panel     Status: Abnormal   Collection Time: 07/08/17  2:58 AM  Result Value Ref Range   Sodium 144 135 - 145  mmol/L   Potassium 3.4 (L) 3.5 - 5.1 mmol/L    Comment: DELTA CHECK NOTED   Chloride 112 (H) 101 - 111 mmol/L   CO2 24 22 - 32 mmol/L   Glucose, Bld 71 65 - 99 mg/dL   BUN 21 (H) 6 - 20 mg/dL   Creatinine, Ser 1.09 (H) 0.44 - 1.00 mg/dL   Calcium 8.5 (L) 8.9 - 10.3 mg/dL   GFR calc non Af Amer 60 (L) >60 mL/min   GFR calc Af Amer >60 >60 mL/min    Comment: (NOTE) The eGFR has been calculated using the CKD EPI equation. This calculation has not been validated in all clinical situations. eGFR's persistently <60 mL/min signify possible Chronic Kidney Disease.    Anion gap 8 5 - 15    Comment: Performed at [facility], [place] [address], [place], [place] [address]  CK     Status: Abnormal   Collection Time: 07/08/17  2:58 AM  Result Value Ref Range   Total CK 633 (H) 38 - 234 U/L    Comment: Performed at [facility], [place] [address], [place], [place] [address]  Blood gas, arterial     Status: Abnormal   Collection Time: 07/08/17  4:18 AM  Result Value Ref Range   FIO2 35.00    Delivery systems VENTILATOR    Mode PRESSURE REGULATED VOLUME CONTROL    VT 420 mL   LHR 16 resp/min   Peep/cpap 5.0 cm H20   pH, Arterial 7.406 7.350 - 7.450   pCO2 arterial 36.7 32.0 - 48.0 mmHg   pO2, Arterial 77.7 (L) 83.0 - 108.0 mmHg   Bicarbonate 22.6 20.0 - 28.0 mmol/L   Acid-base deficit 1.3 0.0 - 2.0 mmol/L   O2 Saturation 94.9 %  Patient temperature 98.7    Collection site RIGHT RADIAL    Drawn by [id number]    Sample type ARTERIAL DRAW    Allens test (pass/fail) PASS PASS    Comment: Performed at [facility], [place] [address] [place], [place] [address]  Glucose, capillary     Status: None   Collection Time: 07/08/17  7:43 AM  Result Value Ref Range   Glucose-Capillary 73 65 - 99 mg/dL  Glucose, capillary     Status: None   Collection Time: 07/08/17 11:05 AM  Result Value Ref Range   Glucose-Capillary 79 65 - 99 mg/dL    Current  Facility-Administered Medications  Medication Dose Route Frequency Provider Last Rate Last Dose  . 0.9 %  sodium chloride infusion  250 mL Intravenous PRN Jenna Hill D, NP      . acetaminophen (TYLENOL) tablet 650 mg  650 mg Oral Q4H PRN Jenna Noel, MD      . amLODipine (NORVASC) tablet 10 mg  10 mg Oral Daily Jenna Hill D, NP      . famotidine (PEPCID) IVPB 20 mg premix  20 mg Intravenous Q12H Jenna Bienenstock, NP   Stopped at 07/08/17 0948  . heparin injection 5,000 Units  5,000 Units Subcutaneous Q8H Jenna Hill D, NP   5,000 Units at 07/08/17 7001  . MEDLINE mouth rinse  15 mL Mouth Rinse BID Jenna Hill V, MD      . metoprolol succinate (TOPROL-XL) 24 hr tablet 25 mg  25 mg Oral Daily Jenna Hill D, NP      . metoprolol tartrate (LOPRESSOR) injection 5 mg  5 mg Intravenous Q6H PRN Jenna Noel, MD   5 mg at 07/08/17 1007  . ondansetron (ZOFRAN) injection 4 mg  4 mg Intravenous Q6H PRN Jenna Bienenstock, NP        Musculoskeletal: Strength & Muscle Tone: within normal limits Gait & Station: UTA since patient was lying in bed. Patient leans: N/A  Psychiatric Specialty Exam: Physical Exam  Nursing note and vitals reviewed. Constitutional: She is oriented to person, place, and time. She appears well-developed and well-nourished.  HENT:  Head: Normocephalic and atraumatic.  Neck: Normal range of motion.  Respiratory: Effort normal.  Musculoskeletal: Normal range of motion.  Neurological: She is alert and oriented to person, place, and time.  Psychiatric: She has a normal mood and affect. Her speech is normal and behavior is normal. Judgment and thought content normal. Cognition and memory are normal.    Review of Systems  Constitutional: Negative for chills and fever.  Cardiovascular: Negative for chest pain.  Gastrointestinal: Negative for abdominal pain, constipation, diarrhea, nausea and vomiting.  Psychiatric/Behavioral: Positive for hallucinations  (chronic AH). Negative for depression, substance abuse and suicidal ideas. The patient does not have insomnia.   All other systems reviewed and are negative.   Blood pressure 139/87, pulse (!) 116, temperature 99.3 F (37.4 C), temperature source Core, resp. rate (!) 26, height '5\' 3"'$  (1.6 m), weight 101.6 kg (223 lb 15.8 oz), SpO2 100 %.Body mass index is 39.68 kg/m.  General Appearance: Fairly Groomed, middle aged, African American female, wearing a hospital gown with short braided hair and lying in bed. NAD.   Eye Contact:  Good  Speech:  Clear and Coherent and Normal Rate  Volume:  Normal  Mood:  Euthymic  Affect:  Constricted  Thought Process:  Goal Directed, Linear and Descriptions of Associations: Intact  Orientation:  Full (Time, Place,  and Person)  Thought Content:  Logical and Hallucinations: Auditory that are chronic and baseline for patient.   Suicidal Thoughts:  No  Homicidal Thoughts:  No  Memory:  Immediate;   Fair Recent;   Fair Remote;   Fair  Judgement:  Fair  Insight:  Fair  Psychomotor Activity:  Normal  Concentration:  Concentration: Fair and Attention Span: Fair  Recall:  [employer] of Knowledge:  Fair  Language:  Good  Akathisia:  No  Handed:  Right  AIMS (if indicated):   N/A  Assets:  Communication Skills Desire for Improvement Housing Social Support  ADL's:  Intact  Cognition:  WNL  Sleep:   Okay   Assessment:  Omnia Dollinger is a 47 y.o. female who was admitted with unresponsiveness and requiring intubation. She is taking multiple psychotropic medications although she reports that she has been stable on these medications. It will be important for her providers to coordinate her care in order to avoid polypharmacy. She admits to alcohol use prior to hospitalization which may have contributed to current presentation especially with Ambien use. Recommend restarting her psychotropic medications at lower dose and having patient follow up with her outpatient  provider for further medication management. She endorses chronic AH that are not command in nature and at her baseline. She denies SI or HI and does not appear actively psychotic. She does not warrant inpatient psychiatric hospitalization at this time.   Treatment Plan Summary: -Restart Clozaril 200 mg q am and 400 mg qhs for psychosis. -Reduce Seroquel 400 mg qhs to 200 mg qhs for psychosis. Can be increased further as needed by outpatient provider.  -Discontinue Ambien since can increase risk of medication side effects especially in setting of alcohol use.  -Continue to hold Gabapentin. Can be restarted by outpatient provider if warranted.  -Psychiatry will sign off on patient at this time. Please consult psychiatry again as needed.   Disposition: No evidence of imminent risk to self or others at present.   Patient does not meet criteria for psychiatric inpatient admission.  Faythe Dingwall, DO 07/08/2017 1:36 PM

## 2017-07-08 NOTE — Procedures (Signed)
Extubation Procedure Note  Patient Details:   Name: Jenna Hill DOB: 09-27-1970 MRN: [id number]   Airway Documentation:    Vent end date: 07/08/17 Vent end time: 0955   Evaluation  O2 sats: stable throughout Complications: No apparent complications Patient did tolerate procedure well. Bilateral Breath Sounds: Rhonchi   Yes  Gonzella Lex 07/08/2017, 10:00 AM

## 2017-07-08 NOTE — Progress Notes (Signed)
PULMONARY / CRITICAL CARE MEDICINE   Name: Jenna Hill MRN: [id number] DOB: 01-31-1971    ADMISSION DATE:  07/07/2017 CONSULTATION DATE:  07/07/2017  REFERRING MD:  Dr. Zenovia Jarred  CHIEF COMPLAINT:  Unresponsive  HISTORY OF PRESENT ILLNESS:   Jenna Hill is a 47 y.o. Female who presents to [facility] ED with report of Unresponsiveness.  Pt is intubated and no family present, therefore history is obtained from the ED notes, staff at bedside, and chart review.  She was found down by her sister (unknown downtime), who called EMS.  In ED, pt was intubated for airway protection due to sonorous breathing.  Initial workup in ED, revealed a negative UDS, and CT head w/o contrast negative for any acute abnormalities. Serum Ammonia is 14, lactic acid 2.74, acetaminophen & salicylate levels negative. CXR is negative for acute infiltrate.  Pt does have a history of schizophrenia and bipolar disorder, for which she follows with Noemi Chapel NP of [place] [id number]).  Pt's family reports that she saw Psychiatry on 07/04/17 with reported medication adjustment.  Pt does have a history of acute encephalopathy, AKI in the setting of rhabdomyolysis in 04/2017,  requiring intubation, of which that event was noted after outpatient psych medications were adjusted.  PCCM is asked to admit pt for unresponsiveness requiring intubation.  Called Noemi Chapel NP, and confirmed that pt did not receive any injections at her psych office.  PAST MEDICAL HISTORY :  She  has a past medical history of Bipolar 1 disorder ([place]), Difficult intubation, Hypertension, PONV (postoperative nausea and vomiting), Schizophrenia ([place]), and Spinal headache.  PAST SURGICAL HISTORY: She  has a past surgical history that includes Ankle arthroscopy (Right); Cesarean section; Hand surgery (Right); and ORIF ankle fracture (Left, 01/29/2016).    SUBJECTIVE:  On PSV Propofol off, pt is awake, follows  commands No events overnight per nursing  VITAL SIGNS: BP (!) 162/94   Pulse (!) 125   Temp 99 F (37.2 C) (Core)   Resp (!) 0   Ht 5\' 3"  (1.6 m)   Wt 223 lb 15.8 oz (101.6 kg)   SpO2 100%   BMI 39.68 kg/m   HEMODYNAMICS:    VENTILATOR SETTINGS: Vent Mode: PSV;CPAP FiO2 (%):  [35 %-100 %] 35 % Set Rate:  [16 bmp-22 bmp] 16 bmp Vt Set:  [420 mL] 420 mL PEEP:  [5 cmH20] 5 cmH20 Pressure Support:  [5 cmH20] 5 cmH20 Plateau Pressure:  [10 cmH20-16 cmH20] 14 cmH20  INTAKE / OUTPUT: I/O last 3 completed shifts: In: 1718 [I.V.:1668; IV Piggyback:50] Out: 910 [Urine:910]  PHYSICAL EXAMINATION: General: Acutely ill-appearing obese female, laying in bed, on PSV, in no acute distress Neuro:  Awake and alert, follows commands, Pupils PERRL HEENT:  Atraumatic, normocephalic, neck supple, No JVD Cardiovascular:  Tachycardia, regular rate & rhythm, s1s2 noted, No M/R/G Lungs:  Clear throughout upon auscultation, even, non-labored, PSV, no assessory muscle  Abdomen:  Obese, soft, non-tender, BS+ x4  Musculoskeletal:  No edema Skin: Dry, warm.  No obvious rashes, lesions, or ulcerations  LABS:  BMET Recent Labs  Lab 07/07/17 1022 07/08/17 0258  NA 138 144  K 4.5 3.4*  CL 103 112*  CO2 23 24  BUN 30* 21*  CREATININE 1.42* 1.09*  GLUCOSE 128* 71    Electrolytes Recent Labs  Lab 07/07/17 1022 07/08/17 0258  CALCIUM 9.1 8.5*    CBC Recent Labs  Lab 07/07/17 1022 07/08/17 0258  WBC 22.4* 12.1*  HGB 10.9*  9.2*  HCT 32.1* 27.6*  PLT 412* 357    Coag's No results for input(s): APTT, INR in the last 168 hours.  Sepsis Markers Recent Labs  Lab 07/07/17 1029 07/07/17 1515  LATICACIDVEN 2.74* 1.4  PROCALCITON  --  0.24    ABG Recent Labs  Lab 07/07/17 1108 07/08/17 0418  PHART 7.393 7.406  PCO2ART 36.8 36.7  PO2ART 258* 77.7*    Liver Enzymes Recent Labs  Lab 07/07/17 1022  AST 37  ALT 38  ALKPHOS 49  BILITOT 1.0  ALBUMIN 3.9    Cardiac  Enzymes No results for input(s): TROPONINI, PROBNP in the last 168 hours.  Glucose Recent Labs  Lab 07/07/17 0945 07/08/17 0743  GLUCAP 143* 73    Imaging Dg Abd 1 View  Result Date: 07/07/2017 CLINICAL DATA:  Check gastric catheter placement EXAM: ABDOMEN - 1 VIEW COMPARISON:  04/13/2017 FINDINGS: Scattered large and small bowel gas is noted. Gastric catheter is noted coiled within the stomach. Radiopaque density is noted over the pelvis in the midline. This appears to be related to an extrinsic piercing from prior CT examination. IMPRESSION: Gastric catheter within the stomach. Electronically Signed   By: Inez Catalina M.D.   On: 07/07/2017 15:24   Ct Head Wo Contrast  Result Date: 07/07/2017 CLINICAL DATA:  Altered level of consciousness EXAM: CT HEAD WITHOUT CONTRAST TECHNIQUE: Contiguous axial images were obtained from the base of the skull through the vertex without intravenous contrast. COMPARISON:  04/05/2017 FINDINGS: Brain: No evidence of acute infarction, hemorrhage, hydrocephalus, extra-axial collection or mass lesion/mass effect. Vascular: No hyperdense vessel or unexpected calcification. Skull: Normal. Negative for fracture or focal lesion. Sinuses/Orbits: No acute finding. Other: None. IMPRESSION: No acute intracranial abnormality noted. No change from the prior exam. Electronically Signed   By: Inez Catalina M.D.   On: 07/07/2017 10:05   Dg Chest Port 1 View  Result Date: 07/08/2017 CLINICAL DATA:  Acute onset of respiratory failure. EXAM: PORTABLE CHEST 1 VIEW COMPARISON:  Chest radiograph performed 07/07/2017 FINDINGS: The patient's endotracheal tube is seen ending 1 cm above the carina. This could be retracted 2 cm. An enteric tube is noted extending below the diaphragm. Vascular congestion is noted. Bibasilar airspace opacities may reflect pulmonary edema or pneumonia. A small left pleural effusion is noted. No pneumothorax is seen. The cardiomediastinal silhouette is borderline  normal in size. No acute osseous abnormalities are identified. Degenerative change is noted at the inferior right glenoid. IMPRESSION: 1. Endotracheal tube seen ending 1 cm above the carina. This could be retracted 2 cm. 2. Vascular congestion noted. Bibasilar airspace opacities may reflect pulmonary edema or pneumonia. Small left pleural effusion noted. Electronically Signed   By: Garald Balding M.D.   On: 07/08/2017 04:23   Dg Chest Portable 1 View  Result Date: 07/07/2017 CLINICAL DATA:  Shortness of Breath EXAM: PORTABLE CHEST 1 VIEW COMPARISON:  05/28/2017 FINDINGS: Endotracheal tube is noted 15 mm above the carina. Nasogastric catheter is noted within the stomach. Cardiac shadow is within normal limits. The inspiratory effort is low with some crowding of the central vascular markings. Component of vascular congestion cannot be excluded. No focal confluent infiltrate is seen. No bony abnormality is noted. IMPRESSION: Tubes and lines as described. Poor inspiratory effort with crowding of the vascular markings. Some mild vascular congestion cannot be excluded. Electronically Signed   By: Inez Catalina M.D.   On: 07/07/2017 10:31     STUDIES:  CT Head w/o Contrast 6/6>> No  acute intracranial abnormality noted EEG 6/6>> No localizing, lateralizing, interictal epileptiform features were seen during this recording. Impression:The EEG isnormalin awake state only   CULTURES: Blood cultures 6/6>>  ANTIBIOTICS:   SIGNIFICANT EVENTS: 6/6 Admission to [facility] ICU   LINES/TUBES: ETT 6/6>>6/7  DISCUSSION: This is a 47 y.o. Female who presented with unresponsiveness requiring intubation for airway protection.  Urine drug screen negative, CT head negative, CXR without infiltrates, serum ammonia normal.  Rule out infectious etiology.  High suspicion for encephalopathy secondary to adjustment of outpatient psych medications.  ASSESSMENT / PLAN:  PULMONARY A: Intubated for airway protection  due to unresponsiveness Mild pulmonary edema P:   Full vent support, TV 8 cc/kg SBTas tolerated VAP Bundle Pulmonary hygiene Follow intermittent CXR prn Give 20 mg lasix x1 dose on 6/7 EXTUBATE 6/7  CARDIOVASCULAR A:  HTN Hx: HTN P:  ICU monitoring Maintain MAP >65 Hold home Norvasc & Metoprolol for now, once extubated will restart Prn IV Metoprolol for HR >120 or SBP >160  RENAL A:   Acute Kidney Injury>>improving Rhabdomylosis>>improving -CK 633 in am 6/7 (previously 1446) Hypokalemia P:   Follow I&O's / urinary output Follow BMP Replace electrolytes as indicated Replete K 6/7 Ensure adequate renal perfusion Will KVO NS infusion due to pulmonary edema Follow CK  GASTROINTESTINAL A:   No active issues P:   Keep NPO for now, once extubated NPO for 4 hrs then advance diet as tolerated Pepcid for SUP  HEMATOLOGIC A:   Anemia P:  Monitor for s/sx of bleeding Follow CBC Heparin SQ for VTE prophylaxis Transfuse for Hgb<7  INFECTIOUS A:   Leukocytosis>>improving -CXR with no infiltrates -UA negative -PCT 0.24 -Suspect stress response, rule out infection P:   Monitor fever curve Follow WBC's Follow Blood cultures  ENDOCRINE A:   No active issues   P:   Follow glucose with BMP's Follow ICU hyper/hypoglycemia protocol  NEUROLOGIC A:   Acute encephalopathy, unknown etiology, suspect r/t home psych meds>>improved -UDS negative -CT Head negative -EEG: no localizing, lateralizing, or interictal epileptiform features seen Hx: Schizophrenia, bipolar disorder P:   Avoid sedating medications as able Provide supportive care Hold home Clozapine, Gabapentin, Quetiapine, Zolpidem for now Consider Pysch consult for med adjustment   Pt is now awake and following commands, tolerating PSV, will extubate.  Will give 20 mg lasix and KVO IV fluids prior to extubation d/t mild pulmonary edema on CXR.  FAMILY  - Updates: No family at bedside 6/7.  -  Inter-disciplinary family meet or Palliative Care meeting due by:  07/14/17    Darel Hong, AGACNP-BC [facility] Pager: [phone]  07/08/2017, 9:26 AM

## 2017-07-08 NOTE — Care Management Note (Signed)
Case Management Note  Patient Details  Name: Jenna Hill MRN: [id number] Date of Birth: [birth year]  Subjective/Objective:        hhc rn            Action/Plan: [facility] notified.  Expected Discharge Date:  (unknown)               Expected Discharge Plan:  [place]  In-House Referral:     Discharge planning Services  CM Consult  Post Acute Care Choice:    Choice offered to:  Patient  DME Arranged:    DME Agency:     HH Arranged:  RN [place] Agency:  [place]  Status of Service:  In process, will continue to follow  If discussed at Long Length of Stay Meetings, dates discussed:    Additional Comments:  Leeroy Cha, RN 07/08/2017, 2:25 PM

## 2017-07-09 ENCOUNTER — Inpatient Hospital Stay (HOSPITAL_COMMUNITY): Payer: Medicaid Other

## 2017-07-09 ENCOUNTER — Other Ambulatory Visit (HOSPITAL_COMMUNITY): Payer: Self-pay

## 2017-07-09 DIAGNOSIS — F250 Schizoaffective disorder, bipolar type: Secondary | ICD-10-CM

## 2017-07-09 DIAGNOSIS — I361 Nonrheumatic tricuspid (valve) insufficiency: Secondary | ICD-10-CM

## 2017-07-09 DIAGNOSIS — I340 Nonrheumatic mitral (valve) insufficiency: Secondary | ICD-10-CM

## 2017-07-09 LAB — BASIC METABOLIC PANEL
ANION GAP: 10 (ref 5–15)
Anion gap: 10 (ref 5–15)
BUN: 13 mg/dL (ref 6–20)
BUN: 11 mg/dL (ref 6–20)
CALCIUM: 8.8 mg/dL — AB (ref 8.9–10.3)
CALCIUM: 8.7 mg/dL — AB (ref 8.9–10.3)
CHLORIDE: 108 mmol/L (ref 101–111)
CO2: 24 mmol/L (ref 22–32)
CO2: 24 mmol/L (ref 22–32)
CREATININE: 0.88 mg/dL (ref 0.44–1.00)
Chloride: 108 mmol/L (ref 101–111)
Creatinine, Ser: 0.84 mg/dL (ref 0.44–1.00)
GFR calc Af Amer: >60 mL/min (ref >60)
GFR calc Af Amer: >60 mL/min (ref >60)
GLUCOSE: 105 mg/dL — AB (ref 65–99)
Glucose, Bld: 104 mg/dL — ABNORMAL HIGH (ref 65–99)
Potassium: 3 mmol/L — ABNORMAL LOW (ref 3.5–5.1)
Potassium: 3.6 mmol/L (ref 3.5–5.1)
SODIUM: 142 mmol/L (ref 135–145)
SODIUM: 142 mmol/L (ref 135–145)

## 2017-07-09 LAB — ECHOCARDIOGRAM COMPLETE
Height: 63 in
Weight: 3562.63 oz

## 2017-07-09 LAB — CBC
HCT: 31.2 % — ABNORMAL LOW (ref 36.0–46.0)
Hemoglobin: 10.5 g/dL — ABNORMAL LOW (ref 12.0–15.0)
MCH: 28 pg (ref 26.0–34.0)
MCHC: 33.7 g/dL (ref 30.0–36.0)
MCV: 83.2 fL (ref 78.0–100.0)
PLATELETS: 340 10*3/uL (ref 150–400)
RBC: 3.75 MIL/uL — ABNORMAL LOW (ref 3.87–5.11)
RDW: 13.2 % (ref 11.5–15.5)
WBC: 11.3 10*3/uL — ABNORMAL HIGH (ref 4.0–10.5)

## 2017-07-09 MED ORDER — METOPROLOL SUCCINATE ER 50 MG PO TB24
50.0000 mg | ORAL_TABLET | Freq: Every day | ORAL | Status: DC
  Administered 2017-07-10: 50 mg via ORAL
  Filled 2017-07-09: qty 1

## 2017-07-09 MED ORDER — METOPROLOL SUCCINATE ER 25 MG PO TB24
25.0000 mg | ORAL_TABLET | Freq: Once | ORAL | Status: AC
  Administered 2017-07-09: 25 mg via ORAL
  Filled 2017-07-09: qty 1

## 2017-07-09 MED ORDER — POTASSIUM CHLORIDE 20 MEQ/15ML (10%) PO SOLN
40.0000 meq | ORAL | Status: AC
  Administered 2017-07-09 (×2): 40 meq
  Filled 2017-07-09 (×2): qty 30

## 2017-07-09 MED ORDER — FAMOTIDINE 20 MG PO TABS
20.0000 mg | ORAL_TABLET | Freq: Two times a day (BID) | ORAL | Status: DC
  Administered 2017-07-09 – 2017-07-11 (×4): 20 mg via ORAL
  Filled 2017-07-09 (×4): qty 1

## 2017-07-09 MED ORDER — POTASSIUM CHLORIDE CRYS ER 20 MEQ PO TBCR: 30.0000 meq | EXTENDED_RELEASE_TABLET | ORAL | Status: DC

## 2017-07-09 MED ORDER — SODIUM CHLORIDE 0.9 % IV BOLUS
500.0000 mL | Freq: Once | INTRAVENOUS | Status: AC
  Administered 2017-07-09: 500 mL via INTRAVENOUS

## 2017-07-09 NOTE — Progress Notes (Signed)
  Echocardiogram 2D Echocardiogram has been performed.  Sevana Grandinetti T Sybel Standish 07/09/2017, 12:21 PM

## 2017-07-09 NOTE — Progress Notes (Signed)
eLink Physician-Brief Progress Note Patient Name: Jenna Hill DOB: [birth year] MRN: [id number]   Date of Service  07/09/2017  HPI/Events of Note  Hypokalemia  eICU Interventions  Potassium replaced     Intervention Category Intermediate Interventions: Electrolyte abnormality - evaluation and management  [name] 07/09/2017, 4:42 AM

## 2017-07-09 NOTE — Progress Notes (Signed)
[facility] Progress Note  Patient: Jenna Hill YKD:[id number]   PCP: Alfonse Spruce, FNP DOB: 30-Jan-1971   DOA: 07/07/2017   DOS: 07/09/2017   Date of Service: the patient was seen and examined on 07/09/2017  Subjective: Complains of pain on the right leg, feels like burning.  10 out of 10.  Gabapentin did not help.  No nausea no vomiting no chest pain no abdominal pain no fever no chills.  No cough.  Patient appears to have auditory hallucination throughout the conversation.  Brief hospital course: Pt. with PMH of bipolar disorder, hypertension, schizophrenia with chronic auditory hallucination; admitted on 07/07/2017, presented with complaint of unresponsiveness, was found to have polypharmacy.  Patient was not able to protect airway and was intubated in the ER and transferred to ICU.  Extubated on 07/08/2017 and was transferred to [facility].  Psychiatry was consult for medication adjustment Currently further plan is continue med adjustment per psychiatry.  Assessment and Plan: 1.  Acute encephalopathy. Likely toxic due to polypharmacy. Required intubation in the ER. Currently appears to be at baseline. Protecting her airway and on room air and able to tolerate oral diet without any complication.  2.  Schizophrenia. Bipolar 1 disorder. Polypharmacy. Auditory hallucination Psychiatry was consulted. Felt that multiple medication as well as concurrent use of alcohol has brought the patient to hospital. Currently psychiatry recommended to restart Clozaril 200 mg in the morning and 400 mg at night for psychosis, recommended to reduce Seroquel from 400 to 200 mg nightly. Ambien is recommended to be discontinued as patient is unlikely to stop using alcohol. Currently they have recommended to hold gabapentin which was given to patient for neuropathy which patient was using twice a day. Currently has signed off and recommended no indication for inpatient admission.  3.  Sinus  tachycardia. Essential hypertension  Etiology is currently unclear patient does not appear to be dehydrated for sure. Also she reports that she has severe pain in her right foot although on examination she appeared comfortable. CHECK Echocardiogram  TSH Give IV bolus and increase toprol.  Stop amlodipine   4.  Acute kidney injury. Rhabdomyolysis. Renal function stable and back to normal. Monitor. CK normal.  5.  Chronic anemia. No active bleeding. Monitor CBC. Likely from nutritional deficiency.  Diet: Low-salt diet DVT Prophylaxis: subcutaneous Heparin  Advance goals of care discussion: Full code  Family Communication: No family present  Disposition:  Discharge to home, for now transfer to floor.  Consultants: Psychiatry, primary admission with PCCM Procedures: Echocardiogram  Antibiotics: Anti-infectives (From admission, onward)   None       Objective: Physical Exam: Vitals:   07/09/17 0600 07/09/17 0800 07/09/17 1000 07/09/17 1200  BP:  128/90 129/90 120/82  Pulse: (!) 124 (!) 121 (!) 125 (!) 116  Resp: (!) 21  19 (!) 23  Temp:  98.8 F (37.1 C)    TempSrc:  Oral    SpO2: 97% 96% 100% 99%  Weight:      Height:        Intake/Output Summary (Last 24 hours) at 07/09/2017 1301 Last data filed at 07/09/2017 1200 Gross per 24 hour  Intake 530 ml  Output 1002 ml  Net -472 ml   Filed Weights   07/07/17 1459 07/08/17 0457 07/09/17 0500  Weight: 101.2 kg (223 lb 1.7 oz) 101.6 kg (223 lb 15.8 oz) 101 kg (222 lb 10.6 oz)   General: Alert, Awake and Oriented to Time, Place and Person. Appear in mild distress, affect  flat Eyes: PERRL, Conjunctiva normal ENT: Oral Mucosa clear moist. Neck: no JVD, no Abnormal Mass Or lumps Cardiovascular: S1 and S2 Present, no Murmur, Peripheral Pulses Present Respiratory: normal respiratory effort, Bilateral Air entry equal and Decreased, no use of accessory muscle, Clear to Auscultation, no Crackles, no wheezes Abdomen: Bowel  Sound present, Soft and no tenderness, no hernia Skin: no redness, no Rash, no induration Extremities: no Pedal edema, no calf tenderness Neurologic: Grossly no focal neuro deficit. Bilaterally Equal motor strength  Data Reviewed: CBC: Recent Labs  Lab 07/07/17 1022 07/08/17 0258 07/09/17 0309  WBC 22.4* 12.1* 11.3*  NEUTROABS 18.0*  --   --   HGB 10.9* 9.2* 10.5*  HCT 32.1* 27.6* 31.2*  MCV 84.3 85.4 83.2  PLT 412* 357 818   Basic Metabolic Panel: Recent Labs  Lab 07/07/17 1022 07/08/17 0258 07/09/17 0309 07/09/17 0747  NA 138 144 142 142  K 4.5 3.4* 3.0* 3.6  CL 103 112* 108 108  CO2 23 24 24 24   GLUCOSE 128* 71 104* 105*  BUN 30* 21* 13 11  CREATININE 1.42* 1.09* 0.88 0.84  CALCIUM 9.1 8.5* 8.8* 8.7*    Liver Function Tests: Recent Labs  Lab 07/07/17 1022  AST 37  ALT 38  ALKPHOS 49  BILITOT 1.0  PROT 7.9  ALBUMIN 3.9   No results for input(s): LIPASE, AMYLASE in the last 168 hours. Recent Labs  Lab 07/07/17 1023  AMMONIA 14   Coagulation Profile: No results for input(s): INR, PROTIME in the last 168 hours. Cardiac Enzymes: Recent Labs  Lab 07/07/17 1022 07/08/17 0258  CKTOTAL 1,466* 633*   BNP (last 3 results) No results for input(s): PROBNP in the last 8760 hours. CBG: Recent Labs  Lab 07/07/17 0945 07/08/17 0743 07/08/17 1105  GLUCAP 143* 73 79   Studies: No results found.  Scheduled Meds: . cloZAPine  200 mg Oral Daily   And  . cloZAPine  400 mg Oral QHS  . famotidine  20 mg Oral BID  . heparin  5,000 Units Subcutaneous Q8H  . mouth rinse  15 mL Mouth Rinse BID  . [START ON 07/10/2017] metoprolol succinate  50 mg Oral Daily  . QUEtiapine  200 mg Oral QHS   Continuous Infusions: . sodium chloride     PRN Meds: sodium chloride, acetaminophen, metoprolol tartrate, ondansetron (ZOFRAN) IV  Time spent: 35 minutes  Author: Berle Mull, MD [facility] Pager: [phone] 07/09/2017 1:01 PM  If 7PM-7AM, please contact  night-coverage at [email or web address], password [facility]

## 2017-07-10 LAB — BASIC METABOLIC PANEL WITH GFR
Anion gap: 9 (ref 5–15)
BUN: 9 mg/dL (ref 6–20)
CO2: 25 mmol/L (ref 22–32)
Calcium: 9.1 mg/dL (ref 8.9–10.3)
Chloride: 104 mmol/L (ref 101–111)
Creatinine, Ser: 0.85 mg/dL (ref 0.44–1.00)
GFR calc Af Amer: >60 mL/min
GFR calc non Af Amer: >60 mL/min
Glucose, Bld: 159 mg/dL — ABNORMAL HIGH (ref 65–99)
Potassium: 3.3 mmol/L — ABNORMAL LOW (ref 3.5–5.1)
Sodium: 138 mmol/L (ref 135–145)

## 2017-07-10 LAB — CBC
HCT: 33.7 % — ABNORMAL LOW (ref 36.0–46.0)
Hemoglobin: 11.3 g/dL — ABNORMAL LOW (ref 12.0–15.0)
MCH: 28.1 pg (ref 26.0–34.0)
MCHC: 33.5 g/dL (ref 30.0–36.0)
MCV: 83.8 fL (ref 78.0–100.0)
Platelets: 433 K/uL — ABNORMAL HIGH (ref 150–400)
RBC: 4.02 MIL/uL (ref 3.87–5.11)
RDW: 13.3 % (ref 11.5–15.5)
WBC: 9.7 K/uL (ref 4.0–10.5)

## 2017-07-10 LAB — T4, FREE: Free T4: 0.92 ng/dL (ref 0.82–1.77)

## 2017-07-10 LAB — TSH: TSH: 1.726 u[IU]/mL (ref 0.350–4.500)

## 2017-07-10 MED ORDER — METOPROLOL SUCCINATE ER 50 MG PO TB24
75.0000 mg | ORAL_TABLET | Freq: Every day | ORAL | Status: DC
  Administered 2017-07-11: 75 mg via ORAL
  Filled 2017-07-10: qty 1

## 2017-07-10 MED ORDER — POTASSIUM CHLORIDE CRYS ER 20 MEQ PO TBCR
40.0000 meq | EXTENDED_RELEASE_TABLET | Freq: Once | ORAL | Status: AC
  Administered 2017-07-10: 40 meq via ORAL
  Filled 2017-07-10: qty 2

## 2017-07-10 MED ORDER — METOPROLOL SUCCINATE ER 25 MG PO TB24
25.0000 mg | ORAL_TABLET | Freq: Once | ORAL | Status: AC
  Administered 2017-07-10: 25 mg via ORAL

## 2017-07-10 MED ORDER — ENOXAPARIN SODIUM 40 MG/0.4ML ~~LOC~~ SOLN
40.0000 mg | SUBCUTANEOUS | Status: DC
Start: 2017-07-10 — End: 2017-07-11
  Administered 2017-07-10 – 2017-07-11 (×2): 40 mg via SUBCUTANEOUS
  Filled 2017-07-10 (×2): qty 0.4

## 2017-07-10 NOTE — Progress Notes (Signed)
[facility] Progress Note  Patient: Jenna Hill ZHG:[id number]   PCP: Alfonse Spruce, FNP DOB: 14-Sep-1970   DOA: 07/07/2017   DOS: 07/10/2017   Date of Service: the patient was seen and examined on 07/10/2017  Subjective: No acute complaint, still has pain in the leg.  No nausea no vomiting.  Brief hospital course: Pt. with PMH of bipolar disorder, hypertension, schizophrenia with chronic auditory hallucination; admitted on 07/07/2017, presented with complaint of unresponsiveness, was found to have polypharmacy.  Patient was not able to protect airway and was intubated in the ER and transferred to ICU.  Extubated on 07/08/2017 and was transferred to [facility].  Psychiatry was consult for medication adjustment Currently further plan is continue med adjustment per psychiatry.  Assessment and Plan: 1.  Acute encephalopathy. Likely toxic due to polypharmacy. Required intubation in the ER. Currently appears to be at baseline. Protecting her airway and on room air and able to tolerate oral diet without any complication.  2.  Schizophrenia. Bipolar 1 disorder. Polypharmacy. Auditory hallucination Psychiatry was consulted. Felt that multiple medication as well as concurrent use of alcohol has brought the patient to hospital. Currently psychiatry recommended to restart Clozaril 200 mg in the morning and 400 mg at night for psychosis, recommended to reduce Seroquel from 400 to 200 mg nightly. Ambien is recommended to be discontinued as patient is unlikely to stop using alcohol. Currently they have recommended to hold gabapentin which was given to patient for neuropathy which patient was using twice a day. Currently has signed off and recommended no indication for inpatient admission.  3.  Sinus tachycardia. Essential hypertension  Etiology is currently unclear patient does not appear to be dehydrated for sure. Also she reports that she has severe pain in her right foot although on examination  she appeared comfortable. Echocardiogram shows preserved EF, no valvular abnormality.  No other acute abnormality. TSH and free T4 normal Continue to stop amlodipine, increase Toprol to 75 mg.  4.  Acute kidney injury. Rhabdomyolysis. Renal function stable and back to normal. Monitor. CK normal.  5.  Chronic anemia. No active bleeding. Monitor CBC. Likely from nutritional deficiency.  Diet: Low-salt diet DVT Prophylaxis: subcutaneous Heparin  Advance goals of care discussion: Full code  Family Communication: No family present  Disposition:  Discharge to home, likely tomorrow.  Consultants: Psychiatry, primary admission with PCCM Procedures: Echocardiogram  Antibiotics: Anti-infectives (From admission, onward)   None       Objective: Physical Exam: Vitals:   07/09/17 1200 07/09/17 1433 07/09/17 2113 07/10/17 0521  BP: 120/82 139/88 (!) 145/99 (!) 146/93  Pulse: (!) 116 (!) 124 (!) 120 (!) 122  Resp: (!) 23 20 18 14   Temp: 98.7 F (37.1 C) 98.4 F (36.9 C) 98.7 F (37.1 C) 97.9 F (36.6 C)  TempSrc: Oral Oral Oral Oral  SpO2: 99% 100% 99% 100%  Weight:    98.7 kg (217 lb 9.5 oz)  Height:        Intake/Output Summary (Last 24 hours) at 07/10/2017 1439 Last data filed at 07/10/2017 1221 Gross per 24 hour  Intake 480 ml  Output -  Net 480 ml   Filed Weights   07/08/17 0457 07/09/17 0500 07/10/17 0521  Weight: 101.6 kg (223 lb 15.8 oz) 101 kg (222 lb 10.6 oz) 98.7 kg (217 lb 9.5 oz)   General: Alert, Awake and Oriented to Time, Place and Person. Appear in mild distress, affect flat Eyes: PERRL, Conjunctiva normal ENT: Oral Mucosa clear moist. Neck:  no JVD, no Abnormal Mass Or lumps Cardiovascular: S1 and S2 Present, no Murmur, Peripheral Pulses Present Respiratory: normal respiratory effort, Bilateral Air entry equal and Decreased, no use of accessory muscle, Clear to Auscultation, no Crackles, no wheezes Abdomen: Bowel Sound present, Soft and no  tenderness, no hernia Skin: no redness, no Rash, no induration Extremities: no Pedal edema, no calf tenderness Neurologic: Grossly no focal neuro deficit. Bilaterally Equal motor strength  Data Reviewed: CBC: Recent Labs  Lab 07/07/17 1022 07/08/17 0258 07/09/17 0309 07/10/17 0410  WBC 22.4* 12.1* 11.3* 9.7  NEUTROABS 18.0*  --   --   --   HGB 10.9* 9.2* 10.5* 11.3*  HCT 32.1* 27.6* 31.2* 33.7*  MCV 84.3 85.4 83.2 83.8  PLT 412* 357 340 093*   Basic Metabolic Panel: Recent Labs  Lab 07/07/17 1022 07/08/17 0258 07/09/17 0309 07/09/17 0747 07/10/17 0410  NA 138 144 142 142 138  K 4.5 3.4* 3.0* 3.6 3.3*  CL 103 112* 108 108 104  CO2 23 24 24 24 25   GLUCOSE 128* 71 104* 105* 159*  BUN 30* 21* 13 11 9   CREATININE 1.42* 1.09* 0.88 0.84 0.85  CALCIUM 9.1 8.5* 8.8* 8.7* 9.1    Liver Function Tests: Recent Labs  Lab 07/07/17 1022  AST 37  ALT 38  ALKPHOS 49  BILITOT 1.0  PROT 7.9  ALBUMIN 3.9   No results for input(s): LIPASE, AMYLASE in the last 168 hours. Recent Labs  Lab 07/07/17 1023  AMMONIA 14   Coagulation Profile: No results for input(s): INR, PROTIME in the last 168 hours. Cardiac Enzymes: Recent Labs  Lab 07/07/17 1022 07/08/17 0258  CKTOTAL 1,466* 633*   BNP (last 3 results) No results for input(s): PROBNP in the last 8760 hours. CBG: Recent Labs  Lab 07/07/17 0945 07/08/17 0743 07/08/17 1105  GLUCAP 143* 73 79   Studies: No results found.  Scheduled Meds: . cloZAPine  200 mg Oral Daily   And  . cloZAPine  400 mg Oral QHS  . enoxaparin (LOVENOX) injection  40 mg Subcutaneous Q24H  . famotidine  20 mg Oral BID  . mouth rinse  15 mL Mouth Rinse BID  . [START ON 07/11/2017] metoprolol succinate  75 mg Oral Daily  . QUEtiapine  200 mg Oral QHS   Continuous Infusions: . sodium chloride     PRN Meds: sodium chloride, acetaminophen, metoprolol tartrate, ondansetron (ZOFRAN) IV  Time spent: 35 minutes  Author: Berle Mull,  MD [facility] Pager: [phone] 07/10/2017 2:39 PM  If 7PM-7AM, please contact night-coverage at [email or web address], password [facility]

## 2017-07-10 NOTE — Evaluation (Addendum)
Physical Therapy Evaluation Patient Details Name: Jenna Hill MRN: [id number] DOB: 1970-10-25 Today's Date: 07/10/2017   History of Present Illness  Pt. with PMH of bipolar disorder, hypertension, schizophrenia with chronic auditory hallucination; admitted on 07/07/2017, presented with complaint of unresponsiveness, was found to have polypharmacy.  Clinical Impression  Pt admitted with above diagnosis. Pt currently with functional limitations due to the deficits listed below (see PT Problem List).  Pt will benefit from skilled PT to increase their independence and safety with mobility to allow discharge to the venue listed below.  Pt able to ambulate 30' x 2 with RW and CAM boot in place.  She is slightly impulsive and will sit abruptly.  Recommend HHPT.     Follow Up Recommendations Home health PT    Equipment Recommendations  None recommended by PT    Recommendations for Other Services       Precautions / Restrictions Precautions Precautions: Fall Required Braces or Orthoses: Other Brace/Splint Other Brace/Splint: Amb with CAM boot on R Restrictions Weight Bearing Restrictions: No      Mobility  Bed Mobility Overal bed mobility: Needs Assistance Bed Mobility: Supine to Sit     Supine to sit: Min guard        Transfers Overall transfer level: Needs assistance Equipment used: Rolling walker (2 wheeled) Transfers: Sit to/from [facility] Sit to Stand: Min guard;+2 safety/equipment Stand pivot transfers: Min guard;+2 safety/equipment       General transfer comment: Cues for proper hand placement and safe transfer technique  Ambulation/Gait Ambulation/Gait assistance: Min guard;+2 safety/equipment Ambulation Distance (Feet): 30 Feet(x2) Assistive device: Rolling walker (2 wheeled) Gait Pattern/deviations: Wide base of support;Decreased step length - right;Decreased step length - left     General Gait Details: Ambulates with wide BOS and fatigues  quickly and will sit abruptly  Stairs            Wheelchair Mobility    Modified Rankin (Stroke Patients Only)       Balance Overall balance assessment: Needs assistance   Sitting balance-Leahy Scale: Good       Standing balance-Leahy Scale: Poor Standing balance comment: requires UE support                             Pertinent Vitals/Pain Pain Assessment: 0-10 Pain Score: 8  Pain Location: R foot Pain Intervention(s): Monitored during session;Limited activity within patient's tolerance    Home Living Family/patient expects to be discharged to:: Private residence Living Arrangements: Alone Available Help at Discharge: Personal care attendant(two 4 hour shifts) Type of Home: Apartment Home Access: Stairs to enter Entrance Stairs-Rails: None Entrance Stairs-Number of Steps: 5 Home Layout: One level Home Equipment: Environmental consultant - 2 wheels      Prior Function Level of Independence: Independent with assistive device(s)         Comments: Amb with RW and CAM boot     Hand Dominance        Extremity/Trunk Assessment   Upper Extremity Assessment Upper Extremity Assessment: Overall WFL for tasks assessed    Lower Extremity Assessment Lower Extremity Assessment: Generalized weakness;Overall WFL for tasks assessed       Communication      Cognition Arousal/Alertness: Awake/alert Behavior During Therapy: WFL for tasks assessed/performed Overall Cognitive Status: Within Functional Limits for tasks assessed  General Comments      Exercises     Assessment/Plan    PT Assessment Patient needs continued PT services  PT Problem List Decreased strength;Decreased activity tolerance;Decreased balance;Decreased mobility;Decreased coordination       PT Treatment Interventions DME instruction;Gait training;Functional mobility training;Therapeutic activities;Therapeutic exercise;Stair training     PT Goals (Current goals can be found in the Care Plan section)  Acute Rehab PT Goals Patient Stated Goal: continue with therapy at home PT Goal Formulation: With patient Time For Goal Achievement: 07/24/17 Potential to Achieve Goals: Good    Frequency Min 3X/week   Barriers to discharge        Co-evaluation               AM-PAC PT "6 Clicks" Daily Activity  Outcome Measure Difficulty turning over in bed (including adjusting bedclothes, sheets and blankets)?: A Little Difficulty moving from lying on back to sitting on the side of the bed? : A Little Difficulty sitting down on and standing up from a chair with arms (e.g., wheelchair, bedside commode, etc,.)?: A Little Help needed moving to and from a bed to chair (including a wheelchair)?: A Little Help needed walking in hospital room?: A Little Help needed climbing 3-5 steps with a railing? : A Little 6 Click Score: 18    End of Session Equipment Utilized During Treatment: Gait belt Activity Tolerance: Patient tolerated treatment well Patient left: in chair;with call bell/phone within reach;with chair alarm set Nurse Communication: Mobility status PT Visit Diagnosis: Unsteadiness on feet (R26.81);Muscle weakness (generalized) (M62.81);Difficulty in walking, not elsewhere classified (R26.2)    Time: 7416-3845 PT Time Calculation (min) (ACUTE ONLY): 17 min   Charges:   PT Evaluation $PT Eval Low Complexity: 1 Low     PT G Codes:        Lipa Knauff L. Tamala Julian, [place] Pager [phone] 07/10/2017   Galen Manila 07/10/2017, 2:36 PM

## 2017-07-11 ENCOUNTER — Inpatient Hospital Stay (HOSPITAL_COMMUNITY): Payer: Medicaid Other

## 2017-07-11 ENCOUNTER — Encounter (HOSPITAL_COMMUNITY): Payer: Self-pay | Admitting: Radiology

## 2017-07-11 DIAGNOSIS — R609 Edema, unspecified: Secondary | ICD-10-CM

## 2017-07-11 LAB — BASIC METABOLIC PANEL
Anion gap: 8 (ref 5–15)
BUN: 11 mg/dL (ref 6–20)
CALCIUM: 9.1 mg/dL (ref 8.9–10.3)
CO2: 26 mmol/L (ref 22–32)
Chloride: 105 mmol/L (ref 101–111)
Creatinine, Ser: 0.87 mg/dL (ref 0.44–1.00)
GFR calc non Af Amer: >60 mL/min (ref >60)
Glucose, Bld: 129 mg/dL — ABNORMAL HIGH (ref 65–99)
Potassium: 3.7 mmol/L (ref 3.5–5.1)
SODIUM: 139 mmol/L (ref 135–145)

## 2017-07-11 LAB — CBC
HCT: 34.5 % — ABNORMAL LOW (ref 36.0–46.0)
Hemoglobin: 11.4 g/dL — ABNORMAL LOW (ref 12.0–15.0)
MCH: 28.1 pg (ref 26.0–34.0)
MCHC: 33 g/dL (ref 30.0–36.0)
MCV: 85.2 fL (ref 78.0–100.0)
PLATELETS: 441 10*3/uL — AB (ref 150–400)
RBC: 4.05 MIL/uL (ref 3.87–5.11)
RDW: 13.2 % (ref 11.5–15.5)
WBC: 9.1 10*3/uL (ref 4.0–10.5)

## 2017-07-11 LAB — D-DIMER, QUANTITATIVE (NOT AT ARMC): D DIMER QUANT: 1.28 ug{FEU}/mL — AB (ref 0.00–0.50)

## 2017-07-11 MED ORDER — QUETIAPINE FUMARATE ER 200 MG PO TB24: 200.0000 mg | ORAL_TABLET | Freq: Every day | ORAL | 0 refills | Status: AC

## 2017-07-11 MED ORDER — IOPAMIDOL (ISOVUE-370) INJECTION 76%
100.0000 mL | Freq: Once | INTRAVENOUS | Status: AC | PRN
  Administered 2017-07-11: 68 mL via INTRAVENOUS

## 2017-07-11 MED ORDER — METOPROLOL SUCCINATE ER 25 MG PO TB24: 75.0000 mg | ORAL_TABLET | Freq: Every day | ORAL | 0 refills | Status: AC

## 2017-07-11 MED ORDER — IOPAMIDOL (ISOVUE-370) INJECTION 76%
INTRAVENOUS | Status: AC
  Filled 2017-07-11: qty 100

## 2017-07-11 NOTE — Progress Notes (Signed)
Discharge instructions given to patient and Langley Gauss Copes. Called [place] and informed her of new, changed and discontinued medications. Discussed the medications due tonight and home health.

## 2017-07-11 NOTE — Discharge Summary (Signed)
[facility] Discharge Summary   Patient: Jenna Hill ZHY:[id number]   PCP: Alfonse Spruce, FNP DOB: [birth year]   Date of admission: 07/07/2017   Date of discharge:  07/11/2017    Discharge Diagnoses:  Principal Problem:   Schizoaffective disorder, bipolar type ([place]) Active Problems:   Unresponsive   Admitted From: home Disposition:  Home with home health  Recommendations for Outpatient Follow-up:  1. Please follow-up with PCP in 1 week.  Also follow-up with psychiatry. 2.    Follow-up Information    Alfonse Spruce, FNP. Schedule an appointment as soon as possible for a visit in 1 week(s).   Specialty:  Family Medicine Contact information: [place] [address] [phone]        Noemi Chapel, NP. Schedule an appointment as soon as possible for a visit in 1 week(s).   Contact information: [address] [place] [address] [phone]          Diet recommendation: Low-salt diet  Activity: The patient is advised to gradually reintroduce usual activities.  Discharge Condition: good  Code Status: Full code  History of present illness: As per the H and P dictated on admission, "Jenna Hill is a 47 y.o. Female who presents to [facility] ED with report of Unresponsiveness.  Pt is intubated and no family present, therefore history is obtained from the ED notes, staff at bedside, and chart review.  She was found down by her sister (unknown downtime), who called EMS.  In ED, pt was intubated for airway protection due to sonorous breathing.  Initial workup in ED, revealed a negative UDS, and CT head w/o contrast negative for any acute abnormalities. Serum Ammonia is 14, lactic acid 2.74, acetaminophen & salicylate levels negative. CXR is negative for acute infiltrate.  Pt does have a history of schizophrenia and bipolar disorder, for which she follows with Noemi Chapel NP of [place] [phone]).  Pt's  family reports that she saw Psychiatry on 07/04/17 with reported medication adjustment.  Pt does have a history of acute encephalopathy, AKI in the setting of rhabdomyolysis in 04/2017,  requiring intubation, of which that event was noted after outpatient psych medications were adjusted.  PCCM is asked to admit pt for unresponsiveness requiring intubation.  Called Noemi Chapel NP, and confirmed that pt did not receive any injections at her psych office"  Hospital Course:  Summary of her active problems in the hospital is as following. 1.  Acute encephalopathy. Likely toxic due to polypharmacy. Required intubation in the ER. Currently appears to be at baseline. Protecting her airway and on room air and able to tolerate oral diet without any complication.  2.  Schizophrenia. Bipolar 1 disorder. Polypharmacy. Auditory hallucination Psychiatry was consulted. Felt that multiple medication as well as concurrent use of alcohol has brought the patient to hospital. Currently psychiatry recommended to restart Clozaril 200 mg in the morning and 400 mg at night for psychosis, recommended to reduce Seroquel from 400 to 200 mg nightly. Ambien is recommended to be discontinued as patient is unlikely to stop using alcohol. Currently they have recommended to hold gabapentin which was given to patient for neuropathy which patient was using twice a day. Currently has signed off and recommended no indication for inpatient admission. Recommend patient to follow-up with primary psychiatrist in 1 week.  3.  Sinus tachycardia. Essential hypertension  Etiology is currently unclear patient does not appear to be dehydrated for sure. Also she reports  that she has severe pain in her right foot although on examination she appeared comfortable. Echocardiogram shows preserved EF, no valvular abnormality.  No other acute abnormality. TSH and free T4 normal Continue to stop amlodipine, increase Toprol to 75 mg.  4.   Acute kidney injury. Rhabdomyolysis. Renal function stable and back to normal. Monitor. CK normal.  5.  Chronic anemia. No active bleeding. Monitor CBC. Likely from nutritional deficiency.  All other chronic medical condition were stable during the hospitalization.  Patient was seen by physical therapy, who recommended home health, which was arranged by Education officer, museum and case Freight forwarder. On the day of the discharge the patient's [place] , and no other acute medical condition were reported by patient. the patient was felt safe to be discharge at home with home health.  Consultants: psychiatry PCCM primary admit Procedures: intubation   DISCHARGE MEDICATION: Allergies as of 07/11/2017      Reactions   Bee Venom Anaphylaxis   Codeine Swelling, Other (See Comments)   Facial swelling   Other Anaphylaxis, Other (See Comments)   bertrillium or bertillium     Toradol [ketorolac Tromethamine] Swelling   Tongue swelling   Lortab [hydrocodone-acetaminophen] Hives   Percocet [oxycodone-acetaminophen] Hives   Ibuprofen Hives, Other (See Comments)   Raised hives Also with naproxen   Latex Rash      Medication List    STOP taking these medications   amLODipine 10 MG tablet Commonly known as:  NORVASC   gabapentin 300 MG capsule Commonly known as:  NEURONTIN   QUEtiapine 400 MG tablet Commonly known as:  SEROQUEL Replaced by:  QUEtiapine 200 MG 24 hr tablet     TAKE these medications   clozapine 200 MG tablet Commonly known as:  CLOZARIL Take 200-400 mg by mouth 2 (two) times daily. Take one tablet in the morning and two tablets at bedtime.   loratadine 10 MG tablet Commonly known as:  CLARITIN Take 1 tablet (10 mg total) by mouth daily. (May buy from over the counter): For allergies   metoprolol succinate 25 MG 24 hr tablet Commonly known as:  TOPROL-XL Take 3 tablets (75 mg total) by mouth daily. What changed:  how much to take   QUEtiapine 200 MG 24 hr  tablet Commonly known as:  SEROQUEL XR Take 1 tablet (200 mg total) by mouth at bedtime. Replaces:  QUEtiapine 400 MG tablet   zolpidem 10 MG tablet Commonly known as:  AMBIEN Take 10 mg by mouth at bedtime as needed for sleep.      Allergies  Allergen Reactions  . Bee Venom Anaphylaxis  . Codeine Swelling and Other (See Comments)    Facial swelling  . Other Anaphylaxis and Other (See Comments)    bertrillium or bertillium    . Toradol [Ketorolac Tromethamine] Swelling    Tongue swelling  . Lortab [Hydrocodone-Acetaminophen] Hives  . Percocet [Oxycodone-Acetaminophen] Hives  . Ibuprofen Hives and Other (See Comments)    Raised hives Also with naproxen  . Latex Rash   Discharge Instructions    Diet - low sodium heart healthy   Complete by:  As directed    Discharge instructions   Complete by:  As directed    It is important that you read following instructions as well as go over your medication list with RN to help you understand your care after this hospitalization.  Discharge Instructions: Please follow-up with PCP in one week  Please request your primary care physician to go over  all Hospital Tests and Procedure/Radiological results at the follow up,  Please get all Hospital records sent to your PCP by signing hospital release before you go home.   Do not take more than prescribed Pain, Sleep and Anxiety Medications. You were cared for by a hospitalist during your hospital stay. If you have any questions about your discharge medications or the care you received while you were in the hospital after you are discharged, you can call the unit and ask to speak with the hospitalist on call if the hospitalist that took care of you is not available.  Once you are discharged, your primary care physician will handle any further medical issues. Please note that NO REFILLS for any discharge medications will be authorized once you are discharged, as it is imperative that you return to  your primary care physician (or establish a relationship with a primary care physician if you do not have one) for your aftercare needs so that they can reassess your need for medications and monitor your lab values. You Must read complete instructions/literature along with all the possible adverse reactions/side effects for all the Medicines you take and that have been prescribed to you. Take any new Medicines after you have completely understood and accept all the possible adverse reactions/side effects. Wear Seat belts while driving. If you have smoked or chewed Tobacco in the last 2 yrs please stop smoking and/or stop any Recreational drug use.   Increase activity slowly   Complete by:  As directed      Discharge Exam: Filed Weights   07/09/17 0500 07/10/17 0521 07/11/17 0533  Weight: 101 kg (222 lb 10.6 oz) 98.7 kg (217 lb 9.5 oz) 98.6 kg (217 lb 4.8 oz)   Vitals:   07/10/17 2134 07/11/17 0533  BP: 126/88 (!) 133/101  Pulse: (!) 120 (!) 120  Resp: 16 14  Temp: 98.5 F (36.9 C) 98.4 F (36.9 C)  SpO2: 100% 97%   General: Appear in no distress, no Rash; Oral Mucosa moist. Cardiovascular: S1 and S2 Present, no Murmur, no JVD Respiratory: Bilateral Air entry present and Clear to Auscultation, no Crackles, no wheezes Abdomen: Bowel Sound present, Soft and no tenderness Extremities: no Pedal edema, no calf tenderness Neurology: Grossly no focal neuro deficit.  The results of significant diagnostics from this hospitalization (including imaging, microbiology, ancillary and laboratory) are listed below for reference.    Significant Diagnostic Studies: Dg Abd 1 View  Result Date: 07/07/2017 CLINICAL DATA:  Check gastric catheter placement EXAM: ABDOMEN - 1 VIEW COMPARISON:  04/13/2017 FINDINGS: Scattered large and small bowel gas is noted. Gastric catheter is noted coiled within the stomach. Radiopaque density is noted over the pelvis in the midline. This appears to be related to an  extrinsic piercing from prior CT examination. IMPRESSION: Gastric catheter within the stomach. Electronically Signed   By: Inez Catalina M.D.   On: 07/07/2017 15:24   Ct Head Wo Contrast  Result Date: 07/07/2017 CLINICAL DATA:  Altered level of consciousness EXAM: CT HEAD WITHOUT CONTRAST TECHNIQUE: Contiguous axial images were obtained from the base of the skull through the vertex without intravenous contrast. COMPARISON:  04/05/2017 FINDINGS: Brain: No evidence of acute infarction, hemorrhage, hydrocephalus, extra-axial collection or mass lesion/mass effect. Vascular: No hyperdense vessel or unexpected calcification. Skull: Normal. Negative for fracture or focal lesion. Sinuses/Orbits: No acute finding. Other: None. IMPRESSION: No acute intracranial abnormality noted. No change from the prior exam. Electronically Signed   By: Linus Mako.D.  On: 07/07/2017 10:05   Ct Angio Chest Pe W Or Wo Contrast  Result Date: 07/11/2017 CLINICAL DATA:  47 year old female with shortness of breath. D-dimer positive. EXAM: CT ANGIOGRAPHY CHEST WITH CONTRAST TECHNIQUE: Multidetector CT imaging of the chest was performed using the standard protocol during bolus administration of intravenous contrast. Multiplanar CT image reconstructions and MIPs were obtained to evaluate the vascular anatomy. CONTRAST:  20mL ISOVUE-370 IOPAMIDOL (ISOVUE-370) INJECTION 76% COMPARISON:  Chest CT 05/28/2017. FINDINGS: Comment: Suboptimal contrast bolus slightly limits accurate assessment for distal subsegmental sized pulmonary embolism. Cardiovascular: There are no central, lobar or segmental sized filling defects within the pulmonary arterial tree to suggest underlying pulmonary embolism. Heart size is normal. There is no significant pericardial fluid, thickening or pericardial calcification. There is aortic atherosclerosis, as well as atherosclerosis of the great vessels of the mediastinum and the coronary arteries, including calcified  atherosclerotic plaque in the left anterior descending coronary artery. Mediastinum/Nodes: No pathologically enlarged mediastinal or hilar lymph nodes. Heterogeneously enlarged thyroid gland with multiple nodules, similar to the prior study, presumably reflective of a goiter. The largest of these nodules is on the right side measuring up to 3.5 x 2.5 cm. Esophagus is unremarkable in appearance. No axillary lymphadenopathy. Lungs/Pleura: No suspicious appearing pulmonary nodules or masses. No acute consolidative airspace disease. No pleural effusions. Upper Abdomen: Unremarkable. Musculoskeletal: There are no aggressive appearing lytic or blastic lesions noted in the visualized portions of the skeleton. Review of the MIP images confirms the above findings. IMPRESSION: 1. Despite the limitations of today's examination, there is no evidence of clinically significant central, lobar or segmental sized pulmonary embolism. 2. No acute findings. 3. Aortic atherosclerosis, in addition to left anterior descending coronary artery disease. Please note that although the presence of coronary artery calcium documents the presence of coronary artery disease, the severity of this disease and any potential stenosis cannot be assessed on this non-gated CT examination. Assessment for potential risk factor modification, dietary therapy or pharmacologic therapy may be warranted, if clinically indicated. 4. Enlarged and heterogeneous appearing thyroid gland with multiple nodules, largest of which measures 3.5 x 2.5 cm. Further evaluation with nonemergent thyroid ultrasound is recommended in the near future to better evaluate this lesion. This recommendation follows ACR consensus guidelines: Managing Incidental Thyroid Nodules Detected on Imaging: White Paper of the ACR Incidental Thyroid Findings Committee. J Am Coll Radiol 2015;12(2):143-150. Aortic Atherosclerosis (ICD10-I70.0). Electronically Signed   By: Vinnie Langton M.D.   On:  07/11/2017 12:16   Dg Chest Port 1 View  Result Date: 07/08/2017 CLINICAL DATA:  Acute onset of respiratory failure. EXAM: PORTABLE CHEST 1 VIEW COMPARISON:  Chest radiograph performed 07/07/2017 FINDINGS: The patient's endotracheal tube is seen ending 1 cm above the carina. This could be retracted 2 cm. An enteric tube is noted extending below the diaphragm. Vascular congestion is noted. Bibasilar airspace opacities may reflect pulmonary edema or pneumonia. A small left pleural effusion is noted. No pneumothorax is seen. The cardiomediastinal silhouette is borderline normal in size. No acute osseous abnormalities are identified. Degenerative change is noted at the inferior right glenoid. IMPRESSION: 1. Endotracheal tube seen ending 1 cm above the carina. This could be retracted 2 cm. 2. Vascular congestion noted. Bibasilar airspace opacities may reflect pulmonary edema or pneumonia. Small left pleural effusion noted. Electronically Signed   By: Garald Balding M.D.   On: 07/08/2017 04:23   Dg Chest Portable 1 View  Result Date: 07/07/2017 CLINICAL DATA:  Shortness of Breath EXAM: PORTABLE CHEST  1 VIEW COMPARISON:  05/28/2017 FINDINGS: Endotracheal tube is noted 15 mm above the carina. Nasogastric catheter is noted within the stomach. Cardiac shadow is within normal limits. The inspiratory effort is low with some crowding of the central vascular markings. Component of vascular congestion cannot be excluded. No focal confluent infiltrate is seen. No bony abnormality is noted. IMPRESSION: Tubes and lines as described. Poor inspiratory effort with crowding of the vascular markings. Some mild vascular congestion cannot be excluded. Electronically Signed   By: Inez Catalina M.D.   On: 07/07/2017 10:31    Microbiology: Recent Results (from the past 240 hour(s))  MRSA PCR Screening     Status: None   Collection Time: 07/07/17  3:02 PM  Result Value Ref Range Status   MRSA by PCR NEGATIVE NEGATIVE Final     Comment:        The GeneXpert MRSA Assay (FDA approved for NASAL specimens only), is one component of a comprehensive MRSA colonization surveillance program. It is not intended to diagnose MRSA infection nor to guide or monitor treatment for MRSA infections. Performed at [facility], [place] [address] [place], [place] [address]   Culture, blood (routine x 2)     Status: None (Preliminary result)   Collection Time: 07/07/17  3:05 PM  Result Value Ref Range Status   Specimen Description   Final    BLOOD LEFT ANTECUBITAL Performed at [facility] [address] [place], [place] [address]    Special Requests   Final    BOTTLES DRAWN AEROBIC AND ANAEROBIC Blood Culture adequate volume Performed at [facility] [address] [place], [place] [address]    Culture   Final    NO GROWTH 4 DAYS Performed at [facility], [place] [address], [place], [place] [address]    Report Status PENDING  Incomplete  Culture, blood (routine x 2)     Status: None (Preliminary result)   Collection Time: 07/07/17  3:09 PM  Result Value Ref Range Status   Specimen Description   Final    BLOOD RIGHT HAND Performed at [facility] [address], [place], [place] [address]    Special Requests   Final    BOTTLES DRAWN AEROBIC ONLY Blood Culture adequate volume   Culture   Final    NO GROWTH 4 DAYS Performed at [facility], [place] [address], [place], [place] [address]    Report Status PENDING  Incomplete     Labs: CBC: Recent Labs  Lab 07/07/17 1022 07/08/17 0258 07/09/17 0309 07/10/17 0410 07/11/17 0443  WBC 22.4* 12.1* 11.3* 9.7 9.1  NEUTROABS 18.0*  --   --   --   --   HGB 10.9* 9.2* 10.5* 11.3* 11.4*  HCT 32.1* 27.6* 31.2* 33.7* 34.5*  MCV 84.3 85.4 83.2 83.8 85.2  PLT 412* 357 340 433* 144*   Basic Metabolic Panel: Recent Labs  Lab 07/08/17 0258 07/09/17 0309 07/09/17 0747 07/10/17 0410  07/11/17 0443  NA 144 142 142 138 139  K 3.4* 3.0* 3.6 3.3* 3.7  CL 112* 108 108 104 105  CO2 24 24 24 25 26   GLUCOSE 71 104* 105* 159* 129*  BUN 21* 13 11 9 11   CREATININE 1.09* 0.88 0.84 0.85 0.87  CALCIUM 8.5* 8.8* 8.7* 9.1 9.1   Liver Function Tests: Recent Labs  Lab 07/07/17 1022  AST 37  ALT 38  ALKPHOS 49  BILITOT 1.0  PROT 7.9  ALBUMIN 3.9   No results for input(s): LIPASE, AMYLASE in the last 168 hours. Recent Labs  Lab 07/07/17 1023  AMMONIA 14   Cardiac Enzymes: Recent Labs  Lab 07/07/17 1022 07/08/17 0258  CKTOTAL 1,466* 633*   BNP (last 3 results) Recent Labs    04/05/17 1751  BNP 36.9   CBG: Recent Labs  Lab 07/07/17 0945 07/08/17 0743 07/08/17 1105  GLUCAP 143* 73 79   Time spent: 35 minutes  Signed:  Berle Mull  [facility]  07/11/2017  , 3:27 PM

## 2017-07-11 NOTE — Progress Notes (Signed)
*  Preliminary Results* Bilateral lower extremity venous duplex completed. Bilateral lower extremities are negative for deep vein thrombosis. There is no evidence of Baker's cyst bilaterally.  07/11/2017 9:30 AM Maudry Mayhew, BS, RVT, RDCS, RDMS

## 2017-07-12 LAB — CULTURE, BLOOD (ROUTINE X 2)
CULTURE: NO GROWTH
CULTURE: NO GROWTH
SPECIAL REQUESTS: ADEQUATE
Special Requests: ADEQUATE

## 2017-07-16 ENCOUNTER — Other Ambulatory Visit: Payer: Self-pay

## 2017-07-16 ENCOUNTER — Emergency Department (HOSPITAL_COMMUNITY): Payer: Medicaid Other

## 2017-07-16 ENCOUNTER — Emergency Department (HOSPITAL_COMMUNITY)
Admission: EM | Admit: 2017-07-16 | Discharge: 2017-07-16 | Disposition: A | Discharge status: Other Acute Inpatient Hospital | Payer: Medicaid Other | Attending: Emergency Medicine | Admitting: Emergency Medicine

## 2017-07-16 ENCOUNTER — Encounter (HOSPITAL_COMMUNITY): Payer: Self-pay | Admitting: Emergency Medicine

## 2017-07-16 DIAGNOSIS — F250 Schizoaffective disorder, bipolar type: Secondary | ICD-10-CM

## 2017-07-16 DIAGNOSIS — Z046 Encounter for general psychiatric examination, requested by authority: Secondary | ICD-10-CM | POA: Diagnosis present

## 2017-07-16 DIAGNOSIS — I5032 Chronic diastolic (congestive) heart failure: Secondary | ICD-10-CM | POA: Insufficient documentation

## 2017-07-16 DIAGNOSIS — F319 Bipolar disorder, unspecified: Secondary | ICD-10-CM | POA: Insufficient documentation

## 2017-07-16 DIAGNOSIS — Z79899 Other long term (current) drug therapy: Secondary | ICD-10-CM | POA: Insufficient documentation

## 2017-07-16 DIAGNOSIS — I110 Hypertensive heart disease with heart failure: Secondary | ICD-10-CM | POA: Diagnosis not present

## 2017-07-16 DIAGNOSIS — R45851 Suicidal ideations: Secondary | ICD-10-CM | POA: Diagnosis not present

## 2017-07-16 DIAGNOSIS — F323 Major depressive disorder, single episode, severe with psychotic features: Secondary | ICD-10-CM | POA: Diagnosis not present

## 2017-07-16 DIAGNOSIS — Z91040 Latex allergy status: Secondary | ICD-10-CM | POA: Insufficient documentation

## 2017-07-16 LAB — ETHANOL: Alcohol, Ethyl (B): <10 mg/dL (ref <10)

## 2017-07-16 LAB — CBC
HCT: 40.1 % (ref 36.0–46.0)
HEMOGLOBIN: 13 g/dL (ref 12.0–15.0)
MCH: 27.7 pg (ref 26.0–34.0)
MCHC: 32.4 g/dL (ref 30.0–36.0)
MCV: 85.5 fL (ref 78.0–100.0)
PLATELETS: 454 10*3/uL — AB (ref 150–400)
RBC: 4.69 MIL/uL (ref 3.87–5.11)
RDW: 13 % (ref 11.5–15.5)
WBC: 23.5 10*3/uL — AB (ref 4.0–10.5)

## 2017-07-16 LAB — URINALYSIS, ROUTINE W REFLEX MICROSCOPIC
BACTERIA UA: NONE SEEN
Bilirubin Urine: NEGATIVE
GLUCOSE, UA: NEGATIVE mg/dL
Hgb urine dipstick: NEGATIVE
Ketones, ur: NEGATIVE mg/dL
Nitrite: NEGATIVE
Protein, ur: 30 mg/dL — AB
Specific Gravity, Urine: 1.018 (ref 1.005–1.030)
pH: 7 (ref 5.0–8.0)

## 2017-07-16 LAB — COMPREHENSIVE METABOLIC PANEL
ALK PHOS: 52 U/L (ref 38–126)
ALT: 24 U/L (ref 14–54)
AST: 19 U/L (ref 15–41)
Albumin: 4.1 g/dL (ref 3.5–5.0)
Anion gap: 10 (ref 5–15)
BUN: 18 mg/dL (ref 6–20)
CALCIUM: 10 mg/dL (ref 8.9–10.3)
CHLORIDE: 104 mmol/L (ref 101–111)
CO2: 22 mmol/L (ref 22–32)
CREATININE: 1.17 mg/dL — AB (ref 0.44–1.00)
GFR, EST NON AFRICAN AMERICAN: 55 mL/min — AB (ref >60)
Glucose, Bld: 130 mg/dL — ABNORMAL HIGH (ref 65–99)
Potassium: 3.7 mmol/L (ref 3.5–5.1)
Sodium: 136 mmol/L (ref 135–145)
Total Bilirubin: 0.5 mg/dL (ref 0.3–1.2)
Total Protein: 8.4 g/dL — ABNORMAL HIGH (ref 6.5–8.1)

## 2017-07-16 LAB — I-STAT BETA HCG BLOOD, ED (MC, WL, AP ONLY)

## 2017-07-16 LAB — COOXEMETRY PANEL
Carboxyhemoglobin: 2.9 % — ABNORMAL HIGH (ref 0.5–1.5)
METHEMOGLOBIN: 1.5 % (ref 0.0–1.5)
O2 Saturation: 61.2 %
TOTAL HEMOGLOBIN: 13.1 g/dL (ref 12.0–16.0)

## 2017-07-16 LAB — RAPID URINE DRUG SCREEN, HOSP PERFORMED
AMPHETAMINES: NOT DETECTED
Benzodiazepines: NOT DETECTED
Cocaine: NOT DETECTED
OPIATES: NOT DETECTED
TETRAHYDROCANNABINOL: NOT DETECTED

## 2017-07-16 LAB — ACETAMINOPHEN LEVEL: Acetaminophen (Tylenol), Serum: <10 ug/mL — ABNORMAL LOW (ref 10–30)

## 2017-07-16 LAB — SALICYLATE LEVEL

## 2017-07-16 MED ORDER — CLOZAPINE 100 MG PO TABS: 200.0000 mg | ORAL_TABLET | Freq: Every day | ORAL | Status: DC

## 2017-07-16 MED ORDER — CLOZAPINE 100 MG PO TABS: 400.0000 mg | ORAL_TABLET | Freq: Every day | ORAL | Status: DC

## 2017-07-16 MED ORDER — METOPROLOL SUCCINATE ER 50 MG PO TB24
75.0000 mg | ORAL_TABLET | Freq: Every day | ORAL | Status: DC
  Administered 2017-07-16: 75 mg via ORAL
  Filled 2017-07-16: qty 1

## 2017-07-16 MED ORDER — QUETIAPINE FUMARATE ER 200 MG PO TB24: 200.0000 mg | ORAL_TABLET | Freq: Every day | ORAL | Status: DC

## 2017-07-16 NOTE — ED Notes (Signed)
Regular Diet was ordered for Dinner. 

## 2017-07-16 NOTE — ED Triage Notes (Signed)
Sheriff arrived to provide transport to [facility].
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
## 2017-07-16 NOTE — Progress Notes (Signed)
Patient meets criteria for inpatient treatment. There are currently no appropriate beds available at [facility] per [facility]. CSW faxed referrals to the following facilities for review.  [place], [place], [place], [place], [place], [place], [place], [place], [place], [place], [place], [place], [place], [place], [place], [place], and [place].   TTS will continue to seek bed placement.     Herbert Moors, MSW, LCSW-A, LCAS-A 07/16/2017 3:00 PM

## 2017-07-16 NOTE — ED Notes (Signed)
Pt aware of need for urine  

## 2017-07-16 NOTE — ED Notes (Signed)
Pt eating lunch

## 2017-07-16 NOTE — ED Triage Notes (Signed)
Pt assisted with putting pants on and cam boot. And personal sport shoe. Pt was observed transfering from chair to [place] unassisted . TC to sheriff cell phone and plan to transport PT in [place] [facility].

## 2017-07-16 NOTE — ED Notes (Signed)
Pt transported via [place] to [facility] via [employer] .

## 2017-07-16 NOTE — ED Notes (Signed)
From Respiratory:  HGB 13.1 FO2 68.2 CARB 2.9 Met 1.5

## 2017-07-16 NOTE — ED Triage Notes (Signed)
Tc to [facility] office to ask If Pat's personal WC  Can be transported to [facility]. SW at [facility] reported Pt will need to be transported to [facility] by EMS with the sheriff driving behind the EMS transport .

## 2017-07-16 NOTE — Progress Notes (Signed)
Patient has been accepted to [facility]. The accepting provider is Dr. Reece Levy. The reporting number is [phone]. Bed is now available. CSW has notified patient's nurse of placement details.   Herbert Moors MSW, LCSW-A, LCAS-A Clinical Social Worker 07/16/2017 3:46 PM
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
## 2017-07-18 LAB — URINE CULTURE: Culture: >=100000 — AB

## 2017-07-19 ENCOUNTER — Telehealth: Payer: Self-pay | Admitting: *Deleted

## 2017-07-19 NOTE — Telephone Encounter (Signed)
Post ED Visit - Positive Culture Follow-up  Culture report reviewed by antimicrobial stewardship pharmacist:  []  Elenor Quinones, Pharm.D. []  Heide Guile, Pharm.D., BCPS AQ-ID []  Parks Neptune, Pharm.D., BCPS []  Alycia Rossetti, Pharm.D., BCPS []  [place], [place].D., BCPS, AAHIVP []  Legrand Como, Pharm.D., BCPS, AAHIVP []  Salome Arnt, PharmD, BCPS []  Wynell Balloon, PharmD []  Vincenza Hews, PharmD, BCPS [facility], Pharm D/ [facility], PA-C  Positive urine culture No further patient follow-up is required at this time.  Harlon Flor [facility] 07/19/2017, 12:39 PM

## 2017-09-23 ENCOUNTER — Ambulatory Visit: Payer: Self-pay | Admitting: Internal Medicine

## 2018-01-05 LAB — BLOOD GAS, ARTERIAL
Bicarbonate: 15 mmol/L — ABNORMAL LOW (ref 20.0–28.0)
DRAWN BY: 257701
FIO2: 100
MECHVT: 500 mL
O2 SAT: 98.8 %
PEEP/CPAP: 5 cmH2O
PO2 ART: 460 mmHg — AB (ref 83.0–108.0)
Patient temperature: 88
RATE: 18 resp/min
pCO2 arterial: 22.2 mmHg — ABNORMAL LOW (ref 32.0–48.0)
pH, Arterial: 7.408 (ref 7.350–7.450)

## 2019-05-16 IMAGING — US US ABDOMEN LIMITED
1 series · 14 of 25 positions shown · non-contrast
Comparison: CT scan of the abdomen dated 04/13/2017

CLINICAL DATA: Abnormal transaminase.

EXAM:
ULTRASOUND ABDOMEN LIMITED RIGHT UPPER QUADRANT

[Series 1: us abdomen limited · 0.28mm/px · 14 of 44 slices shown]
[im 1/44]
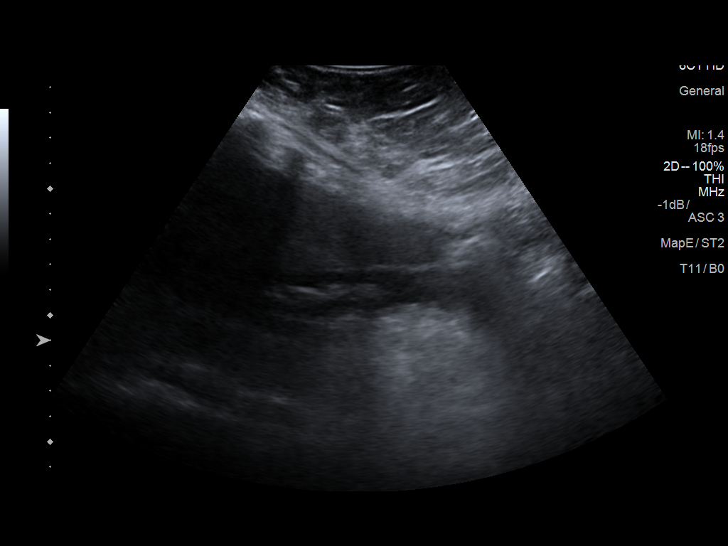
[im 4/44]
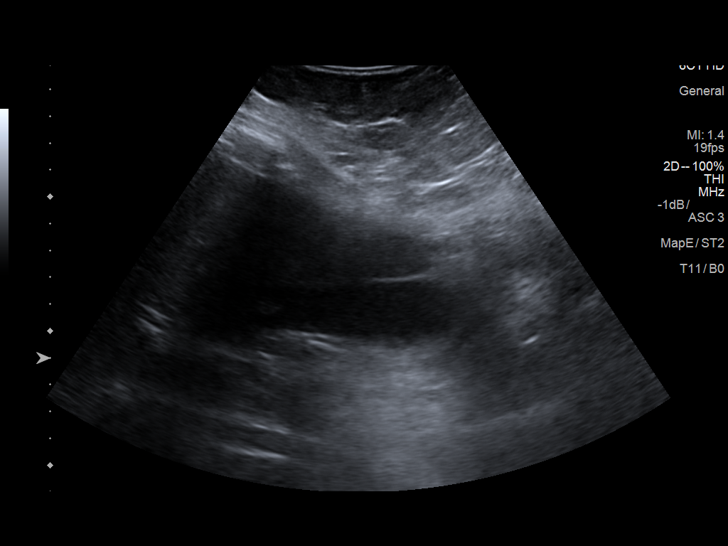
[im 8/44]
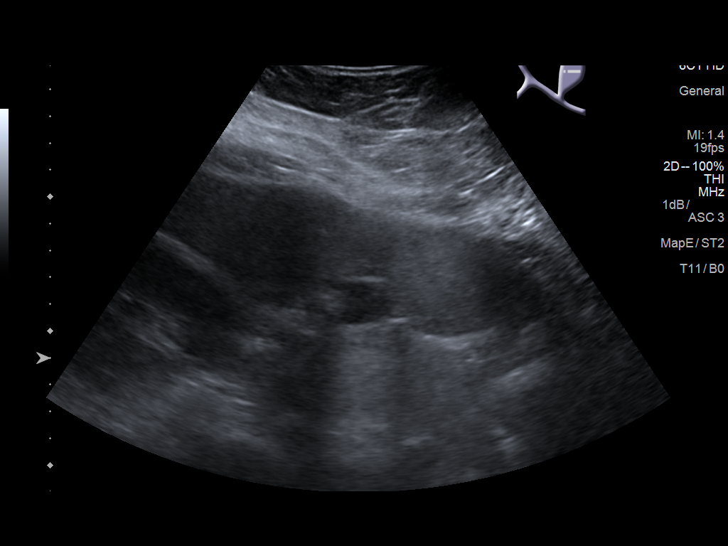
[im 11/44]
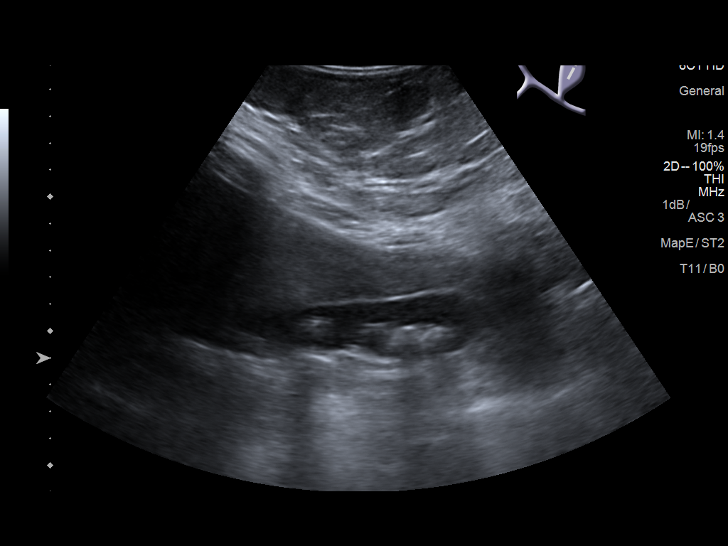
[im 15/44]
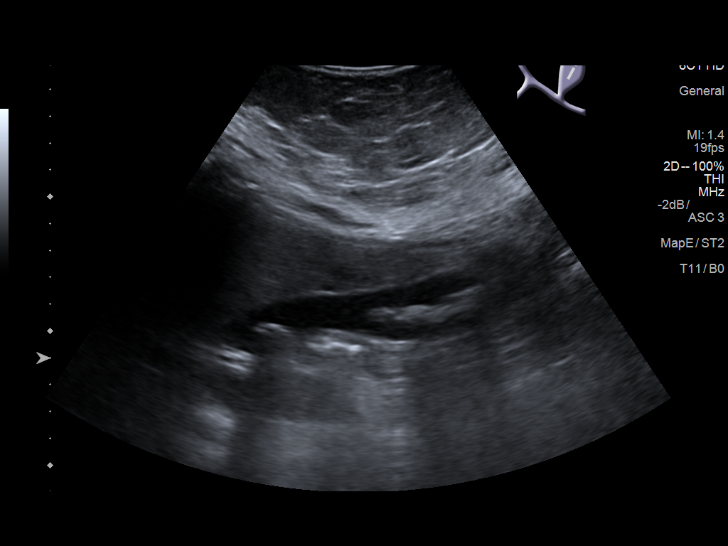
[im 17/44]
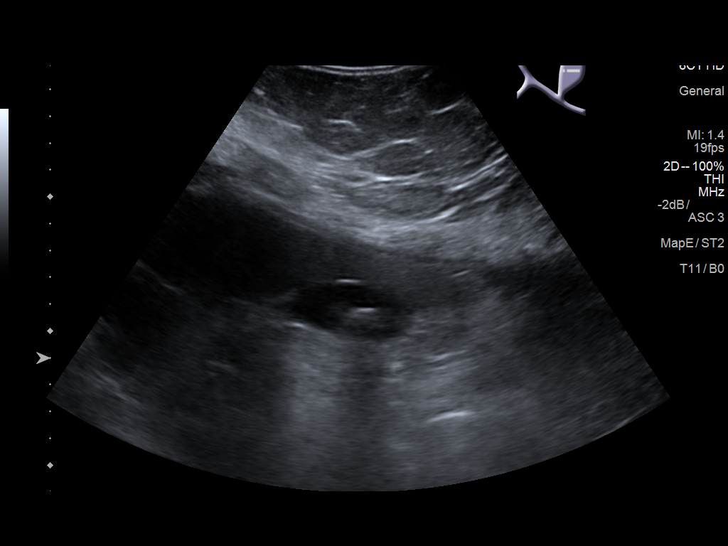
[im 20/44]
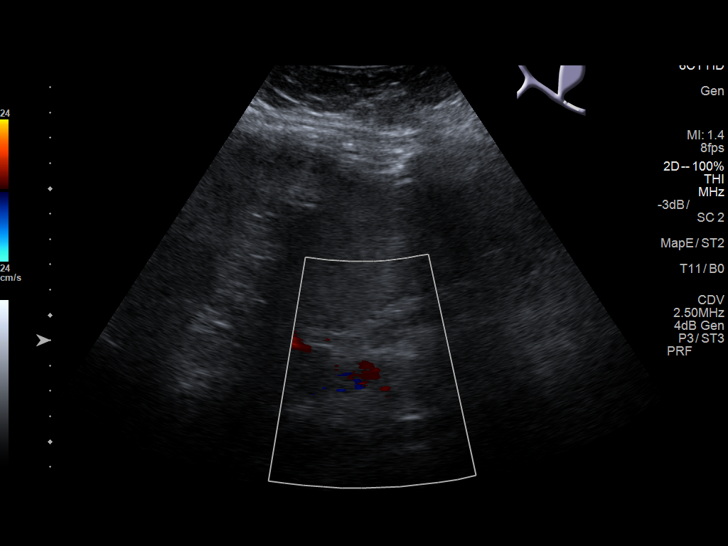
[im 24/44]
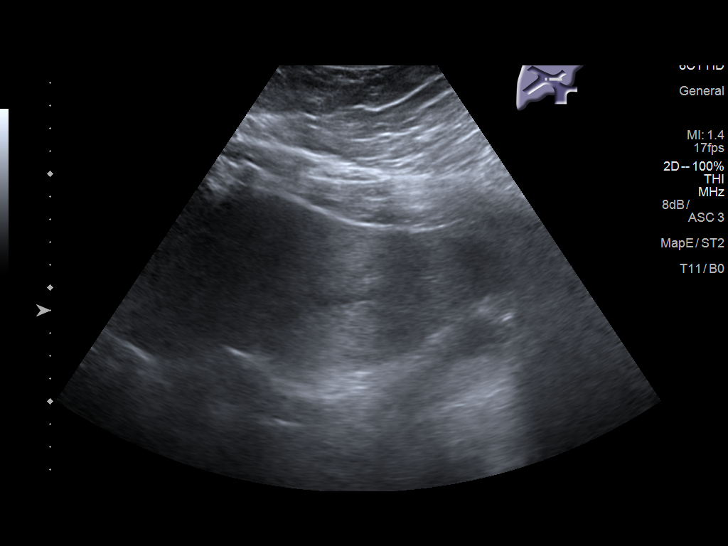
[im 27/44]
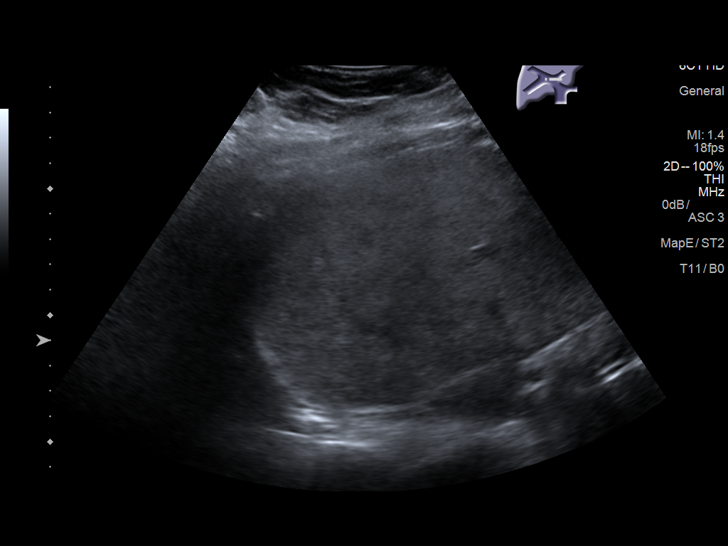
[im 29/44]
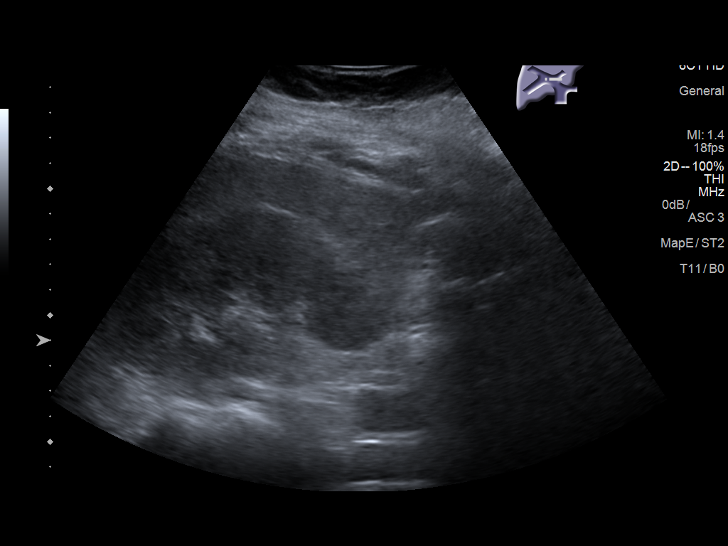
[im 33/44]
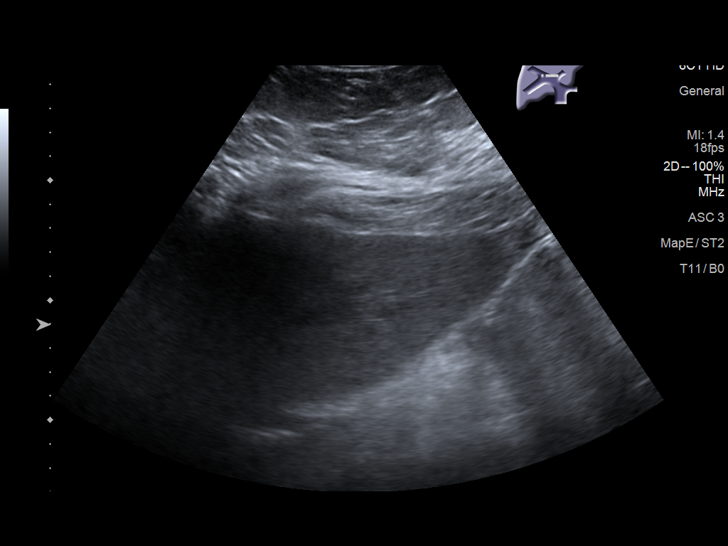
[im 36/44]
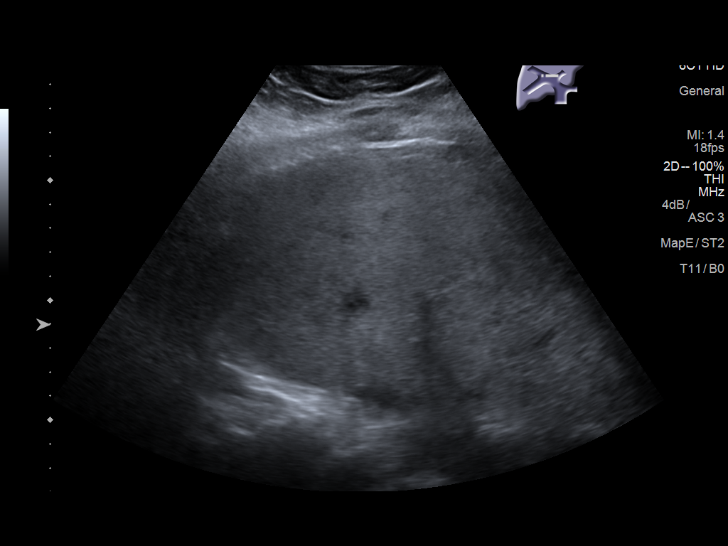
[im 40/44]
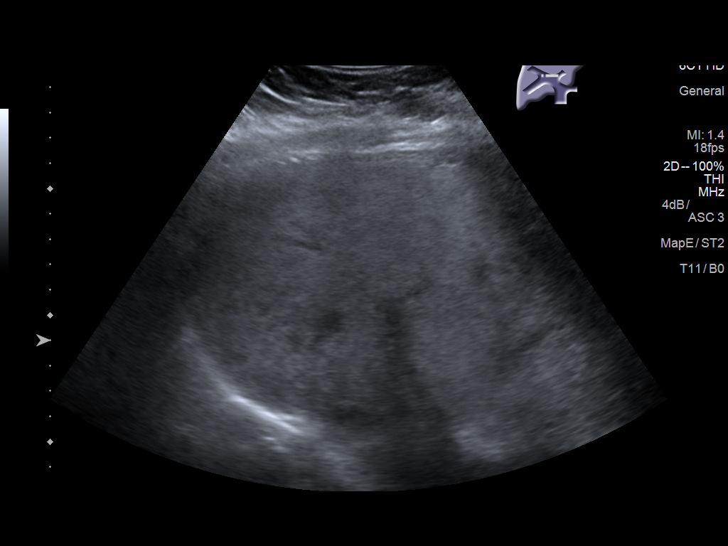
[im 44/44]
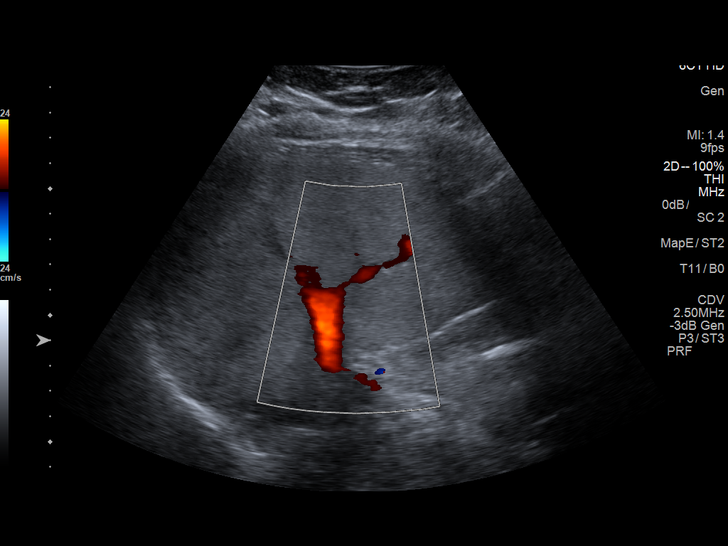

[14 of 25 positions shown; findings below may reference images not displayed]

FINDINGS: Gallbladder:

Multiple gallstones. Gallbladder wall is not thickened. Negative
sonographic Murphy's sign.

Common bile duct:

Diameter: 3.8 mm, normal.

Liver:

No focal lesion identified. Within normal limits in parenchymal
echogenicity. Portal vein is patent on color Doppler imaging with
normal direction of blood flow towards the liver.
IMPRESSION: 1. Cholelithiasis.
2. Incidental note made of right pleural effusion.

## 2019-10-24 IMAGING — CT CT HEAD W/O CM
3 series · 15 of 47 positions shown, 18 images · non-contrast
Comparison: 03/18/2012 CT

CLINICAL DATA: Altered level of consciousness. Overdose of unknown
substance. Incontinence of urine upon arrival.

EXAM:
CT HEAD WITHOUT CONTRAST
TECHNIQUE: Contiguous axial images were obtained from the base of the skull
through the vertex without intravenous contrast.

[Series 2: head wo · axial · 0.47mm/px · z∈[-174,-49]mm · 9 of 30 slices shown, 12 images]
[im 3/30  brain]
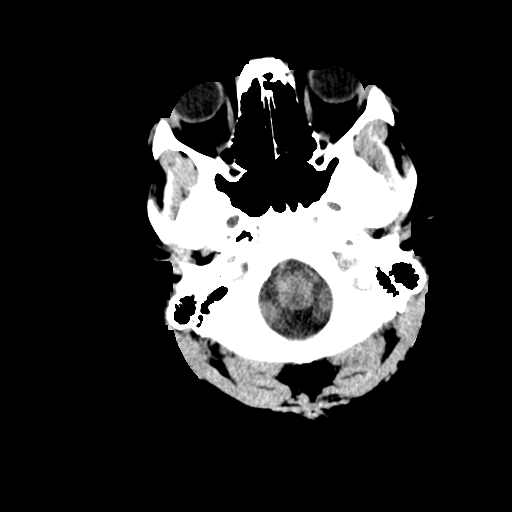
[im 3/30  bone]
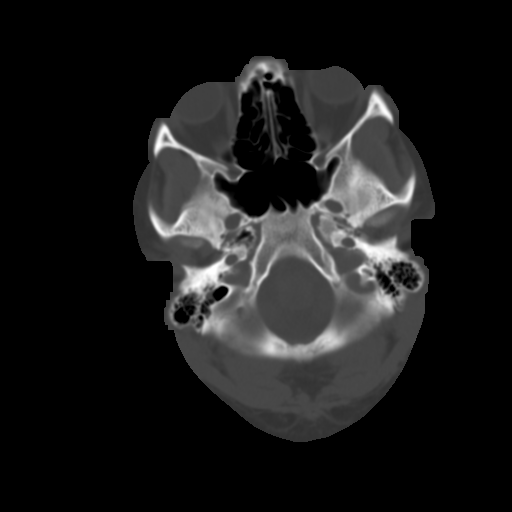
[im 6/30  brain]
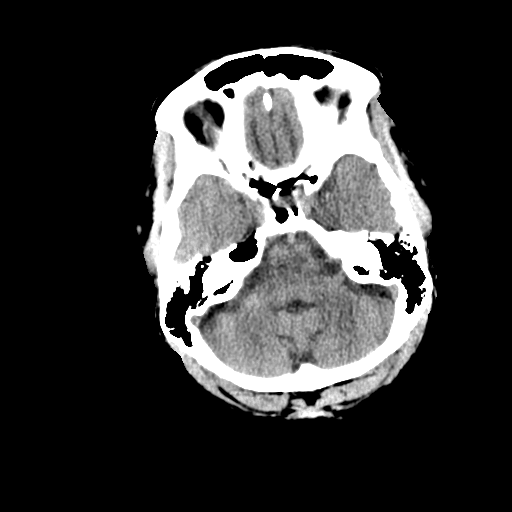
[im 9/30  brain]
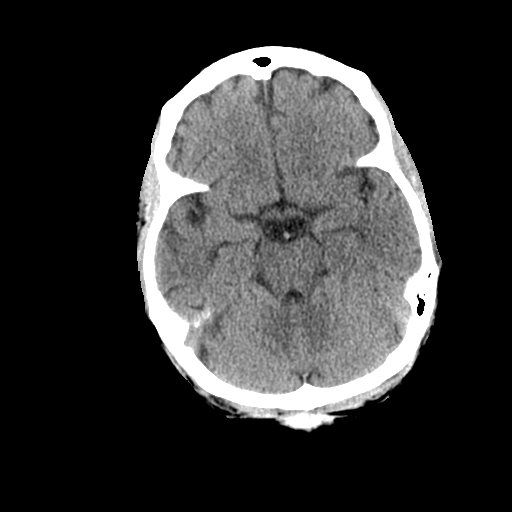
[im 12/30  brain]
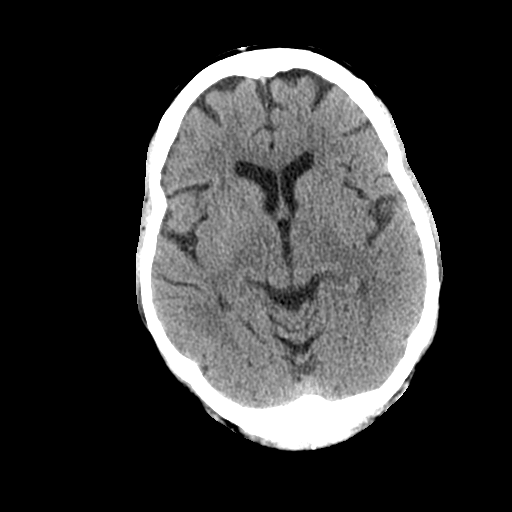
[im 16/30  brain]
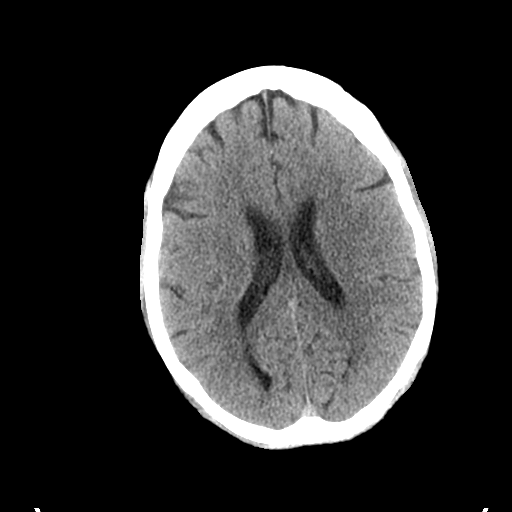
[im 16/30  bone]
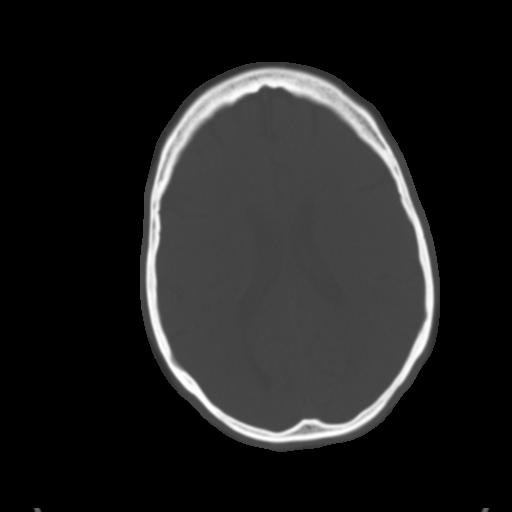
[im 19/30  brain]
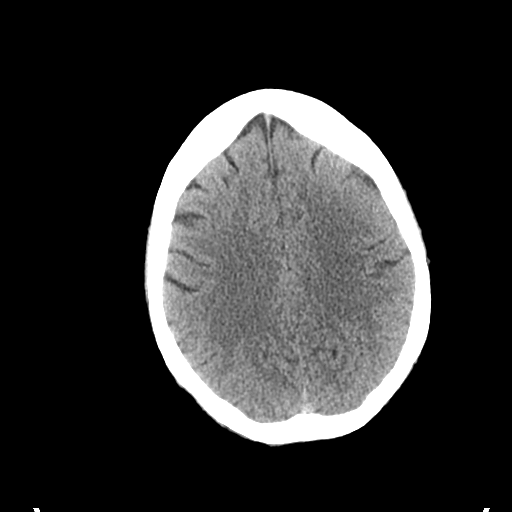
[im 22/30  brain]
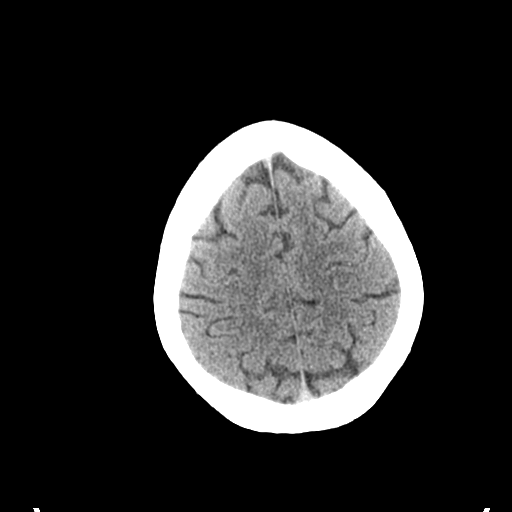
[im 25/30  brain]
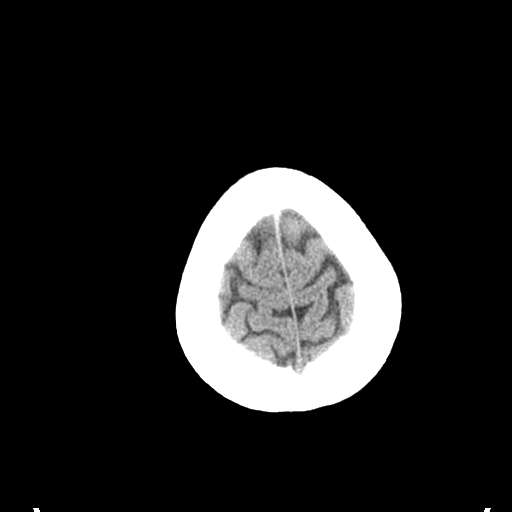
[im 28/30  brain]
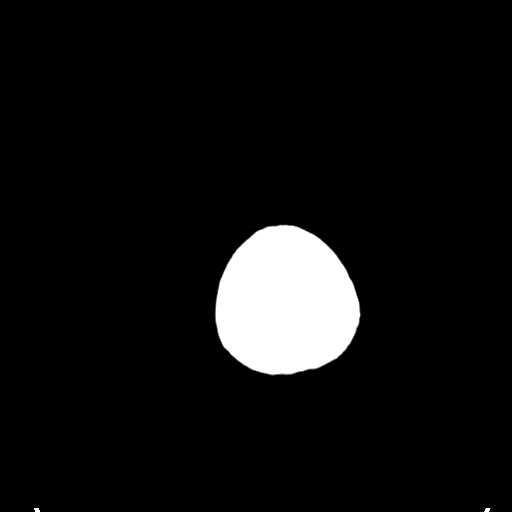
[im 28/30  bone]
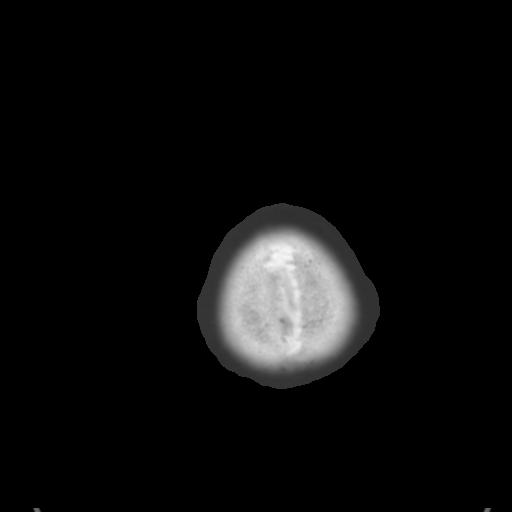

[Series 4: coronal soft tissue · coronal · 0.29mm/px · 3 of 65 slices shown]
[im 22/65  brain]
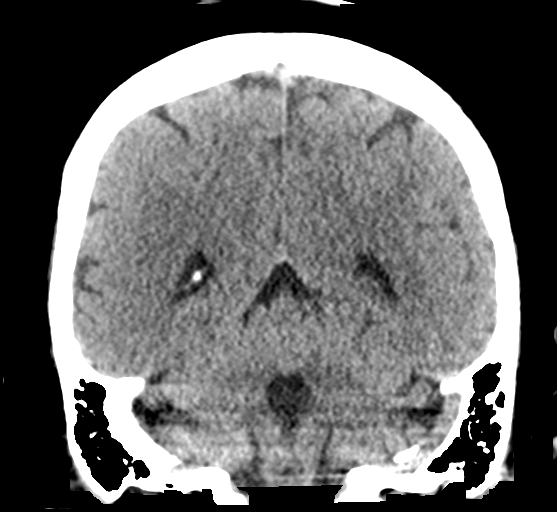
[im 29/65  brain]
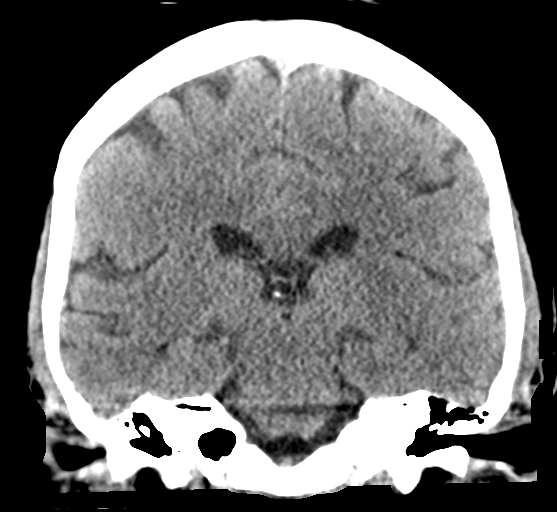
[im 36/65  brain]
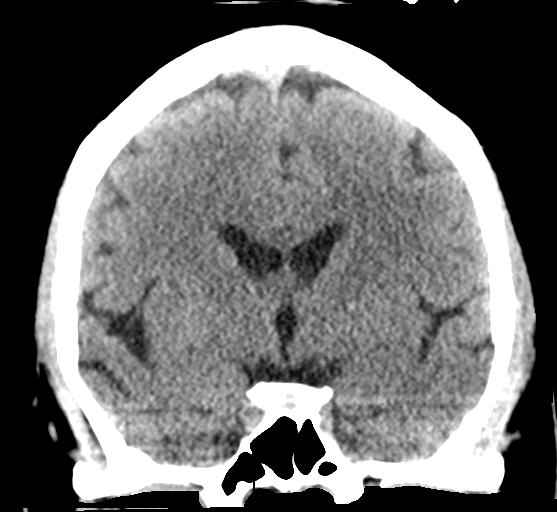

[Series 5: sagittal soft tissue · sagittal · 0.29mm/px · 3 of 54 slices shown]
[im 18/54  brain]
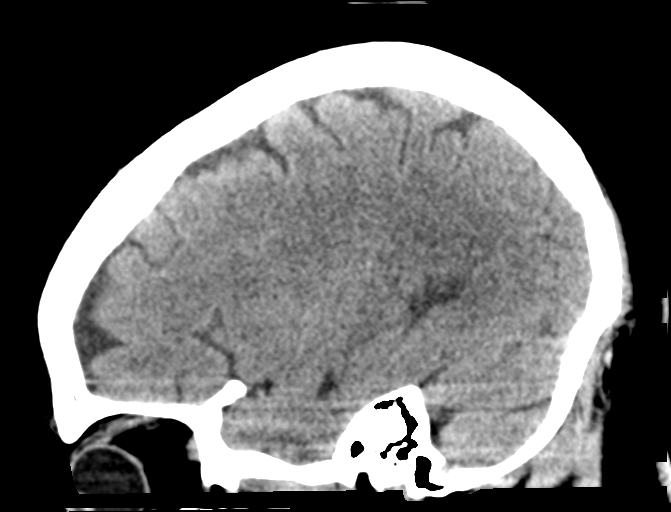
[im 27/54  brain]
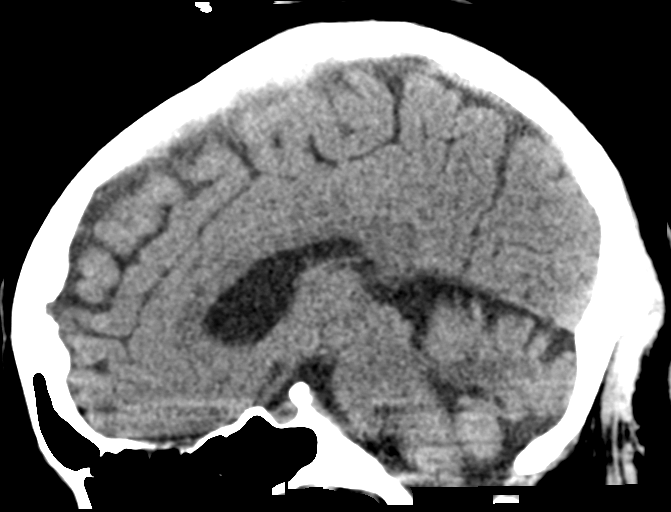
[im 36/54  brain]
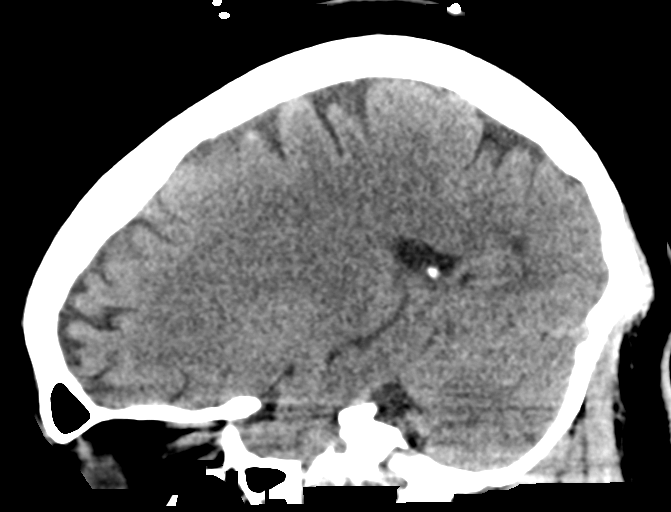

[15 of 47 positions shown; findings below may reference images not displayed]

FINDINGS: BRAIN: The ventricles and sulci are normal. No intraparenchymal
hemorrhage, mass effect nor midline shift. No acute large vascular
territory infarcts. Grey-white matter distinction is maintained. The
basal ganglia are unremarkable. No abnormal extra-axial fluid
collections. Basal cisterns are not effaced and midline. The
brainstem and cerebellar hemispheres are without acute
abnormalities.

VASCULAR: Unremarkable.

SKULL/SOFT TISSUES: No skull fracture. No significant soft tissue
swelling. Parasagittal forehead scar is believed to account for the
subtle soft tissue hyperdensity seen previously.

ORBITS/SINUSES: The included ocular globes and orbital contents are
normal.The mastoid air cells are clear. The included paranasal
sinuses are well-aerated.

OTHER: None.
IMPRESSION: No acute intracranial abnormality.

## 2019-10-24 IMAGING — DX DG CHEST 1V PORT
1 series · 1 of 1 positions shown · non-contrast
Comparison: 10/07/2012

CLINICAL DATA: NG placement

EXAM:
PORTABLE CHEST 1 VIEW

[chest ap]
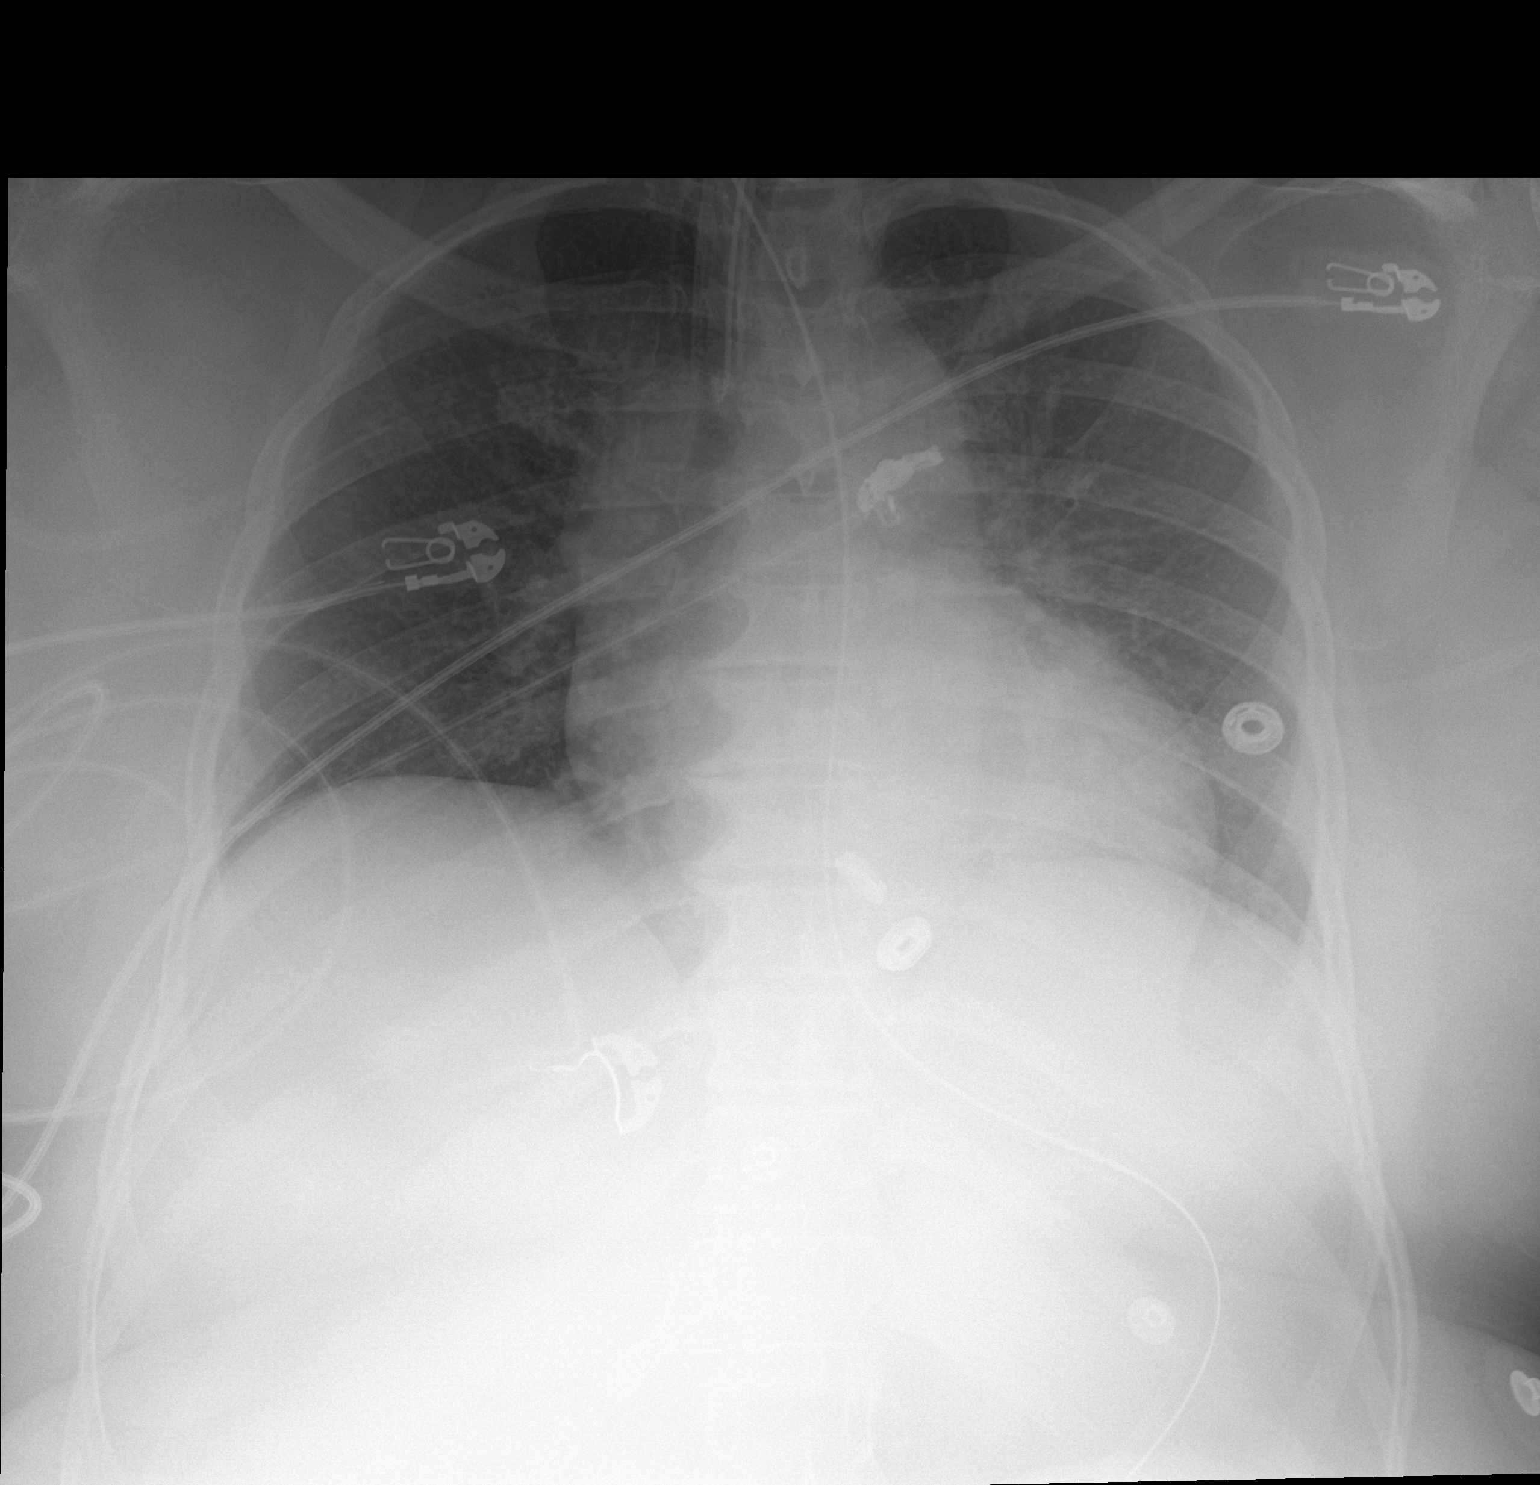

[1 of 1 positions shown; findings below may reference images not displayed]

FINDINGS: Endotracheal tube in good position.  NG tube in the stomach.

Cardiac enlargement with vascular congestion. Mild perihilar
airspace disease left greater than right likely due to mild edema.
No effusion
IMPRESSION: Endotracheal tube in good position.  NG tube in the stomach

Congestive heart failure with mild edema left greater than right.

## 2019-11-07 IMAGING — DX DG ABD PORTABLE 1V
1 series · 1 of 1 positions shown · non-contrast
Comparison: CT 04/13/2017

CLINICAL DATA: Abdominal pain. Vomiting and diarrhea. Abdominal
tenderness.

EXAM:
PORTABLE ABDOMEN - 1 VIEW

[abdomen kub]
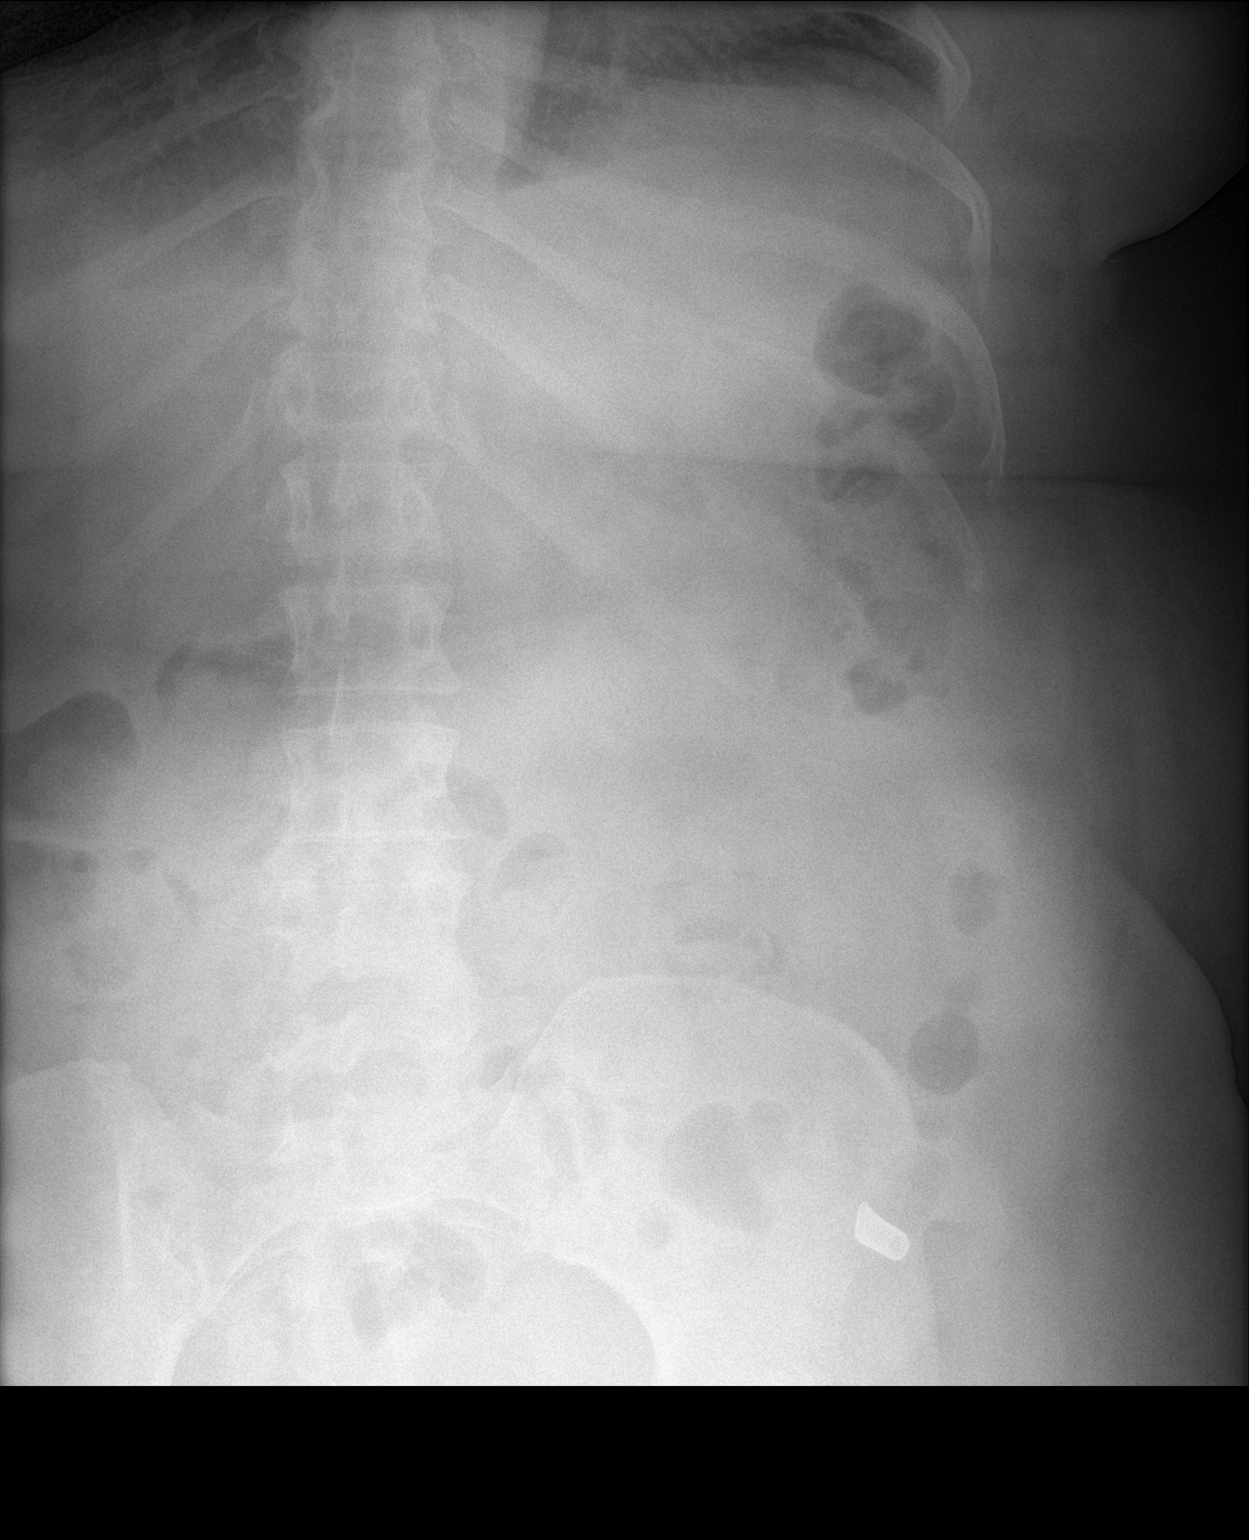

[1 of 1 positions shown; findings below may reference images not displayed]

FINDINGS: No bowel dilatation to suggest obstruction. No evidence of free air
on single supine view. Right lateral abdomen is excluded from the
field of view.
IMPRESSION: Nonobstructive bowel gas pattern. Right lateral abdomen excluded
from the field of view.
